# Patient Record
Sex: Female | Born: 1990 | Race: White | Hispanic: Yes | Smoking: Current some day smoker
Health system: Southern US, Community
[De-identification: ages and names within clinical notes are randomized; demographics above are authoritative.]

## PROBLEM LIST (undated history)

## (undated) DIAGNOSIS — N390 Urinary tract infection, site not specified: Secondary | ICD-10-CM

## (undated) DIAGNOSIS — F32A Depression, unspecified: Secondary | ICD-10-CM

## (undated) DIAGNOSIS — Z862 Personal history of diseases of the blood and blood-forming organs and certain disorders involving the immune mechanism: Secondary | ICD-10-CM

## (undated) DIAGNOSIS — F4310 Post-traumatic stress disorder, unspecified: Secondary | ICD-10-CM

## (undated) DIAGNOSIS — K5900 Constipation, unspecified: Secondary | ICD-10-CM

## (undated) DIAGNOSIS — D689 Coagulation defect, unspecified: Secondary | ICD-10-CM

## (undated) DIAGNOSIS — I2510 Atherosclerotic heart disease of native coronary artery without angina pectoris: Secondary | ICD-10-CM

## (undated) DIAGNOSIS — F329 Major depressive disorder, single episode, unspecified: Secondary | ICD-10-CM

## (undated) DIAGNOSIS — Z720 Tobacco use: Secondary | ICD-10-CM

## (undated) DIAGNOSIS — I513 Intracardiac thrombosis, not elsewhere classified: Secondary | ICD-10-CM

## (undated) DIAGNOSIS — J45909 Unspecified asthma, uncomplicated: Secondary | ICD-10-CM

## (undated) HISTORY — PX: CARDIAC SURGERY: SHX584

## (undated) HISTORY — PX: CHOLECYSTECTOMY: SHX55

## (undated) HISTORY — PX: OTHER SURGICAL HISTORY: SHX169

---
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
## 2010-06-06 ENCOUNTER — Ambulatory Visit: Payer: Self-pay | Admitting: Obstetrics & Gynecology

## 2010-06-07 ENCOUNTER — Encounter: Payer: Self-pay | Admitting: Obstetrics & Gynecology

## 2010-06-07 LAB — CONVERTED CEMR LAB: GC Probe Amp, Genital: NEGATIVE

## 2010-06-15 ENCOUNTER — Ambulatory Visit: Payer: Self-pay | Admitting: Physician Assistant

## 2010-07-20 ENCOUNTER — Ambulatory Visit: Payer: Self-pay | Admitting: Physician Assistant

## 2010-12-21 ENCOUNTER — Other Ambulatory Visit
Admission: RE | Admit: 2010-12-21 | Discharge: 2010-12-21 | Payer: Self-pay | Source: Home / Self Care | Admitting: Obstetrics & Gynecology

## 2010-12-21 ENCOUNTER — Ambulatory Visit
Admission: RE | Admit: 2010-12-21 | Discharge: 2010-12-21 | Payer: Self-pay | Source: Home / Self Care | Attending: Family | Admitting: Family

## 2011-01-02 ENCOUNTER — Ambulatory Visit: Admit: 2011-01-02 | Payer: Self-pay | Admitting: Obstetrics & Gynecology

## 2011-02-04 ENCOUNTER — Ambulatory Visit: Payer: Medicaid Other

## 2011-02-04 ENCOUNTER — Encounter: Payer: Self-pay | Admitting: Physician Assistant

## 2011-02-04 DIAGNOSIS — E282 Polycystic ovarian syndrome: Secondary | ICD-10-CM

## 2011-02-04 DIAGNOSIS — Z30432 Encounter for removal of intrauterine contraceptive device: Secondary | ICD-10-CM

## 2011-02-04 DIAGNOSIS — Z30431 Encounter for routine checking of intrauterine contraceptive device: Secondary | ICD-10-CM

## 2011-02-04 LAB — CONVERTED CEMR LAB
Clue Cells Wet Prep HPF POC: NONE SEEN
Trich, Wet Prep: NONE SEEN

## 2011-02-05 ENCOUNTER — Encounter: Payer: Self-pay | Admitting: Physician Assistant

## 2011-02-05 LAB — CONVERTED CEMR LAB: GC Probe Amp, Genital: NEGATIVE

## 2011-02-10 ENCOUNTER — Inpatient Hospital Stay (HOSPITAL_COMMUNITY): Payer: Medicaid Other

## 2011-02-10 ENCOUNTER — Inpatient Hospital Stay (HOSPITAL_COMMUNITY)
Admission: AD | Admit: 2011-02-10 | Discharge: 2011-02-10 | Disposition: A | Discharge status: Home / Self Care | Payer: Medicaid Other | Source: Ambulatory Visit | Attending: Obstetrics and Gynecology | Admitting: Obstetrics and Gynecology

## 2011-02-10 DIAGNOSIS — N83209 Unspecified ovarian cyst, unspecified side: Secondary | ICD-10-CM

## 2011-02-10 DIAGNOSIS — R109 Unspecified abdominal pain: Secondary | ICD-10-CM

## 2011-02-10 LAB — URINALYSIS, ROUTINE W REFLEX MICROSCOPIC
Leukocytes, UA: NEGATIVE
Nitrite: NEGATIVE
Protein, ur: NEGATIVE mg/dL
Specific Gravity, Urine: >1.03 — ABNORMAL HIGH (ref 1.005–1.030)
Urobilinogen, UA: 0.2 mg/dL (ref 0.0–1.0)
pH: 5.5 (ref 5.0–8.0)

## 2011-02-10 LAB — URINE MICROSCOPIC-ADD ON

## 2011-02-18 ENCOUNTER — Ambulatory Visit: Payer: Medicaid Other

## 2011-02-18 DIAGNOSIS — E282 Polycystic ovarian syndrome: Secondary | ICD-10-CM

## 2011-02-19 ENCOUNTER — Encounter: Payer: Self-pay | Admitting: Physician Assistant

## 2011-02-19 LAB — CONVERTED CEMR LAB
Prolactin: 10 ng/mL
TSH: 1.598 microintl units/mL (ref 0.350–4.500)

## 2011-03-18 ENCOUNTER — Other Ambulatory Visit: Payer: Self-pay | Admitting: Obstetrics & Gynecology

## 2011-03-18 ENCOUNTER — Ambulatory Visit: Payer: Medicaid Other

## 2011-03-18 DIAGNOSIS — R102 Pelvic and perineal pain: Secondary | ICD-10-CM

## 2011-03-18 DIAGNOSIS — N949 Unspecified condition associated with female genital organs and menstrual cycle: Secondary | ICD-10-CM

## 2011-03-19 NOTE — Assessment & Plan Note (Signed)
[name], [name]              ACCOUNT NO.:  [id number]  MEDICAL RECORD NO.:  [id number]           PATIENT TYPE:  LOCATION:  [facility] [place]           FACILITY:  PHYSICIAN:  Maylon Cos, CNM    DATE OF BIRTH:  06/11/1991  DATE OF SERVICE:  03/18/2011                                 CLINIC NOTE  Reason for today's visit is followup and diagnosis of PCOS and complaint of worsening pain on left side.  HISTORY OF PRESENT ILLNESS:  The patient is a 20 year old [place] American female who is a gravida 1, para 1-0-0-1 who is in our office last approximately 1 month ago for followup for recent diagnosis of PCOS.  At that time of that visit, she was placed on metformin 500 mg and instructed on how to taper up to 1500 mg a day.  The patient was unable to tolerate 1500 mg and is currently taking 500 twice a day with minimal side effects.  Labs that were drawn at her last visit did show glucose intolerance and normal TSH at 1.5, normal prolactin level, normal FSH, and LH.  The patient complains that despite medications that she is still complaining of pain on her left side that is every day.  She is taking Vicoprofen twice a day every day.  She states that pain is worse with  __________.  She describes it as a sharp pain below the left going area that has an ache that radiates to her back.  She was previously evaluated for pelvic pain in maternity admissions on February 09, 2011, and was seen to have multiple follicles on her right ovary which was measuring 2.8 x 1.6 x 3.0, and her left ovary was measuring 3.3 x 2 x 2.6 and contained an oblong 1.9 cm dominant follicle versus collapsing cysts.  The patient is in much desire for cervical removal of the questionable cyst on her left ovary.  We have discussed today that these types of cyst are not likely in need of surgical intervention.  She is currently on Depo-Provera and we have discussed that the better management options could  possibly be oral contraceptive pills.  She does not desire changing from Depo-Provera to oral contraceptive pills at this time and does request to discuss other options to include surgery.  We discussed today that we will repeat a pelvic ultrasound approximately 6 weeks from the first to see if the cyst is still there or larger and have her return in approximately 1 week after the ultrasound to discuss with one of the surgeons additionally, we have discussed the method of physical therapy as a treatment modality for pelvic pain and she is amenable to this.  If the surgeon also feels that she is not a candidate for surgical intervention.  On examination, [name] is a pleasant [place] American female in no apparent distress.  Her vital signs are within normal limits.  Her pulse is 85.  Her blood pressure is 109/62.  Her weight is 212 which is actually 4 pounds less than what she was here previously approximately 4 weeks ago.  HEENT exam is grossly normal.  She is appropriate during our exam.  Her abdomen is soft and nontender.  She  does have mild discomfort in her lower left quadrant and her groin area.  She declines bimanual pelvic exam today for further assessment of ovarian enlargement.  Extremities are equal and reactive.  ASSESSMENT: 1. Pelvic pain. 2. Polycystic ovarian syndrome.  PLAN: 1. The patient should she continue her metformin as well as her carb     modified diet and increased exercise for weight loss. 2. Pelvic ultrasound next week to assess resolution or increase in     previously seen follicles and ovarian cyst. 3. The patient should follow up with one of the physicians at her next     visit to review her ultrasound.  Her complaints and other treatment     modalities.          ______________________________ Maylon Cos, CNM    SS/MEDQ  D:  03/18/2011  T:  03/19/2011  Job:  [id number]

## 2011-03-25 ENCOUNTER — Other Ambulatory Visit: Payer: Medicaid Other

## 2011-03-26 NOTE — Assessment & Plan Note (Signed)
[name], [name]              ACCOUNT NO.:  [id number]  MEDICAL RECORD NO.:  [id number]           PATIENT TYPE:  LOCATION:  [facility] [place]           FACILITY:  PHYSICIAN:  Maylon Cos, CNM    DATE OF BIRTH:  [birth year]  DATE OF SERVICE:  02/18/2011                                 CLINIC NOTE  The patient is being seen at [facility] [place].  REASON FOR TODAY'S VISIT:  Followup for diagnosis of PCOS.  HISTORY OF PRESENT ILLNESS:  The patient is a 20 year old gravida 1, para 1-0-0-1 who was in our office on February 04, 2011, for an IUD removal.  At the time of her visit, she had a pelvic ultrasound for placement of her IUD prior to this removal and it was discovered that she had at that time multiple follicles on her right ovary and a fluid- filled cyst on her left ovary.  The patient subsequently decided that she started having irregular bleeding and pelvic pain with the Mirena IUD, it was removed on February 04, 2011, and she received initial shot of Depo-Provera.  The patient followed up in Maternity Admissions at [facility] on February 10, 2011, with complaints of abdominal pain. She had a pelvic ultrasound at that visit, which also identified right ovary containing multiple follicles and a left ovary that was enlarged with collapsing cysts.  She returns today for followup of previously discussed diagnosis of suspected polycystic ovarian syndrome.  She states that she is having continued abdominal pain that is relieved mildly by her combination of Vicodin and ibuprofen together.  She is not having any abnormal bleeding.  She does complain of having increased moodiness and emotional sensitivity since receiving her Depo-Provera.  PHYSICAL EXAMINATION:  [name] is a pleasant [place] American female who appears to be her stated age.  HEENT exam is grossly normal.  Positive for hirsutism on her chin.  Vital signs are stable; pulse 80,  blood pressure is 106/66, her weight is 215, and her height is 62-1/2 inches. Neuro grossly intact and appropriate during discussion and examination.  ASSESSMENT:  Probable polycystic ovarian syndrome.  PLAN: 1. I will draw the labs to evaluate for PCOS today; TSH, FSH, LH,     prolactin, and we are getting a random blood sugar. 2. I have discussed treatment options with this patient.  She does     desire to start metformin starting 500 mg p.o. at bedtime x7 days     then increasing to 500 mg p.o. twice a day x7 days with the goal of     increasing to 500 mg t.i.d. for 1500 mg daily dose.  She has been     instructed about possible side effects with the metformin.  She is     also given written literature concerning PCOS and diabetic-type     diet.  FOLLOWUP:  The patient should follow up in to 3 months for revisit or p.r.n. problems.          ______________________________ Maylon Cos, CNM    SS/MEDQ  D:  02/18/2011  T:  02/19/2011  Job:  [id number]

## 2011-04-02 ENCOUNTER — Ambulatory Visit: Payer: Medicaid Other | Admitting: Obstetrics & Gynecology

## 2011-04-02 DIAGNOSIS — N83209 Unspecified ovarian cyst, unspecified side: Secondary | ICD-10-CM

## 2011-04-02 DIAGNOSIS — E282 Polycystic ovarian syndrome: Secondary | ICD-10-CM

## 2011-04-04 NOTE — Assessment & Plan Note (Signed)
[name], [name]              ACCOUNT NO.:  [id number]  MEDICAL RECORD NO.:  [id number]           PATIENT TYPE:  LOCATION:  [facility] [place]           FACILITY:  PHYSICIAN:  Barbara Lincoln, MD      DATE OF BIRTH:  [birth year]  DATE OF SERVICE:  04/02/2011                                 CLINIC NOTE  The patient is a 20 year old female with PCOS who wants to discuss options to protect her endometrium and protect from developing cysts. She did have an ultrasound done at [facility] [place] on February 10, 2011, which showed normal uterus, anteverted, endometrial lining was 4 mm, the right ovary contained multiple follicles but no cyst or mass, left ovary contained an oblong 1.9 x 1.5 x 1.9 dominant follicle versus collapsing cyst.  This is a normal pelvic ultrasound.  The patient has been on Mirena in the past and Depo-Provera and states she has not had any cycles.  I told her this is fine as long as she was having a medically-induced amenorrhea versus a year-long amenorrhea without medicines.  The patient does not like the way the Depo makes her feel and she is interested in going back on the birth control pills.  She was on birth control pill in the past.  She does not have a history of clots in her legs or lungs, and has no contraindication that I can see from being on estrogen-containing birth control.  We replaced her on Ortho- Cyclen and she is to start this May 21, which would be at the end of the 12 weeks of Depo.  The patient will come back in 6 months for followup.  ASSESSMENT AND PLAN:  A 20 year old female with polycystic ovarian syndrome, needing birth control. 1. Normal pelvic ultrasound. 2. Start Ortho-Cyclen on May 21, which is the end of her Depo. 3. Return to clinic in 6 months to evaluate bleeding profile.          ______________________________ Barbara Lincoln, MD    KL/MEDQ  D:  04/02/2011  T:  04/03/2011  Job:  [id number]

## 2011-04-23 NOTE — Assessment & Plan Note (Signed)
[name], [name]              ACCOUNT NO.:  [id number]   MEDICAL RECORD NO.:  [id number]          PATIENT TYPE:  POB   LOCATION:  [facility] [place]         FACILITY:  [facility]   PHYSICIAN:  Maylon Cos, CNM    DATE OF BIRTH:  [birth year]   DATE OF SERVICE:  07/20/2010                                  CLINIC NOTE   The patient presents to the office at [facility], reason for today's  visit is IUD string check.   HISTORY OF PRESENT ILLNESS:  The patient had a Mirena IUD placed on June 15, 2010 in our office and returns today for her normal 5-6 weeks check.  Her complaints are only continued vaginal spotting that she complains  her state rather that it is a pink in nature, does not happen everyday,  but most of the days of other week.  It does require her wear a pad or  panty line and she would like for that.  She denies pain with  intercourse, abdominal pain or bleeding after intercourse.  Overall, she  is very happy with the Mirena.   PHYSICAL EXAMINATION:  GENERAL:  [name] is a pleasant [place] female in no  apparent distress.  VITAL SIGNS:  Stable.  Pulse is 85, blood pressure is 99/60.  Her weight  is 202.  Her height is 62 inches.  ABDOMEN:  Soft and nontender.  No masses or lesions noted.  PELVIC:  IUD strings are easily visualized.  On speculum exam, there is  a scant amount of creamy brown discharge noted at the os and in the  vaginal vault.  Bimanual exam, no cervical motion tenderness.  Uterus is  not enlarged and nontender.  Adnexa are not enlarged and nontender.  EXTREMITIES:  Reactive, full range of motion without edema.   ASSESSMENT:  1. Surveillance of Mirena intrauterine device.  2. Dysfunctional uterine bleeding secondary to Mirena intrauterine      device.   PLAN:  1. I am given the patient 62-month sample of Ortho Tri-Cyclen Lo to aid      stopping her dysfunctional uterine bleeding related to the Mirena,      she should only need 1 month of pills.  2.  The patient needs to follow up in January for annual exam and she      will then be 19 to initiate Pap smears or the patient can return      p.r.n. problems.           ______________________________  Maylon Cos, CNM     SS/MEDQ  D:  07/20/2010  T:  07/20/2010  Job:  [id number]

## 2011-04-23 NOTE — Assessment & Plan Note (Signed)
[name], [name]              ACCOUNT NO.:  [id number]   MEDICAL RECORD NO.:  [id number]          PATIENT TYPE:  POB   LOCATION:  [facility] [place]         FACILITY:  [facility]   PHYSICIAN:  Sid Falcon, CNM  DATE OF BIRTH:  13-Oct-1991   DATE OF SERVICE:                                  CLINIC NOTE   REASON FOR VISIT:  Well-woman exam.   HISTORY OF PRESENT ILLNESS:  The patient is here with reports of  desiring well-woman exam.  Reports positive gonorrhea in December 2011,  treated at [facility].  Obtained the infection by previous  partner and did not have sex with him after treatment, with a new  partner at this time.  No new medical diagnosis of family members.  The  patient here also with reports of partner is feeling the IUD strings  during intercourse.   PHYSICAL EXAMINATION:  VITAL SIGNS:  Stable, pulse 76, blood pressure  104/64, and weight 216.  GENERAL:  The patient is alert and oriented x3.  No signs of acute  distress.  NECK:  No thyromegaly.  No dominant masses, nontender with palpation.  CHEST:  Cardiovascular system regular rate and rhythm without murmurs,  gallops, or rubs.  LUNGS:  Clear to auscultation bilaterally.  BREASTS:  Soft, nontender, no dominant masses.  No nipple discharge.  No  retractions.  No dimpling bilaterally.  ABDOMEN:  Obese.  PELVIS:  Vagina, no abnormal discharge.  No abnormal lesions noted.  Cervix is slightly yellowish type discharge seen from os.  Cervical  motion tenderness is negative.  No abnormal lesions.  Uterus and adnexa  difficult to palpate secondary to weight.  IUD strings seen without  difficulty.  Strings cut approximately 1 cm x2.  Pelvic ultrasound, IUD  seen in place.   ASSESSMENT:  1. Well-woman exam.  2. Intrauterine device maintenance surveillance.   PLAN:  Send Chlamydia and gonorrhea and Pap smear to lab.  The patient  advised that the IUD is in normal position and if she is continue to  have  discomfort during intercourse to follow up with the office.      Sid Falcon, CNM     WM/MEDQ  D:  12/21/2010  T:  12/22/2010  Job:  [id number]

## 2011-04-23 NOTE — Assessment & Plan Note (Signed)
[name], [name]              ACCOUNT NO.:  [id number]   MEDICAL RECORD NO.:  [id number]          PATIENT TYPE:  POB   LOCATION:  [facility] [place]         FACILITY:  [facility]   PHYSICIAN:  Maylon Cos, CNM    DATE OF BIRTH:  06-28-1991   DATE OF SERVICE:  06/15/2010                                  CLINIC NOTE   The patient is being seen at the [facility],  [place].   REASON FOR TODAY'S VISIT:  Mirena IUD insertion.   HISTORY OF PRESENT ILLNESS:  The patient is a 20 year old single [place]-  American G1, P 1-0-60-73 with a 35-year-old daughter.  She comes today for  IUD insertion approximately 1-2 weeks after her counseling with Dr.  Marice Potter.  She is not currently in a sexual relationship.  However, she has  no desire for fertility at this time.  Gonorrhea and Chlamydia genital  probes that were done on June 29 are both negative.  Mirena IUD consent  was discussed and signed with the patient and she was given 800 mg of  ibuprofen p.o. prior to the procedure.   Physical exam, Chesney is a pleasant [place] female who appears to be in no  apparent distress.  She appears to be older than her stated age of 23.  Her vital signs are stable.  Her pulse is 78.  Her blood pressure is  114/72.  Her weight is 193.  Her height is 62-1/2 inches.   PROCEDURE:  Grave speculum was inserted inside the vagina to visualize  the cervix which is parous and a scant amount of cervical mucus  discharge was removed.  Cervix was then cleansed twice using Betadine on  Fox swab.  Tenaculum was placed at approximately 2 o'clock on the  cervix.  Uterus sounded to 8 cm.  Mirena IUD was calibrated to such and  the Mirena was placed without difficulty.  Strings were trimmed to  approximately 1.5 cm below the external os and the tenaculum was removed  from the cervix.  Bleeding was controlled with a Fox swab from the side  of the tenaculum and the speculum was removed from vagina.  The  patient  tolerated this procedure well.   ASSESSMENT:  Mirena intrauterine device insertion.   PLAN:  Mirena IUD side effects reviewed again with the patient prior to  leaving office, expected early spotting side effects reviewed again.  Encouraged the patient to take ibuprofen up to 800 mg q.8 h. p.r.n. as  needed for cramping for the next 2-3 days after insertion.  The patient  should follow up in 5-6 weeks or with the next period for IUD check or  p.r.n. problems.           ______________________________  Maylon Cos, CNM     SS/MEDQ  D:  06/15/2010  T:  06/15/2010  Job:  [id number]

## 2011-04-23 NOTE — Assessment & Plan Note (Signed)
NAMEYANNIS, GUMBS              ACCOUNT NO.:  [id number]   MEDICAL RECORD NO.:  [id number]          PATIENT TYPE:  POB   LOCATION:  [facility] [place]         FACILITY:  [facility]   PHYSICIAN:  Allie Bossier, MD        DATE OF BIRTH:  09-14-1991   DATE OF SERVICE:                                  CLINIC NOTE   Ms. Hagenow is a 20 year old single [place] American who is a G1, P15 with  a 42-year-old daughter, who comes in here to discuss birth control  options and she desires testing for GC and Chlamydia.  She says she has  had a vaginal discharge that occurs just before her period.  She said  she has severe dysmenorrhea and has to take Motrin for that.  Review of  systems is significant for coitarche being at age 3, 67 partners since  that time.  She works at [employer].  She does use condoms regularly.  She  says she bleeds 5-7 days per month and has donated blood recently in  January 2011.  On physical exam, her cervix is parous.  She has normal  discharge.  No cervical motion tenderness.  Her uterus is upper limits  of normal for size, it is midplane, nontender and adnexa are nontender.  No masses.   ASSESSMENT AND PLAN:  1. Desires sexually transmitted disease check.  I have done GC and      Chlamydia today.  I have offered her blood-borne screening and she      declines that today.  2. Severe dysmenorrhea and heavy periods and desire for birth control.      After discussion of the option, she would like a Mirena.  A handout      has been given on Mirena.  She will schedule this for next week.  I      have instructed her to not have sex till then (she is not currently      in a relationship) and we will see her back in next week.      Allie Bossier, MD     MCD/MEDQ  D:  06/06/2010  T:  06/07/2010  Job:  [id number]
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
## 2011-05-03 NOTE — Assessment & Plan Note (Signed)
[name], [name]              ACCOUNT NO.:  [id number]  MEDICAL RECORD NO.:  [id number]          PATIENT TYPE:  POB  LOCATION:  [facility] [place]         FACILITY:  [facility]  PHYSICIAN:  Maylon Cos, CNM    DATE OF BIRTH:  1991-03-25  DATE OF SERVICE:  02/04/2011                                 CLINIC NOTE  REASON FOR TODAY'S VISIT:  IUD check.  HISTORY OF PRESENT ILLNESS:  The patient is a 20 year old [place] American gravida 1, para 1-0-0-1 who had a Mirena IUD placed in July 2011 for contraception.  She returns today with continued complaints of intermittent left-sided pain that she describes as sharp in nature.  She is taking ibuprofen 600 mg on a regular basis when she has this pain that does not relieve her discomfort.  She has also had ongoing complaints of irregular bleeding with her IUD.  She was given a pack of Ortho Tri-Cyclen Lo in August 2011, to help with dysfunctional bleeding related to the Mirena.  She took 1 pack of pills with short-term resolution.  She was seen for well-woman exam in January 2012, still complaining of intermittent pain and abnormal bleeding.  Pelvic ultrasound was performed at that time to confirm normal IUD positioning and strings were also trimmed at that visit.  She returns today, saying that she had a first ever last menstrual period was approximately 2 weeks ago, it lasted for 2 weeks.  She had intermittent cramping and sharp pain, mainly on her left side, again unrelieved by ibuprofen.  She presents today for IUD check and possible removal.  PHYSICAL EXAMINATION:  GENERAL:  [name] is a pleasant 20 year old, in no apparent distress. She has an obese abdomen and positive for no apparent facial hair or hirsutism on general exam. VITAL SIGNS:  Stable.  Her pulse is 82, her blood pressure is 122/76, her weight is 216, her height is 62-1/2 inches. PELVIC:  IUD strings are easily visualized using Graves speculum.  Her cervix is parous.  It  is nonfriable.  Bimanual exam does reveal nonenlarged uterus, enlarged left adnexal with positive tenderness to palpation.  Right ovary and adnexa, does have mild tenderness to palpation but no enlargement is appreciated.  Pelvic ultrasound performed in office does reveal the right ovary to have multiple dominant follicles and a left ovary to have a fluid-filled cyst, measuring 1.5x 1.6 cm.  Based on the findings of the today's ultrasound and examination, the patient does state that she desires to have her IUD removed that was performed during the examination, that was removed without difficulty intact.  ASSESSMENT: 1. Intrauterine device removal. 2. Likely polycystic ovarian syndrome. 3. Contraceptive management. 4. Dysfunctional uterine bleeding.  PLAN: 1. IUD was removed during today's visit as described above without     difficulty. 2. I discussed with the patient findings of her ultrasound today and     given her clinical picture, she likely has a diagnosis of     polycystic ovarian syndrome.  I have given her written literature     on this and also diabetic diet and treatment method.  The patient     is unable to afford lab work today given that she has family  planning Medicaid.  I have discussed with her at length treatment     options and possible need for metformin in the future.  She is to     review the literature given today and will return in 2 weeks for     further evaluation. 3. The patient will receive Depo-Provera 150 mg IM today for ongoing     contraception and possible treatment of her dysfunctional uterine     bleeding.  The patient is to return in 2 weeks for further     discussion of diagnosis of polycystic ovarian syndrome.  She is     encouraged diet and exercise to include diabetic diet and will     consider metformin in the future when she is not on Depo-Provera.          ______________________________ Maylon Cos, CNM    SS/MEDQ  D:   02/04/2011  T:  02/05/2011  Job:  [id number]

## 2011-07-12 ENCOUNTER — Ambulatory Visit: Payer: Medicaid Other

## 2011-07-12 DIAGNOSIS — N949 Unspecified condition associated with female genital organs and menstrual cycle: Secondary | ICD-10-CM

## 2011-10-23 DIAGNOSIS — I82402 Acute embolism and thrombosis of unspecified deep veins of left lower extremity: Secondary | ICD-10-CM | POA: Insufficient documentation

## 2011-10-23 DIAGNOSIS — Z86718 Personal history of other venous thrombosis and embolism: Secondary | ICD-10-CM | POA: Insufficient documentation

## 2012-12-09 DIAGNOSIS — I513 Intracardiac thrombosis, not elsewhere classified: Secondary | ICD-10-CM

## 2012-12-09 HISTORY — DX: Intracardiac thrombosis, not elsewhere classified: I51.3

## 2012-12-09 HISTORY — PX: CARDIAC SURGERY: SHX584

## 2013-03-23 DIAGNOSIS — Z8742 Personal history of other diseases of the female genital tract: Secondary | ICD-10-CM | POA: Insufficient documentation

## 2013-05-04 DIAGNOSIS — R002 Palpitations: Secondary | ICD-10-CM | POA: Insufficient documentation

## 2013-09-24 DIAGNOSIS — K8020 Calculus of gallbladder without cholecystitis without obstruction: Secondary | ICD-10-CM

## 2013-09-24 HISTORY — DX: Calculus of gallbladder without cholecystitis without obstruction: K80.20

## 2013-11-29 DIAGNOSIS — I82409 Acute embolism and thrombosis of unspecified deep veins of unspecified lower extremity: Secondary | ICD-10-CM | POA: Insufficient documentation

## 2014-02-10 DIAGNOSIS — Z5181 Encounter for therapeutic drug level monitoring: Secondary | ICD-10-CM | POA: Insufficient documentation

## 2014-07-20 DIAGNOSIS — E282 Polycystic ovarian syndrome: Secondary | ICD-10-CM | POA: Insufficient documentation

## 2014-07-20 DIAGNOSIS — G43909 Migraine, unspecified, not intractable, without status migrainosus: Secondary | ICD-10-CM | POA: Insufficient documentation

## 2014-12-06 DIAGNOSIS — D75839 Thrombocytosis, unspecified: Secondary | ICD-10-CM | POA: Insufficient documentation

## 2014-12-09 DIAGNOSIS — Q2112 Patent foramen ovale: Secondary | ICD-10-CM

## 2014-12-09 HISTORY — DX: Patent foramen ovale: Q21.12

## 2015-10-10 DIAGNOSIS — W540XXA Bitten by dog, initial encounter: Secondary | ICD-10-CM

## 2015-10-10 HISTORY — DX: Bitten by dog, initial encounter: W54.0XXA

## 2016-04-16 ENCOUNTER — Encounter: Payer: Self-pay | Admitting: Emergency Medicine

## 2016-04-16 ENCOUNTER — Emergency Department (INDEPENDENT_AMBULATORY_CARE_PROVIDER_SITE_OTHER)
Admission: EM | Admit: 2016-04-16 | Discharge: 2016-04-16 | Disposition: A | Discharge status: Home / Self Care | Payer: Self-pay | Source: Home / Self Care | Attending: Emergency Medicine | Admitting: Emergency Medicine

## 2016-04-16 DIAGNOSIS — L600 Ingrowing nail: Secondary | ICD-10-CM

## 2016-04-16 MED ORDER — OXYCODONE-ACETAMINOPHEN 5-325 MG PO TABS: 2.0000 | ORAL_TABLET | ORAL | Status: DC | PRN

## 2016-04-16 MED ORDER — SULFAMETHOXAZOLE-TRIMETHOPRIM 800-160 MG PO TABS: 1.0000 | ORAL_TABLET | Freq: Two times a day (BID) | ORAL | Status: AC

## 2016-04-16 NOTE — ED Notes (Signed)
Pt c/o ingrown toenail in right big toe. Red and painful.

## 2016-04-16 NOTE — Discharge Instructions (Signed)
Ingrown Toenail  An ingrown toenail occurs when the corner or sides of your toenail grow into the surrounding skin. The big toe is most commonly affected, but it can happen to any of your toes. If your ingrown toenail is not treated, you will be at risk for infection.  CAUSES  This condition may be caused by:  · Wearing shoes that are too small or tight.  · Injury or trauma, such as stubbing your toe or having your toe stepped on.  · Improper cutting or care of your toenails.  · Being born with (congenital) nail or foot abnormalities, such as having a nail that is too big for your toe.  RISK FACTORS  Risk factors for an ingrown toenail include:  · Age. Your nails tend to thicken as you get older, so ingrown nails are more common in older people.  · Diabetes.  · Cutting your toenails incorrectly.  · Blood circulation problems.  SYMPTOMS  Symptoms may include:  · Pain, soreness, or tenderness.  · Redness.  · Swelling.  · Hardening of the skin surrounding the toe.  Your ingrown toenail may be infected if there is fluid, pus, or drainage.  DIAGNOSIS   An ingrown toenail may be diagnosed by medical history and physical exam. If your toenail is infected, your health care provider may test a sample of the drainage.  TREATMENT  Treatment depends on the severity of your ingrown toenail. Some ingrown toenails may be treated at home. More severe or infected ingrown toenails may require surgery to remove all or part of the nail. Infected ingrown toenails may also be treated with antibiotic medicines.  HOME CARE INSTRUCTIONS  · If you were prescribed an antibiotic medicine, finish all of it even if you start to feel better.  · Soak your foot in warm soapy water for 20 minutes, 3 times per day or as directed by your health care provider.  · Carefully lift the edge of the nail away from the sore skin by wedging a small piece of cotton under the corner of the nail. This may help with the pain.  Be careful not to cause more injury  to the area.  · Wear shoes that fit well. If your ingrown toenail is causing you pain, try wearing sandals, if possible.  · Trim your toenails regularly and carefully. Do not cut them in a curved shape. Cut your toenails straight across. This prevents injury to the skin at the corners of the toenail.  · Keep your feet clean and dry.  · If you are having trouble walking and are given crutches by your health care provider, use them as directed.  · Do not pick at your toenail or try to remove it yourself.  · Take medicines only as directed by your health care provider.  · Keep all follow-up visits as directed by your health care provider. This is important.  SEEK MEDICAL CARE IF:  · Your symptoms do not improve with treatment.  SEEK IMMEDIATE MEDICAL CARE IF:  · You have red streaks that start at your foot and go up your leg.  · You have a fever.  · You have increased redness, swelling, or pain.  · You have fluid, blood, or pus coming from your toenail.     This information is not intended to replace advice given to you by your health care provider. Make sure you discuss any questions you have with your health care provider.     Document Released:   11/22/2000 Document Revised: 04/11/2015 Document Reviewed: 10/19/2014  Elsevier Interactive Patient Education ©2016 Elsevier Inc.

## 2016-04-16 NOTE — ED Provider Notes (Signed)
CSN: [id number]     Arrival date & time 04/16/16  1953 History   First MD Initiated Contact with Patient 04/16/16 2001     Chief Complaint  Patient presents with  . Ingrown Toenail   (Consider location/radiation/quality/duration/timing/severity/associated sxs/prior Treatment) Patient is a 25 y.o. female presenting with toe pain. The history is provided by the patient. No language interpreter was used.  Toe Pain This is a new problem. The current episode started more than 2 days ago. The problem occurs constantly. The problem has been gradually worsening. Associated symptoms comments: swelling. Nothing aggravates the symptoms. Nothing relieves the symptoms. The treatment provided no relief.  Pt complains of ingrown nail with swelling and drainage.  Pt has had procedures on both toenail   History reviewed. No pertinent past medical history. History reviewed. No pertinent past surgical history. History reviewed. No pertinent family history. Social History  Substance Use Topics  . Smoking status: Former Games developermoker  . Smokeless tobacco: None  . Alcohol Use: No   OB History    No data available     Review of Systems  All other systems reviewed and are negative.   Allergies  Morphine and related; Topamax; Toradol; and Vicodin  Home Medications   Prior to Admission medications   Medication Sig Start Date End Date Taking? Authorizing Provider  oxyCODONE-acetaminophen (PERCOCET/ROXICET) 5-325 MG tablet Take 2 tablets by mouth every 4 (four) hours as needed for severe pain. 04/16/16   Elson AreasLeslie K Sofia, PA-C  sulfamethoxazole-trimethoprim (BACTRIM DS,SEPTRA DS) 800-160 MG tablet Take 1 tablet by mouth 2 (two) times daily. 04/16/16 04/23/16  Elson AreasLeslie K Sofia, PA-C   Meds Ordered and Administered this Visit  Medications - No data to display  BP 116/82 mmHg  Pulse 96  Temp(Src) 98.3 F (36.8 C) (Oral)  Wt 233 lb (105.688 kg)  SpO2 96% No data found.   Physical Exam  Constitutional: She is  oriented to person, place, and time. She appears well-developed and well-nourished.  Musculoskeletal: She exhibits tenderness.  Right 1st toe, erythema, swelling,  Small amount of drainage.     Neurological: She is alert and oriented to person, place, and time. She has normal reflexes.  Skin: Skin is warm.  Psychiatric: She has a normal mood and affect.  Nursing note and vitals reviewed.   ED Course  Procedures (including critical care time)  Labs Review Labs Reviewed - No data to display  Imaging Review No results found.   Visual Acuity Review  Right Eye Distance:   Left Eye Distance:   Bilateral Distance:    Right Eye Near:   Left Eye Near:    Bilateral Near:         MDM   1. Ingrown right big toenail    Meds ordered this encounter  Medications  . oxyCODONE-acetaminophen (PERCOCET/ROXICET) 5-325 MG tablet    Sig: Take 2 tablets by mouth every 4 (four) hours as needed for severe pain.    Dispense:  12 tablet    Refill:  0    Order Specific Question:  Supervising Provider    Answer:  Georgina PillionMASSEY, DAVID [[id number]]  . sulfamethoxazole-trimethoprim (BACTRIM DS,SEPTRA DS) 800-160 MG tablet    Sig: Take 1 tablet by mouth 2 (two) times daily.    Dispense:  14 tablet    Refill:  0    Order Specific Question:  Supervising Provider    Answer:  Georgina PillionMASSEY, DAVID [[id number]]  An After Visit Summary was printed and given to the patient.  Lonia Skinner Deering, PA-C 04/16/16 2021

## 2016-07-07 ENCOUNTER — Encounter (HOSPITAL_COMMUNITY): Payer: Self-pay | Admitting: Emergency Medicine

## 2016-07-07 ENCOUNTER — Inpatient Hospital Stay (HOSPITAL_COMMUNITY)
Admission: EM | Admit: 2016-07-07 | Discharge: 2016-07-10 | DRG: 313 | Disposition: A | Discharge status: Home / Self Care | Payer: Self-pay | Attending: Internal Medicine | Admitting: Internal Medicine

## 2016-07-07 ENCOUNTER — Emergency Department (HOSPITAL_COMMUNITY): Payer: Self-pay

## 2016-07-07 DIAGNOSIS — Z881 Allergy status to other antibiotic agents status: Secondary | ICD-10-CM

## 2016-07-07 DIAGNOSIS — F4310 Post-traumatic stress disorder, unspecified: Secondary | ICD-10-CM

## 2016-07-07 DIAGNOSIS — Z6841 Body Mass Index (BMI) 40.0 and over, adult: Secondary | ICD-10-CM

## 2016-07-07 DIAGNOSIS — Z885 Allergy status to narcotic agent status: Secondary | ICD-10-CM

## 2016-07-07 DIAGNOSIS — Z86718 Personal history of other venous thrombosis and embolism: Secondary | ICD-10-CM

## 2016-07-07 DIAGNOSIS — R200 Anesthesia of skin: Secondary | ICD-10-CM

## 2016-07-07 DIAGNOSIS — I10 Essential (primary) hypertension: Secondary | ICD-10-CM | POA: Diagnosis present

## 2016-07-07 DIAGNOSIS — Z886 Allergy status to analgesic agent status: Secondary | ICD-10-CM

## 2016-07-07 DIAGNOSIS — Z888 Allergy status to other drugs, medicaments and biological substances status: Secondary | ICD-10-CM

## 2016-07-07 DIAGNOSIS — F329 Major depressive disorder, single episode, unspecified: Secondary | ICD-10-CM | POA: Diagnosis present

## 2016-07-07 DIAGNOSIS — F17210 Nicotine dependence, cigarettes, uncomplicated: Secondary | ICD-10-CM | POA: Diagnosis present

## 2016-07-07 DIAGNOSIS — R0789 Other chest pain: Secondary | ICD-10-CM

## 2016-07-07 DIAGNOSIS — I513 Intracardiac thrombosis, not elsewhere classified: Secondary | ICD-10-CM

## 2016-07-07 DIAGNOSIS — Z79899 Other long term (current) drug therapy: Secondary | ICD-10-CM

## 2016-07-07 DIAGNOSIS — D689 Coagulation defect, unspecified: Secondary | ICD-10-CM | POA: Diagnosis present

## 2016-07-07 DIAGNOSIS — G459 Transient cerebral ischemic attack, unspecified: Secondary | ICD-10-CM

## 2016-07-07 DIAGNOSIS — I213 ST elevation (STEMI) myocardial infarction of unspecified site: Secondary | ICD-10-CM

## 2016-07-07 DIAGNOSIS — Z9119 Patient's noncompliance with other medical treatment and regimen: Secondary | ICD-10-CM

## 2016-07-07 DIAGNOSIS — E785 Hyperlipidemia, unspecified: Secondary | ICD-10-CM | POA: Diagnosis present

## 2016-07-07 DIAGNOSIS — G8191 Hemiplegia, unspecified affecting right dominant side: Secondary | ICD-10-CM | POA: Diagnosis present

## 2016-07-07 DIAGNOSIS — I2510 Atherosclerotic heart disease of native coronary artery without angina pectoris: Secondary | ICD-10-CM | POA: Diagnosis present

## 2016-07-07 DIAGNOSIS — R112 Nausea with vomiting, unspecified: Secondary | ICD-10-CM | POA: Diagnosis present

## 2016-07-07 DIAGNOSIS — R079 Chest pain, unspecified: Principal | ICD-10-CM | POA: Diagnosis present

## 2016-07-07 DIAGNOSIS — F32A Depression, unspecified: Secondary | ICD-10-CM

## 2016-07-07 DIAGNOSIS — Z720 Tobacco use: Secondary | ICD-10-CM

## 2016-07-07 HISTORY — DX: Depression, unspecified: F32.A

## 2016-07-07 HISTORY — DX: Atherosclerotic heart disease of native coronary artery without angina pectoris: I25.10

## 2016-07-07 HISTORY — DX: Unspecified asthma, uncomplicated: J45.909

## 2016-07-07 HISTORY — DX: Post-traumatic stress disorder, unspecified: F43.10

## 2016-07-07 HISTORY — DX: Major depressive disorder, single episode, unspecified: F32.9

## 2016-07-07 HISTORY — DX: Tobacco use: Z72.0

## 2016-07-07 HISTORY — DX: Intracardiac thrombosis, not elsewhere classified: I51.3

## 2016-07-07 LAB — APTT: aPTT: 30 seconds (ref 24–36)

## 2016-07-07 LAB — I-STAT TROPONIN, ED
TROPONIN I, POC: 0 ng/mL (ref 0.00–0.08)
Troponin i, poc: 0 ng/mL (ref 0.00–0.08)

## 2016-07-07 LAB — CBC
HEMATOCRIT: 45.9 % (ref 36.0–46.0)
HEMOGLOBIN: 15.5 g/dL — AB (ref 12.0–15.0)
MCH: 28.9 pg (ref 26.0–34.0)
MCHC: 33.8 g/dL (ref 30.0–36.0)
MCV: 85.6 fL (ref 78.0–100.0)
Platelets: 378 10*3/uL (ref 150–400)
RBC: 5.36 MIL/uL — AB (ref 3.87–5.11)
RDW: 13.7 % (ref 11.5–15.5)
WBC: 7.7 10*3/uL (ref 4.0–10.5)

## 2016-07-07 LAB — BASIC METABOLIC PANEL
Anion gap: 10 (ref 5–15)
BUN: 6 mg/dL (ref 6–20)
CHLORIDE: 104 mmol/L (ref 101–111)
CO2: 21 mmol/L — AB (ref 22–32)
CREATININE: 0.72 mg/dL (ref 0.44–1.00)
Calcium: 8.9 mg/dL (ref 8.9–10.3)
GFR calc non Af Amer: >60 mL/min (ref >60)
Glucose, Bld: 100 mg/dL — ABNORMAL HIGH (ref 65–99)
POTASSIUM: 3.9 mmol/L (ref 3.5–5.1)
Sodium: 135 mmol/L (ref 135–145)

## 2016-07-07 LAB — I-STAT BETA HCG BLOOD, ED (MC, WL, AP ONLY)

## 2016-07-07 LAB — PROTIME-INR
INR: 1
PROTHROMBIN TIME: 13.2 s (ref 11.4–15.2)

## 2016-07-07 LAB — TROPONIN I: Troponin I: <0.03 ng/mL (ref <0.03)

## 2016-07-07 MED ORDER — ACETAMINOPHEN 325 MG PO TABS: 650.0000 mg | ORAL_TABLET | ORAL | Status: DC | PRN

## 2016-07-07 MED ORDER — PROMETHAZINE HCL 25 MG/ML IJ SOLN
25.0000 mg | Freq: Once | INTRAMUSCULAR | Status: AC
  Administered 2016-07-07: 25 mg via INTRAVENOUS
  Filled 2016-07-07: qty 1

## 2016-07-07 MED ORDER — ONDANSETRON HCL 4 MG/2ML IJ SOLN
4.0000 mg | Freq: Once | INTRAMUSCULAR | Status: AC
  Administered 2016-07-07: 4 mg via INTRAVENOUS
  Filled 2016-07-07: qty 2

## 2016-07-07 MED ORDER — OXYCODONE-ACETAMINOPHEN 5-325 MG PO TABS
2.0000 | ORAL_TABLET | ORAL | Status: DC | PRN
  Administered 2016-07-08 – 2016-07-10 (×9): 2 via ORAL
  Filled 2016-07-07 (×10): qty 2

## 2016-07-07 MED ORDER — HEPARIN (PORCINE) IN NACL 100-0.45 UNIT/ML-% IJ SOLN
1350.0000 [IU]/h | INTRAMUSCULAR | Status: DC
  Administered 2016-07-07: 1000 [IU]/h via INTRAVENOUS
  Administered 2016-07-08: 1200 [IU]/h via INTRAVENOUS
  Filled 2016-07-07 (×4): qty 250

## 2016-07-07 MED ORDER — FENTANYL CITRATE (PF) 100 MCG/2ML IJ SOLN
200.0000 ug | Freq: Once | INTRAMUSCULAR | Status: AC
  Administered 2016-07-07: 200 ug via INTRAVENOUS
  Filled 2016-07-07: qty 4

## 2016-07-07 MED ORDER — ONDANSETRON HCL 4 MG/2ML IJ SOLN
4.0000 mg | Freq: Three times a day (TID) | INTRAMUSCULAR | Status: DC | PRN
  Administered 2016-07-08 (×2): 4 mg via INTRAVENOUS
  Filled 2016-07-07 (×2): qty 2

## 2016-07-07 MED ORDER — HYDROMORPHONE HCL 1 MG/ML PO LIQD: 1.0000 mg | ORAL | Status: DC | PRN

## 2016-07-07 MED ORDER — HYDROMORPHONE HCL 2 MG PO TABS
1.0000 mg | ORAL_TABLET | ORAL | Status: DC | PRN
  Administered 2016-07-07: 1 mg via ORAL
  Filled 2016-07-07: qty 1

## 2016-07-07 MED ORDER — VILAZODONE HCL 20 MG PO TABS
20.0000 mg | ORAL_TABLET | Freq: Every day | ORAL | Status: DC
Start: 2016-07-08 — End: 2016-07-10
  Administered 2016-07-08 – 2016-07-10 (×3): 20 mg via ORAL
  Filled 2016-07-07 (×3): qty 1

## 2016-07-07 MED ORDER — IOPAMIDOL (ISOVUE-370) INJECTION 76%
INTRAVENOUS | Status: AC
  Administered 2016-07-07: 100 mL
  Filled 2016-07-07: qty 100

## 2016-07-07 MED ORDER — ALPRAZOLAM 0.25 MG PO TABS
0.2500 mg | ORAL_TABLET | Freq: Two times a day (BID) | ORAL | Status: DC | PRN
  Administered 2016-07-08: 0.25 mg via ORAL
  Filled 2016-07-07: qty 1

## 2016-07-07 MED ORDER — NITROGLYCERIN 0.4 MG SL SUBL: 0.4000 mg | SUBLINGUAL_TABLET | SUBLINGUAL | Status: DC | PRN

## 2016-07-07 MED ORDER — NICOTINE 21 MG/24HR TD PT24
21.0000 mg | MEDICATED_PATCH | Freq: Every day | TRANSDERMAL | Status: DC
  Filled 2016-07-07: qty 1

## 2016-07-07 MED ORDER — PNEUMOCOCCAL VAC POLYVALENT 25 MCG/0.5ML IJ INJ
0.5000 mL | INJECTION | INTRAMUSCULAR | Status: AC
  Administered 2016-07-08: 0.5 mL via INTRAMUSCULAR
  Filled 2016-07-07: qty 0.5

## 2016-07-07 MED ORDER — TRAZODONE HCL 100 MG PO TABS
100.0000 mg | ORAL_TABLET | Freq: Every day | ORAL | Status: DC
  Administered 2016-07-07 – 2016-07-09 (×3): 100 mg via ORAL
  Filled 2016-07-07 (×3): qty 1

## 2016-07-07 MED ORDER — STROKE: EARLY STAGES OF RECOVERY BOOK
Freq: Once | Status: AC
  Administered 2016-07-08: 11:00:00
  Filled 2016-07-07: qty 1

## 2016-07-07 NOTE — ED Notes (Signed)
Reports feeling SOB.  Sat noted to be 90-92 on RA.  Placed on 2L of oxygen at this time.  Sats rebounded to mid to upper 90's.

## 2016-07-07 NOTE — Progress Notes (Signed)
ANTICOAGULATION CONSULT NOTE - Initial Consult  Pharmacy Consult for heparin Indication: possible right atrial thrombus  Allergies  Allergen Reactions  . Fluoxetine Other (See Comments)    Pt told not to take with eliquis  . Ceftriaxone Itching and Other (See Comments)    Red man syndrome  . Gabapentin Other (See Comments)    Confusion/ chest pains  . Morphine And Related Itching and Nausea And Vomiting  . Nsaids Other (See Comments)    Pt told by hematologist not to take  . Topamax [Topiramate] Other (See Comments)    Dizziness, black out spells  . Toradol [Ketorolac Tromethamine] Itching and Other (See Comments)    Felt hot  . Vicodin [Hydrocodone-Acetaminophen] Other (See Comments)    hallucinations  . Depakote [Divalproex Sodium] Rash and Other (See Comments)    Skin felt like it was on fire  . Methocarbamol Rash  . Metoclopramide Rash and Hives    Patient Measurements: Height: 5\' 3"  (160 cm) Weight: 229 lb 3.2 oz (104 kg) IBW/kg (Calculated) : 52.4 Heparin Dosing Weight: 77  Vital Signs: Temp: 97.8 F (36.6 C) (07/30 2118) Temp Source: Oral (07/30 2118) BP: 136/78 (07/30 2118) Pulse Rate: 105 (07/30 2118)  Labs:  Recent Labs  07/07/16 1318  HGB 15.5*  HCT 45.9  PLT 378  CREATININE 0.72    Estimated Creatinine Clearance: 122.8 mL/min (by C-G formula based on SCr of 0.8 mg/dL).   Medical History: Past Medical History:  Diagnosis Date  . Asthma   . Coronary artery disease    clotting disorder  . Depression   . PTSD (post-traumatic stress disorder)   . Thrombosis of right atrium (HCC)   . Tobacco abuse    Assessment: 12 yof presents with chest pain. Patient has history of coagulapathy and atrial thrombus removed, noncompliant with anticoag d/t insurance issues.   Neurology consulted for possible recurrent transient ischemic attacks, with infarction cannot be rule out at this point. Neuro recommends "low dose heparin" so will aim for lower goal for  now in case stroke present and avoid bolus.  Troponin negative, cbc wnl  Goal of Therapy:  Heparin level 0.3-0.5 units/ml Monitor platelets by anticoagulation protocol: Yes   Plan:  Start heparin infusion at 1000 units/hr Check anti-Xa level in 6 hours and daily while on heparin Continue to monitor H&H and platelets  Sheppard Coil PharmD., BCPS Clinical Pharmacist Pager [phone] 07/07/2016 9:57 PM

## 2016-07-07 NOTE — ED Provider Notes (Signed)
Blood pressure 110/74, pulse 66, temperature 98.5 F (36.9 C), temperature source Oral, resp. rate 14, SpO2 100 %.  Assuming care from Dr. Criss Alvine.  In short, [name] is a 25 y.o. female with a chief complaint of Chest Pain .  Refer to the original H&P for additional details.  The current plan of care is to follow CTA, CT head, and second troponin. Patient will likely require admission for embolic w/u in the setting of known atrial thrombus and Eliquis non-compliance along with intermittent neuro symptoms.   07:32 PM Updated patient regarding CT scan results.   08:18 PM Discussed patient's case with hospitalist, Dr. Clyde Lundborg.  Recommend admission to obs, tele bed.  I will place holding orders per their request. Patient and family (if present) updated with plan. Care transferred to hospitalist service.  I reviewed all nursing notes, vitals, pertinent old records, EKGs, labs, imaging (as available).  Alona Bene, MD   Maia Plan, MD 07/07/16 2018

## 2016-07-07 NOTE — ED Notes (Signed)
Patient transported to CT 

## 2016-07-07 NOTE — H&P (Addendum)
History and Physical    [name] ZOX:[id number] DOB: Mar 26, 1991 DOA: 07/07/2016  Referring MD/NP/PA:   PCP: No PCP Per Patient   Patient coming from:  The patient is coming from home.  At baseline, pt is independent for most of ADL.  Chief Complaint: Chest pain, left-sided numbness and weakness  HPI: [name] is a 25 y.o. female with medical history significant of known hx of coagulopathy, right atrial thrombus with surgical removal, coronary artery disease, PTSD, depression and tobacco abuse, who presents with chest pain, left-sided numbness, weakness.  Patient had history of hyper-coagulopathy and history of right atrial thrombosis. She was initially treated with Coumadin and subsequently was switched to Eliquis. She stopped taking Eliquis since 07/2015 due to lack of insurance. She says she developed the chest pain since last night. Chest pain is located in the front chest, 9 out of 10 in severity, constant, sharp, radiating to the left arm. It is not aggravated or alleviated by any known factors. She has mild shortness of breath, but no cough, fever or chills. She does not have tenderness over calf areas.  She states that she has been having intermittent weakness and numbness in left arm and leg for about 2 -3 week. She has another episode of the left arm weakness since yesterday. Patient states that she has nausea and vomited several times, no diarrhea or abdominal pain. She has increased urinary frequency sometimes, but no burning on urination or dysuria. She does not have vision change or hearing loss.  ED Course: pt was found to have troponin negative, negative pregnancy test, WBC 7.7, temperature normal, no tachycardia, respiration rate 23, electrolytes and renal function okay,  Pending UA and urine culture, negative chest x-ray, negative CT head for acute intracranial abnormalities. Pt is placed on tele bed for obs. Neurology, Dr. Roseanne Reno was consulted.  # CT angiogram of  chest showed: No evidence of aortic dissection, aneurysm or periaortic fluid collection. No evidence of pulmonary embolus. Heterogeneous appearance of the right atrium. It is difficult to discern whether this represent mixing of opacified and non-opacified blood from the superior and inferior vena cava respectively, or right atrial clot. Postoperative changes in the anterior mediastinum with residual stable soft tissue thickening.  # CTA-abd/pelivs showed no evidence of abdominal aortic dissection, aneurysm or periaortic fluid collection. Normal appearance of the named arteries within the abdomen and pelvis. Normal appearance of the solid abdominal and pelvic organs.   Review of Systems:   General: no fevers, chills, no changes in body weight, has poor appetite, has fatigue HEENT: no blurry vision, hearing changes or sore throat Pulm: no dyspnea, coughing, wheezing CV: no chest pain, no palpitations Abd: has nausea, vomiting, no abdominal pain, diarrhea, constipation GU: no dysuria, burning on urination, increased urinary frequency, hematuria  Ext: no leg edema Neuro: has left arm and leg weakness, numbness, or tingling, no vision change or hearing loss Skin: no rash MSK: No muscle spasm, no deformity, no limitation of range of movement in spin Heme: No easy bruising.  Travel history: No recent long distant travel.  Allergy:  Allergies  Allergen Reactions  . Fluoxetine Other (See Comments)    Pt told not to take with eliquis  . Ceftriaxone Itching and Other (See Comments)    Red man syndrome  . Gabapentin Other (See Comments)    Confusion/ chest pains  . Morphine And Related Itching and Nausea And Vomiting  . Nsaids Other (See Comments)    Pt told by hematologist  not to take  . Topamax [Topiramate] Other (See Comments)    Dizziness, black out spells  . Toradol [Ketorolac Tromethamine] Itching and Other (See Comments)    Felt hot  . Vicodin [Hydrocodone-Acetaminophen] Other (See  Comments)    hallucinations  . Depakote [Divalproex Sodium] Rash and Other (See Comments)    Skin felt like it was on fire  . Methocarbamol Rash  . Metoclopramide Rash and Hives    Past Medical History:  Diagnosis Date  . Asthma   . Coronary artery disease    clotting disorder  . Depression   . PTSD (post-traumatic stress disorder)   . Thrombosis of right atrium (HCC)   . Tobacco abuse     Past Surgical History:  Procedure Laterality Date  . CARDIAC SURGERY    . CHOLECYSTECTOMY      Social History:  reports that she has been smoking.  She has never used smokeless tobacco. She reports that she does not drink alcohol. Her drug history is not on file.  Family History:  Family History  Problem Relation Age of Onset  . Bipolar disorder Mother   . Glaucoma Mother   . Arthritis Father      Prior to Admission medications   Medication Sig Start Date End Date Taking? Authorizing Provider  acetaminophen (TYLENOL) 500 MG tablet Take 1,000-1,500 mg by mouth every 4 (four) hours as needed (pain).   Yes Historical Provider, MD  traZODone (DESYREL) 100 MG tablet Take 100 mg by mouth at bedtime.   Yes Historical Provider, MD  Vilazodone HCl (VIIBRYD) 20 MG TABS Take 20 mg by mouth daily.   Yes Historical Provider, MD  oxyCODONE-acetaminophen (PERCOCET/ROXICET) 5-325 MG tablet Take 2 tablets by mouth every 4 (four) hours as needed for severe pain. Patient not taking: Reported on 07/07/2016 04/16/16   Elson Areas, [place]    Physical Exam: Vitals:   07/07/16 2100 07/07/16 2115 07/07/16 2118 07/07/16 2248  BP: 119/80 124/86 136/78   Pulse: 93 103 (!) 105   Resp: 19 13 18    Temp:   97.8 F (36.6 C) 98.4 F (36.9 C)  TempSrc:   Oral   SpO2: 100% 100% 100%   Weight:   104 kg (229 lb 3.2 oz)   Height:   5\' 3"  (1.6 m)    General: Not in acute distress HEENT:       Eyes: PERRL, EOMI, no scleral icterus.       ENT: No discharge from the ears and nose, no pharynx injection, no tonsillar  enlargement.        Neck: No JVD, no bruit, no mass felt. Heme: No neck lymph node enlargement. Cardiac: S1/S2, RRR, No murmurs, No gallops or rubs. Pulm: No rales, wheezing, rhonchi or rubs. Abd: Soft, nondistended, nontender, no rebound pain, no organomegaly, BS present. GU: No hematuria Ext: No pitting leg edema bilaterally. 2+DP/PT pulse bilaterally. Musculoskeletal: No joint deformities, No joint redness or warmth, no limitation of ROM in spin. Skin: No rashes.  Neuro: Alert, oriented X3, cranial nerves II-XII grossly intact, moves all extremities normally. Muscle strength 5/5 in all extremities, sensation to light touch intact. Brachial reflex 2+ bilaterally. Negative Babinski's sign. Psych: Patient is not psychotic, no suicidal or hemocidal ideation.  Labs on Admission: I have personally reviewed following labs and imaging studies  CBC:  Recent Labs Lab 07/07/16 1318  WBC 7.7  HGB 15.5*  HCT 45.9  MCV 85.6  PLT 378   Basic Metabolic Panel:  Recent Labs Lab 07/07/16 1318  NA 135  K 3.9  CL 104  CO2 21*  GLUCOSE 100*  BUN 6  CREATININE 0.72  CALCIUM 8.9   GFR: Estimated Creatinine Clearance: 122.8 mL/min (by C-G formula based on SCr of 0.8 mg/dL). Liver Function Tests: No results for input(s): AST, ALT, ALKPHOS, BILITOT, PROT, ALBUMIN in the last 168 hours. No results for input(s): LIPASE, AMYLASE in the last 168 hours. No results for input(s): AMMONIA in the last 168 hours. Coagulation Profile:  Recent Labs Lab 07/07/16 2202  INR 1.00   Cardiac Enzymes:  Recent Labs Lab 07/07/16 2202  TROPONINI <0.03   BNP (last 3 results) No results for input(s): PROBNP in the last 8760 hours. HbA1C: No results for input(s): HGBA1C in the last 72 hours. CBG: No results for input(s): GLUCAP in the last 168 hours. Lipid Profile: No results for input(s): CHOL, HDL, LDLCALC, TRIG, CHOLHDL, LDLDIRECT in the last 72 hours. Thyroid Function Tests: No results for  input(s): TSH, T4TOTAL, FREET4, T3FREE, THYROIDAB in the last 72 hours. Anemia Panel: No results for input(s): VITAMINB12, FOLATE, FERRITIN, TIBC, IRON, RETICCTPCT in the last 72 hours. Urine analysis:    Component Value Date/Time   COLORURINE YELLOW 02/10/2011 1530   APPEARANCEUR CLEAR 02/10/2011 1530   LABSPEC >1.030 (H) 02/10/2011 1530   PHURINE 5.5 02/10/2011 1530   GLUCOSEU NEGATIVE 02/10/2011 1530   HGBUR TRACE (A) 02/10/2011 1530   BILIRUBINUR NEGATIVE 02/10/2011 1530   KETONESUR NEGATIVE 02/10/2011 1530   PROTEINUR NEGATIVE 02/10/2011 1530   UROBILINOGEN 0.2 02/10/2011 1530   NITRITE NEGATIVE 02/10/2011 1530   LEUKOCYTESUR NEGATIVE 02/10/2011 1530   Sepsis Labs: @LABRCNTIP (procalcitonin:4,lacticidven:4) )No results found for this or any previous visit (from the past 240 hour(s)).   Radiological Exams on Admission: Dg Chest 2 View  Result Date: 07/07/2016 CLINICAL DATA:  Chest pain, cough beginning last night. EXAM: CHEST  2 VIEW COMPARISON:  06/01/2016 FINDINGS: Prior median sternotomy. Heart and mediastinal contours are within normal limits. No focal opacities or effusions. No acute bony abnormality. IMPRESSION: No active cardiopulmonary disease. Electronically Signed   By: Charlett Nose M.D.   On: 07/07/2016 14:12  Ct Head Wo Contrast  Result Date: 07/07/2016 CLINICAL DATA:  Intermittent right arm and leg numbness. EXAM: CT HEAD WITHOUT CONTRAST TECHNIQUE: Contiguous axial images were obtained from the base of the skull through the vertex without intravenous contrast. COMPARISON:  None. FINDINGS: Brain: No evidence of acute infarction, hemorrhage, extra-axial collection, ventriculomegaly, or mass effect. Vascular: No hyperdense vessel or unexpected calcification. Skull: Negative for fracture or focal lesion. Sinuses/Orbits: No acute findings. Other: None. IMPRESSION: No acute intracranial abnormality. Electronically Signed   By: Ted Mcalpine M.D.   On: 07/07/2016  18:42  Ct Angio Chest/abd/pel For Dissection W And/or W/wo  Result Date: 07/07/2016 CLINICAL DATA:  Chest pain, intermittent right arm and leg numbness. History of clotting disorder with prior right atrial clot surgical removal. EXAM: CT ANGIO CHEST-ABD-PELV FOR DISSECTION W/ AND WO/W CM<Procedure Description>CT ANGIO CHEST-ABD-PELV FOR DISSECTION W/ AND WO/W CM TECHNIQUE: CT of the chest abdomen and pelvis without and with contrast, following angiogram of the thorax protocol. CONTRAST:  100 cc Isovue 370. COMPARISON:  CT of the chest 06/01/2006. FINDINGS: CHEST: The lungs are clear. There is no focal mass. There is no pleural effusion or pneumothorax. The heart size is normal. There is no pericardial effusion. Patient is status post median sternotomy. Surgical clips are seen within the anterior mediastinum. There is a residual  soft tissue within the anterior mediastinum measuring 4.0 x 1.7 cm. No evidence of pulmonary embolus. Unremarkable appearance of the thoracic aorta and great vessels without dissection, aneurysm or periaortic fluid. The right atrium has heterogeneous appearance, which may be attributed to mixing of opacified and non-opacified blood from the superior and inferior vena cava respectively. There are no pathologically enlarged mediastinal, hilar, or axillary lymph nodes. There is no lytic or blastic osseous lesion. ABDOMEN/PELVIS: The liver demonstrates no focal abnormality. There is no intrahepatic or extrahepatic biliary ductal dilatation. The gallbladder is surgically absent. The spleen demonstrates no focal abnormality. The kidneys, adrenal glands and pancreas are normal. The bladder is unremarkable. The stomach, duodenum, small intestine, and large intestine demonstrates no contrast extravasation or dilatation. There is no pneumoperitoneum, pneumatosis, or portal venous gas. There is no abdominal or pelvic free fluid. There is no lymphadenopathy. The abdominal aorta is normal in caliber .  Celiac trunk, superior mesenteric artery, bilateral renal arteries, inferior mesenteric artery, bilateral common iliac arteries and their attributes are normal. There is no lytic or blastic osseous lesion. IMPRESSION: CT THORAX IMPRESSION: No evidence of aortic dissection, aneurysm or periaortic fluid collection. No evidence of pulmonary embolus. Heterogeneous appearance of the right atrium. It is difficult to discern whether this represent mixing of opacified and non-opacified blood from the superior and inferior vena cava respectively, or right atrial clot. If right atrial clot is clinically suspected, cardiac echo should be considered. Postoperative changes in the anterior mediastinum with residual stable soft tissue thickening. No acute findings within the thorax. CT ABDOMEN/PELVIS IMPRESSION: No evidence of abdominal aortic dissection, aneurysm or periaortic fluid collection. Normal appearance of the named arteries within the abdomen and pelvis. Normal appearance of the solid abdominal and pelvic organs. Electronically Signed   By: Ted Mcalpine M.D.   On: 07/07/2016 19:14    EKG: Independently reviewed.  Sinus rhythm, QTC 446, diffuse T-wave flattening. Assessment/Plan Principal Problem:   Chest pain Active Problems:   Depression   PTSD (post-traumatic stress disorder)   Numbness on right side   Thrombosis of right atrium (HCC)   Tobacco abuse   Nausea with vomiting   Chest pain: Etiology is not clear. CTA of chest has no PE or dissection, but showed heterogeneous appearance of the right atrium. This is likely due to R atrial thrombosis.  - will place on tele bed for obs - start IV heparin gtt - cycle CE q6 x3 and repeat her EKG in the am  - Nitroglycerin, percocet and dilaudid for pain - Risk factor stratification: will check FLP and A1C  - 2d echo - please call Card in AM  Hx of R atrial thrombus: -see above  Right-sided numbness and weakness: CT head is negative for acute  intracranial abnormalities. It is concerning for embolic stroke. Neurology, Dr. Roseanne Reno was consulted, recommended stroke work up -Highly appreciate Dr. Roseanne Reno consultation, and follow-up recommendations as follows:  1. HgbA1c, fasting lipid panel  2. MRI, MRA  of the brain without contrast  3. PT consult  4. Echocardiogram  5. Carotid dopplers  6. Prophylactic therapy-low-dose IV heparin  7. Risk factor modification  8. Telemetry monitoring  Tobacco abuse: -Did counseling about importance of quitting smoking -Nicotine patch  Depression: Stable, no suicidal or homicidal ideations. -Continue home medications: Vilazodone  Nausea and vomiting: Etiology is not clear. Patient does not have abdominal pain or diarrhea. CT angiogram of the abdomen is not impressive. May be related to right atrial thrombus, causing irritation  to the diaphragm. -Check lipase -When necessary Zofran for nausea.   DVT ppx: on IV Heparin  Code Status: Full code Family Communication:  Yes, patient's fboyfriend at bed side Disposition Plan:  Anticipate discharge back to previous home environment Consults called:  Neurology, Dr. Roseanne Reno Admission status: Obs / tele  Date of Service 07/08/2016    Lorretta Harp [facility] Pager [phone]  If 7PM-7AM, please contact night-coverage [email or web address] Password [email or web address] 07/08/2016, 2:22 AM

## 2016-07-07 NOTE — ED Provider Notes (Signed)
[facility]-EMERGENCY DEPT Provider Note   CSN: [id number] Arrival date & time: 07/07/16  1254  First Provider Contact:  First MD Initiated Contact with Patient 07/07/16 1503        History   Chief Complaint Chief Complaint  Patient presents with  . Chest Pain    HPI [name] is a 25 y.o. female presents with chest pain. Patient states her chest pain started last night. Progressively worsening. Is right-sided and wraps around under her arm. Feels like a sharp stabbing pain. Patient also has been having intermittent right arm and leg numbness. She states it feels like it's weak whenever it occurs. Nothing specifically makes it come and go but it didn't start until after the chest pain. This all feels very similar to when she was diagnosed with a right atrium clot in 2014. She had to have this surgically removed at an outside hospital. She was diagnosed with a hypercoagulable disorder and was originally on warfarin and then on Eliquis due to warfarin issues. However she has been off Eliquis since last year due to monetary issues. She has not felt any leg swelling or recurrent DVT. There is no back pain. There is some shortness of breath. The pain also worsens with inspiration. She tried Tylenol and some leftover Percocet with no relief. Pain is currently severe.  HPI  Past Medical History:  Diagnosis Date  . Coronary artery disease    clotting disorder  . Depression   . PTSD (post-traumatic stress disorder)     There are no active problems to display for this patient.   Past Surgical History:  Procedure Laterality Date  . CARDIAC SURGERY    . CHOLECYSTECTOMY      OB History    No data available       Home Medications    Prior to Admission medications   Medication Sig Start Date End Date Taking? Authorizing Provider  acetaminophen (TYLENOL) 500 MG tablet Take 1,000-1,500 mg by mouth every 4 (four) hours as needed (pain).   Yes Historical Provider, MD  traZODone (DESYREL)  100 MG tablet Take 100 mg by mouth at bedtime.   Yes Historical Provider, MD  Vilazodone HCl (VIIBRYD) 20 MG TABS Take 20 mg by mouth daily.   Yes Historical Provider, MD  oxyCODONE-acetaminophen (PERCOCET/ROXICET) 5-325 MG tablet Take 2 tablets by mouth every 4 (four) hours as needed for severe pain. Patient not taking: Reported on 07/07/2016 04/16/16   Elson Areas, PA-C    Family History No family history on file.  Social History Social History  Substance Use Topics  . Smoking status: Current Every Day Smoker  . Smokeless tobacco: Never Used  . Alcohol use No     Allergies   Fluoxetine; Ceftriaxone; Gabapentin; Morphine and related; Nsaids; Topamax [topiramate]; Toradol [ketorolac tromethamine]; Vicodin [hydrocodone-acetaminophen]; Depakote [divalproex sodium]; Methocarbamol; and Metoclopramide   Review of Systems Review of Systems  Constitutional: Negative for fever.  Respiratory: Positive for shortness of breath.   Cardiovascular: Positive for chest pain.  Gastrointestinal: Negative for abdominal pain and vomiting.  Musculoskeletal: Negative for back pain.  Neurological: Positive for weakness and numbness.  All other systems reviewed and are negative.    Physical Exam Updated Vital Signs BP 110/74   Pulse 66   Temp 98.5 F (36.9 C) (Oral)   Resp 14   SpO2 100%   Physical Exam  Constitutional: She is oriented to person, place, and time. She appears well-developed and well-nourished.  HENT:  Head: Normocephalic and  atraumatic.  Right Ear: External ear normal.  Left Ear: External ear normal.  Nose: Nose normal.  Eyes: Right eye exhibits no discharge. Left eye exhibits no discharge.  Cardiovascular: Normal rate, regular rhythm and normal heart sounds.   Pulses:      Radial pulses are 2+ on the right side, and 2+ on the left side.       Dorsalis pedis pulses are 2+ on the right side, and 2+ on the left side.  Pulmonary/Chest: Effort normal and breath sounds  normal. She exhibits no tenderness.  Abdominal: Soft. There is no tenderness.  Neurological: She is alert and oriented to person, place, and time.  CN 3-12 grossly intact. 5/5 strength in all 4 extremities. Grossly normal sensation.  Skin: Skin is warm and dry.  Nursing note and vitals reviewed.    ED Treatments / Results  Labs (all labs ordered are listed, but only abnormal results are displayed) Labs Reviewed  BASIC METABOLIC PANEL - Abnormal; Notable for the following:       Result Value   CO2 21 (*)    Glucose, Bld 100 (*)    All other components within normal limits  CBC - Abnormal; Notable for the following:    RBC 5.36 (*)    Hemoglobin 15.5 (*)    All other components within normal limits  I-STAT TROPOININ, ED  I-STAT BETA HCG BLOOD, ED ([facility], [facility], [facility] ONLY)    EKG  EKG Interpretation  Date/Time:  Sunday July 07 2016 13:02:46 EDT Ventricular Rate:  99 PR Interval:  132 QRS Duration: 74 QT Interval:  348 QTC Calculation: 446 R Axis:   76 Text Interpretation:  Normal sinus rhythm Nonspecific T wave abnormality Abnormal ECG No old tracing to compare Confirmed by Keegen Heffern MD, Laikynn Pollio [phone]) on 07/07/2016 2:42:21 PM       Radiology Dg Chest 2 View  Result Date: 07/07/2016 CLINICAL DATA:  Chest pain, cough beginning last night. EXAM: CHEST  2 VIEW COMPARISON:  06/01/2016 FINDINGS: Prior median sternotomy. Heart and mediastinal contours are within normal limits. No focal opacities or effusions. No acute bony abnormality. IMPRESSION: No active cardiopulmonary disease. Electronically Signed   By: Charlett Nose M.D.   On: 07/07/2016 14:12   Procedures Procedures (including critical care time)  Medications Ordered in ED Medications  promethazine (PHENERGAN) injection 25 mg (not administered)  iopamidol (ISOVUE-370) 76 % injection (not administered)  fentaNYL (SUBLIMAZE) injection 200 mcg (200 mcg Intravenous Given 07/07/16 1520)  ondansetron (ZOFRAN) injection 4 mg (4 mg  Intravenous Given 07/07/16 1520)  fentaNYL (SUBLIMAZE) injection 200 mcg (200 mcg Intravenous Given 07/07/16 1701)     Initial Impression / Assessment and Plan / ED Course  I have reviewed the triage vital signs and the nursing notes.  Pertinent labs & imaging results that were available during my care of the patient were reviewed by me and considered in my medical decision making (see chart for details).  Clinical Course  Comment By Time  Overall patient appears well. However her prior history is concerning. She is currently not writhing or in severe pain although she reports severe pain. Normal neuro and pulse exam currently. Will CT head and get CTA dissection protocol. Treat pain with IV fentanyl Pricilla Loveless, MD 07/30 1523    Patient is quite complicated. Currently she is not having any focal neurologic deficits. However we'll get a CT head given her thrombotic history. I debated CT PE study versus dissection protocol, given that she needs to  be on Eliquis anyway I think dissection protocol is the most important to get today given the unilateral neuro symptoms. Will also consult case management. If her CT workup is unremarkable I think it would be reasonable to admit her for observation for TIA/clotting workup. However at this time CT head and chest/abdomen/pelvis, are currently pending. Her ECG is abnormal but has some nonspecific T waves that seems similar to outside record reports although I cannot see the pictures. Care transferred to Dr. Jacqulyn Bath who will dispo after CT's.  Final Clinical Impressions(s) / ED Diagnoses   Final diagnoses:  None    New Prescriptions New Prescriptions   No medications on file     Pricilla Loveless, MD 07/07/16 1711

## 2016-07-07 NOTE — ED Notes (Signed)
Pt. Requesting phenergan for nausea. States no improvement with zofran. Pt. States temporary relief with pain medication but return of pain at this time.

## 2016-07-07 NOTE — Care Management (Signed)
CM consulted concerning medication assistance for eliquis CM unable to assist patient received free 30 day supply last year, patient has been without medication since August of 2016. Updated EDP plan for hospital stay.

## 2016-07-07 NOTE — Consult Note (Signed)
Admission H&P    Chief Complaint: Chest pain and intermittent weakness and numbness involving right extremities.  HPI: [name] is an 25 y.o. female with a history of coagulopathy, right atrial thrombus with surgical removal, coronary artery disease, PTSD, depression and tobacco abuse, presenting with chest pain as well as recurrent episodes of tingling and numbness as well as weakness involving right extremities over the past 2-3 weeks. Last episode occurred about 2 PM today and lasted a few minutes. CT scan of her head showed no acute intracranial abnormality. She was initially treated with Coumadin and subsequently was switched to Eliquis. She stopped taking Eliquis about one year ago and has not been on anticoagulation nor antiplatelet therapy.  LSN: 2 PM on 07/07/2016 tPA Given: No: Deficits rapidly resolved mRankin:  Past Medical History:  Diagnosis Date  . Asthma   . Coronary artery disease    clotting disorder  . Depression   . PTSD (post-traumatic stress disorder)   . Thrombosis of right atrium (HCC)   . Tobacco abuse     Past Surgical History:  Procedure Laterality Date  . CARDIAC SURGERY    . CHOLECYSTECTOMY      No family history on file. Social History:  reports that she has been smoking.  She has never used smokeless tobacco. She reports that she does not drink alcohol. Her drug history is not on file.  Allergies:  Allergies  Allergen Reactions  . Fluoxetine Other (See Comments)    Pt told not to take with eliquis  . Ceftriaxone Itching and Other (See Comments)    Red man syndrome  . Gabapentin Other (See Comments)    Confusion/ chest pains  . Morphine And Related Itching and Nausea And Vomiting  . Nsaids Other (See Comments)    Pt told by hematologist not to take  . Topamax [Topiramate] Other (See Comments)    Dizziness, black out spells  . Toradol [Ketorolac Tromethamine] Itching and Other (See Comments)    Felt hot  . Vicodin  [Hydrocodone-Acetaminophen] Other (See Comments)    hallucinations  . Depakote [Divalproex Sodium] Rash and Other (See Comments)    Skin felt like it was on fire  . Methocarbamol Rash  . Metoclopramide Rash and Hives    Medications: Preadmission medications were reviewed by me.  ROS: Review of Systems  Constitutional: Negative for fever.  Respiratory: Positive for shortness of breath.   Cardiovascular: Positive for chest pain.  Gastrointestinal: Negative for abdominal pain and vomiting.  Musculoskeletal: Negative for back pain.  Neurological: Positive for weakness and numbness.  All other systems reviewed and are negative.   Physical Examination: Blood pressure 124/86, pulse 103, temperature 97.8 F (36.6 C), temperature source Oral, resp. rate 13, SpO2 100 %.  HEENT-  Normocephalic, no lesions, without obvious abnormality.  Normal external eye and conjunctiva.  Normal TM's bilaterally.  Normal auditory canals and external ears. Normal external nose, mucus membranes and septum.  Normal pharynx. Neck supple with no masses, nodes, nodules or enlargement. Cardiovascular - regular rate and rhythm, S1, S2 normal, no murmur, click, rub or gallop Lungs - chest clear, no wheezing, rales, normal symmetric air entry Abdomen - soft, non-tender; bowel sounds normal; no masses,  no organomegaly Extremities - no joint deformities, effusion, or inflammation and no edema  Neurologic Examination: Mental Status: Alert, oriented, thought content appropriate.  Speech fluent without evidence of aphasia. Able to follow commands without difficulty. Cranial Nerves: II-Visual fields were normal. III/IV/VI-Pupils were equal and reacted normally  to light. Extraocular movements were full and conjugate.    V/VII-no facial numbness and no facial weakness. VIII-normal. X-normal speech and symmetrical palatal movement. XI: trapezius strength/neck flexion strength normal bilaterally XII-midline tongue  extension with normal strength. Motor: 5/5 bilaterally with normal tone and bulk Sensory: Normal throughout. Deep Tendon Reflexes: 2+ and symmetric. Plantars: Mute bilaterally Cerebellar: Normal finger-to-nose testing. Carotid auscultation: Normal  Results for orders placed or performed during the hospital encounter of 07/07/16 (from the past 48 hour(s))  Basic metabolic panel     Status: Abnormal   Collection Time: 07/07/16  1:18 PM  Result Value Ref Range   Sodium 135 135 - 145 mmol/L   Potassium 3.9 3.5 - 5.1 mmol/L   Chloride 104 101 - 111 mmol/L   CO2 21 (L) 22 - 32 mmol/L   Glucose, Bld 100 (H) 65 - 99 mg/dL   BUN 6 6 - 20 mg/dL   Creatinine, Ser 0.72 0.44 - 1.00 mg/dL   Calcium 8.9 8.9 - 10.3 mg/dL   GFR calc non Af Amer >60 >60 mL/min   GFR calc Af Amer >60 >60 mL/min    Comment: (NOTE) The eGFR has been calculated using the CKD EPI equation. This calculation has not been validated in all clinical situations. eGFR's persistently <60 mL/min signify possible Chronic Kidney Disease.    Anion gap 10 5 - 15  CBC     Status: Abnormal   Collection Time: 07/07/16  1:18 PM  Result Value Ref Range   WBC 7.7 4.0 - 10.5 K/uL   RBC 5.36 (H) 3.87 - 5.11 MIL/uL   Hemoglobin 15.5 (H) 12.0 - 15.0 g/dL   HCT 45.9 36.0 - 46.0 %   MCV 85.6 78.0 - 100.0 fL   MCH 28.9 26.0 - 34.0 pg   MCHC 33.8 30.0 - 36.0 g/dL   RDW 13.7 11.5 - 15.5 %   Platelets 378 150 - 400 K/uL  I-stat troponin, ED     Status: None   Collection Time: 07/07/16  1:24 PM  Result Value Ref Range   Troponin i, poc 0.00 0.00 - 0.08 ng/mL   Comment 3            Comment: Due to the release kinetics of cTnI, a negative result within the first hours of the onset of symptoms does not rule out myocardial infarction with certainty. If myocardial infarction is still suspected, repeat the test at appropriate intervals.   I-Stat beta hCG blood, ED     Status: None   Collection Time: 07/07/16  3:21 PM  Result Value Ref  Range   I-stat hCG, quantitative <5.0 <5 mIU/mL   Comment 3            Comment:   GEST. AGE      CONC.  (mIU/mL)   <=1 WEEK        5 - 50     2 WEEKS       50 - 500     3 WEEKS       100 - 10,000     4 WEEKS     1,000 - 30,000        FEMALE AND NON-PREGNANT FEMALE:     LESS THAN 5 mIU/mL   I-stat troponin, ED     Status: None   Collection Time: 07/07/16  7:04 PM  Result Value Ref Range   Troponin i, poc 0.00 0.00 - 0.08 ng/mL   Comment 3  Comment: Due to the release kinetics of cTnI, a negative result within the first hours of the onset of symptoms does not rule out myocardial infarction with certainty. If myocardial infarction is still suspected, repeat the test at appropriate intervals.    Dg Chest 2 View  Result Date: 07/07/2016 CLINICAL DATA:  Chest pain, cough beginning last night. EXAM: CHEST  2 VIEW COMPARISON:  06/01/2016 FINDINGS: Prior median sternotomy. Heart and mediastinal contours are within normal limits. No focal opacities or effusions. No acute bony abnormality. IMPRESSION: No active cardiopulmonary disease. Electronically Signed   By: Rolm Baptise M.D.   On: 07/07/2016 14:12  Ct Head Wo Contrast  Result Date: 07/07/2016 CLINICAL DATA:  Intermittent right arm and leg numbness. EXAM: CT HEAD WITHOUT CONTRAST TECHNIQUE: Contiguous axial images were obtained from the base of the skull through the vertex without intravenous contrast. COMPARISON:  None. FINDINGS: Brain: No evidence of acute infarction, hemorrhage, extra-axial collection, ventriculomegaly, or mass effect. Vascular: No hyperdense vessel or unexpected calcification. Skull: Negative for fracture or focal lesion. Sinuses/Orbits: No acute findings. Other: None. IMPRESSION: No acute intracranial abnormality. Electronically Signed   By: Fidela Salisbury M.D.   On: 07/07/2016 18:42  Ct Angio Chest/abd/pel For Dissection W And/or W/wo  Result Date: 07/07/2016 CLINICAL DATA:  Chest pain, intermittent  right arm and leg numbness. History of clotting disorder with prior right atrial clot surgical removal. EXAM: CT ANGIO CHEST-ABD-PELV FOR DISSECTION W/ AND WO/W CM<Procedure Description>CT ANGIO CHEST-ABD-PELV FOR DISSECTION W/ AND WO/W CM TECHNIQUE: CT of the chest abdomen and pelvis without and with contrast, following angiogram of the thorax protocol. CONTRAST:  100 cc Isovue 370. COMPARISON:  CT of the chest 06/01/2006. FINDINGS: CHEST: The lungs are clear. There is no focal mass. There is no pleural effusion or pneumothorax. The heart size is normal. There is no pericardial effusion. Patient is status post median sternotomy. Surgical clips are seen within the anterior mediastinum. There is a residual soft tissue within the anterior mediastinum measuring 4.0 x 1.7 cm. No evidence of pulmonary embolus. Unremarkable appearance of the thoracic aorta and great vessels without dissection, aneurysm or periaortic fluid. The right atrium has heterogeneous appearance, which may be attributed to mixing of opacified and non-opacified blood from the superior and inferior vena cava respectively. There are no pathologically enlarged mediastinal, hilar, or axillary lymph nodes. There is no lytic or blastic osseous lesion. ABDOMEN/PELVIS: The liver demonstrates no focal abnormality. There is no intrahepatic or extrahepatic biliary ductal dilatation. The gallbladder is surgically absent. The spleen demonstrates no focal abnormality. The kidneys, adrenal glands and pancreas are normal. The bladder is unremarkable. The stomach, duodenum, small intestine, and large intestine demonstrates no contrast extravasation or dilatation. There is no pneumoperitoneum, pneumatosis, or portal venous gas. There is no abdominal or pelvic free fluid. There is no lymphadenopathy. The abdominal aorta is normal in caliber . Celiac trunk, superior mesenteric artery, bilateral renal arteries, inferior mesenteric artery, bilateral common iliac arteries  and their attributes are normal. There is no lytic or blastic osseous lesion. IMPRESSION: CT THORAX IMPRESSION: No evidence of aortic dissection, aneurysm or periaortic fluid collection. No evidence of pulmonary embolus. Heterogeneous appearance of the right atrium. It is difficult to discern whether this represent mixing of opacified and non-opacified blood from the superior and inferior vena cava respectively, or right atrial clot. If right atrial clot is clinically suspected, cardiac echo should be considered. Postoperative changes in the anterior mediastinum with residual stable soft tissue thickening. No acute  findings within the thorax. CT ABDOMEN/PELVIS IMPRESSION: No evidence of abdominal aortic dissection, aneurysm or periaortic fluid collection. Normal appearance of the named arteries within the abdomen and pelvis. Normal appearance of the solid abdominal and pelvic organs. Electronically Signed   By: Fidela Salisbury M.D.   On: 07/07/2016 19:14   Assessment: 25 y.o. female with a history of coagulopathy and atrial thrombus surgically removed, and noncompliance with anticoagulation, presenting with possible recurrent transient ischemic attacks. Small left cortical or subcortical MCA territory infarctions cannot be ruled out at this point.  Stroke Risk Factors - hypertension and history of atrial clot, and clotting disorder, family history  Plan: 1. HgbA1c, fasting lipid panel 2. MRI, MRA  of the brain without contrast 3. PT consult 4. Echocardiogram 5. Carotid dopplers 6. Prophylactic therapy-low-dose IV heparin 7. Risk factor modification 8. Telemetry monitoring  C.R. Nicole Kindred, MD [facility] [phone]  07/07/2016, 9:21 PM

## 2016-07-07 NOTE — ED Triage Notes (Signed)
c p started last night, had bypass surgery 2014 has not f/u is suppose to be on blood thinners and has not beem

## 2016-07-08 ENCOUNTER — Encounter (HOSPITAL_COMMUNITY): Payer: Self-pay | Admitting: Internal Medicine

## 2016-07-08 ENCOUNTER — Ambulatory Visit (HOSPITAL_COMMUNITY): Payer: Self-pay

## 2016-07-08 ENCOUNTER — Observation Stay (HOSPITAL_COMMUNITY): Payer: Self-pay

## 2016-07-08 ENCOUNTER — Inpatient Hospital Stay (HOSPITAL_COMMUNITY): Payer: Self-pay

## 2016-07-08 ENCOUNTER — Ambulatory Visit (HOSPITAL_BASED_OUTPATIENT_CLINIC_OR_DEPARTMENT_OTHER): Payer: BLUE CROSS/BLUE SHIELD

## 2016-07-08 DIAGNOSIS — Z86718 Personal history of other venous thrombosis and embolism: Secondary | ICD-10-CM

## 2016-07-08 DIAGNOSIS — G43A0 Cyclical vomiting, not intractable: Secondary | ICD-10-CM

## 2016-07-08 DIAGNOSIS — R112 Nausea with vomiting, unspecified: Secondary | ICD-10-CM | POA: Diagnosis present

## 2016-07-08 DIAGNOSIS — M79609 Pain in unspecified limb: Secondary | ICD-10-CM

## 2016-07-08 DIAGNOSIS — F329 Major depressive disorder, single episode, unspecified: Secondary | ICD-10-CM

## 2016-07-08 DIAGNOSIS — F4310 Post-traumatic stress disorder, unspecified: Secondary | ICD-10-CM

## 2016-07-08 DIAGNOSIS — D689 Coagulation defect, unspecified: Secondary | ICD-10-CM

## 2016-07-08 LAB — LIPASE, BLOOD: Lipase: 18 U/L (ref 11–51)

## 2016-07-08 LAB — RAPID URINE DRUG SCREEN, HOSP PERFORMED
Amphetamines: NOT DETECTED
BARBITURATES: NOT DETECTED
Benzodiazepines: NOT DETECTED
COCAINE: NOT DETECTED
Opiates: POSITIVE — AB
TETRAHYDROCANNABINOL: NOT DETECTED

## 2016-07-08 LAB — URINE MICROSCOPIC-ADD ON

## 2016-07-08 LAB — LIPID PANEL
CHOL/HDL RATIO: 5.5 ratio
Cholesterol: 183 mg/dL (ref 0–200)
HDL: 33 mg/dL — AB (ref >40)
LDL CALC: 113 mg/dL — AB (ref 0–99)
TRIGLYCERIDES: 186 mg/dL — AB (ref <150)
VLDL: 37 mg/dL (ref 0–40)

## 2016-07-08 LAB — CBC
HEMATOCRIT: 46.3 % — AB (ref 36.0–46.0)
HEMOGLOBIN: 14.9 g/dL (ref 12.0–15.0)
MCH: 28.7 pg (ref 26.0–34.0)
MCHC: 32.2 g/dL (ref 30.0–36.0)
MCV: 89.2 fL (ref 78.0–100.0)
Platelets: 351 10*3/uL (ref 150–400)
RBC: 5.19 MIL/uL — ABNORMAL HIGH (ref 3.87–5.11)
RDW: 14 % (ref 11.5–15.5)
WBC: 7.5 10*3/uL (ref 4.0–10.5)

## 2016-07-08 LAB — URINALYSIS, ROUTINE W REFLEX MICROSCOPIC
BILIRUBIN URINE: NEGATIVE
Glucose, UA: NEGATIVE mg/dL
Hgb urine dipstick: NEGATIVE
KETONES UR: NEGATIVE mg/dL
NITRITE: NEGATIVE
Protein, ur: NEGATIVE mg/dL
Specific Gravity, Urine: >1.046 — ABNORMAL HIGH (ref 1.005–1.030)
pH: 5.5 (ref 5.0–8.0)

## 2016-07-08 LAB — VAS US CAROTID
LCCAPDIAS: 33 cm/s
LCCAPSYS: 131 cm/s
LEFT ECA DIAS: -13 cm/s
LEFT VERTEBRAL DIAS: -31 cm/s
LICAPDIAS: -35 cm/s
Left CCA dist dias: -35 cm/s
Left CCA dist sys: -84 cm/s
Left ICA prox sys: -71 cm/s
RCCADSYS: -66 cm/s
RCCAPDIAS: 31 cm/s
RIGHT ECA DIAS: -8 cm/s
RIGHT VERTEBRAL DIAS: -21 cm/s
Right CCA prox sys: 107 cm/s

## 2016-07-08 LAB — HEPARIN LEVEL (UNFRACTIONATED)
Heparin Unfractionated: 0.22 IU/mL — ABNORMAL LOW (ref 0.30–0.70)
Heparin Unfractionated: 0.27 IU/mL — ABNORMAL LOW (ref 0.30–0.70)
Heparin Unfractionated: 0.35 IU/mL (ref 0.30–0.70)

## 2016-07-08 LAB — TROPONIN I: Troponin I: <0.03 ng/mL (ref <0.03)

## 2016-07-08 LAB — ECHOCARDIOGRAM COMPLETE
Height: 63 in
Weight: 3641.6 oz

## 2016-07-08 LAB — GLUCOSE, CAPILLARY: Glucose-Capillary: 85 mg/dL (ref 65–99)

## 2016-07-08 MED ORDER — ATORVASTATIN CALCIUM 10 MG PO TABS
10.0000 mg | ORAL_TABLET | Freq: Every day | ORAL | Status: DC
  Administered 2016-07-09: 10 mg via ORAL
  Filled 2016-07-08: qty 1

## 2016-07-08 MED ORDER — PROMETHAZINE HCL 25 MG PO TABS: 12.5000 mg | ORAL_TABLET | Freq: Four times a day (QID) | ORAL | Status: DC | PRN

## 2016-07-08 MED ORDER — HYDROMORPHONE HCL 1 MG/ML IJ SOLN
1.0000 mg | INTRAMUSCULAR | Status: DC | PRN
  Administered 2016-07-08 – 2016-07-09 (×6): 1 mg via INTRAVENOUS
  Filled 2016-07-08 (×6): qty 1

## 2016-07-08 MED ORDER — PROMETHAZINE HCL 25 MG/ML IJ SOLN
12.5000 mg | Freq: Four times a day (QID) | INTRAMUSCULAR | Status: DC | PRN
  Administered 2016-07-08 – 2016-07-10 (×6): 12.5 mg via INTRAVENOUS
  Filled 2016-07-08 (×6): qty 1

## 2016-07-08 NOTE — Progress Notes (Signed)
Rehab Admissions Coordinator Note:  Patient was screened by Trish Mage for appropriateness for an Inpatient Acute Rehab Consult.  At this time, we are recommending HH.  Patient is doing too well functionally to meet criteria for acute inpatient rehab admission.  Recommend home with [facility] therapies.  Trish Mage 07/08/2016, 9:42 AM  I can be reached at [phone].

## 2016-07-08 NOTE — Progress Notes (Signed)
Thank you for consult on [name]. Chart reviewed and note that ahe is at supervision level at PT evaluation and anticipate that she can be discharged to home with HHPT. Will defer CIR consult.

## 2016-07-08 NOTE — Consult Note (Signed)
Referring Physician: Dr. Myers, [facility]  [name] is an 25 y.o. female.                       Chief Complaint: Chest pain  HPI: 25 year old female has history of right atrial thrombus with surgical removal at [facility] in [place] in 2014, treated with warfarin followed by Eliquis until last year when ran out of medication due to high cost. She came with right sided mid axillary line between 5th and 6th rib area, sharp, constant and radiating to right shoulder area. Some shortness of breath with activity but negative CT chest for PE and Right lower extremity ultrasound for DVT. Her Troponin-I x 3 are normal. Her EKG is normal sinus rhythm with low voltage. Transthoracic echocardiogram has normal LV systolic function and no definite evidence of clot in right and or left atrium. No fever or cough. Denies any injury to chest or lifting, pulling or pushing heavy objects.  Past Medical History:  Diagnosis Date  . Asthma   . Coronary artery disease    clotting disorder  . Depression   . PTSD (post-traumatic stress disorder)   . Thrombosis of right atrium (HCC)   . Tobacco abuse       Past Surgical History:  Procedure Laterality Date  . CARDIAC SURGERY    . CHOLECYSTECTOMY      Family History  Problem Relation Age of Onset  . Bipolar disorder Mother   . Glaucoma Mother   . Arthritis Father    Social History:  reports that she has been smoking.  She has never used smokeless tobacco. She reports that she does not drink alcohol. Her drug history is not on file.  Allergies:  Allergies  Allergen Reactions  . Fluoxetine Other (See Comments)    Pt told not to take with eliquis  . Ceftriaxone Itching and Other (See Comments)    Red man syndrome  . Gabapentin Other (See Comments)    Confusion/ chest pains  . Morphine And Related Itching and Nausea And Vomiting  . Nsaids Other (See Comments)    Pt told by hematologist not to take  . Topamax  [Topiramate] Other (See Comments)    Dizziness, black out spells  . Toradol [Ketorolac Tromethamine] Itching and Other (See Comments)    Felt hot  . Vicodin [Hydrocodone-Acetaminophen] Other (See Comments)    hallucinations  . Depakote [Divalproex Sodium] Rash and Other (See Comments)    Skin felt like it was on fire  . Methocarbamol Rash  . Metoclopramide Rash and Hives    Medications Prior to Admission  Medication Sig Dispense Refill  . acetaminophen (TYLENOL) 500 MG tablet Take 1,000-1,500 mg by mouth every 4 (four) hours as needed (pain).    . traZODone (DESYREL) 100 MG tablet Take 100 mg by mouth at bedtime.    . Vilazodone HCl (VIIBRYD) 20 MG TABS Take 20 mg by mouth daily.    . oxyCODONE-acetaminophen (PERCOCET/ROXICET) 5-325 MG tablet Take 2 tablets by mouth every 4 (four) hours as needed for severe pain. (Patient not taking: Reported on 07/07/2016) 12 tablet 0    Results for orders placed or performed during the hospital encounter of 07/07/16 (from the past 48 hour(s))  Basic metabolic panel     Status: Abnormal   Collection Time: 07/07/16  1:18 PM  Result Value Ref Range   Sodium 135 135 - 145 mmol/L   Potassium 3.9 3.5 - 5.1   mmol/L   Chloride 104 101 - 111 mmol/L   CO2 21 (L) 22 - 32 mmol/L   Glucose, Bld 100 (H) 65 - 99 mg/dL   BUN 6 6 - 20 mg/dL   Creatinine, Ser 0.72 0.44 - 1.00 mg/dL   Calcium 8.9 8.9 - 10.3 mg/dL   GFR calc non Af Amer >60 >60 mL/min   GFR calc Af Amer >60 >60 mL/min    Comment: (NOTE) The eGFR has been calculated using the CKD EPI equation. This calculation has not been validated in all clinical situations. eGFR's persistently <60 mL/min signify possible Chronic Kidney Disease.    Anion gap 10 5 - 15  CBC     Status: Abnormal   Collection Time: 07/07/16  1:18 PM  Result Value Ref Range   WBC 7.7 4.0 - 10.5 K/uL   RBC 5.36 (H) 3.87 - 5.11 MIL/uL   Hemoglobin 15.5 (H) 12.0 - 15.0 g/dL   HCT 45.9 36.0 - 46.0 %   MCV 85.6 78.0 - 100.0 fL    MCH 28.9 26.0 - 34.0 pg   MCHC 33.8 30.0 - 36.0 g/dL   RDW 13.7 11.5 - 15.5 %   Platelets 378 150 - 400 K/uL  I-stat troponin, ED     Status: None   Collection Time: 07/07/16  1:24 PM  Result Value Ref Range   Troponin i, poc 0.00 0.00 - 0.08 ng/mL   Comment 3            Comment: Due to the release kinetics of cTnI, a negative result within the first hours of the onset of symptoms does not rule out myocardial infarction with certainty. If myocardial infarction is still suspected, repeat the test at appropriate intervals.   I-Stat beta hCG blood, ED     Status: None   Collection Time: 07/07/16  3:21 PM  Result Value Ref Range   I-stat hCG, quantitative <5.0 <5 mIU/mL   Comment 3            Comment:   GEST. AGE      CONC.  (mIU/mL)   <=1 WEEK        5 - 50     2 WEEKS       50 - 500     3 WEEKS       100 - 10,000     4 WEEKS     1,000 - 30,000        FEMALE AND NON-PREGNANT FEMALE:     LESS THAN 5 mIU/mL   I-stat troponin, ED     Status: None   Collection Time: 07/07/16  7:04 PM  Result Value Ref Range   Troponin i, poc 0.00 0.00 - 0.08 ng/mL   Comment 3            Comment: Due to the release kinetics of cTnI, a negative result within the first hours of the onset of symptoms does not rule out myocardial infarction with certainty. If myocardial infarction is still suspected, repeat the test at appropriate intervals.   Protime-INR     Status: None   Collection Time: 07/07/16 10:02 PM  Result Value Ref Range   Prothrombin Time 13.2 11.4 - 15.2 seconds   INR 1.00   APTT     Status: None   Collection Time: 07/07/16 10:02 PM  Result Value Ref Range   aPTT 30 24 - 36 seconds  Troponin I (q 6hr x 3)       Status: None   Collection Time: 07/07/16 10:02 PM  Result Value Ref Range   Troponin I <0.03 <0.03 ng/mL  Troponin I (q 6hr x 3)     Status: None   Collection Time: 07/08/16  3:38 AM  Result Value Ref Range   Troponin I <0.03 <0.03 ng/mL  Lipase, blood     Status: None    Collection Time: 07/08/16  3:38 AM  Result Value Ref Range   Lipase 18 11 - 51 U/L  Lipid panel     Status: Abnormal   Collection Time: 07/08/16  3:38 AM  Result Value Ref Range   Cholesterol 183 0 - 200 mg/dL   Triglycerides 186 (H) <150 mg/dL   HDL 33 (L) >40 mg/dL   Total CHOL/HDL Ratio 5.5 RATIO   VLDL 37 0 - 40 mg/dL   LDL Cholesterol 113 (H) 0 - 99 mg/dL    Comment:        Total Cholesterol/HDL:CHD Risk Coronary Heart Disease Risk Table                     Men   Women  1/2 Average Risk   3.4   3.3  Average Risk       5.0   4.4  2 X Average Risk   9.6   7.1  3 X Average Risk  23.4   11.0        Use the calculated Patient Ratio above and the CHD Risk Table to determine the patient's CHD Risk.        ATP III CLASSIFICATION (LDL):  <100     mg/dL   Optimal  100-129  mg/dL   Near or Above                    Optimal  130-159  mg/dL   Borderline  160-189  mg/dL   High  >190     mg/dL   Very High   Urinalysis, Routine w reflex microscopic (not at [facility])     Status: Abnormal   Collection Time: 07/08/16  4:10 AM  Result Value Ref Range   Color, Urine YELLOW YELLOW   APPearance CLOUDY (A) CLEAR   Specific Gravity, Urine >1.046 (H) 1.005 - 1.030   pH 5.5 5.0 - 8.0   Glucose, UA NEGATIVE NEGATIVE mg/dL   Hgb urine dipstick NEGATIVE NEGATIVE   Bilirubin Urine NEGATIVE NEGATIVE   Ketones, ur NEGATIVE NEGATIVE mg/dL   Protein, ur NEGATIVE NEGATIVE mg/dL   Nitrite NEGATIVE NEGATIVE   Leukocytes, UA SMALL (A) NEGATIVE  Urine rapid drug screen (hosp performed)     Status: Abnormal   Collection Time: 07/08/16  4:10 AM  Result Value Ref Range   Opiates POSITIVE (A) NONE DETECTED   Cocaine NONE DETECTED NONE DETECTED   Benzodiazepines NONE DETECTED NONE DETECTED   Amphetamines NONE DETECTED NONE DETECTED   Tetrahydrocannabinol NONE DETECTED NONE DETECTED   Barbiturates NONE DETECTED NONE DETECTED    Comment:        DRUG SCREEN FOR MEDICAL PURPOSES ONLY.  IF CONFIRMATION IS  NEEDED FOR ANY PURPOSE, NOTIFY LAB WITHIN 5 DAYS.        LOWEST DETECTABLE LIMITS FOR URINE DRUG SCREEN Drug Class       Cutoff (ng/mL) Amphetamine      1000 Barbiturate      200 Benzodiazepine   161 Tricyclics       096 Opiates  300 Cocaine          300 THC              50   Urine microscopic-add on     Status: Abnormal   Collection Time: 07/08/16  4:10 AM  Result Value Ref Range   Squamous Epithelial / LPF 6-30 (A) NONE SEEN   WBC, UA 6-30 0 - 5 WBC/hpf   RBC / HPF 0-5 0 - 5 RBC/hpf   Bacteria, UA MANY (A) NONE SEEN  Troponin I (q 6hr x 3)     Status: None   Collection Time: 07/08/16  6:02 AM  Result Value Ref Range   Troponin I <0.03 <0.03 ng/mL  Heparin level (unfractionated)     Status: Abnormal   Collection Time: 07/08/16  6:02 AM  Result Value Ref Range   Heparin Unfractionated 0.22 (L) 0.30 - 0.70 IU/mL    Comment:        IF HEPARIN RESULTS ARE BELOW EXPECTED VALUES, AND PATIENT DOSAGE HAS BEEN CONFIRMED, SUGGEST FOLLOW UP TESTING OF ANTITHROMBIN III LEVELS.   CBC     Status: Abnormal   Collection Time: 07/08/16  6:02 AM  Result Value Ref Range   WBC 7.5 4.0 - 10.5 K/uL   RBC 5.19 (H) 3.87 - 5.11 MIL/uL   Hemoglobin 14.9 12.0 - 15.0 g/dL   HCT 46.3 (H) 36.0 - 46.0 %   MCV 89.2 78.0 - 100.0 fL   MCH 28.7 26.0 - 34.0 pg   MCHC 32.2 30.0 - 36.0 g/dL   RDW 14.0 11.5 - 15.5 %   Platelets 351 150 - 400 K/uL  Glucose, capillary     Status: None   Collection Time: 07/08/16 10:15 AM  Result Value Ref Range   Glucose-Capillary 85 65 - 99 mg/dL   Comment 1 Notify RN    Comment 2 Document in Chart   Heparin level (unfractionated)     Status: Abnormal   Collection Time: 07/08/16  4:03 PM  Result Value Ref Range   Heparin Unfractionated 0.27 (L) 0.30 - 0.70 IU/mL    Comment:        IF HEPARIN RESULTS ARE BELOW EXPECTED VALUES, AND PATIENT DOSAGE HAS BEEN CONFIRMED, SUGGEST FOLLOW UP TESTING OF ANTITHROMBIN III LEVELS.    Dg Chest 2 View  Result  Date: 07/07/2016 CLINICAL DATA:  Chest pain, cough beginning last night. EXAM: CHEST  2 VIEW COMPARISON:  06/01/2016 FINDINGS: Prior median sternotomy. Heart and mediastinal contours are within normal limits. No focal opacities or effusions. No acute bony abnormality. IMPRESSION: No active cardiopulmonary disease. Electronically Signed   By: Rolm Baptise M.D.   On: 07/07/2016 14:12  Ct Head Wo Contrast  Result Date: 07/07/2016 CLINICAL DATA:  Intermittent right arm and leg numbness. EXAM: CT HEAD WITHOUT CONTRAST TECHNIQUE: Contiguous axial images were obtained from the base of the skull through the vertex without intravenous contrast. COMPARISON:  None. FINDINGS: Brain: No evidence of acute infarction, hemorrhage, extra-axial collection, ventriculomegaly, or mass effect. Vascular: No hyperdense vessel or unexpected calcification. Skull: Negative for fracture or focal lesion. Sinuses/Orbits: No acute findings. Other: None. IMPRESSION: No acute intracranial abnormality. Electronically Signed   By: Fidela Salisbury M.D.   On: 07/07/2016 18:42  Mr Brain Wo Contrast  Result Date: 07/08/2016 CLINICAL DATA:  Chest pain, recurrent episodes of tingling and numbness, extremity weakness for 2-3 weeks. On anti coagulation for coagulopathy and RIGHT atrial thrombus. Assess TIA. EXAM: MRI HEAD WITHOUT CONTRAST MRA HEAD  WITHOUT CONTRAST TECHNIQUE: Multiplanar, multiecho pulse sequences of the brain and surrounding structures were obtained without intravenous contrast. Angiographic images of the head were obtained using MRA technique without contrast. COMPARISON:  CT HEAD July 07, 2016 FINDINGS: MRI HEAD FINDINGS INTRACRANIAL CONTENTS: No reduced diffusion to suggest acute ischemia. No susceptibility artifact to suggest hemorrhage. The ventricles and sulci are normal for patient's age. Greater than expected number of sub cm FLAIR T2 hyperintensities predominately and subcortical distribution. No masses or mass effect.  No abnormal extra-axial fluid collections. No extra-axial masses though, contrast enhanced sequences would be more sensitive. Normal major intracranial vascular flow voids present at skull base. ORBITS: The included ocular globes and orbital contents are non-suspicious. SINUSES: The mastoid air-cells and included paranasal sinuses are well-aerated. SKULL/SOFT TISSUES: No abnormal sellar expansion. No suspicious calvarial bone marrow signal. Craniocervical junction maintained. Multiple small lymph nodes in the neck. Prominent palatine tonsils. MRA HEAD FINDINGS ANTERIOR CIRCULATION: Normal flow related enhancement of the included cervical, petrous, cavernous and supraclinoid internal carotid arteries. Patent anterior communicating artery. Normal flow related enhancement of the anterior and middle cerebral arteries, including distal segments. No large vessel occlusion, high-grade stenosis, abnormal luminal irregularity, aneurysm. POSTERIOR CIRCULATION: LEFT vertebral artery is dominant. Basilar artery is patent, with normal flow related enhancement of the main branch vessels. Normal flow related enhancement of the posterior cerebral arteries. Small bilateral posterior communicating arteries present. No large vessel occlusion, high-grade stenosis, abnormal luminal irregularity, aneurysm. IMPRESSION: MRI HEAD: No acute intracranial process. Mild nonspecific white matter changes, atypical distribution for demyelination; can be associated with vasculopathy or migraine disorders. MRA HEAD: Negative. Electronically Signed   By: Elon Alas M.D.   On: 07/08/2016 02:38  Dg Swallowing Func-speech Pathology  Result Date: 07/08/2016 Objective Swallowing Evaluation: Type of Study: MBS-Modified Barium Swallow Study Patient Details Name: [name] MRN: [id number] Date of Birth: August 16, 1991 Today's Date: 07/08/2016 Time: SLP Start Time (ACUTE ONLY): 1150-SLP Stop Time (ACUTE ONLY): 1203 SLP Time Calculation (min) (ACUTE  ONLY): 13 min Past Medical History: Past Medical History: Diagnosis Date . Asthma  . Coronary artery disease   clotting disorder . Depression  . PTSD (post-traumatic stress disorder)  . Thrombosis of right atrium (HCC)  . Tobacco abuse  Past Surgical History: Past Surgical History: Procedure Laterality Date . CARDIAC SURGERY   . CHOLECYSTECTOMY   HPI: 25 y.o. female with h/o coagulopathy, R atrial thrombus with surgical removal, PTSD, depression, CAD admitted with chest pain and R sided numbness/weakness for 2-3 weeks. CT and MRI of head negative for acute infarct.  Subjective: "I can't swallow" Assessment / Plan / Recommendation CHL IP CLINICAL IMPRESSIONS 07/08/2016 Therapy Diagnosis WFL Clinical Impression Pt's oropharyngeal swallow is WFL for all consistencies tested. She does have some piecemeal swallowing, which is likely due to pt sensation of difficulty swallowing. There were times during this study when pt felt like she could not swallow or clear POs from her throat, but there was nothing observed by SLP to account for these subjective complaints. Question if pt could be experiencing globus sensation - MD may wish to consider esophageal assessment should symptoms persist. Recommend to initiate regular textures and thin liquids. Reviewed esophageal precautions with patient. No further SLP f/u indicated. Impact on safety and function No limitations   CHL IP TREATMENT RECOMMENDATION 07/08/2016 Treatment Recommendations No treatment recommended at this time   Prognosis 07/08/2016 Prognosis for Safe Diet Advancement Good Barriers to Reach Goals -- Barriers/Prognosis Comment -- CHL IP DIET RECOMMENDATION 07/08/2016 SLP Diet  Recommendations Regular solids;Thin liquid Liquid Administration via Cup;Straw Medication Administration Whole meds with liquid Compensations Slow rate;Small sips/bites;Follow solids with liquid Postural Changes Remain semi-upright after after feeds/meals (Comment);Seated upright at 90 degrees    CHL IP OTHER RECOMMENDATIONS 07/08/2016 Recommended Consults -- Oral Care Recommendations Oral care BID Other Recommendations --   CHL IP FOLLOW UP RECOMMENDATIONS 07/08/2016 Follow up Recommendations None   No flowsheet data found.     CHL IP ORAL PHASE 07/08/2016 Oral Phase WFL Oral - Pudding Teaspoon -- Oral - Pudding Cup -- Oral - Honey Teaspoon -- Oral - Honey Cup -- Oral - Nectar Teaspoon -- Oral - Nectar Cup -- Oral - Nectar Straw -- Oral - Thin Teaspoon -- Oral - Thin Cup -- Oral - Thin Straw -- Oral - Puree -- Oral - Mech Soft -- Oral - Regular -- Oral - Multi-Consistency -- Oral - Pill -- Oral Phase - Comment --  CHL IP PHARYNGEAL PHASE 07/08/2016 Pharyngeal Phase WFL Pharyngeal- Pudding Teaspoon -- Pharyngeal -- Pharyngeal- Pudding Cup -- Pharyngeal -- Pharyngeal- Honey Teaspoon -- Pharyngeal -- Pharyngeal- Honey Cup -- Pharyngeal -- Pharyngeal- Nectar Teaspoon -- Pharyngeal -- Pharyngeal- Nectar Cup -- Pharyngeal -- Pharyngeal- Nectar Straw -- Pharyngeal -- Pharyngeal- Thin Teaspoon -- Pharyngeal -- Pharyngeal- Thin Cup -- Pharyngeal -- Pharyngeal- Thin Straw -- Pharyngeal -- Pharyngeal- Puree -- Pharyngeal -- Pharyngeal- Mechanical Soft -- Pharyngeal -- Pharyngeal- Regular -- Pharyngeal -- Pharyngeal- Multi-consistency -- Pharyngeal -- Pharyngeal- Pill -- Pharyngeal -- Pharyngeal Comment --  CHL IP CERVICAL ESOPHAGEAL PHASE 07/08/2016 Cervical Esophageal Phase WFL Pudding Teaspoon -- Pudding Cup -- Honey Teaspoon -- Honey Cup -- Nectar Teaspoon -- Nectar Cup -- Nectar Straw -- Thin Teaspoon -- Thin Cup -- Thin Straw -- Puree -- Mechanical Soft -- Regular -- Multi-consistency -- Pill -- Cervical Esophageal Comment -- CHL IP GO 07/08/2016 Functional Assessment Tool Used skilled clinical judgment Functional Limitations Swallowing Swallow Current Status (G8996) CI Swallow Goal Status (G8997) CI Swallow Discharge Status (G8998) CI Motor Speech Current Status (G8999) (None) Motor Speech Goal Status (G9186) (None)  Motor Speech Goal Status (G9158) (None) Spoken Language Comprehension Current Status (G9159) (None) Spoken Language Comprehension Goal Status (G9160) (None) Spoken Language Comprehension Discharge Status (G9161) (None) Spoken Language Expression Current Status (G9162) (None) Spoken Language Expression Goal Status (G9163) (None) Spoken Language Expression Discharge Status (G9164) (None) Attention Current Status (G9165) (None) Attention Goal Status (G9166) (None) Attention Discharge Status (G9167) (None) Memory Current Status (G9168) (None) Memory Goal Status (G9169) (None) Memory Discharge Status (G9170) (None) Voice Current Status (G9171) (None) Voice Goal Status (G9172) (None) Voice Discharge Status (G9173) (None) Other Speech-Language Pathology Functional Limitation (G9174) (None) Other Speech-Language Pathology Functional Limitation Goal Status (G9175) (None) Other Speech-Language Pathology Functional Limitation Discharge Status (G9176) (None) Paiewonsky, Laura 07/08/2016, 12:12 PM  Laura Paiewonsky, M.A. CCC-SLP [phone]             Mr Mra Headm  Result Date: 07/08/2016 CLINICAL DATA:  Chest pain, recurrent episodes of tingling and numbness, extremity weakness for 2-3 weeks. On anti coagulation for coagulopathy and RIGHT atrial thrombus. Assess TIA. EXAM: MRI HEAD WITHOUT CONTRAST MRA HEAD WITHOUT CONTRAST TECHNIQUE: Multiplanar, multiecho pulse sequences of the brain and surrounding structures were obtained without intravenous contrast. Angiographic images of the head were obtained using MRA technique without contrast. COMPARISON:  CT HEAD July 07, 2016 FINDINGS: MRI HEAD FINDINGS INTRACRANIAL CONTENTS: No reduced diffusion to suggest acute ischemia. No susceptibility artifact to suggest hemorrhage. The ventricles and sulci are normal for   patient's age. Greater than expected number of sub cm FLAIR T2 hyperintensities predominately and subcortical distribution. No masses or mass effect. No abnormal  extra-axial fluid collections. No extra-axial masses though, contrast enhanced sequences would be more sensitive. Normal major intracranial vascular flow voids present at skull base. ORBITS: The included ocular globes and orbital contents are non-suspicious. SINUSES: The mastoid air-cells and included paranasal sinuses are well-aerated. SKULL/SOFT TISSUES: No abnormal sellar expansion. No suspicious calvarial bone marrow signal. Craniocervical junction maintained. Multiple small lymph nodes in the neck. Prominent palatine tonsils. MRA HEAD FINDINGS ANTERIOR CIRCULATION: Normal flow related enhancement of the included cervical, petrous, cavernous and supraclinoid internal carotid arteries. Patent anterior communicating artery. Normal flow related enhancement of the anterior and middle cerebral arteries, including distal segments. No large vessel occlusion, high-grade stenosis, abnormal luminal irregularity, aneurysm. POSTERIOR CIRCULATION: LEFT vertebral artery is dominant. Basilar artery is patent, with normal flow related enhancement of the main branch vessels. Normal flow related enhancement of the posterior cerebral arteries. Small bilateral posterior communicating arteries present. No large vessel occlusion, high-grade stenosis, abnormal luminal irregularity, aneurysm. IMPRESSION: MRI HEAD: No acute intracranial process. Mild nonspecific white matter changes, atypical distribution for demyelination; can be associated with vasculopathy or migraine disorders. MRA HEAD: Negative. Electronically Signed   By: Elon Alas M.D.   On: 07/08/2016 02:38  Ct Angio Chest/abd/pel For Dissection W And/or W/wo  Result Date: 07/07/2016 CLINICAL DATA:  Chest pain, intermittent right arm and leg numbness. History of clotting disorder with prior right atrial clot surgical removal. EXAM: CT ANGIO CHEST-ABD-PELV FOR DISSECTION W/ AND WO/W CM<Procedure Description>CT ANGIO CHEST-ABD-PELV FOR DISSECTION W/ AND WO/W CM  TECHNIQUE: CT of the chest abdomen and pelvis without and with contrast, following angiogram of the thorax protocol. CONTRAST:  100 cc Isovue 370. COMPARISON:  CT of the chest 06/01/2006. FINDINGS: CHEST: The lungs are clear. There is no focal mass. There is no pleural effusion or pneumothorax. The heart size is normal. There is no pericardial effusion. Patient is status post median sternotomy. Surgical clips are seen within the anterior mediastinum. There is a residual soft tissue within the anterior mediastinum measuring 4.0 x 1.7 cm. No evidence of pulmonary embolus. Unremarkable appearance of the thoracic aorta and great vessels without dissection, aneurysm or periaortic fluid. The right atrium has heterogeneous appearance, which may be attributed to mixing of opacified and non-opacified blood from the superior and inferior vena cava respectively. There are no pathologically enlarged mediastinal, hilar, or axillary lymph nodes. There is no lytic or blastic osseous lesion. ABDOMEN/PELVIS: The liver demonstrates no focal abnormality. There is no intrahepatic or extrahepatic biliary ductal dilatation. The gallbladder is surgically absent. The spleen demonstrates no focal abnormality. The kidneys, adrenal glands and pancreas are normal. The bladder is unremarkable. The stomach, duodenum, small intestine, and large intestine demonstrates no contrast extravasation or dilatation. There is no pneumoperitoneum, pneumatosis, or portal venous gas. There is no abdominal or pelvic free fluid. There is no lymphadenopathy. The abdominal aorta is normal in caliber . Celiac trunk, superior mesenteric artery, bilateral renal arteries, inferior mesenteric artery, bilateral common iliac arteries and their attributes are normal. There is no lytic or blastic osseous lesion. IMPRESSION: CT THORAX IMPRESSION: No evidence of aortic dissection, aneurysm or periaortic fluid collection. No evidence of pulmonary embolus. Heterogeneous  appearance of the right atrium. It is difficult to discern whether this represent mixing of opacified and non-opacified blood from the superior and inferior vena cava respectively, or right atrial clot. If right atrial  clot is clinically suspected, cardiac echo should be considered. Postoperative changes in the anterior mediastinum with residual stable soft tissue thickening. No acute findings within the thorax. CT ABDOMEN/PELVIS IMPRESSION: No evidence of abdominal aortic dissection, aneurysm or periaortic fluid collection. Normal appearance of the named arteries within the abdomen and pelvis. Normal appearance of the solid abdominal and pelvic organs. Electronically Signed   By: Dobrinka  Dimitrova M.D.   On: 07/07/2016 19:14   Review Of Systems Constitutional: No fever No change in weight. Respiratory: Positive shortness of breath. No hemoptysis. Cardiovascular: H/O right atrial thrombus, Chest pain and shortness of breath. No palpitation. Gastrointestinal: No pain, vomiting, diarrhea or constipation.  Renal: No hematuria or kidney stone. Neurology: Right sided weakness and numbness.  Musculoskeletal: No fibromyalgia or back pain. Hematology : Positive coagulopathy.  Blood pressure (!) 124/59, pulse 83, temperature 98.2 F (36.8 C), temperature source Oral, resp. rate 16, height 5' 3" (1.6 m), weight 103.2 kg (227 lb 9.6 oz), SpO2 98 %. Physical Exam: General: Well built and nourished.  Head: Normocephalic and atraumatic. Eyes: Normal conjunctivae and Sclerae. EOMI. ENT: Normal right and left external ears and nose.  Heart: Normal rate and regular rhythm. No murmur, gallop or rub. Chest : Tenderness on palpation over right mid axillary area and 5th and 6th rib area. Lungs-clear bilaterally. Abdomen: Soft, non-tender. Neurology: Cranial nerves grossly intact. AxOx3 Moves all 4 extremities. Skin: warm and dry.  Assessment/Plan Chest pain, probably musculoskeletal Right leg pain, DVT  ruled out. Shortness of breath PE ruled out MI ruled out H/O right atrial thrombus Depression Post Traumatic Stress Disorder Tobacco use disorder  Plan: TEE in AM to exclude atrial thrombus.  Clemente Dewey S, MD  07/08/2016, 9:26 PM    

## 2016-07-08 NOTE — Progress Notes (Signed)
Occupational Therapy Evaluation Patient Details Name: [name] MRN: [id number] DOB: 04-04-1991 Today's Date: 07/08/2016    History of Present Illness 25 y.o. female with h/o coagulopathy, R atrial thrombus with surgical removal, PTSD, depression, CAD admitted with chest pain and R sided numbness/weakness for 2-3 weeks. CT and MRI of head negative for acute infarct.    Clinical Impression   PTA, pt was independent with ADLs and mobility. Pt presents with inconsistent RUE strength testing (ranging from 3+-5/5), RUE/LE numbness, dizziness and unsteadiness when ambulating. Pt was able to complete basic transfers and LB ADLs in standing at supervision. Pt will benefit from continued acute OT to increase independence and safety with ADLs and mobility to allow for safe discharge home. No OT follow up or DME recommended at this time.     Follow Up Recommendations  No OT follow up;Supervision - Intermittent    Equipment Recommendations  None recommended by OT    Recommendations for Other Services       Precautions / Restrictions Precautions Precautions: Fall Precaution Comments: monitor vitals, pt denies h/o falls in past 1 year Restrictions Weight Bearing Restrictions: No      Mobility Bed Mobility Overal bed mobility: Modified Independent             General bed mobility comments: HOB flat, no use of bedrails.   Transfers Overall transfer level: Needs assistance Equipment used: Rolling walker (2 wheeled) Transfers: Sit to/from Stand Sit to Stand: Supervision         General transfer comment: Supervision for safety. Some mild unsteadiness when ambulating in room to complete ADLs.    Balance Overall balance assessment: Needs assistance Sitting-balance support: No upper extremity supported;Feet supported Sitting balance-Leahy Scale: Normal     Standing balance support: No upper extremity supported;During functional activity Standing balance-Leahy Scale:  Good Standing balance comment: Able to adjust pants in standing and shifting weight side-side                            ADL Overall ADL's : Needs assistance/impaired     Grooming: Wash/dry hands;Supervision/safety;Standing   Upper Body Bathing: Modified independent;Sitting   Lower Body Bathing: Modified independent;Sit to/from stand   Upper Body Dressing : Modified independent;Sitting   Lower Body Dressing: Modified independent;Sit to/from stand   Toilet Transfer: Supervision/safety;Ambulation;Regular Toilet;RW   Toileting- Clothing Manipulation and Hygiene: Supervision/safety;Sit to/from stand   Tub/ Shower Transfer: Min guard;Ambulation;Rolling walker   Functional mobility during ADLs: Supervision/safety;Rolling walker       Vision Additional Comments: unable to assess - transport arrived to take pt for test   Perception     Praxis Praxis Praxis tested?: Within functional limits    Pertinent Vitals/Pain Pain Assessment: 0-10 Pain Score: 9  Faces Pain Scale: Hurts little more Pain Location: chest and RLE Pain Descriptors / Indicators: Aching;Sore Pain Intervention(s): Limited activity within patient's tolerance;Monitored during session;Premedicated before session;Repositioned     Hand Dominance Right   Extremity/Trunk Assessment Upper Extremity Assessment Upper Extremity Assessment: RUE deficits/detail RUE Deficits / Details: inconsistent strength testing and pt using RUE during functional activities without deficits observed - ranged from 3+/5-5/5 for all muscle groups; AROM wfl; sensation impaired as pt reports numbness/tingling worse distally (unable to test due to transport arriving) RUE Sensation: decreased light touch   Lower Extremity Assessment Lower Extremity Assessment: Defer to PT evaluation   Cervical / Trunk Assessment Cervical / Trunk Assessment: Normal   Communication Communication Communication: No  difficulties   Cognition  Arousal/Alertness: Lethargic;Suspect due to medications Behavior During Therapy: [facility] for tasks assessed/performed Overall Cognitive Status: Within Functional Limits for tasks assessed                     General Comments       Exercises       Shoulder Instructions      Home Living Family/patient expects to be discharged to:: Private residence Living Arrangements: Alone Available Help at Discharge: Family;Available PRN/intermittently   Home Access: Stairs to enter Entrance Stairs-Number of Steps: 3 Entrance Stairs-Rails: None Home Layout: One level     Bathroom Shower/Tub: Tub/shower unit;Curtain Shower/tub characteristics: Engineer, building services: Standard     Home Equipment: None          Prior Functioning/Environment Level of Independence: Independent             OT Diagnosis: Acute pain;Disturbance of vision;Paresis   OT Problem List: Decreased strength;Decreased activity tolerance;Impaired balance (sitting and/or standing);Impaired vision/perception;Decreased safety awareness;Decreased knowledge of use of DME or AE;Impaired sensation;Pain   OT Treatment/Interventions:      OT Goals(Current goals can be found in the care plan section) Acute Rehab OT Goals Patient Stated Goal: to have less pain OT Goal Formulation: With patient Time For Goal Achievement: 07/22/16 Potential to Achieve Goals: Good ADL Goals Pt Will Transfer to Toilet: with modified independence;regular height toilet Pt Will Perform Toileting - Clothing Manipulation and hygiene: with modified independence;sit to/from stand Pt Will Perform Tub/Shower Transfer: Tub transfer;with modified independence;ambulating Pt/caregiver will Perform Home Exercise Program: Increased ROM;Right Upper extremity;With theraband;Independently;With written HEP provided  OT Frequency: Min 2X/week   Barriers to D/C: Decreased caregiver support          Co-evaluation              End of Session  Equipment Utilized During Treatment: Gait belt;Rolling walker Nurse Communication: Mobility status  Activity Tolerance: Other (comment) (Evaluation limited by transport arriving to take pt to test) Patient left: in bed;with call bell/phone within reach;Other (comment) (with transport)   Time: 0120-0132 OT Time Calculation (min): 12 min Charges:  OT General Charges $OT Visit: 1 Procedure OT Evaluation $OT Eval Moderate Complexity: 1 Procedure G-Codes: OT G-codes **NOT FOR INPATIENT CLASS** Functional Assessment Tool Used: clinical judgement Functional Limitation: Self care Self Care Current Status (U0454): At least 1 percent but less than 20 percent impaired, limited or restricted Self Care Goal Status (U9811): 0 percent impaired, limited or restricted  Nils Pyle, OTR/L Pager: [phone] 07/08/2016, 1:48 PM

## 2016-07-08 NOTE — Evaluation (Signed)
Clinical/Bedside Swallow Evaluation Patient Details  Name: [name] MRN: [id number] Date of Birth: 10-17-1991  Today's Date: 07/08/2016 Time: SLP Start Time (ACUTE ONLY): 1024 SLP Stop Time (ACUTE ONLY): 1035 SLP Time Calculation (min) (ACUTE ONLY): 11 min  Past Medical History:  Past Medical History:  Diagnosis Date  . Asthma   . Coronary artery disease    clotting disorder  . Depression   . PTSD (post-traumatic stress disorder)   . Thrombosis of right atrium (HCC)   . Tobacco abuse    Past Surgical History:  Past Surgical History:  Procedure Laterality Date  . CARDIAC SURGERY    . CHOLECYSTECTOMY     HPI:  25 y.o. female with h/o coagulopathy, R atrial thrombus with surgical removal, PTSD, depression, CAD admitted with chest pain and R sided numbness/weakness for 2-3 weeks. CT and MRI of head negative for acute infarct.    Assessment / Plan / Recommendation Clinical Impression  Pt has c/o not being able to swallow, but upon further questioning she says that she has been managing her saliva well today. She has effortful swallows and facial grimacing, and has intermittent throat clearing following purees only. Recommend to proceed with MBS to better assess. Would recommend full liquid diet, meds crushed in puree until that time.    Aspiration Risk  Mild aspiration risk    Diet Recommendation Thin liquid   Liquid Administration via: Cup;Straw Medication Administration: Crushed with puree Supervision: Patient able to self feed;Intermittent supervision to cue for compensatory strategies Compensations: Slow rate;Small sips/bites Postural Changes: Seated upright at 90 degrees;Remain upright for at least 30 minutes after po intake    Other  Recommendations Oral Care Recommendations: Oral care BID   Follow up Recommendations   (tba)    Frequency and Duration            Prognosis Prognosis for Safe Diet Advancement: Good      Swallow Study   General HPI: 25 y.o.  female with h/o coagulopathy, R atrial thrombus with surgical removal, PTSD, depression, CAD admitted with chest pain and R sided numbness/weakness for 2-3 weeks. CT and MRI of head negative for acute infarct.  Type of Study: Bedside Swallow Evaluation Previous Swallow Assessment: none in chart Diet Prior to this Study: NPO Temperature Spikes Noted: No Respiratory Status: Nasal cannula History of Recent Intubation: No Behavior/Cognition: Alert;Cooperative Oral Cavity Assessment: Within Functional Limits Oral Care Completed by SLP: No Oral Cavity - Dentition: Adequate natural dentition Vision: Functional for self-feeding Self-Feeding Abilities: Able to feed self Patient Positioning: Upright in bed Baseline Vocal Quality: Other (comment) (raspy)    Oral/Motor/Sensory Function Overall Oral Motor/Sensory Function: Within functional limits   Ice Chips Ice chips: Impaired Presentation: Self Fed;Spoon Pharyngeal Phase Impairments: Other (comments) (facial grimacing, effortful swallows)   Thin Liquid Thin Liquid: Impaired Presentation: Cup;Self Fed;Straw Pharyngeal  Phase Impairments: Other (comments) (facial grimacing, effortful swallows)    Nectar Thick Nectar Thick Liquid: Not tested   Honey Thick Honey Thick Liquid: Not tested   Puree Puree: Impaired Presentation: Self Fed;Spoon Pharyngeal Phase Impairments: Other (comments);Throat Clearing - Immediate (facial grimacing, effortful swallows)   Solid   GO   Solid: Not tested    Functional Assessment Tool Used: skilled clinical judgment Functional Limitations: Swallowing Swallow Current Status (K9179): At least 20 percent but less than 40 percent impaired, limited or restricted Swallow Goal Status [phone]): At least 1 percent but less than 20 percent impaired, limited or restricted   [name] 07/08/2016,11:45 AM  [name], M.A. CCC-SLP [phone]

## 2016-07-08 NOTE — Progress Notes (Signed)
  Echocardiogram 2D Echocardiogram has been performed.  [name] 07/08/2016, 3:27 PM

## 2016-07-08 NOTE — Progress Notes (Signed)
**  Preliminary report by tech**  Carotid duplex exam completed. No significant extracranial carotid artery stenosis demonstrated. Vertebrals are patent with antegrade flow.  07/08/16 3:38 PM Olen Cordial RVT

## 2016-07-08 NOTE — Progress Notes (Signed)
Patient ID: [name], female   DOB: 1991/02/16, 25 y.o.   MRN: [id number]    PROGRESS NOTE   Rozalee Kellar  RSW:[id number] DOB: 1991-12-04 DOA: 07/07/2016  PCP: No PCP Per Patient   Brief Narrative:  25 y.o. female with hx of coagulopathy, right atrial thrombus with surgical removal, coronary artery disease, PTSD, depression and tobacco abuse, who presented with chest pain, left-sided numbness, weakness. She was initially treated with Coumadin and subsequently was switched to Eliquis. She stopped taking Eliquis since 07/2015 due to lack of insurance.  Assessment & Plan:  Chest pain - Etiology is not clear. CTA of chest with no PE or dissection, but showed heterogeneous appearance of RA, ?  R atrial thrombosis - pt started on Heparin drip  - cardiology consulted for assistance  - keep on tele for now  - Ce's so far negative   Hx of R atrial thrombus:  Right-sided numbness and weakness - CT head is negative for acute intracranial abnormalities - MRI - nonspecific white matter changes, no signs of acute stroke - 2D Echo pending  - LDL 113 - Patient counseled to be compliant with medications - Therapy recommendations: Inpatient rehabilitation recommended, consult placed  Tobacco abuse: - Did counseling about importance of quitting smoking - Provide nicotine patch  Depression - Stable, no suicidal or homicidal ideations. - Continue home medications: Vilazodone  Nausea and vomiting: - Unclear etiology  - Allow Phenergan as needed  - Has some dysphasia so we've requested SLP evaluation   Morbid obesity - Body mass index is 40.32 kg/m.  DVT prophylaxis: On Heparin  Code Status: Full  Family Communication: Patient at bedside  Disposition Plan: Inpatient rehab MD consulted   Consultants:   Cardiology   Neurology   Procedures:   None   Antimicrobials:   None   Subjective: Still with chest pain.   Objective: Vitals:   07/08/16 0346 07/08/16  0357 07/08/16 0632 07/08/16 0740  BP: 110/65  102/65 (!) 110/53  Pulse: 64  90 73  Resp: 16     Temp: 97.9 F (36.6 C)   97.8 F (36.6 C)  TempSrc: Oral   Oral  SpO2: 99%  100% 100%  Weight:  103.2 kg (227 lb 9.6 oz)    Height:        Intake/Output Summary (Last 24 hours) at 07/08/16 0938 Last data filed at 07/08/16 0400  Gross per 24 hour  Intake              240 ml  Output              125 ml  Net              115 ml   Filed Weights   07/07/16 2118 07/08/16 0357  Weight: 104 kg (229 lb 3.2 oz) 103.2 kg (227 lb 9.6 oz)    Examination:  General exam: Appears to be in mild distress due to pain  Respiratory system: Respiratory effort normal. Cardiovascular system: RRR. No JVD, murmurs, rubs, gallops or clicks. No pedal edema. Gastrointestinal system: Abdomen is nondistended, soft and nontender. No organomegaly or masses felt. Normal bowel sounds heard. Central nervous system: Alert and oriented. No focal neurological deficits.   Data Reviewed: I have personally reviewed following labs and imaging studies  CBC:  Recent Labs Lab 07/07/16 1318 07/08/16 0602  WBC 7.7 7.5  HGB 15.5* 14.9  HCT 45.9 46.3*  MCV 85.6 89.2  PLT 378 351   Basic Metabolic  Panel:  Recent Labs Lab 07/07/16 1318  NA 135  K 3.9  CL 104  CO2 21*  GLUCOSE 100*  BUN 6  CREATININE 0.72  CALCIUM 8.9   Recent Labs Lab 07/08/16 0338  LIPASE 18   Coagulation Profile:  Recent Labs Lab 07/07/16 2202  INR 1.00   Cardiac Enzymes:  Recent Labs Lab 07/07/16 2202 07/08/16 0338 07/08/16 0602  TROPONINI <0.03 <0.03 <0.03   Lipid Profile:  Recent Labs  07/08/16 0338  CHOL 183  HDL 33*  LDLCALC 113*  TRIG 186*  CHOLHDL 5.5   Urine analysis:    Component Value Date/Time   COLORURINE YELLOW 07/08/2016 0410   APPEARANCEUR CLOUDY (A) 07/08/2016 0410   LABSPEC >1.046 (H) 07/08/2016 0410   PHURINE 5.5 07/08/2016 0410   GLUCOSEU NEGATIVE 07/08/2016 0410   HGBUR NEGATIVE  07/08/2016 0410   BILIRUBINUR NEGATIVE 07/08/2016 0410   KETONESUR NEGATIVE 07/08/2016 0410   PROTEINUR NEGATIVE 07/08/2016 0410   UROBILINOGEN 0.2 02/10/2011 1530   NITRITE NEGATIVE 07/08/2016 0410   LEUKOCYTESUR SMALL (A) 07/08/2016 0410   Radiology Studies: Dg Chest 2 View  Result Date: 07/07/2016 CLINICAL DATA:  Chest pain, cough beginning last night. EXAM: CHEST  2 VIEW COMPARISON:  06/01/2016 FINDINGS: Prior median sternotomy. Heart and mediastinal contours are within normal limits. No focal opacities or effusions. No acute bony abnormality. IMPRESSION: No active cardiopulmonary disease. Electronically Signed   By: Charlett Nose M.D.   On: 07/07/2016 14:12  Ct Head Wo Contrast  Result Date: 07/07/2016 CLINICAL DATA:  Intermittent right arm and leg numbness. EXAM: CT HEAD WITHOUT CONTRAST TECHNIQUE: Contiguous axial images were obtained from the base of the skull through the vertex without intravenous contrast. COMPARISON:  None. FINDINGS: Brain: No evidence of acute infarction, hemorrhage, extra-axial collection, ventriculomegaly, or mass effect. Vascular: No hyperdense vessel or unexpected calcification. Skull: Negative for fracture or focal lesion. Sinuses/Orbits: No acute findings. Other: None. IMPRESSION: No acute intracranial abnormality. Electronically Signed   By: Ted Mcalpine M.D.   On: 07/07/2016 18:42  Mr Brain Wo Contrast  Result Date: 07/08/2016 CLINICAL DATA:  Chest pain, recurrent episodes of tingling and numbness, extremity weakness for 2-3 weeks. On anti coagulation for coagulopathy and RIGHT atrial thrombus. Assess TIA. EXAM: MRI HEAD WITHOUT CONTRAST MRA HEAD WITHOUT CONTRAST TECHNIQUE: Multiplanar, multiecho pulse sequences of the brain and surrounding structures were obtained without intravenous contrast. Angiographic images of the head were obtained using MRA technique without contrast. COMPARISON:  CT HEAD July 07, 2016 FINDINGS: MRI HEAD FINDINGS INTRACRANIAL  CONTENTS: No reduced diffusion to suggest acute ischemia. No susceptibility artifact to suggest hemorrhage. The ventricles and sulci are normal for patient's age. Greater than expected number of sub cm FLAIR T2 hyperintensities predominately and subcortical distribution. No masses or mass effect. No abnormal extra-axial fluid collections. No extra-axial masses though, contrast enhanced sequences would be more sensitive. Normal major intracranial vascular flow voids present at skull base. ORBITS: The included ocular globes and orbital contents are non-suspicious. SINUSES: The mastoid air-cells and included paranasal sinuses are well-aerated. SKULL/SOFT TISSUES: No abnormal sellar expansion. No suspicious calvarial bone marrow signal. Craniocervical junction maintained. Multiple small lymph nodes in the neck. Prominent palatine tonsils. MRA HEAD FINDINGS ANTERIOR CIRCULATION: Normal flow related enhancement of the included cervical, petrous, cavernous and supraclinoid internal carotid arteries. Patent anterior communicating artery. Normal flow related enhancement of the anterior and middle cerebral arteries, including distal segments. No large vessel occlusion, high-grade stenosis, abnormal luminal irregularity, aneurysm. POSTERIOR CIRCULATION: LEFT  vertebral artery is dominant. Basilar artery is patent, with normal flow related enhancement of the main branch vessels. Normal flow related enhancement of the posterior cerebral arteries. Small bilateral posterior communicating arteries present. No large vessel occlusion, high-grade stenosis, abnormal luminal irregularity, aneurysm. IMPRESSION: MRI HEAD: No acute intracranial process. Mild nonspecific white matter changes, atypical distribution for demyelination; can be associated with vasculopathy or migraine disorders. MRA HEAD: Negative. Electronically Signed   By: Awilda Metro M.D.   On: 07/08/2016 02:38  Mr Maxine Glenn Headm  Result Date: 07/08/2016 CLINICAL DATA:   Chest pain, recurrent episodes of tingling and numbness, extremity weakness for 2-3 weeks. On anti coagulation for coagulopathy and RIGHT atrial thrombus. Assess TIA. EXAM: MRI HEAD WITHOUT CONTRAST MRA HEAD WITHOUT CONTRAST TECHNIQUE: Multiplanar, multiecho pulse sequences of the brain and surrounding structures were obtained without intravenous contrast. Angiographic images of the head were obtained using MRA technique without contrast. COMPARISON:  CT HEAD July 07, 2016 FINDINGS: MRI HEAD FINDINGS INTRACRANIAL CONTENTS: No reduced diffusion to suggest acute ischemia. No susceptibility artifact to suggest hemorrhage. The ventricles and sulci are normal for patient's age. Greater than expected number of sub cm FLAIR T2 hyperintensities predominately and subcortical distribution. No masses or mass effect. No abnormal extra-axial fluid collections. No extra-axial masses though, contrast enhanced sequences would be more sensitive. Normal major intracranial vascular flow voids present at skull base. ORBITS: The included ocular globes and orbital contents are non-suspicious. SINUSES: The mastoid air-cells and included paranasal sinuses are well-aerated. SKULL/SOFT TISSUES: No abnormal sellar expansion. No suspicious calvarial bone marrow signal. Craniocervical junction maintained. Multiple small lymph nodes in the neck. Prominent palatine tonsils. MRA HEAD FINDINGS ANTERIOR CIRCULATION: Normal flow related enhancement of the included cervical, petrous, cavernous and supraclinoid internal carotid arteries. Patent anterior communicating artery. Normal flow related enhancement of the anterior and middle cerebral arteries, including distal segments. No large vessel occlusion, high-grade stenosis, abnormal luminal irregularity, aneurysm. POSTERIOR CIRCULATION: LEFT vertebral artery is dominant. Basilar artery is patent, with normal flow related enhancement of the main branch vessels. Normal flow related enhancement of the  posterior cerebral arteries. Small bilateral posterior communicating arteries present. No large vessel occlusion, high-grade stenosis, abnormal luminal irregularity, aneurysm. IMPRESSION: MRI HEAD: No acute intracranial process. Mild nonspecific white matter changes, atypical distribution for demyelination; can be associated with vasculopathy or migraine disorders. MRA HEAD: Negative. Electronically Signed   By: Awilda Metro M.D.   On: 07/08/2016 02:38  Ct Angio Chest/abd/pel For Dissection W And/or W/wo  Result Date: 07/07/2016 CLINICAL DATA:  Chest pain, intermittent right arm and leg numbness. History of clotting disorder with prior right atrial clot surgical removal. EXAM: CT ANGIO CHEST-ABD-PELV FOR DISSECTION W/ AND WO/W CM<Procedure Description>CT ANGIO CHEST-ABD-PELV FOR DISSECTION W/ AND WO/W CM TECHNIQUE: CT of the chest abdomen and pelvis without and with contrast, following angiogram of the thorax protocol. CONTRAST:  100 cc Isovue 370. COMPARISON:  CT of the chest 06/01/2006. FINDINGS: CHEST: The lungs are clear. There is no focal mass. There is no pleural effusion or pneumothorax. The heart size is normal. There is no pericardial effusion. Patient is status post median sternotomy. Surgical clips are seen within the anterior mediastinum. There is a residual soft tissue within the anterior mediastinum measuring 4.0 x 1.7 cm. No evidence of pulmonary embolus. Unremarkable appearance of the thoracic aorta and great vessels without dissection, aneurysm or periaortic fluid. The right atrium has heterogeneous appearance, which may be attributed to mixing of opacified and non-opacified blood from the superior  and inferior vena cava respectively. There are no pathologically enlarged mediastinal, hilar, or axillary lymph nodes. There is no lytic or blastic osseous lesion. ABDOMEN/PELVIS: The liver demonstrates no focal abnormality. There is no intrahepatic or extrahepatic biliary ductal dilatation. The  gallbladder is surgically absent. The spleen demonstrates no focal abnormality. The kidneys, adrenal glands and pancreas are normal. The bladder is unremarkable. The stomach, duodenum, small intestine, and large intestine demonstrates no contrast extravasation or dilatation. There is no pneumoperitoneum, pneumatosis, or portal venous gas. There is no abdominal or pelvic free fluid. There is no lymphadenopathy. The abdominal aorta is normal in caliber . Celiac trunk, superior mesenteric artery, bilateral renal arteries, inferior mesenteric artery, bilateral common iliac arteries and their attributes are normal. There is no lytic or blastic osseous lesion. IMPRESSION: CT THORAX IMPRESSION: No evidence of aortic dissection, aneurysm or periaortic fluid collection. No evidence of pulmonary embolus. Heterogeneous appearance of the right atrium. It is difficult to discern whether this represent mixing of opacified and non-opacified blood from the superior and inferior vena cava respectively, or right atrial clot. If right atrial clot is clinically suspected, cardiac echo should be considered. Postoperative changes in the anterior mediastinum with residual stable soft tissue thickening. No acute findings within the thorax. CT ABDOMEN/PELVIS IMPRESSION: No evidence of abdominal aortic dissection, aneurysm or periaortic fluid collection. Normal appearance of the named arteries within the abdomen and pelvis. Normal appearance of the solid abdominal and pelvic organs. Electronically Signed   By: Ted Mcalpine M.D.   On: 07/07/2016 19:14   Scheduled Meds: .  stroke: mapping our early stages of recovery book   Does not apply Once  . nicotine  21 mg Transdermal Daily  . pneumococcal 23 valent vaccine  0.5 mL Intramuscular Tomorrow-1000  . traZODone  100 mg Oral QHS  . Vilazodone HCl  20 mg Oral Daily   Continuous Infusions: . heparin 1,200 Units/hr (07/08/16 0804)     LOS: 0 days    Time spent: 20 minutes      Debbora Presto, MD [facility] Pager [phone]  If 7PM-7AM, please contact night-coverage [email or web address] Password [email or web address] 07/08/2016, 9:23 AM

## 2016-07-08 NOTE — Progress Notes (Signed)
Patient c/o difficulty swallowing. Patient states "I feel like I can't even swallow my saliva." During initial RN stroke swallow screen, patient's swallowing was WNL; however, when RN gave patient PO xanax prior to MRI, patient had some trouble swallowing it.   L Harduk NP paged about the above; will keep patient strictly NPO and continue to monitor. Suction set up at the bedside.

## 2016-07-08 NOTE — Progress Notes (Signed)
ANTICOAGULATION CONSULT NOTE - Follow Up Consult  Pharmacy Consult for heparin Indication: r/o apical thrombus vs TIA  Allergies  Allergen Reactions  . Fluoxetine Other (See Comments)    Pt told not to take with eliquis  . Ceftriaxone Itching and Other (See Comments)    Red man syndrome  . Gabapentin Other (See Comments)    Confusion/ chest pains  . Morphine And Related Itching and Nausea And Vomiting  . Nsaids Other (See Comments)    Pt told by hematologist not to take  . Topamax [Topiramate] Other (See Comments)    Dizziness, black out spells  . Toradol [Ketorolac Tromethamine] Itching and Other (See Comments)    Felt hot  . Vicodin [Hydrocodone-Acetaminophen] Other (See Comments)    hallucinations  . Depakote [Divalproex Sodium] Rash and Other (See Comments)    Skin felt like it was on fire  . Methocarbamol Rash  . Metoclopramide Rash and Hives    Patient Measurements: Height: 5\' 3"  (160 cm) Weight: 227 lb 9.6 oz (103.2 kg) IBW/kg (Calculated) : 52.4 Heparin Dosing Weight:   Vital Signs: Temp: 98.3 F (36.8 C) (07/31 1558) Temp Source: Oral (07/31 1558) BP: 108/69 (07/31 1558) Pulse Rate: 84 (07/31 1558)  Labs:  Recent Labs  07/07/16 1318 07/07/16 2202 07/08/16 0338 07/08/16 0602 07/08/16 1603  HGB 15.5*  --   --  14.9  --   HCT 45.9  --   --  46.3*  --   PLT 378  --   --  351  --   APTT  --  30  --   --   --   LABPROT  --  13.2  --   --   --   INR  --  1.00  --   --   --   HEPARINUNFRC  --   --   --  0.22* 0.27*  CREATININE 0.72  --   --   --   --   TROPONINI  --  <0.03 <0.03 <0.03  --     Estimated Creatinine Clearance: 122.3 mL/min (by C-G formula based on SCr of 0.8 mg/dL).   Medications:  Scheduled:  . nicotine  21 mg Transdermal Daily  . traZODone  100 mg Oral QHS  . Vilazodone HCl  20 mg Oral Daily   Infusions:  . heparin 1,200 Units/hr (07/08/16 1605)    Assessment: 25 yo female r/o apical thrombus vs TIA is currently on slightly  subtherapeutic heparin.  Heparin level is 0.27.   Goal of Therapy:  Heparin level 0.3-0.5 units/ml Monitor platelets by anticoagulation protocol: Yes   Plan:  - increase heparin drip to 1350 units/hr - 6 hr heparin level   Makaley Storts, Tsz-Yin 07/08/2016,5:20 PM

## 2016-07-08 NOTE — Progress Notes (Signed)
MBSS complete. Full report located under chart review in imaging section. No further SLP intervention needed, will s/o.  [name], M.A. CCC-SLP [phone]

## 2016-07-08 NOTE — Progress Notes (Signed)
STROKE TEAM PROGRESS NOTE   HISTORY OF PRESENT ILLNESS (per record) [name] is an 25 y.o. female with a history of coagulopathy, right atrial thrombus with surgical removal, coronary artery disease, PTSD, depression and tobacco abuse, presenting with chest pain as well as recurrent episodes of tingling and numbness as well as weakness involving right extremities over the past 2-3 weeks. Last episode occurred about 2 PM today 07/07/2016 ([facility]) and lasted a few minutes. CT scan of her head showed no acute intracranial abnormality. She was initially treated with Coumadin and subsequently was switched to Eliquis. She stopped taking Eliquis about one year ago and has not been on anticoagulation nor antiplatelet therapy. Patient was not administered IV t-PA secondary to deficits rapidly resolved. She was admitted for further evaluation and treatment.   SUBJECTIVE (INTERVAL HISTORY) No one is at the bedside.  Patient is lying in the bed, bed elevated in air. Her bed was lowered during rounds; she was educated to keep her bed low. She complains of a weak arm, but "doable". Overall she feels her condition is stable. Complains of right chest wall pain from right shoulder to right rib cage.    OBJECTIVE Temp:  [97.8 F (36.6 C)-98.5 F (36.9 C)] 97.8 F (36.6 C) (07/31 0740) Pulse Rate:  [63-112] 112 (07/31 0923) Cardiac Rhythm: Normal sinus rhythm (07/31 0700) Resp:  [12-24] 16 (07/31 0346) BP: (102-136)/(53-91) 110/53 (07/31 0740) SpO2:  [95 %-100 %] 100 % (07/31 0740) Weight:  [103.2 kg (227 lb 9.6 oz)-104 kg (229 lb 3.2 oz)] 103.2 kg (227 lb 9.6 oz) (07/31 0357)  CBC:  Recent Labs Lab 07/07/16 1318 07/08/16 0602  WBC 7.7 7.5  HGB 15.5* 14.9  HCT 45.9 46.3*  MCV 85.6 89.2  PLT 378 351    Basic Metabolic Panel:  Recent Labs Lab 07/07/16 1318  NA 135  K 3.9  CL 104  CO2 21*  GLUCOSE 100*  BUN 6  CREATININE 0.72  CALCIUM 8.9    Lipid Panel:    Component Value Date/Time    CHOL 183 07/08/2016 0338   TRIG 186 (H) 07/08/2016 0338   HDL 33 (L) 07/08/2016 0338   CHOLHDL 5.5 07/08/2016 0338   VLDL 37 07/08/2016 0338   LDLCALC 113 (H) 07/08/2016 0338   HgbA1c: No results found for: HGBA1C Urine Drug Screen:    Component Value Date/Time   LABOPIA POSITIVE (A) 07/08/2016 0410   COCAINSCRNUR NONE DETECTED 07/08/2016 0410   LABBENZ NONE DETECTED 07/08/2016 0410   AMPHETMU NONE DETECTED 07/08/2016 0410   THCU NONE DETECTED 07/08/2016 0410   LABBARB NONE DETECTED 07/08/2016 0410      IMAGING I have personally reviewed the radiological images below and agree with the radiology interpretations.  Dg Chest 2 View 07/07/2016 No active cardiopulmonary disease.   Ct Head Wo Contrast 07/07/2016 No acute intracranial abnormality.   MRI HEAD 07/08/2016 No acute intracranial process. Mild nonspecific white matter changes, atypical distribution for demyelination; can be associated with vasculopathy or migraine disorders.   MRA HEAD 07/08/2016 Negative.   CT THORAX  07/07/2016 No evidence of aortic dissection, aneurysm or periaortic fluid collection. No evidence of pulmonary embolus. Heterogeneous appearance of the right atrium. It is difficult to discern whether this represent mixing of opacified and non-opacified blood from the superior and inferior vena cava respectively, or right atrial clot. If right atrial clot is clinically suspected, cardiac echo should be considered. Postoperative changes in the anterior mediastinum with residual stable soft tissue thickening. No  acute findings within the thorax.   CT ABDOMEN/PELVIS  07/07/2016 No evidence of abdominal aortic dissection, aneurysm or periaortic fluid collection. Normal appearance of the named arteries within the abdomen and pelvis. Normal appearance of the solid abdominal and pelvic organs.   CUS - no significant extracranial carotid artery stenosis demonstrated. Vertebrals are patent with antegrade flow.  LE  venous doppler - negative for DVT  TTE - Left ventricle: The cavity size was normal. Systolic function was   normal. The estimated ejection fraction was in the range of 55%   to 60%. Wall motion was normal; there were no regional wall   motion abnormalities.  EEG - pending   TEE - pending   PHYSICAL EXAM  Temp:  [97.8 F (36.6 C)-98.4 F (36.9 C)] 98.2 F (36.8 C) (07/31 2022) Pulse Rate:  [64-112] 83 (07/31 2022) Resp:  [16] 16 (07/31 1558) BP: (102-124)/(52-69) 124/59 (07/31 2022) SpO2:  [96 %-100 %] 98 % (07/31 2022) Weight:  [227 lb 9.6 oz (103.2 kg)] 227 lb 9.6 oz (103.2 kg) (07/31 0357)  General - Well nourished, well developed, in no apparent distress.  Ophthalmologic - Sharp disc margins OU. .  Cardiovascular - Regular rate and rhythm.  Mental Status -  Level of arousal and orientation to time, place, and person were intact. Language including expression, naming, repetition, comprehension was assessed and found intact. Fund of Knowledge was assessed and was intact.  Cranial Nerves II - XII - II - Visual field intact OU. III, IV, VI - Extraocular movements intact. V - Facial sensation intact bilaterally. VII - Facial movement intact bilaterally. VIII - Hearing & vestibular intact bilaterally. X - Palate elevates symmetrically. XI - Chin turning & shoulder shrug intact bilaterally. XII - Tongue protrusion intact.  Motor Strength - The patient's strength was normal in LUE and LLE. RUE give away weakness, with drip on supination but not with pronation, LRLE giveaway weakness with positive hoover sign.  Bulk was normal and fasciculations were absent.   Motor Tone - Muscle tone was assessed at the neck and appendages and was normal.  Reflexes - The patient's reflexes were 1+ in all extremities and she had no pathological reflexes.  Sensory - Light touch, temperature/pinprick were assessed and positive on "yes" "no" test on the right.    Coordination - The patient  had normal movements in the hands with no ataxia or dysmetria.  Tremor was absent.  Gait and Station - deferred.   ASSESSMENT/PLAN [name] is a 25 y.o. female with history of R atrial clot, L Leg DVT and PTSD presenting with chest pain and intermittent R side weakness and numbness. She did not receive IV t-PA due to rapidly resolving deficits.   Hx of right atrium thrombosis s/p thrombectomy 2014 at [facility] and not compliant with anticoagulation  MRI  No acute stroke  MRA  Unremarkable   Carotid Doppler  unremarkable  2D Echo  EF 55-60%  LE doppler no DVT  EEG pending  TEE pending  LDL 113  HgbA1c pending  IV heparin for VTE prophylaxis  Diet full liquid Room service appropriate? Yes; Fluid consistency: Thin  No antithrombotic prior to admission (previously on coumadin, changed to Eliquis - stopped 07/2015 d/t financial issues), now on heparin IV. Consider transition to po anticoagulation once work up finished.  Patient counseled to be compliant with her antithrombotic medications  Ongoing aggressive stroke risk factor management  Therapy recommendations:  NO OT, SLP; HH PT  Disposition:  Return home  ? Clotting disorder  2012 left LE DVT  2014 right atrium thrombosis  TEE pending this time  On coumadin -> eliquis, off AC for one year due to financial difficulty  On heparin IV now, consider transition to po AC once work up finished.  Right sided giveaway weakness - likely conversion  Give away weakness  Positive hoover sign and "yes no" test and supination > pronation weakness  MRI negative for stroke  On heparin IV  Hyperlipidemia  Home meds:  No statin  LDL 113, goal < 100  Add lipitor 10mg    Continue lipitor on discharge  Tobacco abuse  Current smoker  Smoking cessation counseling provided  Started smoking at age 16. Stopped twice in her life, once for pregnancy.   Nicotine patch  Pt is willing to quit   Other Stroke Risk  Factors  Morbid Obesity, Body mass index is 40.32 kg/m., recommend weight loss, diet and exercise as appropriate   Hx VTE L leg in 2012   Other Active Problems  UDS positive for opiates  Chest pain  Depression, hx PTSD  Nausea and vomiting  Hospital day # 0   Marvel Plan, MD PhD Stroke Neurology 07/08/2016 10:58 PM   To contact Stroke Continuity provider, please refer to [email or web address]. After hours, contact General Neurology

## 2016-07-08 NOTE — Progress Notes (Signed)
ANTICOAGULATION CONSULT NOTE - Follow Up Consult  Pharmacy Consult for heparin Indication: r/o apical thrombus vs TIA  Labs:  Recent Labs  07/07/16 1318 07/07/16 2202 07/08/16 0338 07/08/16 0602  HGB 15.5*  --   --  14.9  HCT 45.9  --   --  46.3*  PLT 378  --   --  351  APTT  --  30  --   --   LABPROT  --  13.2  --   --   INR  --  1.00  --   --   HEPARINUNFRC  --   --   --  0.22*  CREATININE 0.72  --   --   --   TROPONINI  --  <0.03 <0.03 <0.03    Assessment: 24yo female subtherapeutic on heparin with initial dosing for apical thrombus vs TIA w/ past h/o apical thrombus and noncompliance w/ anticoag.  Goal of Therapy:  Heparin level 0.3-0.5 units/ml   Plan:  Will increase heparin gtt by 2 units/kg/hr to 1200 units/hr and check level in 6hr.  Vernard Gambles, PharmD, BCPS  07/08/2016,7:51 AM

## 2016-07-08 NOTE — Progress Notes (Signed)
**  Preliminary report by tech**  Right lower extremity venous duplex completed. No evidence of deep or superficial vein thrombosis noted in the right lower extremity. All visualized vessels appear patent and compressible. There is no evidence of a Baker's cyst on the right.  07/08/16 3:43 PM Olen Cordial RVT

## 2016-07-08 NOTE — Evaluation (Addendum)
Physical Therapy Evaluation Patient Details Name: [name] MRN: [id number] DOB: Mar 02, 1991 Today's Date: 07/08/2016   History of Present Illness  25 y.o. female with h/o coagulopathy, R atrial thrombus with surgical removal, PTSD, depression, CAD admitted with chest pain and R sided numbness/weakness for 2-3 weeks. CT and MRI of head negative for acute infarct.   Clinical Impression  Pt admitted with above diagnosis. Pt currently with functional limitations due to the deficits listed below (see PT Problem List). Pt ambulated 14' with RW, HR 112, distance limited by 8/10 chest pain. Decreased sensation to light touch RLE. RUE/LE grossly weak (+3/5). Good progress expected once pain/nausea diminish.  Pt will benefit from skilled PT to increase their independence and safety with mobility to allow discharge to the venue listed below.       Follow Up Recommendations Supervision for mobility/OOB; HHPT    Equipment Recommendations  Rolling walker with 5" wheels    Recommendations for Other Services       Precautions / Restrictions Precautions Precautions: Fall Precaution Comments: monitor vitals, pt denies h/o falls in past 1 year Restrictions Weight Bearing Restrictions: No      Mobility  Bed Mobility Overal bed mobility: Modified Independent             General bed mobility comments: sit to supine mod I using rail, HOB up 35*  Transfers Overall transfer level: Needs assistance Equipment used: Rolling walker (2 wheeled) Transfers: Sit to/from Stand Sit to Stand: Supervision         General transfer comment: supervision for safety due to RLE weakness  Ambulation/Gait Ambulation/Gait assistance: Supervision Ambulation Distance (Feet): 14 Feet Assistive device: Rolling walker (2 wheeled) Gait Pattern/deviations: Decreased step length - right;Decreased step length - left   Gait velocity interpretation: Below normal speed for age/gender General Gait Details:  distance limited by 8/10 CP, fatigue; HR 112 walking bathroom to bed  Stairs            Wheelchair Mobility    Modified Rankin (Stroke Patients Only)       Balance Overall balance assessment: Needs assistance   Sitting balance-Leahy Scale: Good       Standing balance-Leahy Scale: Fair                               Pertinent Vitals/Pain Pain Assessment: 0-10 Pain Score: 8  Pain Location: R chest Pain Descriptors / Indicators: Aching Pain Intervention(s): Limited activity within patient's tolerance;Monitored during session;Premedicated before session;Patient requesting pain meds-RN notified    Home Living Family/patient expects to be discharged to:: Private residence Living Arrangements: Alone Available Help at Discharge: Family;Available PRN/intermittently   Home Access: Stairs to enter Entrance Stairs-Rails: None Entrance Stairs-Number of Steps: 3 Home Layout: One level Home Equipment: None      Prior Function Level of Independence: Independent               Hand Dominance   Dominant Hand: Right    Extremity/Trunk Assessment   Upper Extremity Assessment: RUE deficits/detail RUE Deficits / Details: shoulder elevation AROM 90* limited by pain, +3/5 shoulder strength, grip +3/5, sensation intact to light touch RUE         Lower Extremity Assessment: RLE deficits/detail RLE Deficits / Details: sensation decreased to light touch R foot, knee, thigh; ankle DF +3/5; knee/hip NT due to pt having nausea and pain, will assess next visit    Cervical / Trunk Assessment: Normal  Communication   Communication: No difficulties  Cognition Arousal/Alertness: Awake/alert Behavior During Therapy: WFL for tasks assessed/performed Overall Cognitive Status: Within Functional Limits for tasks assessed                      General Comments      Exercises        Assessment/Plan    PT Assessment Patient needs continued PT services   PT Diagnosis Difficulty walking;Acute pain;Hemiplegia dominant side   PT Problem List Decreased strength;Decreased activity tolerance;Decreased range of motion;Decreased balance;Decreased knowledge of use of DME;Impaired sensation;Decreased mobility  PT Treatment Interventions DME instruction;Gait training;Stair training;Functional mobility training;Balance training;Therapeutic activities;Therapeutic exercise;Patient/family education   PT Goals (Current goals can be found in the Care Plan section) Acute Rehab PT Goals Patient Stated Goal: decrease pain/nausea; return to independence PT Goal Formulation: With patient Time For Goal Achievement: 07/22/16 Potential to Achieve Goals: Good    Frequency Min 3X/week   Barriers to discharge        Co-evaluation               End of Session Equipment Utilized During Treatment: Gait belt Activity Tolerance: Patient limited by pain Patient left: in bed;with call bell/phone within reach Nurse Communication: Mobility status    Functional Assessment Tool Used: clinical judgement Functional Limitation: Mobility: Walking and moving around Mobility: Walking and Moving Around Current Status ([id number]): At least 40 percent but less than 60 percent impaired, limited or restricted Mobility: Walking and Moving Around Goal Status [phone]): At least 1 percent but less than 20 percent impaired, limited or restricted    Time: 0910-0920 PT Time Calculation (min) (ACUTE ONLY): 10 min   Charges:   PT Evaluation $PT Eval Low Complexity: 1 Procedure     PT G Codes:   PT G-Codes **NOT FOR INPATIENT CLASS** Functional Assessment Tool Used: clinical judgement Functional Limitation: Mobility: Walking and moving around Mobility: Walking and Moving Around Current Status ([id number]): At least 40 percent but less than 60 percent impaired, limited or restricted Mobility: Walking and Moving Around Goal Status [phone]): At least 1 percent but less than 20 percent  impaired, limited or restricted    Tamala Ser 07/08/2016, 9:34 AM [phone]

## 2016-07-09 ENCOUNTER — Encounter (HOSPITAL_COMMUNITY)
Admission: EM | Disposition: A | Discharge status: Home / Self Care | Payer: Self-pay | Source: Home / Self Care | Attending: Internal Medicine

## 2016-07-09 ENCOUNTER — Inpatient Hospital Stay (HOSPITAL_COMMUNITY)
Admit: 2016-07-09 | Discharge: 2016-07-09 | Disposition: A | Discharge status: Home / Self Care | Payer: Self-pay | Attending: Cardiovascular Disease | Admitting: Cardiovascular Disease

## 2016-07-09 ENCOUNTER — Encounter (HOSPITAL_COMMUNITY): Payer: Self-pay

## 2016-07-09 DIAGNOSIS — E785 Hyperlipidemia, unspecified: Secondary | ICD-10-CM

## 2016-07-09 DIAGNOSIS — R200 Anesthesia of skin: Secondary | ICD-10-CM | POA: Diagnosis not present

## 2016-07-09 HISTORY — PX: TEE WITHOUT CARDIOVERSION: SHX5443

## 2016-07-09 LAB — BASIC METABOLIC PANEL
Anion gap: 3 — ABNORMAL LOW (ref 5–15)
CALCIUM: 8.7 mg/dL — AB (ref 8.9–10.3)
CO2: 32 mmol/L (ref 22–32)
CREATININE: 0.83 mg/dL (ref 0.44–1.00)
Chloride: 101 mmol/L (ref 101–111)
GFR calc Af Amer: >60 mL/min (ref >60)
GLUCOSE: 85 mg/dL (ref 65–99)
POTASSIUM: 4 mmol/L (ref 3.5–5.1)
Sodium: 136 mmol/L (ref 135–145)

## 2016-07-09 LAB — HEMOGLOBIN A1C
Hgb A1c MFr Bld: 5.5 % (ref 4.8–5.6)
MEAN PLASMA GLUCOSE: 111 mg/dL

## 2016-07-09 LAB — URINE CULTURE

## 2016-07-09 LAB — CBC
HCT: 43.6 % (ref 36.0–46.0)
HEMOGLOBIN: 13.5 g/dL (ref 12.0–15.0)
MCH: 27.6 pg (ref 26.0–34.0)
MCHC: 31 g/dL (ref 30.0–36.0)
MCV: 89.2 fL (ref 78.0–100.0)
Platelets: 338 10*3/uL (ref 150–400)
RBC: 4.89 MIL/uL (ref 3.87–5.11)
RDW: 13.5 % (ref 11.5–15.5)
WBC: 7.1 10*3/uL (ref 4.0–10.5)

## 2016-07-09 LAB — HEPARIN LEVEL (UNFRACTIONATED): HEPARIN UNFRACTIONATED: 0.48 [IU]/mL (ref 0.30–0.70)

## 2016-07-09 LAB — GLUCOSE, CAPILLARY: Glucose-Capillary: 97 mg/dL (ref 65–99)

## 2016-07-09 SURGERY — ECHOCARDIOGRAM, TRANSESOPHAGEAL
Anesthesia: Moderate Sedation

## 2016-07-09 MED ORDER — HYDROMORPHONE HCL 1 MG/ML IJ SOLN
1.0000 mg | INTRAMUSCULAR | Status: DC | PRN
  Administered 2016-07-09 – 2016-07-10 (×5): 1 mg via INTRAVENOUS
  Filled 2016-07-09 (×5): qty 1

## 2016-07-09 MED ORDER — FENTANYL CITRATE (PF) 100 MCG/2ML IJ SOLN
INTRAMUSCULAR | Status: DC | PRN
  Administered 2016-07-09 (×2): 50 ug via INTRAVENOUS

## 2016-07-09 MED ORDER — FENTANYL CITRATE (PF) 100 MCG/2ML IJ SOLN
INTRAMUSCULAR | Status: AC
  Filled 2016-07-09: qty 2

## 2016-07-09 MED ORDER — MIDAZOLAM HCL 10 MG/2ML IJ SOLN
INTRAMUSCULAR | Status: DC | PRN
  Administered 2016-07-09 (×2): 1 mg via INTRAVENOUS

## 2016-07-09 MED ORDER — MIDAZOLAM HCL 5 MG/ML IJ SOLN
INTRAMUSCULAR | Status: AC
  Filled 2016-07-09: qty 2

## 2016-07-09 MED ORDER — BUTAMBEN-TETRACAINE-BENZOCAINE 2-2-14 % EX AERO
INHALATION_SPRAY | CUTANEOUS | Status: DC | PRN
  Administered 2016-07-09: 2 via TOPICAL

## 2016-07-09 MED ORDER — SODIUM CHLORIDE 0.9 % IV SOLN: INTRAVENOUS | Status: DC

## 2016-07-09 NOTE — Progress Notes (Signed)
STROKE TEAM PROGRESS NOTE   SUBJECTIVE (INTERVAL HISTORY) patient lying in the bed. No new complaints to neuro. Per RN, she complains of pain with medication adjustment by primary. Still complains of right UE and LE numbness and heaviness.    OBJECTIVE Temp:  [97.8 F (36.6 C)-99.2 F (37.3 C)] 98.8 F (37.1 C) (08/01 0920) Pulse Rate:  [80-92] 92 (08/01 0920) Cardiac Rhythm: Normal sinus rhythm (08/01 0755) Resp:  [16] 16 (08/01 0011) BP: (100-124)/(45-69) 106/45 (08/01 0920) SpO2:  [98 %-100 %] 100 % (08/01 0920) Weight:  [104.9 kg (231 lb 3.2 oz)] 104.9 kg (231 lb 3.2 oz) (08/01 0401)  CBC:   Recent Labs Lab 07/08/16 0602 07/09/16 0346  WBC 7.5 7.1  HGB 14.9 13.5  HCT 46.3* 43.6  MCV 89.2 89.2  PLT 351 338    Basic Metabolic Panel:   Recent Labs Lab 07/07/16 1318 07/09/16 0346  NA 135 136  K 3.9 4.0  CL 104 101  CO2 21* 32  GLUCOSE 100* 85  BUN 6 <5*  CREATININE 0.72 0.83  CALCIUM 8.9 8.7*    Lipid Panel:     Component Value Date/Time   CHOL 183 07/08/2016 0338   TRIG 186 (H) 07/08/2016 0338   HDL 33 (L) 07/08/2016 0338   CHOLHDL 5.5 07/08/2016 0338   VLDL 37 07/08/2016 0338   LDLCALC 113 (H) 07/08/2016 0338   HgbA1c:  Lab Results  Component Value Date   HGBA1C 5.5 07/08/2016   Urine Drug Screen:     Component Value Date/Time   LABOPIA POSITIVE (A) 07/08/2016 0410   COCAINSCRNUR NONE DETECTED 07/08/2016 0410   LABBENZ NONE DETECTED 07/08/2016 0410   AMPHETMU NONE DETECTED 07/08/2016 0410   THCU NONE DETECTED 07/08/2016 0410   LABBARB NONE DETECTED 07/08/2016 0410      IMAGING Dr. Roda Shutters has personally reviewed the radiological images below and agree with the radiology interpretations.  Dg Chest 2 View 07/07/2016 No active cardiopulmonary disease.   Ct Head Wo Contrast 07/07/2016 No acute intracranial abnormality.   MRI HEAD 07/08/2016 No acute intracranial process. Mild nonspecific white matter changes, atypical distribution for  demyelination; can be associated with vasculopathy or migraine disorders.   MRA HEAD 07/08/2016 Negative.   CT THORAX  07/07/2016 No evidence of aortic dissection, aneurysm or periaortic fluid collection. No evidence of pulmonary embolus. Heterogeneous appearance of the right atrium. It is difficult to discern whether this represent mixing of opacified and non-opacified blood from the superior and inferior vena cava respectively, or right atrial clot. If right atrial clot is clinically suspected, cardiac echo should be considered. Postoperative changes in the anterior mediastinum with residual stable soft tissue thickening. No acute findings within the thorax.   CT ABDOMEN/PELVIS  07/07/2016 No evidence of abdominal aortic dissection, aneurysm or periaortic fluid collection. Normal appearance of the named arteries within the abdomen and pelvis. Normal appearance of the solid abdominal and pelvic organs.   CUS - no significant extracranial carotid artery stenosis demonstrated. Vertebrals are patent with antegrade flow.  LE venous doppler - negative for DVT  TTE - Left ventricle: The cavity size was normal. Systolic function was  normal. The estimated ejection fraction was in the range of 55%  to 60%. Wall motion was normal; there were no regional wall  motion abnormalities.  EEG - The EKG channel demonstrates are regular rhythm with a rate of 90 beats per minute.   The background consists predominately of theta activity with some intermixed delta. She is  awake only briefly near the end of the study. The best dominant posterior rhythm is 9 Hz. This is symmetric and reacts as expected with eye opening.   There are no focal asymmetries. No epileptiform discharges are present. No seizures are recorded.   Drowsiness is recorded and is normal in appearance. Early stages of sleep are recorded and demonstrate vertex sharps without clear sleep spindles.   TEE - 1  Small Mobile thrombus in RA 2.  Mild to moderate TR. 3. Normal LV and RV systolic function.Marland Kitchen   PHYSICAL EXAM General - Well nourished, well developed, in no apparent distress.  Ophthalmologic - Sharp disc margins OU. .  Cardiovascular - Regular rate and rhythm.  Mental Status -  Level of arousal and orientation to time, place, and person were intact. Language including expression, naming, repetition, comprehension was assessed and found intact. Fund of Knowledge was assessed and was intact.  Cranial Nerves II - XII - II - Visual field intact OU. III, IV, VI - Extraocular movements intact. V - Facial sensation intact bilaterally. VII - Facial movement intact bilaterally. VIII - Hearing & vestibular intact bilaterally. X - Palate elevates symmetrically. XI - Chin turning & shoulder shrug intact bilaterally. XII - Tongue protrusion intact.  Motor Strength - The patient's strength was normal in LUE and LLE. RUE give away weakness, with drip on supination but not with pronation, LRLE giveaway weakness with positive hoover sign.  Bulk was normal and fasciculations were absent.   Motor Tone - Muscle tone was assessed at the neck and appendages and was normal.  Reflexes - The patient's reflexes were 1+ in all extremities and she had no pathological reflexes.  Sensory - Light touch, temperature/pinprick were assessed and positive on "yes" "no" test on the right.    Coordination - The patient had normal movements in the hands with no ataxia or dysmetria.  Tremor was absent.  Gait and Station - deferred.   ASSESSMENT/PLAN [name] is a 25 y.o. female with history of R atrial clot, L Leg DVT and PTSD presenting with chest pain and intermittent R side weakness and numbness. She did not receive IV t-PA due to rapidly resolving deficits.   Small right atrium thrombus on TEE  Had previous RA thrombus s/p thrombectomy 2014 at [facility] and not compliant with anticoagulation  MRI  No acute stroke  MRA   Unremarkable   Carotid Doppler  unremarkable  2D Echo  EF 55-60%  LE doppler no DVT  EEG unremarkable awake, drowsy, and sleep EEG  TEE RA mobile small thrombus present  LDL 113  HgbA1c 5.5  IV heparin for VTE prophylaxis Diet NPO time specified  No antithrombotic prior to admission (previously on coumadin, changed to Eliquis - stopped 07/2015 d/t financial issues), now on heparin IV. Consider transition to po anticoagulation such as eliquis.  Patient counseled to be compliant with her antithrombotic medications  Ongoing aggressive stroke risk factor management  Therapy recommendations:  NO OT, SLP; HH PT, RW w/ 5" wheels  Disposition:  Return home  Clotting disorder  2012 left LE DVT  2014 right atrium thrombosis  TEE pending this time  On coumadin -> eliquis, off AC for one year due to financial difficulty  On heparin IV now, consider transition to eliquis.  Social worker to help financial support for eliquis.  Right sided giveaway weakness - likely conversion  Give away weakness  Positive hoover sign and "yes no" test and supination > pronation weakness  MRI negative for stroke  On heparin IV  Hyperlipidemia  Home meds:  No statin  LDL 113, goal < 100  Add lipitor 10mg    Continue lipitor on discharge  Tobacco abuse  Current smoker  Smoking cessation counseling provided  Started smoking at age 77. Stopped twice in her life, once for pregnancy.   Nicotine patch  Pt is willing to quit   Other Stroke Risk Factors  Morbid Obesity, Body mass index is 40.96 kg/m., recommend weight loss, diet and exercise as appropriate   Hx VTE L leg in 2012   Other Active Problems  UDS positive for opiates  Chest pain  Depression, hx PTSD  Nausea and vomiting  Hospital day # 1  Neurology will sign off. Please call with questions. Pt will follow up with Dr. Roda Shutters at [facility] in about 2 months. Thanks for the consult.  Marvel Plan, MD PhD Stroke  Neurology 07/09/2016 5:29 PM    To contact Stroke Continuity provider, please refer to [email or web address]. After hours, contact General Neurology

## 2016-07-09 NOTE — Interval H&P Note (Signed)
History and Physical Interval Note:  07/09/2016 3:10 PM  [name]  has presented today for surgery, with the diagnosis of possible thrombus  The various methods of treatment have been discussed with the patient and family. After consideration of risks, benefits and other options for treatment, the patient has consented to  Procedure(s): TRANSESOPHAGEAL ECHOCARDIOGRAM (TEE) (N/A) as a surgical intervention .  The patient's history has been reviewed, patient examined, no change in status, stable for surgery.  I have reviewed the patient's chart and labs.  Questions were answered to the patient's satisfaction.     [name]

## 2016-07-09 NOTE — H&P (View-Only) (Signed)
Referring Physician: Dr. Doyle Askew, [facility]  [name] is an 25 y.o. female.                       Chief Complaint: Chest pain  HPI: 25 year old female has history of right atrial thrombus with surgical removal at [facility] in [place] in 2014, treated with warfarin followed by Eliquis until last year when ran out of medication due to high cost. She came with right sided mid axillary line between 5th and 6th rib area, sharp, constant and radiating to right shoulder area. Some shortness of breath with activity but negative CT chest for PE and Right lower extremity ultrasound for DVT. Her Troponin-I x 3 are normal. Her EKG is normal sinus rhythm with low voltage. Transthoracic echocardiogram has normal LV systolic function and no definite evidence of clot in right and or left atrium. No fever or cough. Denies any injury to chest or lifting, pulling or pushing heavy objects.  Past Medical History:  Diagnosis Date  . Asthma   . Coronary artery disease    clotting disorder  . Depression   . PTSD (post-traumatic stress disorder)   . Thrombosis of right atrium (HCC)   . Tobacco abuse       Past Surgical History:  Procedure Laterality Date  . CARDIAC SURGERY    . CHOLECYSTECTOMY      Family History  Problem Relation Age of Onset  . Bipolar disorder Mother   . Glaucoma Mother   . Arthritis Father    Social History:  reports that she has been smoking.  She has never used smokeless tobacco. She reports that she does not drink alcohol. Her drug history is not on file.  Allergies:  Allergies  Allergen Reactions  . Fluoxetine Other (See Comments)    Pt told not to take with eliquis  . Ceftriaxone Itching and Other (See Comments)    Red man syndrome  . Gabapentin Other (See Comments)    Confusion/ chest pains  . Morphine And Related Itching and Nausea And Vomiting  . Nsaids Other (See Comments)    Pt told by hematologist not to take  . Topamax  [Topiramate] Other (See Comments)    Dizziness, black out spells  . Toradol [Ketorolac Tromethamine] Itching and Other (See Comments)    Felt hot  . Vicodin [Hydrocodone-Acetaminophen] Other (See Comments)    hallucinations  . Depakote [Divalproex Sodium] Rash and Other (See Comments)    Skin felt like it was on fire  . Methocarbamol Rash  . Metoclopramide Rash and Hives    Medications Prior to Admission  Medication Sig Dispense Refill  . acetaminophen (TYLENOL) 500 MG tablet Take 1,000-1,500 mg by mouth every 4 (four) hours as needed (pain).    . traZODone (DESYREL) 100 MG tablet Take 100 mg by mouth at bedtime.    . Vilazodone HCl (VIIBRYD) 20 MG TABS Take 20 mg by mouth daily.    Marland Kitchen oxyCODONE-acetaminophen (PERCOCET/ROXICET) 5-325 MG tablet Take 2 tablets by mouth every 4 (four) hours as needed for severe pain. (Patient not taking: Reported on 07/07/2016) 12 tablet 0    Results for orders placed or performed during the hospital encounter of 07/07/16 (from the past 48 hour(s))  Basic metabolic panel     Status: Abnormal   Collection Time: 07/07/16  1:18 PM  Result Value Ref Range   Sodium 135 135 - 145 mmol/L   Potassium 3.9 3.5 - 5.1  mmol/L   Chloride 104 101 - 111 mmol/L   CO2 21 (L) 22 - 32 mmol/L   Glucose, Bld 100 (H) 65 - 99 mg/dL   BUN 6 6 - 20 mg/dL   Creatinine, Ser 0.72 0.44 - 1.00 mg/dL   Calcium 8.9 8.9 - 10.3 mg/dL   GFR calc non Af Amer >60 >60 mL/min   GFR calc Af Amer >60 >60 mL/min    Comment: (NOTE) The eGFR has been calculated using the CKD EPI equation. This calculation has not been validated in all clinical situations. eGFR's persistently <60 mL/min signify possible Chronic Kidney Disease.    Anion gap 10 5 - 15  CBC     Status: Abnormal   Collection Time: 07/07/16  1:18 PM  Result Value Ref Range   WBC 7.7 4.0 - 10.5 K/uL   RBC 5.36 (H) 3.87 - 5.11 MIL/uL   Hemoglobin 15.5 (H) 12.0 - 15.0 g/dL   HCT 45.9 36.0 - 46.0 %   MCV 85.6 78.0 - 100.0 fL    MCH 28.9 26.0 - 34.0 pg   MCHC 33.8 30.0 - 36.0 g/dL   RDW 13.7 11.5 - 15.5 %   Platelets 378 150 - 400 K/uL  I-stat troponin, ED     Status: None   Collection Time: 07/07/16  1:24 PM  Result Value Ref Range   Troponin i, poc 0.00 0.00 - 0.08 ng/mL   Comment 3            Comment: Due to the release kinetics of cTnI, a negative result within the first hours of the onset of symptoms does not rule out myocardial infarction with certainty. If myocardial infarction is still suspected, repeat the test at appropriate intervals.   I-Stat beta hCG blood, ED     Status: None   Collection Time: 07/07/16  3:21 PM  Result Value Ref Range   I-stat hCG, quantitative <5.0 <5 mIU/mL   Comment 3            Comment:   GEST. AGE      CONC.  (mIU/mL)   <=1 WEEK        5 - 50     2 WEEKS       50 - 500     3 WEEKS       100 - 10,000     4 WEEKS     1,000 - 30,000        FEMALE AND NON-PREGNANT FEMALE:     LESS THAN 5 mIU/mL   I-stat troponin, ED     Status: None   Collection Time: 07/07/16  7:04 PM  Result Value Ref Range   Troponin i, poc 0.00 0.00 - 0.08 ng/mL   Comment 3            Comment: Due to the release kinetics of cTnI, a negative result within the first hours of the onset of symptoms does not rule out myocardial infarction with certainty. If myocardial infarction is still suspected, repeat the test at appropriate intervals.   Protime-INR     Status: None   Collection Time: 07/07/16 10:02 PM  Result Value Ref Range   Prothrombin Time 13.2 11.4 - 15.2 seconds   INR 1.00   APTT     Status: None   Collection Time: 07/07/16 10:02 PM  Result Value Ref Range   aPTT 30 24 - 36 seconds  Troponin I (q 6hr x 3)  Status: None   Collection Time: 07/07/16 10:02 PM  Result Value Ref Range   Troponin I <0.03 <0.03 ng/mL  Troponin I (q 6hr x 3)     Status: None   Collection Time: 07/08/16  3:38 AM  Result Value Ref Range   Troponin I <0.03 <0.03 ng/mL  Lipase, blood     Status: None    Collection Time: 07/08/16  3:38 AM  Result Value Ref Range   Lipase 18 11 - 51 U/L  Lipid panel     Status: Abnormal   Collection Time: 07/08/16  3:38 AM  Result Value Ref Range   Cholesterol 183 0 - 200 mg/dL   Triglycerides 186 (H) <150 mg/dL   HDL 33 (L) >40 mg/dL   Total CHOL/HDL Ratio 5.5 RATIO   VLDL 37 0 - 40 mg/dL   LDL Cholesterol 113 (H) 0 - 99 mg/dL    Comment:        Total Cholesterol/HDL:CHD Risk Coronary Heart Disease Risk Table                     Men   Women  1/2 Average Risk   3.4   3.3  Average Risk       5.0   4.4  2 X Average Risk   9.6   7.1  3 X Average Risk  23.4   11.0        Use the calculated Patient Ratio above and the CHD Risk Table to determine the patient's CHD Risk.        ATP III CLASSIFICATION (LDL):  <100     mg/dL   Optimal  100-129  mg/dL   Near or Above                    Optimal  130-159  mg/dL   Borderline  160-189  mg/dL   High  >190     mg/dL   Very High   Urinalysis, Routine w reflex microscopic (not at [facility])     Status: Abnormal   Collection Time: 07/08/16  4:10 AM  Result Value Ref Range   Color, Urine YELLOW YELLOW   APPearance CLOUDY (A) CLEAR   Specific Gravity, Urine >1.046 (H) 1.005 - 1.030   pH 5.5 5.0 - 8.0   Glucose, UA NEGATIVE NEGATIVE mg/dL   Hgb urine dipstick NEGATIVE NEGATIVE   Bilirubin Urine NEGATIVE NEGATIVE   Ketones, ur NEGATIVE NEGATIVE mg/dL   Protein, ur NEGATIVE NEGATIVE mg/dL   Nitrite NEGATIVE NEGATIVE   Leukocytes, UA SMALL (A) NEGATIVE  Urine rapid drug screen (hosp performed)     Status: Abnormal   Collection Time: 07/08/16  4:10 AM  Result Value Ref Range   Opiates POSITIVE (A) NONE DETECTED   Cocaine NONE DETECTED NONE DETECTED   Benzodiazepines NONE DETECTED NONE DETECTED   Amphetamines NONE DETECTED NONE DETECTED   Tetrahydrocannabinol NONE DETECTED NONE DETECTED   Barbiturates NONE DETECTED NONE DETECTED    Comment:        DRUG SCREEN FOR MEDICAL PURPOSES ONLY.  IF CONFIRMATION IS  NEEDED FOR ANY PURPOSE, NOTIFY LAB WITHIN 5 DAYS.        LOWEST DETECTABLE LIMITS FOR URINE DRUG SCREEN Drug Class       Cutoff (ng/mL) Amphetamine      1000 Barbiturate      200 Benzodiazepine   161 Tricyclics       096 Opiates  300 Cocaine          300 THC              50   Urine microscopic-add on     Status: Abnormal   Collection Time: 07/08/16  4:10 AM  Result Value Ref Range   Squamous Epithelial / LPF 6-30 (A) NONE SEEN   WBC, UA 6-30 0 - 5 WBC/hpf   RBC / HPF 0-5 0 - 5 RBC/hpf   Bacteria, UA MANY (A) NONE SEEN  Troponin I (q 6hr x 3)     Status: None   Collection Time: 07/08/16  6:02 AM  Result Value Ref Range   Troponin I <0.03 <0.03 ng/mL  Heparin level (unfractionated)     Status: Abnormal   Collection Time: 07/08/16  6:02 AM  Result Value Ref Range   Heparin Unfractionated 0.22 (L) 0.30 - 0.70 IU/mL    Comment:        IF HEPARIN RESULTS ARE BELOW EXPECTED VALUES, AND PATIENT DOSAGE HAS BEEN CONFIRMED, SUGGEST FOLLOW UP TESTING OF ANTITHROMBIN III LEVELS.   CBC     Status: Abnormal   Collection Time: 07/08/16  6:02 AM  Result Value Ref Range   WBC 7.5 4.0 - 10.5 K/uL   RBC 5.19 (H) 3.87 - 5.11 MIL/uL   Hemoglobin 14.9 12.0 - 15.0 g/dL   HCT 46.3 (H) 36.0 - 46.0 %   MCV 89.2 78.0 - 100.0 fL   MCH 28.7 26.0 - 34.0 pg   MCHC 32.2 30.0 - 36.0 g/dL   RDW 14.0 11.5 - 15.5 %   Platelets 351 150 - 400 K/uL  Glucose, capillary     Status: None   Collection Time: 07/08/16 10:15 AM  Result Value Ref Range   Glucose-Capillary 85 65 - 99 mg/dL   Comment 1 Notify RN    Comment 2 Document in Chart   Heparin level (unfractionated)     Status: Abnormal   Collection Time: 07/08/16  4:03 PM  Result Value Ref Range   Heparin Unfractionated 0.27 (L) 0.30 - 0.70 IU/mL    Comment:        IF HEPARIN RESULTS ARE BELOW EXPECTED VALUES, AND PATIENT DOSAGE HAS BEEN CONFIRMED, SUGGEST FOLLOW UP TESTING OF ANTITHROMBIN III LEVELS.    Dg Chest 2 View  Result  Date: 07/07/2016 CLINICAL DATA:  Chest pain, cough beginning last night. EXAM: CHEST  2 VIEW COMPARISON:  06/01/2016 FINDINGS: Prior median sternotomy. Heart and mediastinal contours are within normal limits. No focal opacities or effusions. No acute bony abnormality. IMPRESSION: No active cardiopulmonary disease. Electronically Signed   By: Rolm Baptise M.D.   On: 07/07/2016 14:12  Ct Head Wo Contrast  Result Date: 07/07/2016 CLINICAL DATA:  Intermittent right arm and leg numbness. EXAM: CT HEAD WITHOUT CONTRAST TECHNIQUE: Contiguous axial images were obtained from the base of the skull through the vertex without intravenous contrast. COMPARISON:  None. FINDINGS: Brain: No evidence of acute infarction, hemorrhage, extra-axial collection, ventriculomegaly, or mass effect. Vascular: No hyperdense vessel or unexpected calcification. Skull: Negative for fracture or focal lesion. Sinuses/Orbits: No acute findings. Other: None. IMPRESSION: No acute intracranial abnormality. Electronically Signed   By: Fidela Salisbury M.D.   On: 07/07/2016 18:42  Mr Brain Wo Contrast  Result Date: 07/08/2016 CLINICAL DATA:  Chest pain, recurrent episodes of tingling and numbness, extremity weakness for 2-3 weeks. On anti coagulation for coagulopathy and RIGHT atrial thrombus. Assess TIA. EXAM: MRI HEAD WITHOUT CONTRAST MRA HEAD  WITHOUT CONTRAST TECHNIQUE: Multiplanar, multiecho pulse sequences of the brain and surrounding structures were obtained without intravenous contrast. Angiographic images of the head were obtained using MRA technique without contrast. COMPARISON:  CT HEAD July 07, 2016 FINDINGS: MRI HEAD FINDINGS INTRACRANIAL CONTENTS: No reduced diffusion to suggest acute ischemia. No susceptibility artifact to suggest hemorrhage. The ventricles and sulci are normal for patient's age. Greater than expected number of sub cm FLAIR T2 hyperintensities predominately and subcortical distribution. No masses or mass effect.  No abnormal extra-axial fluid collections. No extra-axial masses though, contrast enhanced sequences would be more sensitive. Normal major intracranial vascular flow voids present at skull base. ORBITS: The included ocular globes and orbital contents are non-suspicious. SINUSES: The mastoid air-cells and included paranasal sinuses are well-aerated. SKULL/SOFT TISSUES: No abnormal sellar expansion. No suspicious calvarial bone marrow signal. Craniocervical junction maintained. Multiple small lymph nodes in the neck. Prominent palatine tonsils. MRA HEAD FINDINGS ANTERIOR CIRCULATION: Normal flow related enhancement of the included cervical, petrous, cavernous and supraclinoid internal carotid arteries. Patent anterior communicating artery. Normal flow related enhancement of the anterior and middle cerebral arteries, including distal segments. No large vessel occlusion, high-grade stenosis, abnormal luminal irregularity, aneurysm. POSTERIOR CIRCULATION: LEFT vertebral artery is dominant. Basilar artery is patent, with normal flow related enhancement of the main branch vessels. Normal flow related enhancement of the posterior cerebral arteries. Small bilateral posterior communicating arteries present. No large vessel occlusion, high-grade stenosis, abnormal luminal irregularity, aneurysm. IMPRESSION: MRI HEAD: No acute intracranial process. Mild nonspecific white matter changes, atypical distribution for demyelination; can be associated with vasculopathy or migraine disorders. MRA HEAD: Negative. Electronically Signed   By: Elon Alas M.D.   On: 07/08/2016 02:38  Dg Swallowing Func-speech Pathology  Result Date: 07/08/2016 Objective Swallowing Evaluation: Type of Study: MBS-Modified Barium Swallow Study Patient Details Name: Dotti Busey MRN: [id number] Date of Birth: August 16, 1991 Today's Date: 07/08/2016 Time: SLP Start Time (ACUTE ONLY): 1150-SLP Stop Time (ACUTE ONLY): 1203 SLP Time Calculation (min) (ACUTE  ONLY): 13 min Past Medical History: Past Medical History: Diagnosis Date . Asthma  . Coronary artery disease   clotting disorder . Depression  . PTSD (post-traumatic stress disorder)  . Thrombosis of right atrium (HCC)  . Tobacco abuse  Past Surgical History: Past Surgical History: Procedure Laterality Date . CARDIAC SURGERY   . CHOLECYSTECTOMY   HPI: 25 y.o. female with h/o coagulopathy, R atrial thrombus with surgical removal, PTSD, depression, CAD admitted with chest pain and R sided numbness/weakness for 2-3 weeks. CT and MRI of head negative for acute infarct.  Subjective: "I can't swallow" Assessment / Plan / Recommendation CHL IP CLINICAL IMPRESSIONS 07/08/2016 Therapy Diagnosis WFL Clinical Impression Pt's oropharyngeal swallow is WFL for all consistencies tested. She does have some piecemeal swallowing, which is likely due to pt sensation of difficulty swallowing. There were times during this study when pt felt like she could not swallow or clear POs from her throat, but there was nothing observed by SLP to account for these subjective complaints. Question if pt could be experiencing globus sensation - MD may wish to consider esophageal assessment should symptoms persist. Recommend to initiate regular textures and thin liquids. Reviewed esophageal precautions with patient. No further SLP f/u indicated. Impact on safety and function No limitations   CHL IP TREATMENT RECOMMENDATION 07/08/2016 Treatment Recommendations No treatment recommended at this time   Prognosis 07/08/2016 Prognosis for Safe Diet Advancement Good Barriers to Reach Goals -- Barriers/Prognosis Comment -- CHL IP DIET RECOMMENDATION 07/08/2016 SLP Diet  Recommendations Regular solids;Thin liquid Liquid Administration via Cup;Straw Medication Administration Whole meds with liquid Compensations Slow rate;Small sips/bites;Follow solids with liquid Postural Changes Remain semi-upright after after feeds/meals (Comment);Seated upright at 90 degrees    CHL IP OTHER RECOMMENDATIONS 07/08/2016 Recommended Consults -- Oral Care Recommendations Oral care BID Other Recommendations --   CHL IP FOLLOW UP RECOMMENDATIONS 07/08/2016 Follow up Recommendations None   No flowsheet data found.     CHL IP ORAL PHASE 07/08/2016 Oral Phase WFL Oral - Pudding Teaspoon -- Oral - Pudding Cup -- Oral - Honey Teaspoon -- Oral - Honey Cup -- Oral - Nectar Teaspoon -- Oral - Nectar Cup -- Oral - Nectar Straw -- Oral - Thin Teaspoon -- Oral - Thin Cup -- Oral - Thin Straw -- Oral - Puree -- Oral - Mech Soft -- Oral - Regular -- Oral - Multi-Consistency -- Oral - Pill -- Oral Phase - Comment --  CHL IP PHARYNGEAL PHASE 07/08/2016 Pharyngeal Phase WFL Pharyngeal- Pudding Teaspoon -- Pharyngeal -- Pharyngeal- Pudding Cup -- Pharyngeal -- Pharyngeal- Honey Teaspoon -- Pharyngeal -- Pharyngeal- Honey Cup -- Pharyngeal -- Pharyngeal- Nectar Teaspoon -- Pharyngeal -- Pharyngeal- Nectar Cup -- Pharyngeal -- Pharyngeal- Nectar Straw -- Pharyngeal -- Pharyngeal- Thin Teaspoon -- Pharyngeal -- Pharyngeal- Thin Cup -- Pharyngeal -- Pharyngeal- Thin Straw -- Pharyngeal -- Pharyngeal- Puree -- Pharyngeal -- Pharyngeal- Mechanical Soft -- Pharyngeal -- Pharyngeal- Regular -- Pharyngeal -- Pharyngeal- Multi-consistency -- Pharyngeal -- Pharyngeal- Pill -- Pharyngeal -- Pharyngeal Comment --  CHL IP CERVICAL ESOPHAGEAL PHASE 07/08/2016 Cervical Esophageal Phase WFL Pudding Teaspoon -- Pudding Cup -- Honey Teaspoon -- Honey Cup -- Nectar Teaspoon -- Nectar Cup -- Nectar Straw -- Thin Teaspoon -- Thin Cup -- Thin Straw -- Puree -- Mechanical Soft -- Regular -- Multi-consistency -- Pill -- Cervical Esophageal Comment -- CHL IP GO 07/08/2016 Functional Assessment Tool Used skilled clinical judgment Functional Limitations Swallowing Swallow Current Status (Q0347) CI Swallow Goal Status (Q2595) CI Swallow Discharge Status (G3875) CI Motor Speech Current Status (I4332) (None) Motor Speech Goal Status (R5188) (None)  Motor Speech Goal Status (C1660) (None) Spoken Language Comprehension Current Status (Y3016) (None) Spoken Language Comprehension Goal Status (W1093) (None) Spoken Language Comprehension Discharge Status (A3557) (None) Spoken Language Expression Current Status (D2202) (None) Spoken Language Expression Goal Status (R4270) (None) Spoken Language Expression Discharge Status (W2376) (None) Attention Current Status (E8315) (None) Attention Goal Status (V7616) (None) Attention Discharge Status (W7371) (None) Memory Current Status (G6269) (None) Memory Goal Status (S8546) (None) Memory Discharge Status (E7035) (None) Voice Current Status (K0938) (None) Voice Goal Status (H8299) (None) Voice Discharge Status (B7169) (None) Other Speech-Language Pathology Functional Limitation (C7893) (None) Other Speech-Language Pathology Functional Limitation Goal Status (Y1017) (None) Other Speech-Language Pathology Functional Limitation Discharge Status [phone]) (None) Germain Osgood 07/08/2016, 12:12 PM  Germain Osgood, M.A. CCC-SLP [phone]             Mr Jodene Nam Headm  Result Date: 07/08/2016 CLINICAL DATA:  Chest pain, recurrent episodes of tingling and numbness, extremity weakness for 2-3 weeks. On anti coagulation for coagulopathy and RIGHT atrial thrombus. Assess TIA. EXAM: MRI HEAD WITHOUT CONTRAST MRA HEAD WITHOUT CONTRAST TECHNIQUE: Multiplanar, multiecho pulse sequences of the brain and surrounding structures were obtained without intravenous contrast. Angiographic images of the head were obtained using MRA technique without contrast. COMPARISON:  CT HEAD July 07, 2016 FINDINGS: MRI HEAD FINDINGS INTRACRANIAL CONTENTS: No reduced diffusion to suggest acute ischemia. No susceptibility artifact to suggest hemorrhage. The ventricles and sulci are normal for  patient's age. Greater than expected number of sub cm FLAIR T2 hyperintensities predominately and subcortical distribution. No masses or mass effect. No abnormal  extra-axial fluid collections. No extra-axial masses though, contrast enhanced sequences would be more sensitive. Normal major intracranial vascular flow voids present at skull base. ORBITS: The included ocular globes and orbital contents are non-suspicious. SINUSES: The mastoid air-cells and included paranasal sinuses are well-aerated. SKULL/SOFT TISSUES: No abnormal sellar expansion. No suspicious calvarial bone marrow signal. Craniocervical junction maintained. Multiple small lymph nodes in the neck. Prominent palatine tonsils. MRA HEAD FINDINGS ANTERIOR CIRCULATION: Normal flow related enhancement of the included cervical, petrous, cavernous and supraclinoid internal carotid arteries. Patent anterior communicating artery. Normal flow related enhancement of the anterior and middle cerebral arteries, including distal segments. No large vessel occlusion, high-grade stenosis, abnormal luminal irregularity, aneurysm. POSTERIOR CIRCULATION: LEFT vertebral artery is dominant. Basilar artery is patent, with normal flow related enhancement of the main branch vessels. Normal flow related enhancement of the posterior cerebral arteries. Small bilateral posterior communicating arteries present. No large vessel occlusion, high-grade stenosis, abnormal luminal irregularity, aneurysm. IMPRESSION: MRI HEAD: No acute intracranial process. Mild nonspecific white matter changes, atypical distribution for demyelination; can be associated with vasculopathy or migraine disorders. MRA HEAD: Negative. Electronically Signed   By: Elon Alas M.D.   On: 07/08/2016 02:38  Ct Angio Chest/abd/pel For Dissection W And/or W/wo  Result Date: 07/07/2016 CLINICAL DATA:  Chest pain, intermittent right arm and leg numbness. History of clotting disorder with prior right atrial clot surgical removal. EXAM: CT ANGIO CHEST-ABD-PELV FOR DISSECTION W/ AND WO/W CM<Procedure Description>CT ANGIO CHEST-ABD-PELV FOR DISSECTION W/ AND WO/W CM  TECHNIQUE: CT of the chest abdomen and pelvis without and with contrast, following angiogram of the thorax protocol. CONTRAST:  100 cc Isovue 370. COMPARISON:  CT of the chest 06/01/2006. FINDINGS: CHEST: The lungs are clear. There is no focal mass. There is no pleural effusion or pneumothorax. The heart size is normal. There is no pericardial effusion. Patient is status post median sternotomy. Surgical clips are seen within the anterior mediastinum. There is a residual soft tissue within the anterior mediastinum measuring 4.0 x 1.7 cm. No evidence of pulmonary embolus. Unremarkable appearance of the thoracic aorta and great vessels without dissection, aneurysm or periaortic fluid. The right atrium has heterogeneous appearance, which may be attributed to mixing of opacified and non-opacified blood from the superior and inferior vena cava respectively. There are no pathologically enlarged mediastinal, hilar, or axillary lymph nodes. There is no lytic or blastic osseous lesion. ABDOMEN/PELVIS: The liver demonstrates no focal abnormality. There is no intrahepatic or extrahepatic biliary ductal dilatation. The gallbladder is surgically absent. The spleen demonstrates no focal abnormality. The kidneys, adrenal glands and pancreas are normal. The bladder is unremarkable. The stomach, duodenum, small intestine, and large intestine demonstrates no contrast extravasation or dilatation. There is no pneumoperitoneum, pneumatosis, or portal venous gas. There is no abdominal or pelvic free fluid. There is no lymphadenopathy. The abdominal aorta is normal in caliber . Celiac trunk, superior mesenteric artery, bilateral renal arteries, inferior mesenteric artery, bilateral common iliac arteries and their attributes are normal. There is no lytic or blastic osseous lesion. IMPRESSION: CT THORAX IMPRESSION: No evidence of aortic dissection, aneurysm or periaortic fluid collection. No evidence of pulmonary embolus. Heterogeneous  appearance of the right atrium. It is difficult to discern whether this represent mixing of opacified and non-opacified blood from the superior and inferior vena cava respectively, or right atrial clot. If right atrial  clot is clinically suspected, cardiac echo should be considered. Postoperative changes in the anterior mediastinum with residual stable soft tissue thickening. No acute findings within the thorax. CT ABDOMEN/PELVIS IMPRESSION: No evidence of abdominal aortic dissection, aneurysm or periaortic fluid collection. Normal appearance of the named arteries within the abdomen and pelvis. Normal appearance of the solid abdominal and pelvic organs. Electronically Signed   By: Fidela Salisbury M.D.   On: 07/07/2016 19:14   Review Of Systems Constitutional: No fever No change in weight. Respiratory: Positive shortness of breath. No hemoptysis. Cardiovascular: H/O right atrial thrombus, Chest pain and shortness of breath. No palpitation. Gastrointestinal: No pain, vomiting, diarrhea or constipation.  Renal: No hematuria or kidney stone. Neurology: Right sided weakness and numbness.  Musculoskeletal: No fibromyalgia or back pain. Hematology : Positive coagulopathy.  Blood pressure (!) 124/59, pulse 83, temperature 98.2 F (36.8 C), temperature source Oral, resp. rate 16, height 5' 3" (1.6 m), weight 103.2 kg (227 lb 9.6 oz), SpO2 98 %. Physical Exam: General: Well built and nourished.  Head: Normocephalic and atraumatic. Eyes: Normal conjunctivae and Sclerae. EOMI. ENT: Normal right and left external ears and nose.  Heart: Normal rate and regular rhythm. No murmur, gallop or rub. Chest : Tenderness on palpation over right mid axillary area and 5th and 6th rib area. Lungs-clear bilaterally. Abdomen: Soft, non-tender. Neurology: Cranial nerves grossly intact. AxOx3 Moves all 4 extremities. Skin: warm and dry.  Assessment/Plan Chest pain, probably musculoskeletal Right leg pain, DVT  ruled out. Shortness of breath PE ruled out MI ruled out H/O right atrial thrombus Depression Post Traumatic Stress Disorder Tobacco use disorder  Plan: TEE in AM to exclude atrial thrombus.  Birdie Riddle, MD  07/08/2016, 9:26 PM

## 2016-07-09 NOTE — Progress Notes (Signed)
ANTICOAGULATION CONSULT NOTE - Follow Up Consult  Pharmacy Consult for heparin Indication: r/o apical thrombus vs TIA  Allergies  Allergen Reactions  . Fluoxetine Other (See Comments)    Pt told not to take with eliquis  . Ceftriaxone Itching and Other (See Comments)    Red man syndrome  . Gabapentin Other (See Comments)    Confusion/ chest pains  . Morphine And Related Itching and Nausea And Vomiting  . Nsaids Other (See Comments)    Pt told by hematologist not to take  . Topamax [Topiramate] Other (See Comments)    Dizziness, black out spells  . Toradol [Ketorolac Tromethamine] Itching and Other (See Comments)    Felt hot  . Vicodin [Hydrocodone-Acetaminophen] Other (See Comments)    hallucinations  . Depakote [Divalproex Sodium] Rash and Other (See Comments)    Skin felt like it was on fire  . Methocarbamol Rash  . Metoclopramide Rash and Hives    Labs:  Recent Labs  07/07/16 1318 07/07/16 2202 07/08/16 0338  07/08/16 0602 07/08/16 1603 07/08/16 2239 07/09/16 0346  HGB 15.5*  --   --   --  14.9  --   --  13.5  HCT 45.9  --   --   --  46.3*  --   --  43.6  PLT 378  --   --   --  351  --   --  338  APTT  --  30  --   --   --   --   --   --   LABPROT  --  13.2  --   --   --   --   --   --   INR  --  1.00  --   --   --   --   --   --   HEPARINUNFRC  --   --   --   < > 0.22* 0.27* 0.35 0.48  CREATININE 0.72  --   --   --   --   --   --  0.83  TROPONINI  --  <0.03 <0.03  --  <0.03  --   --   --   < > = values in this interval not displayed.  Estimated Creatinine Clearance: 119 mL/min (by C-G formula based on SCr of 0.83 mg/dL).  Assessment: 25 yo female r/o apical thrombus vs TIA is currently therapeutic on heparin.  Heparin level is 0.48  Goal of Therapy:  Heparin level 0.3-0.5 units/ml Monitor platelets by anticoagulation protocol: Yes   Plan:  - Continue heparin drip at 1350 units/hr - Daily heparin level (therapeutic x2)   Ruben Im, PharmD Pager:  [phone] 07/09/2016,10:39 AM

## 2016-07-09 NOTE — Progress Notes (Signed)
ANTICOAGULATION CONSULT NOTE - Follow Up Consult  Pharmacy Consult for heparin Indication: r/o apical thrombus vs TIA  Labs:  Recent Labs  07/07/16 1318 07/07/16 2202 07/08/16 0338 07/08/16 0602 07/08/16 1603 07/08/16 2239  HGB 15.5*  --   --  14.9  --   --   HCT 45.9  --   --  46.3*  --   --   PLT 378  --   --  351  --   --   APTT  --  30  --   --   --   --   LABPROT  --  13.2  --   --   --   --   INR  --  1.00  --   --   --   --   HEPARINUNFRC  --   --   --  0.22* 0.27* 0.35  CREATININE 0.72  --   --   --   --   --   TROPONINI  --  <0.03 <0.03 <0.03  --   --     Assessment/Plan:  25yo female therapeutic on heparin after dose changes. Will continue gtt at current rate and confirm stable with am labs.   Vernard Gambles, PharmD, BCPS  07/09/2016,12:02 AM

## 2016-07-09 NOTE — Progress Notes (Signed)
Patient ID: [name], female   DOB: 02-17-1991, 25 y.o.   MRN: [id number]  PROGRESS NOTE    [name]  WUJ:[id number] DOB: [birth year] DOA: 07/07/2016  PCP: No PCP Per Patient   Brief Narrative:  25 y.o. female with history of right atrial clot, L Leg DVT and PTSD who presented to [facility]  with chest pain and intermittent right side weakness and numbness. She did not receive IV t-PA due to rapidly resolving deficits.   Assessment & Plan:   Hx of right atrium thrombosis s/p thrombectomy 2014 at [facility] and not compliant with anticoagulation - MRI  No acute stroke. MRA  Unremarkable  - Carotid Doppler - unremarkable - 2D Echo  EF 55-60% - LE doppler no DVT - EEG unremarkable awake, drowsy, and sleep EEG - TEE pending, plan for this today  - On heparin drip  - Per PT eval - CIR; CIR recommends [facility] - Appreciate neurology following and their recommendations   History of clotting disorder - In 2012 left LE DVT - In 2014 right atrium thrombosis - TEE scheduled for today - She was on coumadin -> eliquis, off AC for one year due to financial difficulty - Now on heparin drip   Hyperlipidemia - LDL 113, goal < 100 - Continue lipitor on discharge  Tobacco abuse - Counseled on cessation  - Nicotine patch ordered    Morbid Obesity due to excess calories - BMI 40.96 - Counseled on diet    DVT prophylaxis: Heparin drip Code Status: full code  Family Communication: no family at the bedside  Disposition Plan: home once cleared by neuro    Consultants:   Cardiology  Neurology   Inpatient rehab   Procedures:   Carotid doppler - no significant extracranial carotid artery stenosis  Right LE doppler - no evidence of DVT  Antimicrobials:   None    Subjective: No overnight events.   Objective: Vitals:   07/08/16 2022 07/09/16 0011 07/09/16 0401 07/09/16 0920  BP: (!) 124/59 (!) 100/55 108/68 (!) 106/45  Pulse: 83 90 83 92  Resp:  16    Temp: 98.2 F (36.8  C) 99.2 F (37.3 C) 98.7 F (37.1 C) 98.8 F (37.1 C)  TempSrc: Oral Oral Oral Oral  SpO2: 98% 99% 100% 100%  Weight:   104.9 kg (231 lb 3.2 oz)   Height:        Intake/Output Summary (Last 24 hours) at 07/09/16 1233 Last data filed at 07/09/16 0600  Gross per 24 hour  Intake           854.95 ml  Output             1500 ml  Net          -645.05 ml   Filed Weights   07/07/16 2118 07/08/16 0357 07/09/16 0401  Weight: 104 kg (229 lb 3.2 oz) 103.2 kg (227 lb 9.6 oz) 104.9 kg (231 lb 3.2 oz)    Examination:  General exam: Appears calm and comfortable  Respiratory system: Clear to auscultation. Respiratory effort normal. Cardiovascular system: S1 & S2 heard, RRR. No JVD, murmurs, rubs, gallops or clicks. No pedal edema. Gastrointestinal system: Abdomen is nondistended, soft and nontender.  Central nervous system: Alert and oriented. No focal neurological deficits. Extremities: Symmetric 5 x 5 power. Skin: No rashes, lesions or ulcers Psychiatry: Judgement and insight appear normal. Mood & affect appropriate.   Data Reviewed: I have personally reviewed following labs and imaging studies  CBC:  Recent Labs Lab 07/07/16 1318 07/08/16 0602 07/09/16 0346  WBC 7.7 7.5 7.1  HGB 15.5* 14.9 13.5  HCT 45.9 46.3* 43.6  MCV 85.6 89.2 89.2  PLT 378 351 338   Basic Metabolic Panel:  Recent Labs Lab 07/07/16 1318 07/09/16 0346  NA 135 136  K 3.9 4.0  CL 104 101  CO2 21* 32  GLUCOSE 100* 85  BUN 6 <5*  CREATININE 0.72 0.83  CALCIUM 8.9 8.7*   GFR: Estimated Creatinine Clearance: 119 mL/min (by C-G formula based on SCr of 0.83 mg/dL). Liver Function Tests: No results for input(s): AST, ALT, ALKPHOS, BILITOT, PROT, ALBUMIN in the last 168 hours.  Recent Labs Lab 07/08/16 0338  LIPASE 18   No results for input(s): AMMONIA in the last 168 hours. Coagulation Profile:  Recent Labs Lab 07/07/16 2202  INR 1.00   Cardiac Enzymes:  Recent Labs Lab 07/07/16 2202  07/08/16 0338 07/08/16 0602  TROPONINI <0.03 <0.03 <0.03   BNP (last 3 results) No results for input(s): PROBNP in the last 8760 hours. HbA1C:  Recent Labs  07/08/16 0338  HGBA1C 5.5   CBG:  Recent Labs Lab 07/08/16 1015 07/09/16 0738  GLUCAP 85 97   Lipid Profile:  Recent Labs  07/08/16 0338  CHOL 183  HDL 33*  LDLCALC 113*  TRIG 186*  CHOLHDL 5.5   Thyroid Function Tests: No results for input(s): TSH, T4TOTAL, FREET4, T3FREE, THYROIDAB in the last 72 hours. Anemia Panel: No results for input(s): VITAMINB12, FOLATE, FERRITIN, TIBC, IRON, RETICCTPCT in the last 72 hours. Urine analysis:    Component Value Date/Time   COLORURINE YELLOW 07/08/2016 0410   APPEARANCEUR CLOUDY (A) 07/08/2016 0410   LABSPEC >1.046 (H) 07/08/2016 0410   PHURINE 5.5 07/08/2016 0410   GLUCOSEU NEGATIVE 07/08/2016 0410   HGBUR NEGATIVE 07/08/2016 0410   BILIRUBINUR NEGATIVE 07/08/2016 0410   KETONESUR NEGATIVE 07/08/2016 0410   PROTEINUR NEGATIVE 07/08/2016 0410   UROBILINOGEN 0.2 02/10/2011 1530   NITRITE NEGATIVE 07/08/2016 0410   LEUKOCYTESUR SMALL (A) 07/08/2016 0410   Sepsis Labs: @LABRCNTIP (procalcitonin:4,lacticidven:4)   Recent Results (from the past 240 hour(s))  Urine culture     Status: Abnormal   Collection Time: 07/08/16  4:10 AM  Result Value Ref Range Status   Specimen Description URINE, RANDOM  Final   Special Requests NONE  Final   Culture MULTIPLE SPECIES PRESENT, SUGGEST RECOLLECTION (A)  Final   Report Status 07/09/2016 FINAL  Final      Radiology Studies: Dg Chest 2 View Result Date: 07/07/2016 CLINICAL DATA: No active cardiopulmonary disease.   Ct Head Wo Contrast Result Date: 07/07/2016 CLINICAL DATA:   No acute intracranial abnormality.   Mr Brain Wo Contrast Result Date: 07/08/2016 CLINICAL DATA:  MRI HEAD: No acute intracranial process. Mild nonspecific white matter changes, atypical distribution for demyelination; can be associated with  vasculopathy or migraine disorders. MRA HEAD: Negative.  Dg Swallowing Func-speech Pathology 07/08/2016  Mr Maxine Glenn Headm Result Date: 07/08/2016 CLINICAL DATA:   MRI HEAD: No acute intracranial process. Mild nonspecific white matter changes, atypical distribution for demyelination; can be associated with vasculopathy or migraine disorders. MRA HEAD: Negative.   Ct Angio Chest/abd/pel For Dissection W And/or W/wo Result Date: 07/07/2016 CLINICAL DATA:  No evidence of abdominal aortic dissection, aneurysm or periaortic fluid collection. Normal appearance of the named arteries within the abdomen and pelvis. Normal appearance of the solid abdominal and pelvic organs.     Scheduled Meds: . atorvastatin  10 mg Oral  q1800  . nicotine  21 mg Transdermal Daily  . traZODone  100 mg Oral QHS  . Vilazodone HCl  20 mg Oral Daily   Continuous Infusions: . heparin 1,350 Units/hr (07/08/16 1750)     LOS: 1 day    Time spent: 25 minutes  Greater than 50% of the time spent on counseling and coordinating the care.   Manson Passey, MD [facility] Pager [phone]  If 7PM-7AM, please contact night-coverage [email or web address] Password [facility] 07/09/2016, 12:33 PM

## 2016-07-09 NOTE — Procedures (Signed)
Electroencephalogram (EEG) Report  Date of study: 07/08/16  Requesting clinician: Marvel Plan, MD  Reason for study: Evaluate for seizures  Brief clinical history: 24-yo woman admitted for evaluation of recurrent episodes of right-sided tingling, numbness, and weakness.   Medications:  Current Facility-Administered Medications:  .  acetaminophen (TYLENOL) tablet 650 mg, 650 mg, Oral, Q4H PRN, Lorretta Harp, MD .  ALPRAZolam Prudy Feeler) tablet 0.25 mg, 0.25 mg, Oral, BID PRN, Lorretta Harp, MD, 0.25 mg at 07/08/16 0034 .  atorvastatin (LIPITOR) tablet 10 mg, 10 mg, Oral, q1800, Marvel Plan, MD .  heparin ADULT infusion 100 units/mL (25000 units/231mL sodium chloride 0.45%), 1,350 Units/hr, Intravenous, Continuous, Dorothea Ogle, MD, Last Rate: 13.5 mL/hr at 07/08/16 1750, 1,350 Units/hr at 07/08/16 1750 .  HYDROmorphone (DILAUDID) injection 1 mg, 1 mg, Intravenous, Q3H PRN, Alison Murray, MD, 1 mg at 07/09/16 1029 .  nicotine (NICODERM CQ - dosed in mg/24 hours) patch 21 mg, 21 mg, Transdermal, Daily, Lorretta Harp, MD .  nitroGLYCERIN (NITROSTAT) SL tablet 0.4 mg, 0.4 mg, Sublingual, Q5 min PRN, Lorretta Harp, MD .  ondansetron [facility]) injection 4 mg, 4 mg, Intravenous, Q8H PRN, Lorretta Harp, MD, 4 mg at 07/08/16 1541 .  oxyCODONE-acetaminophen (PERCOCET/ROXICET) 5-325 MG per tablet 2 tablet, 2 tablet, Oral, Q4H PRN, Lorretta Harp, MD, 2 tablet at 07/09/16 0403 .  promethazine (PHENERGAN) injection 12.5 mg, 12.5 mg, Intravenous, Q6H PRN, Dorothea Ogle, MD, 12.5 mg at 07/09/16 1028 .  traZODone (DESYREL) tablet 100 mg, 100 mg, Oral, QHS, Lorretta Harp, MD, 100 mg at 07/09/16 0023 .  Vilazodone HCl TABS 20 mg, 20 mg, Oral, Daily, Lorretta Harp, MD, 20 mg at 07/09/16 1029  Description: This is a routine EEG performed using standard international 10-20 electrode placement. A total of 18 channels are recorded, including one for the EKG.  Activating Maneuvers: None  Findings:  The EKG channel demonstrates are regular rhythm  with a rate of 90 beats per minute.   The background consists predominately of theta activity with some intermixed delta. She is awake only briefly near the end of the study. The best dominant posterior rhythm is 9 Hz. This is symmetric and reacts as expected with eye opening.   There are no focal asymmetries. No epileptiform discharges are present. No seizures are recorded.   Drowsiness is recorded and is normal in appearance. Early stages of sleep are recorded and demonstrate vertex sharps without clear sleep spindles.    Impression:  This is an unremarkable awake, drowsy, and sleep EEG.    Rhona Leavens, MD [facility]

## 2016-07-09 NOTE — Progress Notes (Signed)
Occupational Therapy Cancellation Note   07/09/16 1400  OT Visit Information  Last OT Received On 07/09/16  Reason Eval/Treat Not Completed Patient at procedure or test/ unavailable (Endo)   Nils Pyle, OTR/L Pager: [phone] 07/09/2016

## 2016-07-09 NOTE — Progress Notes (Addendum)
Physical Therapy Treatment Patient Details Name: [name] MRN: [id number] DOB: 09-Dec-1991 Today's Date: 07/09/2016    History of Present Illness 25 y.o. female with h/o coagulopathy, R atrial thrombus with surgical removal, PTSD, depression, CAD admitted with chest pain and R sided numbness/weakness for 2-3 weeks. CT and MRI of head negative for acute infarct.     PT Comments    Pt was mysterious in terms of source of pain with walk but did wish to push on and completed longer trip today, eased and more comfortable at rest.  Her pain went from RLE to lungs during walk but also had mild SOB with no increase excessively in HR today.  Will continue to follow acutely to determine what her safe limits are and will be having procedure today to help with determining her reasons for chest pain and mult limb pains.  Follow Up Recommendations  Home health PT;Supervision for mobility/OOB     Equipment Recommendations  Rolling walker with 5" wheels    Recommendations for Other Services Rehab consult     Precautions / Restrictions Precautions Precautions: Fall Precaution Comments: monitor vitals, pt denies h/o falls in past 1 year Restrictions Weight Bearing Restrictions: No    Mobility  Bed Mobility Overal bed mobility: Modified Independent                Transfers Overall transfer level: Modified independent Equipment used: Rolling walker (2 wheeled) Transfers: Sit to/from [employer] Sit to Stand: Supervision         General transfer comment: Pt is stable in standing but wanted w alker due to pain complaints  Ambulation/Gait Ambulation/Gait assistance: Supervision;Min guard Ambulation Distance (Feet): 180 Feet Assistive device: Rolling walker (2 wheeled) Gait Pattern/deviations: Step-through pattern;Wide base of support;Decreased stride length Gait velocity: reduced with 2 stops Gait velocity interpretation: Below normal speed for  age/gender General Gait Details: monitored her distance but pt insisted on progressing, pain in chest was lungs per pt but no reflection in vitals and is on telemetry   Stairs Stairs:  (deferred)          Wheelchair Mobility    Modified Rankin (Stroke Patients Only)       Balance Overall balance assessment: Needs assistance Sitting-balance support: No upper extremity supported Sitting balance-Leahy Scale: Normal     Standing balance support: Bilateral upper extremity supported Standing balance-Leahy Scale: Good Standing balance comment: worked on standing clothing adjustments                    Cognition Arousal/Alertness: Awake/alert Behavior During Therapy: WFL for tasks assessed/performed Overall Cognitive Status: Within Functional Limits for tasks assessed                      Exercises General Exercises - Lower Extremity Ankle Circles/Pumps: AROM;Both;10 reps Heel Slides: AROM;Both;10 reps    General Comments General comments (skin integrity, edema, etc.): HR was 107 max with effort, down to 85 in sitting      Pertinent Vitals/Pain Pain Assessment: 0-10 Pain Score: 8  Pain Location: R leg, then lungs after walking Pain Descriptors / Indicators: Burning Pain Intervention(s): Limited activity within patient's tolerance;Monitored during session;Premedicated before session;Repositioned    Home Living                      Prior Function            PT Goals (current goals can now be found in the care  plan section) Acute Rehab PT Goals Patient Stated Goal: to have less pain Progress towards PT goals: Progressing toward goals    Frequency  Min 3X/week    PT Plan Current plan remains appropriate    Co-evaluation             End of Session   Activity Tolerance: Patient limited by pain Patient left: in bed;with call bell/phone within reach;with bed alarm set     Time: 1610-9604 PT Time Calculation (min) (ACUTE ONLY):  27 min  Charges:  $Gait Training: 8-22 mins $Therapeutic Exercise: 8-22 mins                    G Codes:      [name] 07/18/16, 11:01 AM    Samul Dada, PT MS Acute Rehab Dept. Number: [facility] [id number] and [facility] [phone]

## 2016-07-09 NOTE — CV Procedure (Signed)
INDICATIONS:   The patient is 25 year old female with known history of RA thrombus has chest pain and discontinuation of anticoagulant for over 1 year.  PROCEDURE:  Informed consent was discussed including risks, benefits and alternatives for the procedure.  Risks include, but are not limited to, cough, sore throat, vomiting, nausea, somnolence, esophageal and stomach trauma or perforation, bleeding, low blood pressure, aspiration, pneumonia, infection, trauma to the teeth and death.    Patient was given sedation total of 2 mg. Versed and 100 mcg. of fentanyl. The oropharynx was anesthetized with topical lidocaine.  The transesophageal probe was inserted in the esophagus and stomach and multiple views were obtained.  Transeophageal probe was removed from the body. The patient was kept under observation until the patient left the procedure room. The patient left the procedure room in stable condition.   COMPLICATIONS:  There were no immediate complications.  FINDINGS:  1. LEFT VENTRICLE: The left ventricle is normal in structure and function.  Wall motion is normal.  No thrombus or masses seen in the left ventricle.  2. RIGHT VENTRICLE:  The right ventricle is normal in structure and function without any thrombus or masses.    3. LEFT ATRIUM:  The left atrium is normal without any thrombus or masses.  4. LEFT ATRIAL APPENDAGE:  Not seen.  5. RIGHT ATRIUM:  The right atrium has thrombus attached to free wall.    6. ATRIAL SEPTUM:  The atrial septum is normal without any ASD or PFO by color doppler.  7. MITRAL VALVE:  The mitral valve is normal in structure and function with minimum regurgitation, no masses, stenosis or vegetations.  8. TRICUSPID VALVE:  The tricuspid valve is normal in structure and function with mild to moderate regurgitation, no masses, stenosis or vegetations.  9. AORTIC VALVE:  The aortic valve is normal in structure and function without regurgitation, masses, stenosis or  vegetations.   10. PULMONIC VALVE:  The pulmonic valve is normal in structure and function without regurgitation, masses, stenosis or vegetations.  11. AORTIC ARCH, ASCENDING AND DESCENDING AORTA:  The aorta had no atherosclerosis in the ascending. The aortic arch was not seen.Marland Kitchen  IMPRESSION:   1  [place] thrombus in RA 2. Mild to moderate TR. 3. Normal LV and RV systolic function.Marland Kitchen  RECOMMENDATIONS:    Resume Eliquis.Marland Kitchen

## 2016-07-10 ENCOUNTER — Encounter (HOSPITAL_COMMUNITY): Payer: Self-pay | Admitting: Cardiovascular Disease

## 2016-07-10 LAB — CBC
HCT: 41.4 % (ref 36.0–46.0)
HEMOGLOBIN: 13.2 g/dL (ref 12.0–15.0)
MCH: 28.1 pg (ref 26.0–34.0)
MCHC: 31.9 g/dL (ref 30.0–36.0)
MCV: 88.1 fL (ref 78.0–100.0)
Platelets: 310 10*3/uL (ref 150–400)
RBC: 4.7 MIL/uL (ref 3.87–5.11)
RDW: 13.1 % (ref 11.5–15.5)
WBC: 7.1 10*3/uL (ref 4.0–10.5)

## 2016-07-10 LAB — GLUCOSE, CAPILLARY: GLUCOSE-CAPILLARY: 106 mg/dL — AB (ref 65–99)

## 2016-07-10 LAB — HEPARIN LEVEL (UNFRACTIONATED): HEPARIN UNFRACTIONATED: 0.32 [IU]/mL (ref 0.30–0.70)

## 2016-07-10 MED ORDER — APIXABAN 5 MG PO TABS
5.0000 mg | ORAL_TABLET | Freq: Two times a day (BID) | ORAL | Status: DC
  Administered 2016-07-10: 5 mg via ORAL
  Filled 2016-07-10: qty 1

## 2016-07-10 MED ORDER — ATORVASTATIN CALCIUM 10 MG PO TABS: 10.0000 mg | ORAL_TABLET | Freq: Every day | ORAL | 0 refills | Status: DC

## 2016-07-10 MED ORDER — APIXABAN 5 MG PO TABS: 5.0000 mg | ORAL_TABLET | Freq: Two times a day (BID) | ORAL | 0 refills | Status: DC

## 2016-07-10 NOTE — Progress Notes (Signed)
Viewed pt ambulating in room unassisted without difficulty. Pt was not using walker or dragging leg. She was assessed safely ambulating in room. Pt discharged via wc without difficulty. Discharge instructions given with understanding. Emelda Brothers RN

## 2016-07-10 NOTE — Discharge Summary (Addendum)
Physician Discharge Summary  [name] ZOX:[id number] DOB: [birth year] DOA: 07/07/2016  PCP: No PCP Per Patient  Admit date: 07/07/2016 Discharge date: 07/10/2016  Recommendations for Outpatient Follow-up:  1. Continue apixaban 5 mg twice daily on discharge. I spoke with patient about community health and wellness clinic since she says the only M.D. she follows with is her psychiatrist. Information provided about community health and wellness clinic. Patient encouraged to stay compliant with anticoagulation. Patient informed about our pain medication prescribing policy.  Discharge Diagnoses:  Principal Problem:   Chest pain Active Problems:   Depression   PTSD (post-traumatic stress disorder)   Numbness on right side   Thrombosis of right atrium (HCC)   Tobacco abuse   Nausea with vomiting    Discharge Condition: stable   Diet recommendation: as tolerated   History of present illness:  25 y.o.femalewith history of right atrial clot, L Leg DVT and PTSD who presented to [facility]  with chest pain and intermittent right side weakness and numbness. Shedidnot receive IV t-PA due to rapidly resolving deficits.   Hospital Course:   Assessment & Plan:   Hx of right atrium thrombosis s/p thrombectomy 2014 at [facility] and not compliant with anticoagulation - MRINo acute stroke. MRAUnremarkable  - CarotidDoppler -unremarkable - 2D EchoEF 55-60% - LE doppler no DVT - EEG unremarkable awake, drowsy, and sleep EEG - TEE with small mobile thrombus in right atrium - On heparin drip; switch to eliquis on discharge - Per PT eval - CIR; CIR recommends HH; orders in place - Appreciate neurology following and their recommendations; no further diagnostic workup required. Neurology signed off  History of clotting disorder / Right atrial thrombus  - In 2012 left LE DVT - In 2014 right atrium thrombosis - TEE 8/1 showed small mobile thrombus and right atrium, mild to moderate tricuspid  regurgitation and normal left ventricular function - She was on coumadin -> continue apixaban on discharge, as also noted by cardiology risk of noncompliance is still there. I spoke with patient about community health and wellness clinic since the only M.D. she follows is her psychiatrist - Per cardiology, stop heparin drip  Hyperlipidemia - LDL 113, goal < 100 - Continue lipitor on discharge  Tobacco abuse - Counseled on cessation  - Nicotine patch ordered   Morbid Obesity due to excess calories - BMI 40.96 - Counseled on diet    DVT prophylaxis: Heparin drip --> apixaban on discharge  Code Status: full code  Family Communication: no family at the bedside     Consultants:   Cardiology  Neurology   Inpatient rehab   Procedures:   Carotid doppler - no significant extracranial carotid artery stenosis  Right LE doppler - no evidence of DVT  TEE 07/09/2016 - small mobile thrombus in right atrium, mild to moderate tricuspid regurgitation normal left ventricular systolic function  Antimicrobials:   None    Signed:  Manson Passey, MD  [facility] 07/10/2016, 10:26 AM  Pager #: [phone]  Time spent in minutes: less than 30 minutes    Discharge Exam: Vitals:   07/10/16 0400 07/10/16 0830  BP: 115/66 110/65  Pulse: 92 94  Resp: 16 18  Temp: 98.7 F (37.1 C) 97.6 F (36.4 C)   Vitals:   07/09/16 2044 07/10/16 0030 07/10/16 0400 07/10/16 0830  BP: (!) 106/53 (!) 112/59 115/66 110/65  Pulse: 87 95 92 94  Resp: 16 16 16 18   Temp: 98 F (36.7 C) 98.2 F (36.8 C) 98.7  F (37.1 C) 97.6 F (36.4 C)  TempSrc: Oral Oral Oral Oral  SpO2: 99% 100% 100% 97%  Weight:   106.8 kg (235 lb 6.4 oz)   Height:        General: Pt is alert, follows commands appropriately, not in acute distress Cardiovascular: Regular rate and rhythm, S1/S2 +, no murmurs Respiratory: Clear to auscultation bilaterally, no wheezing, no crackles, no  rhonchi Abdominal: Soft, non tender, non distended, bowel sounds +, no guarding Extremities: no edema, no cyanosis, pulses palpable bilaterally DP and PT Neuro: Grossly nonfocal  Discharge Instructions  Discharge Instructions    Ambulatory referral to Neurology    Complete by:  As directed   Pt will follow up with Dr. Roda Shutters at [facility] in about 2 months. Thanks.   Call MD for:  difficulty breathing, headache or visual disturbances    Complete by:  As directed   Call MD for:  persistant nausea and vomiting    Complete by:  As directed   Diet - low sodium heart healthy    Complete by:  As directed   Discharge instructions    Complete by:  As directed   Continue apixaban on discharge 5 mg twice a day.   Increase activity slowly    Complete by:  As directed       Medication List    STOP taking these medications   oxyCODONE-acetaminophen 5-325 MG tablet Commonly known as:  PERCOCET/ROXICET   traZODone 100 MG tablet Commonly known as:  DESYREL     TAKE these medications   acetaminophen 500 MG tablet Commonly known as:  TYLENOL Take 1,000-1,500 mg by mouth every 4 (four) hours as needed (pain).   apixaban 5 MG Tabs tablet Commonly known as:  ELIQUIS Take 1 tablet (5 mg total) by mouth 2 (two) times daily.   atorvastatin 10 MG tablet Commonly known as:  LIPITOR Take 1 tablet (10 mg total) by mouth daily at 6 PM.   VIIBRYD 20 MG Tabs Generic drug:  Vilazodone HCl Take 20 mg by mouth daily.      Follow-up Information    Xu,Jindong, MD. Schedule an appointment as soon as possible for a visit in 2 month(s).   Specialty:  Neurology Contact information: [address] [place] [address] [phone]        Ricki Rodriguez, MD Follow up in 2 week(s).   Specialty:  Cardiology Contact information: [address] [place] [address] [phone]        [facility]. Schedule an appointment as soon as possible for a visit  in 1 week(s).   Contact information: [address] [place] [address] [phone]           The results of significant diagnostics from this hospitalization (including imaging, microbiology, ancillary and laboratory) are listed below for reference.    Significant Diagnostic Studies: Dg Chest 2 View  Result Date: 07/07/2016 CLINICAL DATA:  Chest pain, cough beginning last night. EXAM: CHEST  2 VIEW COMPARISON:  06/01/2016 FINDINGS: Prior median sternotomy. Heart and mediastinal contours are within normal limits. No focal opacities or effusions. No acute bony abnormality. IMPRESSION: No active cardiopulmonary disease. Electronically Signed   By: Charlett Nose M.D.   On: 07/07/2016 14:12  Ct Head Wo Contrast  Result Date: 07/07/2016 CLINICAL DATA:  Intermittent right arm and leg numbness. EXAM: CT HEAD WITHOUT CONTRAST TECHNIQUE: Contiguous axial images were obtained from the base of the skull  through the vertex without intravenous contrast. COMPARISON:  None. FINDINGS: Brain: No evidence of acute infarction, hemorrhage, extra-axial collection, ventriculomegaly, or mass effect. Vascular: No hyperdense vessel or unexpected calcification. Skull: Negative for fracture or focal lesion. Sinuses/Orbits: No acute findings. Other: None. IMPRESSION: No acute intracranial abnormality. Electronically Signed   By: Ted Mcalpine M.D.   On: 07/07/2016 18:42  Mr Brain Wo Contrast  Result Date: 07/08/2016 CLINICAL DATA:  Chest pain, recurrent episodes of tingling and numbness, extremity weakness for 2-3 weeks. On anti coagulation for coagulopathy and RIGHT atrial thrombus. Assess TIA. EXAM: MRI HEAD WITHOUT CONTRAST MRA HEAD WITHOUT CONTRAST TECHNIQUE: Multiplanar, multiecho pulse sequences of the brain and surrounding structures were obtained without intravenous contrast. Angiographic images of the head were obtained using MRA technique without contrast. COMPARISON:  CT HEAD July 07, 2016 FINDINGS: MRI HEAD FINDINGS INTRACRANIAL CONTENTS: No reduced diffusion to suggest acute ischemia. No susceptibility artifact to suggest hemorrhage. The ventricles and sulci are normal for patient's age. Greater than expected number of sub cm FLAIR T2 hyperintensities predominately and subcortical distribution. No masses or mass effect. No abnormal extra-axial fluid collections. No extra-axial masses though, contrast enhanced sequences would be more sensitive. Normal major intracranial vascular flow voids present at skull base. ORBITS: The included ocular globes and orbital contents are non-suspicious. SINUSES: The mastoid air-cells and included paranasal sinuses are well-aerated. SKULL/SOFT TISSUES: No abnormal sellar expansion. No suspicious calvarial bone marrow signal. Craniocervical junction maintained. Multiple small lymph nodes in the neck. Prominent palatine tonsils. MRA HEAD FINDINGS ANTERIOR CIRCULATION: Normal flow related enhancement of the included cervical, petrous, cavernous and supraclinoid internal carotid arteries. Patent anterior communicating artery. Normal flow related enhancement of the anterior and middle cerebral arteries, including distal segments. No large vessel occlusion, high-grade stenosis, abnormal luminal irregularity, aneurysm. POSTERIOR CIRCULATION: LEFT vertebral artery is dominant. Basilar artery is patent, with normal flow related enhancement of the main branch vessels. Normal flow related enhancement of the posterior cerebral arteries. Small bilateral posterior communicating arteries present. No large vessel occlusion, high-grade stenosis, abnormal luminal irregularity, aneurysm. IMPRESSION: MRI HEAD: No acute intracranial process. Mild nonspecific white matter changes, atypical distribution for demyelination; can be associated with vasculopathy or migraine disorders. MRA HEAD: Negative. Electronically Signed   By: Awilda Metro M.D.   On: 07/08/2016 02:38  Dg  Swallowing Func-speech Pathology  Result Date: 07/08/2016 Objective Swallowing Evaluation: Type of Study: MBS-Modified Barium Swallow Study Patient Details Name: Sitlaly Gudiel MRN: [id number] Date of Birth: 06/07/1991 Today's Date: 07/08/2016 Time: SLP Start Time (ACUTE ONLY): 1150-SLP Stop Time (ACUTE ONLY): 1203 SLP Time Calculation (min) (ACUTE ONLY): 13 min Past Medical History: Past Medical History: Diagnosis Date . Asthma  . Coronary artery disease   clotting disorder . Depression  . PTSD (post-traumatic stress disorder)  . Thrombosis of right atrium (HCC)  . Tobacco abuse  Past Surgical History: Past Surgical History: Procedure Laterality Date . CARDIAC SURGERY   . CHOLECYSTECTOMY   HPI: 25 y.o. female with h/o coagulopathy, R atrial thrombus with surgical removal, PTSD, depression, CAD admitted with chest pain and R sided numbness/weakness for 2-3 weeks. CT and MRI of head negative for acute infarct.  Subjective: "I can't swallow" Assessment / Plan / Recommendation CHL IP CLINICAL IMPRESSIONS 07/08/2016 Therapy Diagnosis WFL Clinical Impression Pt's oropharyngeal swallow is WFL for all consistencies tested. She does have some piecemeal swallowing, which is likely due to pt sensation of difficulty swallowing. There were times during this study when pt felt like she could  not swallow or clear POs from her throat, but there was nothing observed by SLP to account for these subjective complaints. Question if pt could be experiencing globus sensation - MD may wish to consider esophageal assessment should symptoms persist. Recommend to initiate regular textures and thin liquids. Reviewed esophageal precautions with patient. No further SLP f/u indicated. Impact on safety and function No limitations   CHL IP TREATMENT RECOMMENDATION 07/08/2016 Treatment Recommendations No treatment recommended at this time   Prognosis 07/08/2016 Prognosis for Safe Diet Advancement Good Barriers to Reach Goals -- Barriers/Prognosis  Comment -- CHL IP DIET RECOMMENDATION 07/08/2016 SLP Diet Recommendations Regular solids;Thin liquid Liquid Administration via Cup;Straw Medication Administration Whole meds with liquid Compensations Slow rate;Small sips/bites;Follow solids with liquid Postural Changes Remain semi-upright after after feeds/meals (Comment);Seated upright at 90 degrees   CHL IP OTHER RECOMMENDATIONS 07/08/2016 Recommended Consults -- Oral Care Recommendations Oral care BID Other Recommendations --   CHL IP FOLLOW UP RECOMMENDATIONS 07/08/2016 Follow up Recommendations None   No flowsheet data found.     CHL IP ORAL PHASE 07/08/2016 Oral Phase WFL Oral - Pudding Teaspoon -- Oral - Pudding Cup -- Oral - Honey Teaspoon -- Oral - Honey Cup -- Oral - Nectar Teaspoon -- Oral - Nectar Cup -- Oral - Nectar Straw -- Oral - Thin Teaspoon -- Oral - Thin Cup -- Oral - Thin Straw -- Oral - Puree -- Oral - Mech Soft -- Oral - Regular -- Oral - Multi-Consistency -- Oral - Pill -- Oral Phase - Comment --  CHL IP PHARYNGEAL PHASE 07/08/2016 Pharyngeal Phase WFL Pharyngeal- Pudding Teaspoon -- Pharyngeal -- Pharyngeal- Pudding Cup -- Pharyngeal -- Pharyngeal- Honey Teaspoon -- Pharyngeal -- Pharyngeal- Honey Cup -- Pharyngeal -- Pharyngeal- Nectar Teaspoon -- Pharyngeal -- Pharyngeal- Nectar Cup -- Pharyngeal -- Pharyngeal- Nectar Straw -- Pharyngeal -- Pharyngeal- Thin Teaspoon -- Pharyngeal -- Pharyngeal- Thin Cup -- Pharyngeal -- Pharyngeal- Thin Straw -- Pharyngeal -- Pharyngeal- Puree -- Pharyngeal -- Pharyngeal- Mechanical Soft -- Pharyngeal -- Pharyngeal- Regular -- Pharyngeal -- Pharyngeal- Multi-consistency -- Pharyngeal -- Pharyngeal- Pill -- Pharyngeal -- Pharyngeal Comment --  CHL IP CERVICAL ESOPHAGEAL PHASE 07/08/2016 Cervical Esophageal Phase WFL Pudding Teaspoon -- Pudding Cup -- Honey Teaspoon -- Honey Cup -- Nectar Teaspoon -- Nectar Cup -- Nectar Straw -- Thin Teaspoon -- Thin Cup -- Thin Straw -- Puree -- Mechanical Soft -- Regular --  Multi-consistency -- Pill -- Cervical Esophageal Comment -- CHL IP GO 07/08/2016 Functional Assessment Tool Used skilled clinical judgment Functional Limitations Swallowing Swallow Current Status (J6967) CI Swallow Goal Status (E9381) CI Swallow Discharge Status (O1751) CI Motor Speech Current Status (W2585) (None) Motor Speech Goal Status (I7782) (None) Motor Speech Goal Status (U2353) (None) Spoken Language Comprehension Current Status (I1443) (None) Spoken Language Comprehension Goal Status (X5400) (None) Spoken Language Comprehension Discharge Status (Q6761) (None) Spoken Language Expression Current Status (P5093) (None) Spoken Language Expression Goal Status (O6712) (None) Spoken Language Expression Discharge Status (W5809) (None) Attention Current Status (X8338) (None) Attention Goal Status (S5053) (None) Attention Discharge Status (Z7673) (None) Memory Current Status (A1937) (None) Memory Goal Status (T0240) (None) Memory Discharge Status (X7353) (None) Voice Current Status (G9924) (None) Voice Goal Status (Q6834) (None) Voice Discharge Status (H9622) (None) Other Speech-Language Pathology Functional Limitation (W9798) (None) Other Speech-Language Pathology Functional Limitation Goal Status (X2119) (None) Other Speech-Language Pathology Functional Limitation Discharge Status [phone]) (None) [name] 07/08/2016, 12:12 PM  [name], M.A. CCC-SLP [phone]  Mr Maxine Glenn Headm  Result Date: 07/08/2016 CLINICAL DATA:  Chest pain, recurrent episodes of tingling and numbness, extremity weakness for 2-3 weeks. On anti coagulation for coagulopathy and RIGHT atrial thrombus. Assess TIA. EXAM: MRI HEAD WITHOUT CONTRAST MRA HEAD WITHOUT CONTRAST TECHNIQUE: Multiplanar, multiecho pulse sequences of the brain and surrounding structures were obtained without intravenous contrast. Angiographic images of the head were obtained using MRA technique without contrast. COMPARISON:  CT HEAD July 07, 2016  FINDINGS: MRI HEAD FINDINGS INTRACRANIAL CONTENTS: No reduced diffusion to suggest acute ischemia. No susceptibility artifact to suggest hemorrhage. The ventricles and sulci are normal for patient's age. Greater than expected number of sub cm FLAIR T2 hyperintensities predominately and subcortical distribution. No masses or mass effect. No abnormal extra-axial fluid collections. No extra-axial masses though, contrast enhanced sequences would be more sensitive. Normal major intracranial vascular flow voids present at skull base. ORBITS: The included ocular globes and orbital contents are non-suspicious. SINUSES: The mastoid air-cells and included paranasal sinuses are well-aerated. SKULL/SOFT TISSUES: No abnormal sellar expansion. No suspicious calvarial bone marrow signal. Craniocervical junction maintained. Multiple small lymph nodes in the neck. Prominent palatine tonsils. MRA HEAD FINDINGS ANTERIOR CIRCULATION: Normal flow related enhancement of the included cervical, petrous, cavernous and supraclinoid internal carotid arteries. Patent anterior communicating artery. Normal flow related enhancement of the anterior and middle cerebral arteries, including distal segments. No large vessel occlusion, high-grade stenosis, abnormal luminal irregularity, aneurysm. POSTERIOR CIRCULATION: LEFT vertebral artery is dominant. Basilar artery is patent, with normal flow related enhancement of the main branch vessels. Normal flow related enhancement of the posterior cerebral arteries. Small bilateral posterior communicating arteries present. No large vessel occlusion, high-grade stenosis, abnormal luminal irregularity, aneurysm. IMPRESSION: MRI HEAD: No acute intracranial process. Mild nonspecific white matter changes, atypical distribution for demyelination; can be associated with vasculopathy or migraine disorders. MRA HEAD: Negative. Electronically Signed   By: Awilda Metro M.D.   On: 07/08/2016 02:38  Ct Angio  Chest/abd/pel For Dissection W And/or W/wo  Result Date: 07/07/2016 CLINICAL DATA:  Chest pain, intermittent right arm and leg numbness. History of clotting disorder with prior right atrial clot surgical removal. EXAM: CT ANGIO CHEST-ABD-PELV FOR DISSECTION W/ AND WO/W CM<Procedure Description>CT ANGIO CHEST-ABD-PELV FOR DISSECTION W/ AND WO/W CM TECHNIQUE: CT of the chest abdomen and pelvis without and with contrast, following angiogram of the thorax protocol. CONTRAST:  100 cc Isovue 370. COMPARISON:  CT of the chest 06/01/2006. FINDINGS: CHEST: The lungs are clear. There is no focal mass. There is no pleural effusion or pneumothorax. The heart size is normal. There is no pericardial effusion. Patient is status post median sternotomy. Surgical clips are seen within the anterior mediastinum. There is a residual soft tissue within the anterior mediastinum measuring 4.0 x 1.7 cm. No evidence of pulmonary embolus. Unremarkable appearance of the thoracic aorta and great vessels without dissection, aneurysm or periaortic fluid. The right atrium has heterogeneous appearance, which may be attributed to mixing of opacified and non-opacified blood from the superior and inferior vena cava respectively. There are no pathologically enlarged mediastinal, hilar, or axillary lymph nodes. There is no lytic or blastic osseous lesion. ABDOMEN/PELVIS: The liver demonstrates no focal abnormality. There is no intrahepatic or extrahepatic biliary ductal dilatation. The gallbladder is surgically absent. The spleen demonstrates no focal abnormality. The kidneys, adrenal glands and pancreas are normal. The bladder is unremarkable. The stomach, duodenum, small intestine, and large intestine demonstrates no contrast extravasation or dilatation. There is no pneumoperitoneum, pneumatosis, or portal venous  gas. There is no abdominal or pelvic free fluid. There is no lymphadenopathy. The abdominal aorta is normal in caliber . Celiac trunk,  superior mesenteric artery, bilateral renal arteries, inferior mesenteric artery, bilateral common iliac arteries and their attributes are normal. There is no lytic or blastic osseous lesion. IMPRESSION: CT THORAX IMPRESSION: No evidence of aortic dissection, aneurysm or periaortic fluid collection. No evidence of pulmonary embolus. Heterogeneous appearance of the right atrium. It is difficult to discern whether this represent mixing of opacified and non-opacified blood from the superior and inferior vena cava respectively, or right atrial clot. If right atrial clot is clinically suspected, cardiac echo should be considered. Postoperative changes in the anterior mediastinum with residual stable soft tissue thickening. No acute findings within the thorax. CT ABDOMEN/PELVIS IMPRESSION: No evidence of abdominal aortic dissection, aneurysm or periaortic fluid collection. Normal appearance of the named arteries within the abdomen and pelvis. Normal appearance of the solid abdominal and pelvic organs. Electronically Signed   By: Ted Mcalpine M.D.   On: 07/07/2016 19:14   Microbiology: Recent Results (from the past 240 hour(s))  Urine culture     Status: Abnormal   Collection Time: 07/08/16  4:10 AM  Result Value Ref Range Status   Specimen Description URINE, RANDOM  Final   Special Requests NONE  Final   Culture MULTIPLE SPECIES PRESENT, SUGGEST RECOLLECTION (A)  Final   Report Status 07/09/2016 FINAL  Final     Labs: Basic Metabolic Panel:  Recent Labs Lab 07/07/16 1318 07/09/16 0346  NA 135 136  K 3.9 4.0  CL 104 101  CO2 21* 32  GLUCOSE 100* 85  BUN 6 <5*  CREATININE 0.72 0.83  CALCIUM 8.9 8.7*   Liver Function Tests: No results for input(s): AST, ALT, ALKPHOS, BILITOT, PROT, ALBUMIN in the last 168 hours.  Recent Labs Lab 07/08/16 0338  LIPASE 18   No results for input(s): AMMONIA in the last 168 hours. CBC:  Recent Labs Lab 07/07/16 1318 07/08/16 0602 07/09/16 0346  07/10/16 0339  WBC 7.7 7.5 7.1 7.1  HGB 15.5* 14.9 13.5 13.2  HCT 45.9 46.3* 43.6 41.4  MCV 85.6 89.2 89.2 88.1  PLT 378 351 338 310   Cardiac Enzymes:  Recent Labs Lab 07/07/16 2202 07/08/16 0338 07/08/16 0602  TROPONINI <0.03 <0.03 <0.03   BNP: BNP (last 3 results) No results for input(s): BNP in the last 8760 hours.  ProBNP (last 3 results) No results for input(s): PROBNP in the last 8760 hours.  CBG:  Recent Labs Lab 07/08/16 1015 07/09/16 0738 07/10/16 0719  GLUCAP 85 97 106*

## 2016-07-10 NOTE — Progress Notes (Signed)
ANTICOAGULATION CONSULT NOTE - Follow Up Consult  Pharmacy Consult for heparin Indication: r/o apical thrombus vs TIA  Allergies  Allergen Reactions  . Fluoxetine Other (See Comments)    Pt told not to take with eliquis  . Ceftriaxone Itching and Other (See Comments)    Red man syndrome  . Gabapentin Other (See Comments)    Confusion/ chest pains  . Morphine And Related Itching and Nausea And Vomiting  . Nsaids Other (See Comments)    Pt told by hematologist not to take  . Topamax [Topiramate] Other (See Comments)    Dizziness, black out spells  . Toradol [Ketorolac Tromethamine] Itching and Other (See Comments)    Felt hot  . Vicodin [Hydrocodone-Acetaminophen] Other (See Comments)    hallucinations  . Depakote [Divalproex Sodium] Rash and Other (See Comments)    Skin felt like it was on fire  . Methocarbamol Rash  . Metoclopramide Rash and Hives    Labs:  Recent Labs  07/07/16 1318 07/07/16 2202 07/08/16 0338 07/08/16 0602  07/08/16 2239 07/09/16 0346 07/10/16 0339  HGB 15.5*  --   --  14.9  --   --  13.5 13.2  HCT 45.9  --   --  46.3*  --   --  43.6 41.4  PLT 378  --   --  351  --   --  338 310  APTT  --  30  --   --   --   --   --   --   LABPROT  --  13.2  --   --   --   --   --   --   INR  --  1.00  --   --   --   --   --   --   HEPARINUNFRC  --   --   --  0.22*  < > 0.35 0.48 0.32  CREATININE 0.72  --   --   --   --   --  0.83  --   TROPONINI  --  <0.03 <0.03 <0.03  --   --   --   --   < > = values in this interval not displayed.  Estimated Creatinine Clearance: 120.3 mL/min (by C-G formula based on SCr of 0.83 mg/dL).  Assessment: 24 YOF continues on heparin for apical thrombus. HL today remains therapeutic at 0.32. CBC WNL and stable. No overt bleeding reported.   Goal of Therapy:  Heparin level 0.3-0.5 units/ml - per neuro Monitor platelets by anticoagulation protocol: Yes   Plan:  -Continue heparin at 1350 units/hr -Daily HL/CBC -F/U long-term  A/C plans - previously on Eliquis but unable to afford    Sherle Poe, PharmD Clinical Pharmacist  7:17 AM, 07/10/2016

## 2016-07-10 NOTE — Discharge Instructions (Addendum)
Apixaban oral tablets °What is this medicine? °APIXABAN (a PIX a ban) is an anticoagulant (blood thinner). It is used to lower the chance of stroke in people with a medical condition called atrial fibrillation. It is also used to treat or prevent blood clots in the lungs or in the veins. °This medicine may be used for other purposes; ask your health care provider or pharmacist if you have questions. °What should I tell my health care provider before I take this medicine? °They need to know if you have any of these conditions: °-bleeding disorders °-bleeding in the brain °-blood in your stools (black or tarry stools) or if you have blood in your vomit °-history of stomach bleeding °-kidney disease °-liver disease °-mechanical heart valve °-an unusual or allergic reaction to apixaban, other medicines, foods, dyes, or preservatives °-pregnant or trying to get pregnant °-breast-feeding °How should I use this medicine? °Take this medicine by mouth with a glass of water. Follow the directions on the prescription label. You can take it with or without food. If it upsets your stomach, take it with food. Take your medicine at regular intervals. Do not take it more often than directed. Do not stop taking except on your doctor's advice. Stopping this medicine may increase your risk of a blot clot. Be sure to refill your prescription before you run out of medicine. °Talk to your pediatrician regarding the use of this medicine in children. Special care may be needed. °Overdosage: If you think you have taken too much of this medicine contact a poison control center or emergency room at once. °NOTE: This medicine is only for you. Do not share this medicine with others. °What if I miss a dose? °If you miss a dose, take it as soon as you can. If it is almost time for your next dose, take only that dose. Do not take double or extra doses. °What may interact with this medicine? °This medicine may interact with the following: °-aspirin  and aspirin-like medicines °-certain medicines for fungal infections like ketoconazole and itraconazole °-certain medicines for seizures like carbamazepine and phenytoin °-certain medicines that treat or prevent blood clots like warfarin, enoxaparin, and dalteparin °-clarithromycin °-NSAIDs, medicines for pain and inflammation, like ibuprofen or naproxen °-rifampin °-ritonavir °-St. John's wort °This list may not describe all possible interactions. Give your health care provider a list of all the medicines, herbs, non-prescription drugs, or dietary supplements you use. Also tell them if you smoke, drink alcohol, or use illegal drugs. Some items may interact with your medicine. °What should I watch for while using this medicine? °Notify your doctor or health care professional and seek emergency treatment if you develop breathing problems; changes in vision; chest pain; severe, sudden headache; pain, swelling, warmth in the leg; trouble speaking; sudden numbness or weakness of the face, arm, or leg. These can be signs that your condition has gotten worse. °If you are going to have surgery, tell your doctor or health care professional that you are taking this medicine. °Tell your health care professional that you use this medicine before you have a spinal or epidural procedure. Sometimes people who take this medicine have bleeding problems around the spine when they have a spinal or epidural procedure. This bleeding is very rare. If you have a spinal or epidural procedure while on this medicine, call your health care professional immediately if you have back pain, numbness or tingling (especially in your legs and feet), muscle weakness, paralysis, or loss of bladder or bowel   control. °Avoid sports and activities that might cause injury while you are using this medicine. Severe falls or injuries can cause unseen bleeding. Be careful when using sharp tools or knives. Consider using an electric razor. Take special care  brushing or flossing your teeth. Report any injuries, bruising, or red spots on the skin to your doctor or health care professional. °What side effects may I notice from receiving this medicine? °Side effects that you should report to your doctor or health care professional as soon as possible: °-allergic reactions like skin rash, itching or hives, swelling of the face, lips, or tongue °-signs and symptoms of bleeding such as bloody or black, tarry stools; red or dark-brown urine; spitting up blood or brown material that looks like coffee grounds; red spots on the skin; unusual bruising or bleeding from the eye, gums, or nose °This list may not describe all possible side effects. Call your doctor for medical advice about side effects. You may report side effects to FDA at 1-800-FDA-1088. °Where should I keep my medicine? °Keep out of the reach of children. °Store at room temperature between 20 and 25 degrees C (68 and 77 degrees F). Throw away any unused medicine after the expiration date. °NOTE: This sheet is a summary. It may not cover all possible information. If you have questions about this medicine, talk to your doctor, pharmacist, or health care provider. °  °© 2016, [employer]. (2013-07-30 11:59:24) ° °

## 2016-07-10 NOTE — Care Management Note (Addendum)
Case Management Note  Patient Details  Name: [name] MRN: [id number] Date of Birth: 07-Jun-1991  Subjective/Objective: Pt presented for chest pain. Plan for d/c on Eliquis. Pt has insurance via [employer]- pt does work. No PCP at this time. CM did make pt aware that she can call her insurance for PCP.                    Action/Plan: CM did provide pt with co pay card for Eliquis. Benefits check is in process and will make pt aware of cost once completed. Pt uses [facility] in [place]- and medication is available. No further needs from CM at this time.   Expected Discharge Date:                  Expected Discharge Plan:  Home/Self Care  In-House Referral:  NA  Discharge planning Services  CM Consult, Medication Assistance  Post Acute Care Choice:  NA Choice offered to:  NA  DME Arranged:  N/A DME Agency:  NA  HH Arranged:  NA HH Agency:     Status of Service:  Completed, signed off  If discussed at Long Length of Stay Meetings, dates discussed:    Additional Comments: 1111 07-10-16 Tomi Bamberger, RN,BSN [phone] CM did speak with pt in regards to disposition needs. Recommendations for HHPT- CM did make her aware that she will have a co pay once that was mentioned pt states she is not willing to pay for  services. Pt stated she can borrow a RW and will not need one before d/c. No Services or DME set up before d/c. CM did recommend once she gets a PCP to f/u with them for LE weakness.   CO pay received post d/c S/W [place] @ [employer] # [phone]   ELIQUIS 5MG  BID ( 30 )   COVER- YES  CO-PAY $ 250.00  TIER- 4 DRUG  PRIOR APPROVAL - NO  PHARMACY: [facility] , [facility]   [name], RN 07/10/2016, 10:42 AM

## 2016-07-10 NOTE — Progress Notes (Signed)
Occupational Therapy Treatment Patient Details Name: [name] MRN: [id number] DOB: April 07, 1991 Today's Date: 07/10/2016    History of present illness 25 y.o. female with h/o coagulopathy, R atrial thrombus with surgical removal, PTSD, depression, CAD admitted with chest pain and R sided numbness/weakness for 2-3 weeks. CT and MRI of head negative for acute infarct.    OT comments  Pt with inconsistent movement throughout session. Pt states her RLE has become weaker and more numb since this am. Pt has difficulty maintaining control of knee and hip against gravity, however, can bridge and hold position in bed when adjusting sheets. When using RW, can mobilize with R knee minimally buckling at times, but foot clearing floor, however, when standing in RW and attempting hip flexion/ext, pt dragging foot on floor.  RUE WFL. Feel pt safe to D/C home with intermittent S @ RW level.   Follow Up Recommendations  No OT follow up;Supervision - Intermittent    Equipment Recommendations  Tub/shower seat    Recommendations for Other Services      Precautions / Restrictions Precautions Precautions: Fall       Mobility Bed Mobility    Independent              Transfers Overall transfer level: Modified independent Equipment used: Rolling walker (2 wheeled) Transfers: Sit to/from Stand Sit to Stand: Modified independent (Device/Increase time)              Balance             Standing balance-Leahy Scale: Fair                     ADL                       Lower Body Dressing: Modified independent;Sit to/from stand. Able to cross RLE over L knee for LB ADL without difficulty   Toilet Transfer: Modified Independent;RW   Toileting- Clothing Manipulation and Hygiene: Modified independent;Sit to/from stand       Functional mobility during ADLs: Supervision/safety;Rolling walker General ADL Comments: Nursing reports ptwaling to bathroom and managing ADL  independently. Pt able to compelte ADL safely. when asked who was at home to help her, she stated "nobody, that's why i need to stay another night".      Vision  no complaints of deficits                              Cognition   Behavior During Therapy: WFL for tasks assessed/performed Overall Cognitive Status: Within Functional Limits for tasks assessed                       Extremity/Trunk Assessment   increased complaints of RLE weakness                        General Comments      Pertinent Vitals/ Pain       Pain Assessment: Faces Faces Pain Scale: Hurts little more Pain Location: LLE Pain Descriptors / Indicators: Grimacing Pain Intervention(s): Limited activity within patient's tolerance  Home Living                                          Prior Functioning/Environment  Frequency Min 2X/week     Progress Toward Goals  OT Goals(current goals can now be found in the care plan section)  Progress towards OT goals: Progressing toward goals  Acute Rehab OT Goals Patient Stated Goal: to have less pain OT Goal Formulation: With patient Time For Goal Achievement: 07/22/16 Potential to Achieve Goals: Good ADL Goals Pt Will Transfer to Toilet: with modified independence;regular height toilet Pt Will Perform Toileting - Clothing Manipulation and hygiene: with modified independence;sit to/from stand Pt Will Perform Tub/Shower Transfer: Tub transfer;with modified independence;ambulating Pt/caregiver will Perform Home Exercise Program: Increased ROM;Right Upper extremity;With theraband;Independently;With written HEP provided  Plan Discharge plan remains appropriate    Co-evaluation                 End of Session Equipment Utilized During Treatment: Rolling walker   Activity Tolerance Patient tolerated treatment well   Patient Left in bed;with call bell/phone within reach;with family/visitor  present   Nurse Communication Mobility status        Time: 1051-1104 OT Time Calculation (min): 13 min  Charges: OT General Charges $OT Visit: 1 Procedure OT Treatments $Self Care/Home Management : 8-22 mins  Trygve Thal,HILLARY 07/10/2016, 11:20 AM  Luisa Dago, OTR/L  [phone] 07/10/2016

## 2016-07-10 NOTE — Consult Note (Signed)
Ref: No PCP Per Patient   Subjective:  Feeling little better. Discussed smoking cessation. Patient agrees to reduce smoking to 1-2 cigarettes till ready to quit. Aware of blood clot in right atrium. Had nose bleed and headaches from coumadin use, hence will resume Eliquis, Compliance will be an issue unless gets medical assistance.  Objective:  Vital Signs in the last 24 hours: Temp:  [97.9 F (36.6 C)-98.8 F (37.1 C)] 98.7 F (37.1 C) (08/02 0400) Pulse Rate:  [67-95] 92 (08/02 0400) Cardiac Rhythm: Normal sinus rhythm (08/01 2000) Resp:  [10-30] 16 (08/02 0400) BP: (92-138)/(44-90) 115/66 (08/02 0400) SpO2:  [95 %-100 %] 100 % (08/02 0400) Weight:  [106.8 kg (235 lb 6.4 oz)] 106.8 kg (235 lb 6.4 oz) (08/02 0400)  Physical Exam: BP Readings from Last 1 Encounters:  07/10/16 115/66    Wt Readings from Last 1 Encounters:  07/10/16 106.8 kg (235 lb 6.4 oz)    Weight change: 1.905 kg (4 lb 3.2 oz)  HEENT: [place]/AT, Eyes-EOMI, Conjunctiva-Pink, Sclera-Non-icteric Neck: No JVD, No bruit, Trachea midline. Lungs:  Clear, Bilateral. Cardiac:  Regular rhythm, normal S1 and S2, no S3.  Abdomen:  Soft, non-tender. Extremities:  No edema present. No cyanosis. No clubbing. CNS: AxOx3, Cranial nerves grossly intact, moves all 4 extremities.  Skin: Warm and dry.   Intake/Output from previous day: No intake/output data recorded.    Lab Results: BMET    Component Value Date/Time   NA 136 07/09/2016 0346   NA 135 07/07/2016 1318   K 4.0 07/09/2016 0346   K 3.9 07/07/2016 1318   CL 101 07/09/2016 0346   CL 104 07/07/2016 1318   CO2 32 07/09/2016 0346   CO2 21 (L) 07/07/2016 1318   GLUCOSE 85 07/09/2016 0346   GLUCOSE 100 (H) 07/07/2016 1318   GLUCOSE 101 (H) 02/19/2011 0032   BUN <5 (L) 07/09/2016 0346   BUN 6 07/07/2016 1318   CREATININE 0.83 07/09/2016 0346   CREATININE 0.72 07/07/2016 1318   CALCIUM 8.7 (L) 07/09/2016 0346   CALCIUM 8.9 07/07/2016 1318   GFRNONAA >60  07/09/2016 0346   GFRNONAA >60 07/07/2016 1318   GFRAA >60 07/09/2016 0346   GFRAA >60 07/07/2016 1318   CBC    Component Value Date/Time   WBC 7.1 07/10/2016 0339   RBC 4.70 07/10/2016 0339   HGB 13.2 07/10/2016 0339   HCT 41.4 07/10/2016 0339   PLT 310 07/10/2016 0339   MCV 88.1 07/10/2016 0339   MCH 28.1 07/10/2016 0339   MCHC 31.9 07/10/2016 0339   RDW 13.1 07/10/2016 0339   HEPATIC Function Panel No results for input(s): PROT in the last 8760 hours.  Invalid input(s):  ALBUMIN,  AST,  ALT,  ALKPHOS,  BILIDIR,  IBILI HEMOGLOBIN A1C No components found for: HGA1C,  MPG CARDIAC ENZYMES Lab Results  Component Value Date   TROPONINI <0.03 07/08/2016   TROPONINI <0.03 07/08/2016   TROPONINI <0.03 07/07/2016   BNP No results for input(s): PROBNP in the last 8760 hours. TSH No results for input(s): TSH in the last 8760 hours. CHOLESTEROL  Recent Labs  07/08/16 0338  CHOL 183    Scheduled Meds: . apixaban  5 mg Oral BID  . atorvastatin  10 mg Oral q1800  . nicotine  21 mg Transdermal Daily  . traZODone  100 mg Oral QHS  . Vilazodone HCl  20 mg Oral Daily   Continuous Infusions: . heparin 1,350 Units/hr (07/09/16 1539)   PRN Meds:.acetaminophen, ALPRAZolam, HYDROmorphone (  DILAUDID) injection, nitroGLYCERIN, ondansetron (ZOFRAN) IV, oxyCODONE-acetaminophen, promethazine  Assessment/Plan: Right atrial thrombi Chest pain, musculoskeletal  Shortness of breath PE and MI rules out Depression PTSD Tobacco use disorder  Start Apixaban. DC heparin in 2 hours. Increase activity. F/U in 2 weeks. SW to assist with medication.   LOS: 2 days    Orpah Cobb  MD  07/10/2016, 9:00 AM

## 2016-07-19 ENCOUNTER — Encounter (HOSPITAL_COMMUNITY): Payer: Self-pay | Admitting: *Deleted

## 2016-07-19 ENCOUNTER — Emergency Department (HOSPITAL_COMMUNITY)
Admission: EM | Admit: 2016-07-19 | Discharge: 2016-07-20 | Disposition: A | Discharge status: Home / Self Care | Payer: BLUE CROSS/BLUE SHIELD | Attending: Emergency Medicine | Admitting: Emergency Medicine

## 2016-07-19 ENCOUNTER — Emergency Department (HOSPITAL_COMMUNITY): Payer: BLUE CROSS/BLUE SHIELD

## 2016-07-19 DIAGNOSIS — Z7901 Long term (current) use of anticoagulants: Secondary | ICD-10-CM | POA: Diagnosis not present

## 2016-07-19 DIAGNOSIS — J45909 Unspecified asthma, uncomplicated: Secondary | ICD-10-CM | POA: Insufficient documentation

## 2016-07-19 DIAGNOSIS — R079 Chest pain, unspecified: Secondary | ICD-10-CM | POA: Insufficient documentation

## 2016-07-19 DIAGNOSIS — I2510 Atherosclerotic heart disease of native coronary artery without angina pectoris: Secondary | ICD-10-CM | POA: Insufficient documentation

## 2016-07-19 DIAGNOSIS — F17200 Nicotine dependence, unspecified, uncomplicated: Secondary | ICD-10-CM | POA: Insufficient documentation

## 2016-07-19 DIAGNOSIS — R200 Anesthesia of skin: Secondary | ICD-10-CM

## 2016-07-19 DIAGNOSIS — R531 Weakness: Secondary | ICD-10-CM

## 2016-07-19 LAB — CBC
HCT: 47.1 % — ABNORMAL HIGH (ref 36.0–46.0)
HEMOGLOBIN: 15.7 g/dL — AB (ref 12.0–15.0)
MCH: 28.4 pg (ref 26.0–34.0)
MCHC: 33.3 g/dL (ref 30.0–36.0)
MCV: 85.3 fL (ref 78.0–100.0)
Platelets: 445 10*3/uL — ABNORMAL HIGH (ref 150–400)
RBC: 5.52 MIL/uL — AB (ref 3.87–5.11)
RDW: 13.2 % (ref 11.5–15.5)
WBC: 7.9 10*3/uL (ref 4.0–10.5)

## 2016-07-19 LAB — I-STAT TROPONIN, ED: TROPONIN I, POC: 0 ng/mL (ref 0.00–0.08)

## 2016-07-19 LAB — BASIC METABOLIC PANEL
ANION GAP: 11 (ref 5–15)
BUN: 6 mg/dL (ref 6–20)
CALCIUM: 9.3 mg/dL (ref 8.9–10.3)
CO2: 22 mmol/L (ref 22–32)
Chloride: 105 mmol/L (ref 101–111)
Creatinine, Ser: 0.8 mg/dL (ref 0.44–1.00)
Glucose, Bld: 111 mg/dL — ABNORMAL HIGH (ref 65–99)
POTASSIUM: 3.4 mmol/L — AB (ref 3.5–5.1)
Sodium: 138 mmol/L (ref 135–145)

## 2016-07-19 NOTE — ED Notes (Addendum)
Pt  was discharged Wednesday from inpatient.  States they had found a "clot in her heart"  Pt is taking Eliquis.  Was told that if symptoms didn't resolve to return to the ED.  Also has complaints of weakness and numbness on the right side. Symptoms persisted after discharge and then last night started coughing up dark red blood and having shortness of breath.   States chest pain is localized to the right side of the chest, stabbing and sharp, and radiates under right arm.  Waxes and wanes.

## 2016-07-19 NOTE — ED Provider Notes (Signed)
[facility]-EMERGENCY DEPT Provider Note   CSN: [id number] Arrival date & time: 07/19/16  [id number]  First Provider Contact:  First MD Initiated Contact with Patient 07/19/16 2304        History   Chief Complaint Chief Complaint  Patient presents with  . Chest Pain  . Weakness  . Numbness    HPI [name] is a 25 y.o. female.  Patient presents to the emergency department with chief complaint of chest pain.  She states that she was recently admitted to the hospital for right atrial thrombus. She was recently discharged on 8/2 on Eliquis. She states that she has been compliant in taking her Eliquis since discharge. Today, she reports that she has had worsening chest pain for the past 3 days as well as shortness of breath. She states that the shortness of breath is worse at night when she lies down. Additionally, she reports hemoptysis today. She also states that she has had worsening right upper and right lower extremity weakness. He is scheduled to follow-up with neurology and cardiology on an outpatient basis, but has not done so yet. She denies any fevers or chills.   The history is provided by the patient. No language interpreter was used.    Past Medical History:  Diagnosis Date  . Asthma   . Coronary artery disease    clotting disorder  . Depression   . PTSD (post-traumatic stress disorder)   . Thrombosis of right atrium (HCC)   . Tobacco abuse     Patient Active Problem List   Diagnosis Date Noted  . Nausea with vomiting 07/08/2016  . Chest pain 07/07/2016  . Numbness on right side 07/07/2016  . Depression   . PTSD (post-traumatic stress disorder)   . Thrombosis of right atrium (HCC)   . Tobacco abuse   . Atypical chest pain     Past Surgical History:  Procedure Laterality Date  . CARDIAC SURGERY    . CHOLECYSTECTOMY    . TEE WITHOUT CARDIOVERSION N/A 07/09/2016   Procedure: TRANSESOPHAGEAL ECHOCARDIOGRAM (TEE);  Surgeon: Orpah CobbAjay Kadakia, MD;  [facility];   Service: Cardiovascular;  Laterality: N/A;    OB History    No data available       Home Medications    Prior to Admission medications   Medication Sig Start Date End Date Taking? Authorizing Provider  acetaminophen (TYLENOL) 500 MG tablet Take 1,000-1,500 mg by mouth every 4 (four) hours as needed (pain).   Yes Historical Provider, MD  apixaban (ELIQUIS) 5 MG TABS tablet Take 1 tablet (5 mg total) by mouth 2 (two) times daily. 07/10/16  Yes Alison MurrayAlma M Devine, MD  atorvastatin (LIPITOR) 10 MG tablet Take 1 tablet (10 mg total) by mouth daily at 6 PM. 07/10/16  Yes Alison MurrayAlma M Devine, MD  oxyCODONE-acetaminophen (PERCOCET) 10-325 MG tablet Take 1 tablet by mouth every 4 (four) hours as needed for pain.   Yes Historical Provider, MD    Family History Family History  Problem Relation Age of Onset  . Bipolar disorder Mother   . Glaucoma Mother   . Arthritis Father     Social History Social History  Substance Use Topics  . Smoking status: Current Every Day Smoker  . Smokeless tobacco: Never Used  . Alcohol use No     Allergies   Fluoxetine; Ceftriaxone; Gabapentin; Morphine and related; Nsaids; Topamax [topiramate]; Toradol [ketorolac tromethamine]; Vicodin [hydrocodone-acetaminophen]; Depakote [divalproex sodium]; Methocarbamol; and Metoclopramide   Review of Systems Review of Systems  Respiratory: Positive for shortness of breath.   Cardiovascular: Positive for chest pain.  Neurological: Positive for weakness.  All other systems reviewed and are negative.    Physical Exam Updated Vital Signs BP 139/83 (BP Location: Right Arm)   Pulse 102   Temp 98.6 F (37 C) (Oral)   Resp 18   Ht  (1.6 m)   Wt 102.1 kg   LMP 07/08/2016 Comment: pt states that she has had her tubes tied   SpO2 97%   BMI 39.86 kg/m   Physical Exam  Constitutional: She is oriented to person, place, and time. She appears well-developed and well-nourished.  HENT:  Head: Normocephalic and atraumatic.   Eyes: Conjunctivae and EOM are normal. Pupils are equal, round, and reactive to light.  Neck: Normal range of motion. Neck supple.  Cardiovascular: Regular rhythm.  Exam reveals no gallop and no friction rub.   No murmur heard. tachycardia  Pulmonary/Chest: Effort normal and breath sounds normal. No respiratory distress. She has no wheezes. She has no rales. She exhibits no tenderness.  CTAB  Abdominal: Soft. Bowel sounds are normal. She exhibits no distension and no mass. There is no tenderness. There is no rebound and no guarding.  Musculoskeletal: Normal range of motion. She exhibits no edema or tenderness.  Moves all extremities  Neurological: She is alert and oriented to person, place, and time.  Right lower extremity strength 4/5, left 5/5 Right lower extremity sensation 4/5, left 5/5 Bilateral UE strength and sensation 5/5  Skin: Skin is warm and dry.  Psychiatric: She has a normal mood and affect. Her behavior is normal. Judgment and thought content normal.  Nursing note and vitals reviewed.    ED Treatments / Results  Labs (all labs ordered are listed, but only abnormal results are displayed) Labs Reviewed  BASIC METABOLIC PANEL - Abnormal; Notable for the following:       Result Value   Potassium 3.4 (*)    Glucose, Bld 111 (*)    All other components within normal limits  CBC - Abnormal; Notable for the following:    RBC 5.52 (*)    Hemoglobin 15.7 (*)    HCT 47.1 (*)    Platelets 445 (*)    All other components within normal limits  I-STAT TROPOININ, ED    EKG  EKG Interpretation None       Radiology Dg Chest 2 View  Result Date: 07/19/2016 CLINICAL DATA:  Chest pain and shortness of breath for 4 days. Coughing up dark blood. History of smoking. EXAM: CHEST  2 VIEW COMPARISON:  07/07/2016 FINDINGS: Changes from a previous median sternotomy are stable. The cardiac silhouette is normal in size and configuration. Normal mediastinal and hilar contours. Clear  lungs.  No pleural effusion or pneumothorax. Skeletal structures are intact. IMPRESSION: No active cardiopulmonary disease. Electronically Signed   By: Amie Portland M.D.   On: 07/19/2016 19:31    Procedures Procedures (including critical care time)  Medications Ordered in ED Medications - No data to display   Initial Impression / Assessment and Plan / ED Course  I have reviewed the triage vital signs and the nursing notes.  Pertinent labs & imaging results that were available during my care of the patient were reviewed by me and considered in my medical decision making (see chart for details).  Clinical Course    Patient with complex past medical history including right atrial thrombus presents with chest pain, shortness breath, hemoptysis, and right lower  extremity weakness. She was recently admitted for the same. She is taking Eliquis.  She has been compliant with her medications. She states that on Wednesday of this week her chest pain began to worsen, she has had worsening shortness of breath, and today has had hemoptysis. She reports worsening right lower extremity weakness. This is appreciable on exam. During her recent admission she had a negative CT dissection study as well as negative MRI/MRA of the brain. Right atrial thrombus was seen on echocardiogram.  Patient discussed with Dr. Bebe Shaggy, who recommends treating pain and ambulating again.  If still tachy and symptomatic the CT PE study.  Patient signed out to [facility], [place].  Final Clinical Impressions(s) / ED Diagnoses   Final diagnoses:  None    New Prescriptions New Prescriptions   No medications on file     Roxy Horseman, PA-C 07/20/16 0055    Zadie Rhine, MD 07/20/16 [id number]

## 2016-07-19 NOTE — ED Notes (Signed)
Pt ambulated in hallway with pulse ox; starting HR 115 and O2 sat 100%; pt ambulated around nursing station with steady gait, reported lightheadedness but denied wheelchair; pt placed back in bed and placed on wall monitor; ending HR 132 and O2 sat 99%; pt having labored and tachypnea; will continue to monitor

## 2016-07-19 NOTE — ED Triage Notes (Signed)
Pt c/o right chest, right arm/leg numbness for over a week. Pt states she was dx with blood clot in heart, admitted for 4 days. Pt currently taking Elaquis. Pt states she started coughing up blood last night. Pt denies blood in stool or urine. Pt states she wakes up gasping for air when she sleeps.

## 2016-07-20 ENCOUNTER — Encounter (HOSPITAL_COMMUNITY): Payer: Self-pay | Admitting: Emergency Medicine

## 2016-07-20 ENCOUNTER — Emergency Department (HOSPITAL_COMMUNITY): Payer: BLUE CROSS/BLUE SHIELD

## 2016-07-20 ENCOUNTER — Emergency Department (HOSPITAL_COMMUNITY)
Admission: EM | Admit: 2016-07-20 | Discharge: 2016-07-21 | Disposition: A | Discharge status: Home / Self Care | Payer: BLUE CROSS/BLUE SHIELD | Source: Home / Self Care | Attending: Emergency Medicine | Admitting: Emergency Medicine

## 2016-07-20 DIAGNOSIS — Z7901 Long term (current) use of anticoagulants: Secondary | ICD-10-CM

## 2016-07-20 DIAGNOSIS — R0789 Other chest pain: Secondary | ICD-10-CM | POA: Insufficient documentation

## 2016-07-20 DIAGNOSIS — F17200 Nicotine dependence, unspecified, uncomplicated: Secondary | ICD-10-CM

## 2016-07-20 DIAGNOSIS — I2510 Atherosclerotic heart disease of native coronary artery without angina pectoris: Secondary | ICD-10-CM

## 2016-07-20 DIAGNOSIS — R42 Dizziness and giddiness: Secondary | ICD-10-CM | POA: Insufficient documentation

## 2016-07-20 DIAGNOSIS — J45909 Unspecified asthma, uncomplicated: Secondary | ICD-10-CM | POA: Insufficient documentation

## 2016-07-20 LAB — I-STAT BETA HCG BLOOD, ED (MC, WL, AP ONLY): I-stat hCG, quantitative: <5 m[IU]/mL (ref <5)

## 2016-07-20 LAB — BASIC METABOLIC PANEL
Anion gap: 11 (ref 5–15)
CHLORIDE: 106 mmol/L (ref 101–111)
CO2: 21 mmol/L — AB (ref 22–32)
Calcium: 9.1 mg/dL (ref 8.9–10.3)
Creatinine, Ser: 0.77 mg/dL (ref 0.44–1.00)
GFR calc Af Amer: >60 mL/min (ref >60)
GFR calc non Af Amer: >60 mL/min (ref >60)
GLUCOSE: 85 mg/dL (ref 65–99)
POTASSIUM: 3.5 mmol/L (ref 3.5–5.1)
Sodium: 138 mmol/L (ref 135–145)

## 2016-07-20 LAB — CBC
HEMATOCRIT: 42.6 % (ref 36.0–46.0)
HEMOGLOBIN: 14.5 g/dL (ref 12.0–15.0)
MCH: 28.9 pg (ref 26.0–34.0)
MCHC: 34 g/dL (ref 30.0–36.0)
MCV: 85 fL (ref 78.0–100.0)
PLATELETS: 432 10*3/uL — AB (ref 150–400)
RBC: 5.01 MIL/uL (ref 3.87–5.11)
RDW: 13.1 % (ref 11.5–15.5)
WBC: 8.6 10*3/uL (ref 4.0–10.5)

## 2016-07-20 LAB — I-STAT TROPONIN, ED: Troponin i, poc: 0 ng/mL (ref 0.00–0.08)

## 2016-07-20 LAB — TROPONIN I: Troponin I: <0.03 ng/mL (ref <0.03)

## 2016-07-20 MED ORDER — OXYCODONE-ACETAMINOPHEN 5-325 MG PO TABS
2.0000 | ORAL_TABLET | Freq: Once | ORAL | Status: AC
  Administered 2016-07-20: 2 via ORAL
  Filled 2016-07-20: qty 2

## 2016-07-20 MED ORDER — PROMETHAZINE HCL 25 MG/ML IJ SOLN
12.5000 mg | Freq: Once | INTRAMUSCULAR | Status: AC
  Administered 2016-07-20: 12.5 mg via INTRAVENOUS
  Filled 2016-07-20: qty 1

## 2016-07-20 MED ORDER — IOPAMIDOL (ISOVUE-370) INJECTION 76%
INTRAVENOUS | Status: AC
  Administered 2016-07-20: 80 mL
  Filled 2016-07-20: qty 100

## 2016-07-20 MED ORDER — ONDANSETRON 4 MG PO TBDP
4.0000 mg | ORAL_TABLET | Freq: Once | ORAL | Status: AC
  Administered 2016-07-20: 4 mg via ORAL
  Filled 2016-07-20: qty 1

## 2016-07-20 NOTE — ED Provider Notes (Signed)
[facility]-EMERGENCY DEPT Provider Note   CSN: [id number] Arrival date & time: 07/20/16  2237  First Provider Contact:   First MD Initiated Contact with Patient 07/20/16 2310     By signing my name below, I, Suzan Slick. Elon Spanner, attest that this documentation has been prepared under the direction and in the presence of Charlynne Pander, MD.  Electronically Signed: Suzan Slick. Elon Spanner, ED Scribe. 07/20/16. 11:23 PM.    History   Chief Complaint Chief Complaint  Patient presents with  . Chest Pain   The history is provided by the patient. No language interpreter was used.    HPI Comments: Conswella Bruney is a 25 y.o. female with a PMHx of CAD and thrombosis of R atrium who presents to the Emergency Department complaining of intermittent, worsening R sided numbness and weakness x 2 weeks. Pt also reports ongoing dizziness, lightheadedness, intermittent blurred vision, nausea, and chest pain. 1 episode of hematemesis last night reported. No aggravating or alleviating factors at this time. No recent fever or chills. Pt was evaluated in the Emergency Department yesterday for same. She had labs and CT angio which performed which did not reveal a PE. In addition, pt was admitted to the hospital 2 weeks ago. 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 She has been on Eliquis. No recent issues with medication. Pt is scheduled to follow with a neurologist in September.  PCP: No PCP Per Patient    Past Medical History:  Diagnosis Date  . Asthma   . Coronary artery disease    clotting disorder  . Depression   . PTSD (post-traumatic stress disorder)   . Thrombosis of right atrium (HCC)   . Tobacco abuse     Patient Active Problem List   Diagnosis Date Noted  . Nausea with vomiting 07/08/2016  . Chest pain 07/07/2016  . Numbness on right side 07/07/2016  . Depression   . PTSD (post-traumatic  stress disorder)   . Thrombosis of right atrium (HCC)   . Tobacco abuse   . Atypical chest pain     Past Surgical History:  Procedure Laterality Date  . CARDIAC SURGERY    . CHOLECYSTECTOMY    . TEE WITHOUT CARDIOVERSION N/A 07/09/2016   Procedure: TRANSESOPHAGEAL ECHOCARDIOGRAM (TEE);  Surgeon: Orpah Cobb, MD;  Location: [facility] ENDOSCOPY;  Service: Cardiovascular;  Laterality: N/A;    OB History    No data available       Home Medications    Prior to Admission medications   Medication Sig Start Date End Date Taking? Authorizing Provider  acetaminophen (TYLENOL) 500 MG tablet Take 1,000-1,500 mg by mouth every 4 (four) hours as needed (pain).   Yes Historical Provider, MD  apixaban (ELIQUIS) 5 MG TABS tablet Take 1 tablet (5 mg total) by mouth 2 (two) times daily. 07/10/16  Yes Alison Murray, MD  atorvastatin (LIPITOR) 10 MG tablet Take 1 tablet (10 mg total) by mouth daily at 6 PM. 07/10/16  Yes Alison Murray, MD  oxyCODONE-acetaminophen (PERCOCET) 10-325 MG tablet Take 1 tablet by mouth every 4 (four) hours as needed for pain.   Yes Historical Provider, MD    Family History Family History  Problem Relation Age of Onset  . Bipolar disorder Mother   . Glaucoma Mother   . Arthritis Father     Social History Social History  Substance Use Topics  .  Smoking status: Current Every Day Smoker  . Smokeless tobacco: Never Used  . Alcohol use No     Allergies   Fluoxetine; Ceftriaxone; Gabapentin; Morphine and related; Nsaids; Topamax [topiramate]; Toradol [ketorolac tromethamine]; Vicodin [hydrocodone-acetaminophen]; Depakote [divalproex sodium]; Methocarbamol; and Metoclopramide   Review of Systems Review of Systems  Constitutional: Negative for chills and fever.  Eyes: Positive for visual disturbance.  Cardiovascular: Positive for chest pain.  Gastrointestinal: Positive for nausea.  Neurological: Positive for light-headedness and numbness.  Psychiatric/Behavioral: Negative  for confusion.  All other systems reviewed and are negative.    Physical Exam Updated Vital Signs BP 108/62 (BP Location: Right Arm)   Pulse 75   Temp 98.4 F (36.9 C) (Oral)   Resp 16   LMP 07/08/2016 Comment: pt states that she has had her tubes tied   SpO2 98%   Physical Exam  Constitutional: She is oriented to person, place, and time. She appears well-developed and well-nourished. No distress.  HENT:  Head: Normocephalic and atraumatic.  Eyes: EOM are normal.  Neck: Normal range of motion.  Cardiovascular: Normal rate, regular rhythm and normal heart sounds.   Pulmonary/Chest: Effort normal and breath sounds normal.  Abdominal: Soft. She exhibits no distension. There is no tenderness.  Musculoskeletal: Normal range of motion.  Neurological: She is alert and oriented to person, place, and time. She displays normal reflexes. No cranial nerve deficit. Coordination normal.  Nl gait, nl finger to nose   Skin: Skin is warm and dry.  Psychiatric: She has a normal mood and affect. Judgment normal.  Nursing note and vitals reviewed.    ED Treatments / Results   DIAGNOSTIC STUDIES: Oxygen Saturation is 100% on RA, Normal by my interpretation.    COORDINATION OF CARE: 11:21 PM- Will order blood work and EKG. Discussed treatment plan with pt at bedside and pt agreed to plan.      Labs (all labs ordered are listed, but only abnormal results are displayed) Labs Reviewed  BASIC METABOLIC PANEL - Abnormal; Notable for the following:       Result Value   CO2 21 (*)    BUN <5 (*)    All other components within normal limits  CBC - Abnormal; Notable for the following:    Platelets 432 (*)    All other components within normal limits  TROPONIN I  I-STAT TROPOININ, ED    EKG  EKG Interpretation  Date/Time:  Saturday July 20 2016 22:45:49 EDT Ventricular Rate:  75 PR Interval:    QRS Duration: 73 QT Interval:  384 QTC Calculation: 429 R Axis:   58 Text  Interpretation:  Sinus rhythm No significant change since last tracing Confirmed by YAO  MD, DAVID ([id number]) on 07/20/2016 11:10:56 PM       Radiology Dg Chest 2 View  Result Date: 07/19/2016 CLINICAL DATA:  Chest pain and shortness of breath for 4 days. Coughing up dark blood. History of smoking. EXAM: CHEST  2 VIEW COMPARISON:  07/07/2016 FINDINGS: Changes from a previous median sternotomy are stable. The cardiac silhouette is normal in size and configuration. Normal mediastinal and hilar contours. Clear lungs.  No pleural effusion or pneumothorax. Skeletal structures are intact. IMPRESSION: No active cardiopulmonary disease. Electronically Signed   By: Amie Portlandavid  Ormond M.D.   On: 07/19/2016 19:31   Ct Angio Chest Pe W And/or Wo Contrast  Result Date: 07/20/2016 CLINICAL DATA:  Acute onset of generalized chest pain and hemoptysis. Shortness of breath and arm numbness. Initial encounter.  EXAM: CT ANGIOGRAPHY CHEST WITH CONTRAST TECHNIQUE: Multidetector CT imaging of the chest was performed using the standard protocol during bolus administration of intravenous contrast. Multiplanar CT image reconstructions and MIPs were obtained to evaluate the vascular anatomy. CONTRAST:  80 mL of Isovue 370 IV contrast COMPARISON:  Chest radiograph performed 07/19/2016 FINDINGS: There is no evidence of pulmonary embolus. Minimal bilateral atelectasis is noted. The lungs are otherwise clear. There is no evidence of significant focal consolidation, pleural effusion or pneumothorax. No masses are identified; no abnormal focal contrast enhancement is seen. The mediastinum is unremarkable in appearance. No mediastinal lymphadenopathy is seen. No pericardial effusion is identified. The great vessels are grossly unremarkable in appearance. The patient is status post median sternotomy. No axillary lymphadenopathy is seen. The visualized portions of the thyroid gland are unremarkable in appearance. The visualized portions of the  liver and spleen are unremarkable. The visualized portions of the pancreas, adrenal glands and left kidney are grossly unremarkable. No acute osseous abnormalities are seen. Review of the MIP images confirms the above findings. IMPRESSION: No evidence of pulmonary embolus. Minimal bilateral atelectasis noted; lungs otherwise clear. Electronically Signed   By: Roanna Raider M.D.   On: 07/20/2016 04:11    Procedures Procedures (including critical care time)  Medications Ordered in ED Medications - No data to display   Initial Impression / Assessment and Plan / ED Course  I have reviewed the triage vital signs and the nursing notes.  Pertinent labs & imaging results that were available during my care of the patient were reviewed by me and considered in my medical decision making (see chart for details).  Clinical Course    Kloee Ballew is a 25 y.o. female here with persistent chest pain, dizziness, numbness. She had extensive workup during the last admission and currently a normal MRI and MRA of the brain. He also had CT angio chest earlier in the day. She has neuro and cardiology workup but has persistent dizziness, chest pain. She has nl neuro exam currently. Repeat labs including trop neg. Patient seemed anxious about her symptoms. I reassured her and told her that she needs to see neurology outpatient and possibly get nerve conduction or EMG or other testing outpatient. I doubt ACS and PE was ruled out. She needs to continue eliquis and see cardiology for follow up.    Final Clinical Impressions(s) / ED Diagnoses   Final diagnoses:  None    New Prescriptions New Prescriptions   No medications on file   I personally performed the services described in this documentation, which was scribed in my presence. The recorded information has been reviewed and is accurate.    Charlynne Pander, MD 07/21/16 Jacinta Shoe

## 2016-07-20 NOTE — ED Notes (Signed)
Patient returned from CT

## 2016-07-20 NOTE — ED Provider Notes (Signed)
Pt still symptomatic with ambulating including CP and tachycardia Will obtain CT chest to evaluate for significant PE as she has missed doses of anticoagulant    Zadie Rhineonald Zuriel Yeaman, MD 07/20/16 0155

## 2016-07-20 NOTE — ED Notes (Signed)
Attempted to ambulate pt in hall with pulse ox.  Initial HR 88, O2 Sat 100%.  Upon ambulating pt became dizzy and reported blurred vision.  HR 115-120, O2 sat remained at 100%.  Pt was placed in a wheelchair and returned to her room.  Will continue to monitor.

## 2016-07-20 NOTE — ED Triage Notes (Signed)
Pt arrived to Ed via EMS. [facility] at 5[facility]30[facility]. Pt was at work sitting, and began to feel chest pain with dizziness and nausea. Chest pain reported at 9 on scale of 0-10. Chest pain has been occurring for over a week. Pt takes Eliquis for clot found in heart last week. EKG at EMS arrival showed Sinus Tach, respirations 46. Pt also complains of right arm numbness for 4 days since last hospital admission.

## 2016-07-20 NOTE — ED Notes (Signed)
Pt ambulated to restroom. 

## 2016-07-20 NOTE — Discharge Instructions (Signed)
Read the information below.   Your imaging did not show a new pulmonary embolus. However, it is very important that you take your Eliquis as prescribed and do not miss doses. Missing doses increases your risk of a clot forming in your legs, heart, lungs, or brain.  I have provided the contact information for Neurology - please call ASAP to schedule a follow up appointment regarding the numbness and weakness.  I have provided the contact information for Cardiology - please call to schedule a follow up appointment regarding your chest pain.  You may return to the Emergency Department at any time for worsening condition or any new symptoms that concern you. Return to ED if your symptoms worsen or you develop new symptoms such as fever, unilateral leg swelling or pain, facial droop, or slurred speech.

## 2016-07-20 NOTE — ED Notes (Signed)
MD at bedside. 

## 2016-07-21 NOTE — Discharge Instructions (Signed)
Continue taking eliquis and other meds.   See your neurologist and cardiologist.   Return to ER if you have worse chest pain, shortness of breath, passing out, dizziness, numbness, weakness

## 2016-08-26 ENCOUNTER — Ambulatory Visit: Payer: Self-pay | Admitting: Neurology

## 2016-08-27 ENCOUNTER — Encounter: Payer: Self-pay | Admitting: Neurology

## 2016-10-14 DIAGNOSIS — R29898 Other symptoms and signs involving the musculoskeletal system: Secondary | ICD-10-CM | POA: Insufficient documentation

## 2016-10-14 DIAGNOSIS — F444 Conversion disorder with motor symptom or deficit: Secondary | ICD-10-CM | POA: Insufficient documentation

## 2016-12-09 DIAGNOSIS — G459 Transient cerebral ischemic attack, unspecified: Secondary | ICD-10-CM

## 2016-12-09 DIAGNOSIS — I513 Intracardiac thrombosis, not elsewhere classified: Secondary | ICD-10-CM

## 2016-12-09 HISTORY — DX: Intracardiac thrombosis, not elsewhere classified: I51.3

## 2016-12-09 HISTORY — DX: Transient cerebral ischemic attack, unspecified: G45.9

## 2016-12-23 ENCOUNTER — Encounter (HOSPITAL_COMMUNITY): Payer: Self-pay

## 2016-12-23 ENCOUNTER — Emergency Department (HOSPITAL_COMMUNITY)
Admission: EM | Admit: 2016-12-23 | Discharge: 2016-12-23 | Disposition: A | Discharge status: Home / Self Care | Payer: Self-pay | Attending: Emergency Medicine | Admitting: Emergency Medicine

## 2016-12-23 ENCOUNTER — Emergency Department (HOSPITAL_COMMUNITY): Payer: Self-pay

## 2016-12-23 ENCOUNTER — Other Ambulatory Visit: Payer: Self-pay

## 2016-12-23 ENCOUNTER — Telehealth: Payer: Self-pay | Admitting: Surgery

## 2016-12-23 DIAGNOSIS — N61 Mastitis without abscess: Secondary | ICD-10-CM | POA: Insufficient documentation

## 2016-12-23 DIAGNOSIS — I2510 Atherosclerotic heart disease of native coronary artery without angina pectoris: Secondary | ICD-10-CM | POA: Insufficient documentation

## 2016-12-23 DIAGNOSIS — F17200 Nicotine dependence, unspecified, uncomplicated: Secondary | ICD-10-CM | POA: Insufficient documentation

## 2016-12-23 DIAGNOSIS — Z7901 Long term (current) use of anticoagulants: Secondary | ICD-10-CM | POA: Insufficient documentation

## 2016-12-23 DIAGNOSIS — I824Y9 Acute embolism and thrombosis of unspecified deep veins of unspecified proximal lower extremity: Secondary | ICD-10-CM | POA: Insufficient documentation

## 2016-12-23 DIAGNOSIS — J45909 Unspecified asthma, uncomplicated: Secondary | ICD-10-CM | POA: Insufficient documentation

## 2016-12-23 DIAGNOSIS — R079 Chest pain, unspecified: Secondary | ICD-10-CM | POA: Insufficient documentation

## 2016-12-23 DIAGNOSIS — Z720 Tobacco use: Secondary | ICD-10-CM

## 2016-12-23 LAB — BASIC METABOLIC PANEL
ANION GAP: 10 (ref 5–15)
BUN: 6 mg/dL (ref 6–20)
CHLORIDE: 104 mmol/L (ref 101–111)
CO2: 23 mmol/L (ref 22–32)
Calcium: 9.2 mg/dL (ref 8.9–10.3)
Creatinine, Ser: 0.71 mg/dL (ref 0.44–1.00)
GFR calc Af Amer: >60 mL/min (ref >60)
GLUCOSE: 95 mg/dL (ref 65–99)
POTASSIUM: 3.9 mmol/L (ref 3.5–5.1)
SODIUM: 137 mmol/L (ref 135–145)

## 2016-12-23 LAB — CBC
HEMATOCRIT: 43.3 % (ref 36.0–46.0)
HEMOGLOBIN: 14.5 g/dL (ref 12.0–15.0)
MCH: 28.8 pg (ref 26.0–34.0)
MCHC: 33.5 g/dL (ref 30.0–36.0)
MCV: 86.1 fL (ref 78.0–100.0)
Platelets: 439 10*3/uL — ABNORMAL HIGH (ref 150–400)
RBC: 5.03 MIL/uL (ref 3.87–5.11)
RDW: 13.2 % (ref 11.5–15.5)
WBC: 7.7 10*3/uL (ref 4.0–10.5)

## 2016-12-23 LAB — I-STAT BETA HCG BLOOD, ED (MC, WL, AP ONLY)

## 2016-12-23 LAB — I-STAT TROPONIN, ED: Troponin i, poc: 0 ng/mL (ref 0.00–0.08)

## 2016-12-23 MED ORDER — APIXABAN 5 MG PO TABS: 10.0000 mg | ORAL_TABLET | Freq: Two times a day (BID) | ORAL | 0 refills | Status: DC

## 2016-12-23 MED ORDER — SULFAMETHOXAZOLE-TRIMETHOPRIM 800-160 MG PO TABS
2.0000 | ORAL_TABLET | Freq: Two times a day (BID) | ORAL | 0 refills | Status: DC
Start: 2016-12-23 — End: 2017-02-04

## 2016-12-23 MED ORDER — ONDANSETRON HCL 4 MG/2ML IJ SOLN
4.0000 mg | Freq: Once | INTRAMUSCULAR | Status: AC
Start: 2016-12-23 — End: 2016-12-23
  Administered 2016-12-23: 4 mg via INTRAVENOUS
  Filled 2016-12-23: qty 2

## 2016-12-23 MED ORDER — HYDROMORPHONE HCL 2 MG/ML IJ SOLN
0.5000 mg | Freq: Once | INTRAMUSCULAR | Status: AC
Start: 2016-12-23 — End: 2016-12-23
  Administered 2016-12-23: 0.5 mg via INTRAVENOUS
  Filled 2016-12-23: qty 1

## 2016-12-23 MED ORDER — SULFAMETHOXAZOLE-TRIMETHOPRIM 800-160 MG PO TABS
1.0000 | ORAL_TABLET | Freq: Once | ORAL | Status: AC
  Administered 2016-12-23: 1 via ORAL
  Filled 2016-12-23: qty 1

## 2016-12-23 MED ORDER — APIXABAN 5 MG PO TABS
5.0000 mg | ORAL_TABLET | Freq: Two times a day (BID) | ORAL | Status: DC
  Administered 2016-12-23: 5 mg via ORAL
  Filled 2016-12-23: qty 1

## 2016-12-23 MED ORDER — IOPAMIDOL (ISOVUE-370) INJECTION 76%
INTRAVENOUS | Status: AC
  Administered 2016-12-23: 90 mL via INTRAVENOUS
  Filled 2016-12-23: qty 100

## 2016-12-23 MED ORDER — ACETAMINOPHEN 325 MG PO TABS
650.0000 mg | ORAL_TABLET | Freq: Once | ORAL | Status: AC
  Administered 2016-12-23: 650 mg via ORAL
  Filled 2016-12-23: qty 2

## 2016-12-23 NOTE — Telephone Encounter (Signed)
ED CM received consult today via CM Consult Pool concerning medication assistance and follow up. CM [facility] [place] CM attempted to contact patient at number listed in record, this was not  a working number. No further CM needs noted.

## 2016-12-23 NOTE — Discharge Instructions (Signed)
Do not hesitate to return to the emergency room for any new, worsening or concerning symptoms. ° °Please obtain primary care using resource guide below. Let them know that you were seen in the emergency room and that they will need to obtain records for further outpatient management. ° ° °

## 2016-12-23 NOTE — ED Provider Notes (Signed)
[facility]PT Provider Note   CSN: [id number] Arrival date & time: 12/23/16  0124     History   Chief Complaint Chief Complaint  Patient presents with  . Chest Pain  . Breast Pain      HPI  Blood pressure 135/75, pulse 102, temperature 98.3 F (36.8 C), resp. rate 20, height 5\' 3"  (1.6 m), weight 103.5 kg, last menstrual period 12/10/2016, SpO2 100 %.  [name] is a 26 y.o. female with past medical history significant for DVT/PE/atrial thrombus, noncompliant with her Eliquis for 2 months due to monetary issues complaining of left breast lump which is painful and erythematous breast onset 4 days ago. She's also complaining of left-sided sharp chest pain with associated palpitations and shortness of breath. She denies any recent mobilizations, calf pain, leg swelling, fever, chills, syncope. She does not take any exogenous estrogen but she does smoke regularly.She has a family history of breast cancer: Father's mother  Past Medical History:  Diagnosis Date  . Asthma   . Coronary artery disease    clotting disorder  . Depression   . PTSD (post-traumatic stress disorder)   . Thrombosis of right atrium   . Tobacco abuse     Patient Active Problem List   Diagnosis Date Noted  . Nausea with vomiting 07/08/2016  . Chest pain 07/07/2016  . Numbness on right side 07/07/2016  . Depression   . PTSD (post-traumatic stress disorder)   . Thrombosis of right atrium   . Tobacco abuse   . Atypical chest pain     Past Surgical History:  Procedure Laterality Date  . CARDIAC SURGERY    . CHOLECYSTECTOMY    . TEE WITHOUT CARDIOVERSION N/A 07/09/2016   Procedure: TRANSESOPHAGEAL ECHOCARDIOGRAM (TEE);  Surgeon: Orpah Cobb, MD;  Location: [facility] ENDOSCOPY;  Service: Cardiovascular;  Laterality: N/A;    OB History    No data available       Home Medications    Prior to Admission medications   Medication Sig Start Date End Date Taking? Authorizing Provider  Vilazodone  HCl 20 MG TABS Take 20 mg by mouth daily. 10/16/16  Yes Historical Provider, MD  apixaban (ELIQUIS) 5 MG TABS tablet Take 2 tablets (10 mg total) by mouth 2 (two) times daily. Take 2 tablets 2 times daily for 7 days followed by one tablet 2 times daily 12/23/16   Joni Reining Shamya Macfadden, PA-C  atorvastatin (LIPITOR) 10 MG tablet Take 1 tablet (10 mg total) by mouth daily at 6 PM. Patient not taking: Reported on 12/23/2016 07/10/16   Alison Murray, MD  sulfamethoxazole-trimethoprim (BACTRIM DS) 800-160 MG tablet Take 2 tablets by mouth 2 (two) times daily. 12/23/16   Joni Reining Autrey Human, PA-C    Family History Family History  Problem Relation Age of Onset  . Bipolar disorder Mother   . Glaucoma Mother   . Arthritis Father     Social History Social History  Substance Use Topics  . Smoking status: Current Every Day Smoker  . Smokeless tobacco: Never Used  . Alcohol use No     Allergies   Fluoxetine; Ceftriaxone; Gabapentin; Morphine and related; Nsaids; Topamax [topiramate]; Toradol [ketorolac tromethamine]; Tramadol; Vicodin [hydrocodone-acetaminophen]; Depakote [divalproex sodium]; Methocarbamol; and Metoclopramide   Review of Systems Review of Systems  10 systems reviewed and found to be negative, except as noted in the HPI.   Physical Exam Updated Vital Signs BP 135/75   Pulse 102   Temp 98.3 F (36.8 C)   Resp 20  Ht 5\' 3"  (1.6 m)   Wt 103.5 kg   LMP 12/10/2016   SpO2 100%   BMI 40.43 kg/m   Physical Exam  Constitutional: She is oriented to person, place, and time. She appears well-developed and well-nourished. No distress.  HENT:  Head: Normocephalic and atraumatic.  Mouth/Throat: Oropharynx is clear and moist.  Eyes: Conjunctivae and EOM are normal. Pupils are equal, round, and reactive to light.  Neck: Normal range of motion. No JVD present. No tracheal deviation present.  Cardiovascular: Normal rate, regular rhythm and intact distal pulses.   Sternotomy scar,  well-healed  Left breast with no palpable mass. Mild erythema and induration no focal fluctuance.  Pulmonary/Chest: Effort normal and breath sounds normal. No stridor. No respiratory distress. She has no wheezes. She has no rales. She exhibits no tenderness.  Abdominal: Soft. She exhibits no distension and no mass. There is no tenderness. There is no rebound and no guarding.  Musculoskeletal: Normal range of motion. She exhibits no edema or tenderness.  No calf asymmetry, superficial collaterals, palpable cords, edema, Homans sign negative bilaterally.    Neurological: She is alert and oriented to person, place, and time.  Skin: Skin is warm. She is not diaphoretic.  Cutting scars to the left forearm well-healed, no signs of infection.  Psychiatric: She has a normal mood and affect.  Nursing note and vitals reviewed.    ED Treatments / Results  Labs (all labs ordered are listed, but only abnormal results are displayed) Labs Reviewed  CBC - Abnormal; Notable for the following:       Result Value   Platelets 439 (*)    All other components within normal limits  BASIC METABOLIC PANEL  I-STAT TROPOININ, ED  I-STAT BETA HCG BLOOD, ED ([facility], [facility], [facility] ONLY)    EKG  EKG Interpretation  Date/Time:  Monday December 23 2016 01:36:36 EST Ventricular Rate:  100 PR Interval:  144 QRS Duration: 72 QT Interval:  338 QTC Calculation: 436 R Axis:   47 Text Interpretation:  Normal sinus rhythm Nonspecific T wave abnormality Abnormal ECG No significant change since last tracing Confirmed by Bebe Shaggy  MD, DONALD ([id number]) on 12/23/2016 2:40:50 AM       Radiology Dg Chest 2 View  Result Date: 12/23/2016 CLINICAL DATA:  Left-sided chest pain tonight. History of right atrial thrombus. EXAM: CHEST  2 VIEW COMPARISON:  Radiographs and CT August 2017 FINDINGS: Post median sternotomy. The cardiomediastinal contours are normal. The lungs are clear. Pulmonary vasculature is normal. No consolidation, pleural  effusion, or pneumothorax. No acute osseous abnormalities are seen. IMPRESSION: No acute abnormality. Electronically Signed   By: Rubye Oaks M.D.   On: 12/23/2016 02:35   Ct Angio Chest Pe W And/or Wo Contrast  Result Date: 12/23/2016 CLINICAL DATA:  Left-sided chest pain and history of DVT EXAM: CT ANGIOGRAPHY CHEST WITH CONTRAST TECHNIQUE: Multidetector CT imaging of the chest was performed using the standard protocol during bolus administration of intravenous contrast. Multiplanar CT image reconstructions and MIPs were obtained to evaluate the vascular anatomy. CONTRAST:  90 mL Isovue 370 IV COMPARISON:  Chest radiograph 12/23/2016 Chest CTA 07/20/2016 FINDINGS: Cardiovascular: There is poor signal to noise ratio because of beam attenuation secondary to patient body habitus. This limits the assessment of the distal pulmonary arteries.Within that limitation, there is no central pulmonary embolus. The main, lobar and proximal segmental branches are normal. The main pulmonary artery is within normal limits for size. There is no CT evidence of acute  right heart strain. The visualized aorta is normal. There is a normal 3-vessel arch branching pattern. Heart size is normal, without pericardial effusion. Mediastinum/Nodes: No mediastinal, hilar or axillary lymphadenopathy. The visualized thyroid and thoracic esophageal course are unremarkable. Lungs/Pleura: No pulmonary nodules or masses. No pleural effusion or pneumothorax. No focal airspace consolidation. No focal pleural abnormality. Upper Abdomen: Contrast bolus timing is not optimized for evaluation of the abdominal organs. Within this limitation, the visualized organs of the upper abdomen are normal. Musculoskeletal: No chest wall abnormality. No acute or significant osseous findings. Review of the MIP images confirms the above findings. IMPRESSION: 1. Examination limited by poor signal to noise ratio closed by beam attenuation secondary to patient body  habitus. Within that limitation, no pulmonary embolus of the central, lobar or proximal segmental pulmonary arteries. 2. No acute thoracic abnormality. Electronically Signed   By: Deatra Robinson M.D.   On: 12/23/2016 04:29    Procedures Procedures (including critical care time)  Medications Ordered in ED Medications  apixaban (ELIQUIS) tablet 5 mg (5 mg Oral Given 12/23/16 0538)  HYDROmorphone (DILAUDID) injection 0.5 mg (0.5 mg Intravenous Given 12/23/16 0342)  ondansetron (ZOFRAN) injection 4 mg (4 mg Intravenous Given 12/23/16 0342)  iopamidol (ISOVUE-370) 76 % injection (90 mLs Intravenous Contrast Given 12/23/16 0400)  acetaminophen (TYLENOL) tablet 650 mg (650 mg Oral Given 12/23/16 0538)  sulfamethoxazole-trimethoprim (BACTRIM DS,SEPTRA DS) 800-160 MG per tablet 1 tablet (1 tablet Oral Given 12/23/16 0538)     Initial Impression / Assessment and Plan / ED Course  I have reviewed the triage vital signs and the nursing notes.  Pertinent labs & imaging results that were available during my care of the patient were reviewed by me and considered in my medical decision making (see chart for details).  Clinical Course     Vitals:   12/23/16 0137  BP: 135/75  Pulse: 102  Resp: 20  Temp: 98.3 F (36.8 C)  SpO2: 100%  Weight: 103.5 kg  Height: 5\' 3"  (1.6 m)    Medications  apixaban (ELIQUIS) tablet 5 mg (5 mg Oral Given 12/23/16 0538)  HYDROmorphone (DILAUDID) injection 0.5 mg (0.5 mg Intravenous Given 12/23/16 0342)  ondansetron (ZOFRAN) injection 4 mg (4 mg Intravenous Given 12/23/16 0342)  iopamidol (ISOVUE-370) 76 % injection (90 mLs Intravenous Contrast Given 12/23/16 0400)  acetaminophen (TYLENOL) tablet 650 mg (650 mg Oral Given 12/23/16 0538)  sulfamethoxazole-trimethoprim (BACTRIM DS,SEPTRA DS) 800-160 MG per tablet 1 tablet (1 tablet Oral Given 12/23/16 0538)    Anylah Scheib is 26 y.o. female presenting with What appears to be left breast cellulitis additionally she has a  left-sided chest pain, this does not appear to be from the cellulitis but today she describes it as sharp and severe. She has a history of DVTs and is noncompliant with her Eliquis. States she can't afford the medications. She is mildly tachycardic. She smokes and is obese as well.   CT with no acute abnormalities, she will be started on Bactrim for a cellulitis in the left breast. I consulted case management to help her try to get her medications. She states that she will be able to pay for her medications of her regular prescription.  Discussed case with attending physician who agrees with care plan and disposition.   Evaluation does not show pathology that would require ongoing emergent intervention or inpatient treatment. Pt is hemodynamically stable and mentating appropriately. Discussed findings and plan with patient/guardian, who agrees with care plan. All questions answered. Return  precautions discussed and outpatient follow up given.    Final Clinical Impressions(s) / ED Diagnoses   Final diagnoses:  Cellulitis of left breast  Chest pain, unspecified type  Deep vein thrombosis (DVT) of proximal lower extremity, unspecified chronicity, unspecified laterality (HCC)  Tobacco use    New Prescriptions Discharge Medication List as of 12/23/2016  5:07 AM       Wynetta EmeryNicole Violia Knopf, PA-C 12/23/16 [id number]    Zadie Rhineonald Wickline, MD 12/23/16 [id number]

## 2016-12-23 NOTE — ED Triage Notes (Signed)
Pt states that for the past four days she has been having L sided CP that started four days ago, pt has a hx of blood clots, one within the heart that was removed in 2014. Vomited x 1, some SOB that started a few hours ago. Pt also reports that the L breast is swollen, with redness and warmth, no drainage noted. Fever two days ago.

## 2016-12-28 ENCOUNTER — Emergency Department (HOSPITAL_COMMUNITY)
Admission: EM | Admit: 2016-12-28 | Discharge: 2016-12-28 | Disposition: A | Discharge status: Home / Self Care | Payer: Self-pay | Attending: Emergency Medicine | Admitting: Emergency Medicine

## 2016-12-28 ENCOUNTER — Encounter (HOSPITAL_COMMUNITY): Payer: Self-pay

## 2016-12-28 ENCOUNTER — Emergency Department (HOSPITAL_COMMUNITY): Payer: Self-pay

## 2016-12-28 DIAGNOSIS — R531 Weakness: Secondary | ICD-10-CM | POA: Insufficient documentation

## 2016-12-28 DIAGNOSIS — R930 Abnormal findings on diagnostic imaging of skull and head, not elsewhere classified: Secondary | ICD-10-CM | POA: Insufficient documentation

## 2016-12-28 DIAGNOSIS — F17200 Nicotine dependence, unspecified, uncomplicated: Secondary | ICD-10-CM | POA: Insufficient documentation

## 2016-12-28 DIAGNOSIS — J45909 Unspecified asthma, uncomplicated: Secondary | ICD-10-CM | POA: Insufficient documentation

## 2016-12-28 DIAGNOSIS — R29810 Facial weakness: Secondary | ICD-10-CM | POA: Insufficient documentation

## 2016-12-28 DIAGNOSIS — Z79899 Other long term (current) drug therapy: Secondary | ICD-10-CM | POA: Insufficient documentation

## 2016-12-28 DIAGNOSIS — I2510 Atherosclerotic heart disease of native coronary artery without angina pectoris: Secondary | ICD-10-CM | POA: Insufficient documentation

## 2016-12-28 LAB — COMPREHENSIVE METABOLIC PANEL
ALBUMIN: 4.1 g/dL (ref 3.5–5.0)
ALT: 20 U/L (ref 14–54)
AST: 22 U/L (ref 15–41)
Alkaline Phosphatase: 55 U/L (ref 38–126)
Anion gap: 11 (ref 5–15)
BUN: 6 mg/dL (ref 6–20)
CHLORIDE: 107 mmol/L (ref 101–111)
CO2: 21 mmol/L — AB (ref 22–32)
CREATININE: 0.75 mg/dL (ref 0.44–1.00)
Calcium: 9.1 mg/dL (ref 8.9–10.3)
GFR calc Af Amer: >60 mL/min (ref >60)
GFR calc non Af Amer: >60 mL/min (ref >60)
Glucose, Bld: 101 mg/dL — ABNORMAL HIGH (ref 65–99)
POTASSIUM: 3.4 mmol/L — AB (ref 3.5–5.1)
SODIUM: 139 mmol/L (ref 135–145)
Total Bilirubin: 0.5 mg/dL (ref 0.3–1.2)
Total Protein: 7.3 g/dL (ref 6.5–8.1)

## 2016-12-28 LAB — DIFFERENTIAL
BASOS ABS: 0 10*3/uL (ref 0.0–0.1)
BASOS PCT: 0 %
EOS ABS: 0.1 10*3/uL (ref 0.0–0.7)
Eosinophils Relative: 1 %
Lymphocytes Relative: 22 %
Lymphs Abs: 2.7 10*3/uL (ref 0.7–4.0)
Monocytes Absolute: 0.8 10*3/uL (ref 0.1–1.0)
Monocytes Relative: 6 %
NEUTROS ABS: 8.6 10*3/uL — AB (ref 1.7–7.7)
NEUTROS PCT: 71 %

## 2016-12-28 LAB — I-STAT CHEM 8, ED
BUN: 5 mg/dL — ABNORMAL LOW (ref 6–20)
CHLORIDE: 106 mmol/L (ref 101–111)
Calcium, Ion: 1.09 mmol/L — ABNORMAL LOW (ref 1.15–1.40)
Creatinine, Ser: 0.7 mg/dL (ref 0.44–1.00)
GLUCOSE: 103 mg/dL — AB (ref 65–99)
HEMATOCRIT: 45 % (ref 36.0–46.0)
HEMOGLOBIN: 15.3 g/dL — AB (ref 12.0–15.0)
POTASSIUM: 3.4 mmol/L — AB (ref 3.5–5.1)
Sodium: 141 mmol/L (ref 135–145)
TCO2: 23 mmol/L (ref 0–100)

## 2016-12-28 LAB — CBC
HEMATOCRIT: 43.8 % (ref 36.0–46.0)
Hemoglobin: 14.7 g/dL (ref 12.0–15.0)
MCH: 28.7 pg (ref 26.0–34.0)
MCHC: 33.6 g/dL (ref 30.0–36.0)
MCV: 85.5 fL (ref 78.0–100.0)
PLATELETS: 399 10*3/uL (ref 150–400)
RBC: 5.12 MIL/uL — ABNORMAL HIGH (ref 3.87–5.11)
RDW: 13.2 % (ref 11.5–15.5)
WBC: 12.1 10*3/uL — AB (ref 4.0–10.5)

## 2016-12-28 LAB — PROTIME-INR
INR: 1.15
PROTHROMBIN TIME: 14.8 s (ref 11.4–15.2)

## 2016-12-28 LAB — I-STAT TROPONIN, ED: TROPONIN I, POC: 0 ng/mL (ref 0.00–0.08)

## 2016-12-28 LAB — APTT: APTT: 32 s (ref 24–36)

## 2016-12-28 LAB — ETHANOL: Alcohol, Ethyl (B): <5 mg/dL (ref <5)

## 2016-12-28 MED ORDER — LORAZEPAM 2 MG/ML IJ SOLN
1.0000 mg | Freq: Once | INTRAMUSCULAR | Status: AC | PRN
  Administered 2016-12-28: 1 mg via INTRAVENOUS
  Filled 2016-12-28: qty 1

## 2016-12-28 MED ORDER — LORAZEPAM 2 MG/ML IJ SOLN
1.0000 mg | Freq: Once | INTRAMUSCULAR | Status: AC
  Administered 2016-12-28: 1 mg via INTRAVENOUS
  Filled 2016-12-28: qty 1

## 2016-12-28 NOTE — ED Notes (Signed)
Pt provided discharge instructions and verbalized understanding, pts speech clear, pt able to move both arms freely and able to dress herself. Pt ambulated to wheelchair at discharge.

## 2016-12-28 NOTE — ED Notes (Signed)
Pt asking for pain med 

## 2016-12-28 NOTE — Consult Note (Signed)
Neurology Consultation Reason for Consult: Right-sided weakness Referring Physician: Oni,A  CC: Right-sided weakness  History is obtained from: Patient  HPI: [name] is a 26 y.o. female with a history of right atrial thrombus who presents with right-sided weakness that started around 11 PM. She states that seem to get worse over time. She does notice some tingling in her fingertips and her toes on that side. She also notes numbness. She states this is similar to her previous "stroke" lamina July of last year.  Per notes at that time, there was concern for possible psychogenic etiology, and she had a negative MRI.  She does however, have a right atrial thrombus is currently taking apixaban for this take her most recent dose this evening.   LKW: 11 PM tpa given?: no, anticoagulated    ROS: A 14 point ROS was performed and is negative except as noted in the HPI.   Past Medical History:  Diagnosis Date  . Asthma   . Coronary artery disease    clotting disorder  . Depression   . PTSD (post-traumatic stress disorder)   . Thrombosis of right atrium   . Tobacco abuse      Family History  Problem Relation Age of Onset  . Bipolar disorder Mother   . Glaucoma Mother   . Arthritis Father      Social History:  reports that she has been smoking.  She has never used smokeless tobacco. She reports that she does not drink alcohol or use drugs.   Exam: Current vital signs: BP 107/70   Pulse 91   Temp 98.4 F (36.9 C) (Oral)   Resp 21   Ht 5\' 3"  (1.6 m)   Wt 103.4 kg (228 lb)   LMP 12/10/2016   SpO2 98%   BMI 40.39 kg/m  Vital signs in last 24 hours: Temp:  [98.4 F (36.9 C)] 98.4 F (36.9 C) (01/20 0250) Pulse Rate:  [84-91] 91 (01/20 0300) Resp:  [21] 21 (01/20 0250) BP: (107)/(53-70) 107/70 (01/20 0300) SpO2:  [98 %-99 %] 98 % (01/20 0300) Weight:  [103.4 kg (228 lb)] 103.4 kg (228 lb) (01/20 0243)   Physical Exam  Constitutional: Appears well-developed  and well-nourished.  Psych: Affect appropriate to situation Eyes: No scleral injection HENT: No OP obstrucion Head: Normocephalic.  Cardiovascular: Normal rate and regular rhythm.  Respiratory: Effort normal and breath sounds normal to anterior ascultation GI: Soft.  No distension. There is no tenderness.  Skin: WDI  Neuro: Mental Status: Patient is awake, alert, oriented to person, place, month, year, and situation. Patient is able to give a clear and coherent history. No signs of aphasia or neglect Cranial Nerves: II: Visual Fields are full. Pupils are equal, round, and reactive to light.   III,IV, VI: EOMI without ptosis or diploplia.  V: Facial sensation is symmetric to touch VII: Facial movement is inconsistent, when asked to smile she appears to have some weakness of the right face, but when asked to puff out her cheeks she holds that side taught. VIII: hearing is intact to voice X: Uvula elevates symmetrically XI: Shoulder shrug is symmetric. XII: tongue is midline without atrophy or fasciculations.  Motor: Tone is normal. Bulk is normal. 5/5 strength was present  On the left side, on the right, she has give way weakness of the right arm and leg. Positive Hoover sign  Sensory: Sensation is decreased in the right arm and leg Cerebellar: She has intact finger-nose-finger bilaterally   I have  reviewed labs in epic and the results pertinent to this consultation are: Normal creatinine  I have reviewed the images obtained: CT head-unremarkable  Impression: 26 year old female with right-sided weakness with multiple findings concerning for nonorganic etiology. She does have an intracardiac thrombus, but she does not have any right-to-left shunt seen on her previous right heart cath. At this time, I think that I would favor an MRI however if this is negative then I would not favor any further diagnostic workup.  Recommendations: 1) MRI brain, MRA head 2) if negative, no  further diagnostic workup at this time.   Ritta SlotMcNeill Elia Keenum, MD [facility] [phone]  If 7pm- 7am, please page neurology on call as listed in AMION.

## 2016-12-28 NOTE — ED Provider Notes (Signed)
[facility]-EMERGENCY DEPT Provider Note   CSN: [id number] Arrival date & time: 12/28/16  0213     History   Chief Complaint Chief Complaint  Patient presents with  . Code Stroke    HPI Kathlynn Swofford is a 26 y.o. female PMH of CAD, depression, PTSD, R atrial thrombosis, Presenting today with right-sided weakness concerning for stroke.  Right-sided facial droop as well. She states she had a TIA last year, she is currently on a blood thinner for a clot in her heart. She was last seen normal at 5 PM. No further history could be obtained.    10 Systems reviewed and are negative for acute change except as noted in the HPI.    HPI  Past Medical History:  Diagnosis Date  . Asthma   . Coronary artery disease    clotting disorder  . Depression   . PTSD (post-traumatic stress disorder)   . Thrombosis of right atrium   . Tobacco abuse     Patient Active Problem List   Diagnosis Date Noted  . Nausea with vomiting 07/08/2016  . Chest pain 07/07/2016  . Numbness on right side 07/07/2016  . Depression   . PTSD (post-traumatic stress disorder)   . Thrombosis of right atrium   . Tobacco abuse   . Atypical chest pain     Past Surgical History:  Procedure Laterality Date  . CARDIAC SURGERY    . CHOLECYSTECTOMY    . TEE WITHOUT CARDIOVERSION N/A 07/09/2016   Procedure: TRANSESOPHAGEAL ECHOCARDIOGRAM (TEE);  Surgeon: Orpah Cobb, MD;  Location: [facility] ENDOSCOPY;  Service: Cardiovascular;  Laterality: N/A;    OB History    No data available       Home Medications    Prior to Admission medications   Medication Sig Start Date End Date Taking? Authorizing Provider  apixaban (ELIQUIS) 5 MG TABS tablet Take 2 tablets (10 mg total) by mouth 2 (two) times daily. Take 2 tablets 2 times daily for 7 days followed by one tablet 2 times daily 12/23/16   Joni Reining Pisciotta, PA-C  atorvastatin (LIPITOR) 10 MG tablet Take 1 tablet (10 mg total) by mouth daily at 6 PM. Patient not taking: Reported on  12/23/2016 07/10/16   Alison Murray, MD  sulfamethoxazole-trimethoprim (BACTRIM DS) 800-160 MG tablet Take 2 tablets by mouth 2 (two) times daily. 12/23/16   Nicole Pisciotta, PA-C  Vilazodone HCl 20 MG TABS Take 20 mg by mouth daily. 10/16/16   Historical Provider, MD    Family History Family History  Problem Relation Age of Onset  . Bipolar disorder Mother   . Glaucoma Mother   . Arthritis Father     Social History Social History  Substance Use Topics  . Smoking status: Current Every Day Smoker  . Smokeless tobacco: Never Used  . Alcohol use No     Allergies   Fluoxetine; Ceftriaxone; Gabapentin; Morphine and related; Nsaids; Topamax [topiramate]; Toradol [ketorolac tromethamine]; Tramadol; Vicodin [hydrocodone-acetaminophen]; Depakote [divalproex sodium]; Methocarbamol; and Metoclopramide   Review of Systems Review of Systems   Physical Exam Updated Vital Signs BP 110/68   Pulse 84   Temp 98.4 F (36.9 C) (Oral)   Resp 23   Ht 5\' 3"  (1.6 m)   Wt 228 lb (103.4 kg)   LMP 12/10/2016   SpO2 98%   BMI 40.39 kg/m   Physical Exam  Constitutional: She is oriented to person, place, and time. She appears well-developed and well-nourished. No distress.  HENT:  Head: Normocephalic and  atraumatic.  Nose: Nose normal.  Mouth/Throat: Oropharynx is clear and moist. No oropharyngeal exudate.  Eyes: Conjunctivae and EOM are normal. Pupils are equal, round, and reactive to light. No scleral icterus.  Neck: Normal range of motion. Neck supple. No JVD present. No tracheal deviation present. No thyromegaly present.  Cardiovascular: Normal rate, regular rhythm and normal heart sounds.  Exam reveals no gallop and no friction rub.   No murmur heard. Pulmonary/Chest: Effort normal and breath sounds normal. No respiratory distress. She has no wheezes. She exhibits no tenderness.  Abdominal: Soft. Bowel sounds are normal. She exhibits no distension and no mass. There is no tenderness. There  is no rebound and no guarding.  Musculoskeletal: Normal range of motion. She exhibits no edema or tenderness.  Lymphadenopathy:    She has no cervical adenopathy.  Neurological: She is alert and oriented to person, place, and time. No cranial nerve deficit. She exhibits normal muscle tone.  Patient will intermittently move all extremities. She does not follow commands well. She is not dyspneic actively and cerebellar testing.  Skin: Skin is warm and dry. No rash noted. No erythema. No pallor.  Nursing note and vitals reviewed.    ED Treatments / Results  Labs (all labs ordered are listed, but only abnormal results are displayed) Labs Reviewed  CBC - Abnormal; Notable for the following:       Result Value   WBC 12.1 (*)    RBC 5.12 (*)    All other components within normal limits  DIFFERENTIAL - Abnormal; Notable for the following:    Neutro Abs 8.6 (*)    All other components within normal limits  COMPREHENSIVE METABOLIC PANEL - Abnormal; Notable for the following:    Potassium 3.4 (*)    CO2 21 (*)    Glucose, Bld 101 (*)    All other components within normal limits  I-STAT CHEM 8, ED - Abnormal; Notable for the following:    Potassium 3.4 (*)    BUN 5 (*)    Glucose, Bld 103 (*)    Calcium, Ion 1.09 (*)    Hemoglobin 15.3 (*)    All other components within normal limits  ETHANOL  PROTIME-INR  APTT  RAPID URINE DRUG SCREEN, HOSP PERFORMED  URINALYSIS, ROUTINE W REFLEX MICROSCOPIC  I-STAT TROPOININ, ED    EKG  EKG Interpretation  Date/Time:  Saturday December 28 2016 02:46:44 EST Ventricular Rate:  89 PR Interval:    QRS Duration: 82 QT Interval:  371 QTC Calculation: 452 R Axis:   60 Text Interpretation:  Sinus rhythm Interpretation limited secondary to artifact No significant change since last tracing Confirmed by Erroll Luna [phone]) on 12/28/2016 4:06:40 AM       Radiology Mr Maxine Glenn Head Wo Contrast  Result Date: 12/28/2016 CLINICAL DATA:  26 y/o   F; right-sided weakness. EXAM: MRI HEAD WITHOUT CONTRAST MRA HEAD WITHOUT CONTRAST TECHNIQUE: Multiplanar, multiecho pulse sequences of the brain and surrounding structures were obtained without intravenous contrast. Angiographic images of the head were obtained using MRA technique without contrast. COMPARISON:  12/28/2016 CT head. 07/08/2016 MRI of the head and MRA of the head. FINDINGS: MRI HEAD FINDINGS Brain: No acute infarction, hemorrhage, hydrocephalus, extra-axial collection or mass lesion. Mild motion degraded T2 FLAIR weighted sequence. Stable few foci of T2 FLAIR hyperintensity in subcortical white matter. Vascular: As below. Skull and upper cervical spine: Normal marrow signal. Sinuses/Orbits: Mild right maxillary sinus mucosal thickening and small mucous retention cyst. No abnormal  signal of mastoid air cells. Unremarkable orbits. Other: None. MRA HEAD FINDINGS Internal carotid arteries:  Patent. Anterior cerebral arteries:  Patent. Middle cerebral arteries: Patent. Anterior communicating artery: Patent. Posterior communicating arteries: Not identified, likely hypoplastic or absent. Posterior cerebral arteries:  Patent. Basilar artery:  Patent. Vertebral arteries:  Patent. No evidence of high-grade stenosis, large vessel occlusion, or aneurysm. IMPRESSION: 1. No acute intracranial abnormality. 2. Stable nonspecific foci of T2 FLAIR hyperintensity in subcortical white matter possibly related to migraine headache or vasculopathy. Atypical distribution for demyelinating. 3. Mild right maxillary sinus disease. 4. Normal MRA of the head. Electronically Signed   By: Mitzi Hansen M.D.   On: 12/28/2016 05:42   Mr Brain Wo Contrast  Result Date: 12/28/2016 CLINICAL DATA:  26 y/o  F; right-sided weakness. EXAM: MRI HEAD WITHOUT CONTRAST MRA HEAD WITHOUT CONTRAST TECHNIQUE: Multiplanar, multiecho pulse sequences of the brain and surrounding structures were obtained without intravenous contrast.  Angiographic images of the head were obtained using MRA technique without contrast. COMPARISON:  12/28/2016 CT head. 07/08/2016 MRI of the head and MRA of the head. FINDINGS: MRI HEAD FINDINGS Brain: No acute infarction, hemorrhage, hydrocephalus, extra-axial collection or mass lesion. Mild motion degraded T2 FLAIR weighted sequence. Stable few foci of T2 FLAIR hyperintensity in subcortical white matter. Vascular: As below. Skull and upper cervical spine: Normal marrow signal. Sinuses/Orbits: Mild right maxillary sinus mucosal thickening and small mucous retention cyst. No abnormal signal of mastoid air cells. Unremarkable orbits. Other: None. MRA HEAD FINDINGS Internal carotid arteries:  Patent. Anterior cerebral arteries:  Patent. Middle cerebral arteries: Patent. Anterior communicating artery: Patent. Posterior communicating arteries: Not identified, likely hypoplastic or absent. Posterior cerebral arteries:  Patent. Basilar artery:  Patent. Vertebral arteries:  Patent. No evidence of high-grade stenosis, large vessel occlusion, or aneurysm. IMPRESSION: 1. No acute intracranial abnormality. 2. Stable nonspecific foci of T2 FLAIR hyperintensity in subcortical white matter possibly related to migraine headache or vasculopathy. Atypical distribution for demyelinating. 3. Mild right maxillary sinus disease. 4. Normal MRA of the head. Electronically Signed   By: Mitzi Hansen M.D.   On: 12/28/2016 05:42   Ct Head Code Stroke W/o Cm  Result Date: 12/28/2016 CLINICAL DATA:  Code stroke. 26 y/o F; right-sided weakness and left-sided facial droop. EXAM: CT HEAD WITHOUT CONTRAST TECHNIQUE: Contiguous axial images were obtained from the base of the skull through the vertex without intravenous contrast. COMPARISON:  07/08/2016 MRI of the brain. 07/07/2016 CT of the head. FINDINGS: Brain: No evidence of acute infarction, hemorrhage, hydrocephalus, extra-axial collection or mass lesion/mass effect. Vascular: No  hyperdense vessel or unexpected calcification. Skull: Normal. Negative for fracture or focal lesion. Sinuses/Orbits: Mild mucosal thickening of the right maxillary sinus. Otherwise the visualized paranasal sinuses and mastoid air cells are normally aerated. Other: High-riding right jugular bulb with thin sigmoid plate. ASPECTS [facility] Stroke Program Early CT Score) - Ganglionic level infarction (caudate, lentiform nuclei, internal capsule, insula, M1-M3 cortex): 7 - Supraganglionic infarction (M4-M6 cortex): 3 Total score (0-10 with 10 being normal): 10 IMPRESSION: 1. No acute intracranial abnormality identified. 2. ASPECTS is 10 3. Mild right maxillary sinus disease. These results were called by telephone at the time of interpretation on 12/28/2016 at 2:37 am to Dr. Amada Jupiter, who verbally acknowledged these results. Electronically Signed   By: Mitzi Hansen M.D.   On: 12/28/2016 02:40    Procedures Procedures (including critical care time)  Medications Ordered in ED Medications  LORazepam (ATIVAN) injection 1 mg (1 mg Intravenous Given  12/28/16 0353)  LORazepam (ATIVAN) injection 1 mg (1 mg Intravenous Given 12/28/16 0446)     Initial Impression / Assessment and Plan / ED Course  I have reviewed the triage vital signs and the nursing notes.  Pertinent labs & imaging results that were available during my care of the patient were reviewed by me and considered in my medical decision making (see chart for details).    Patient presents to the emergency department for possible stroke. Code stroke was called prior to arrival. CT scan of head is negative. Dr. Amada JupiterKirkpatrick with neurology evaluated the patient and does not believe she has a focal stroke. He recommends MRI and if normal she can be discharged. I agree with this plan. She was given Ativan prior to MRI for claustrophobia.   MRI is normal, patient be discharged home with primary care follow-up. Vital signs were within her normal  limits.   Final Clinical Impressions(s) / ED Diagnoses   Final diagnoses:  Right sided weakness    New Prescriptions New Prescriptions   No medications on file     Tomasita CrumbleAdeleke Aqil Goetting, MD 12/28/16 [phone]

## 2016-12-28 NOTE — ED Notes (Signed)
John in MRI contacted this RN stating patient needed something else for anxiety while in scanner, Dr. Mora Bellmanni made aware and gave this RN verbal order for 1mg  ativan IV.

## 2016-12-28 NOTE — ED Notes (Signed)
Pt.dressed into gown and on monitor

## 2016-12-28 NOTE — ED Notes (Signed)
Dr. Mora Bellmanni made aware of patient's need for medication prior to MRI due to claustrophobia, also aware that patient is complaining of nausea and pain at this time.

## 2016-12-28 NOTE — ED Notes (Signed)
Patient transported to MRI 

## 2016-12-28 NOTE — ED Triage Notes (Signed)
Pt arrived at ED with c/o right sided weakness and right sided facial droop. Code stroke activated. Pt has hx of TIA in July of 2017. Pt currently on apixaban with last dose at at 5pm this evening.

## 2017-02-04 ENCOUNTER — Emergency Department (HOSPITAL_COMMUNITY)
Admission: EM | Admit: 2017-02-04 | Discharge: 2017-02-05 | Disposition: A | Discharge status: Home / Self Care | Payer: Medicaid Other | Attending: Emergency Medicine | Admitting: Emergency Medicine

## 2017-02-04 ENCOUNTER — Emergency Department (HOSPITAL_COMMUNITY): Payer: Medicaid Other

## 2017-02-04 ENCOUNTER — Encounter (HOSPITAL_COMMUNITY): Payer: Self-pay | Admitting: Emergency Medicine

## 2017-02-04 DIAGNOSIS — Z87891 Personal history of nicotine dependence: Secondary | ICD-10-CM | POA: Insufficient documentation

## 2017-02-04 DIAGNOSIS — J038 Acute tonsillitis due to other specified organisms: Secondary | ICD-10-CM | POA: Insufficient documentation

## 2017-02-04 DIAGNOSIS — J45909 Unspecified asthma, uncomplicated: Secondary | ICD-10-CM | POA: Insufficient documentation

## 2017-02-04 DIAGNOSIS — I2510 Atherosclerotic heart disease of native coronary artery without angina pectoris: Secondary | ICD-10-CM | POA: Diagnosis not present

## 2017-02-04 DIAGNOSIS — Z7901 Long term (current) use of anticoagulants: Secondary | ICD-10-CM | POA: Diagnosis not present

## 2017-02-04 DIAGNOSIS — B9689 Other specified bacterial agents as the cause of diseases classified elsewhere: Secondary | ICD-10-CM | POA: Diagnosis not present

## 2017-02-04 DIAGNOSIS — Z79899 Other long term (current) drug therapy: Secondary | ICD-10-CM | POA: Insufficient documentation

## 2017-02-04 DIAGNOSIS — R0602 Shortness of breath: Secondary | ICD-10-CM | POA: Diagnosis present

## 2017-02-04 LAB — BASIC METABOLIC PANEL
Anion gap: 11 (ref 5–15)
BUN: 5 mg/dL — AB (ref 6–20)
CHLORIDE: 100 mmol/L — AB (ref 101–111)
CO2: 22 mmol/L (ref 22–32)
Calcium: 9.1 mg/dL (ref 8.9–10.3)
Creatinine, Ser: 0.81 mg/dL (ref 0.44–1.00)
GFR calc Af Amer: >60 mL/min (ref >60)
GFR calc non Af Amer: >60 mL/min (ref >60)
GLUCOSE: 91 mg/dL (ref 65–99)
POTASSIUM: 3.5 mmol/L (ref 3.5–5.1)
Sodium: 133 mmol/L — ABNORMAL LOW (ref 135–145)

## 2017-02-04 LAB — CBC WITH DIFFERENTIAL/PLATELET
Basophils Absolute: 0 10*3/uL (ref 0.0–0.1)
Basophils Relative: 0 %
EOS PCT: 0 %
Eosinophils Absolute: 0 10*3/uL (ref 0.0–0.7)
HCT: 42.6 % (ref 36.0–46.0)
HEMOGLOBIN: 14.2 g/dL (ref 12.0–15.0)
LYMPHS ABS: 1.4 10*3/uL (ref 0.7–4.0)
LYMPHS PCT: 8 %
MCH: 28.7 pg (ref 26.0–34.0)
MCHC: 33.3 g/dL (ref 30.0–36.0)
MCV: 86.1 fL (ref 78.0–100.0)
MONOS PCT: 7 %
Monocytes Absolute: 1.2 10*3/uL — ABNORMAL HIGH (ref 0.1–1.0)
Neutro Abs: 15 10*3/uL — ABNORMAL HIGH (ref 1.7–7.7)
Neutrophils Relative %: 85 %
PLATELETS: 354 10*3/uL (ref 150–400)
RBC: 4.95 MIL/uL (ref 3.87–5.11)
RDW: 13.4 % (ref 11.5–15.5)
WBC: 17.7 10*3/uL — AB (ref 4.0–10.5)

## 2017-02-04 LAB — POC URINE PREG, ED: Preg Test, Ur: NEGATIVE

## 2017-02-04 MED ORDER — SODIUM CHLORIDE 0.9 % IV BOLUS (SEPSIS)
1000.0000 mL | Freq: Once | INTRAVENOUS | Status: AC
Start: 2017-02-04 — End: 2017-02-04
  Administered 2017-02-04: 1000 mL via INTRAVENOUS

## 2017-02-04 MED ORDER — ONDANSETRON HCL 4 MG/2ML IJ SOLN
4.0000 mg | Freq: Once | INTRAMUSCULAR | Status: AC
  Administered 2017-02-04: 4 mg via INTRAVENOUS
  Filled 2017-02-04: qty 2

## 2017-02-04 MED ORDER — FENTANYL CITRATE (PF) 100 MCG/2ML IJ SOLN
50.0000 ug | Freq: Once | INTRAMUSCULAR | Status: AC
  Administered 2017-02-04: 50 ug via INTRAVENOUS
  Filled 2017-02-04: qty 2

## 2017-02-04 MED ORDER — CLINDAMYCIN PHOSPHATE 600 MG/50ML IV SOLN
600.0000 mg | Freq: Once | INTRAVENOUS | Status: AC
  Administered 2017-02-04: 600 mg via INTRAVENOUS
  Filled 2017-02-04: qty 50

## 2017-02-04 MED ORDER — ACETAMINOPHEN 160 MG/5ML PO SOLN
500.0000 mg | Freq: Once | ORAL | Status: DC
  Filled 2017-02-04: qty 20.3

## 2017-02-04 MED ORDER — GI COCKTAIL ~~LOC~~
30.0000 mL | Freq: Once | ORAL | Status: AC
  Administered 2017-02-04: 30 mL via ORAL
  Filled 2017-02-04: qty 30

## 2017-02-04 MED ORDER — ACETAMINOPHEN 650 MG RE SUPP
650.0000 mg | Freq: Once | RECTAL | Status: AC
  Administered 2017-02-04: 650 mg via RECTAL
  Filled 2017-02-04: qty 1

## 2017-02-04 MED ORDER — LIDOCAINE VISCOUS 2 % MT SOLN
15.0000 mL | Freq: Once | OROMUCOSAL | Status: AC
  Administered 2017-02-04: 15 mL via OROMUCOSAL
  Filled 2017-02-04: qty 15

## 2017-02-04 MED ORDER — IOPAMIDOL (ISOVUE-300) INJECTION 61%
INTRAVENOUS | Status: AC
  Administered 2017-02-04: 75 mL
  Filled 2017-02-04: qty 75

## 2017-02-04 MED ORDER — MAGIC MOUTHWASH W/LIDOCAINE: 10.0000 mL | Freq: Four times a day (QID) | ORAL | 0 refills | Status: DC | PRN

## 2017-02-04 MED ORDER — DEXAMETHASONE SODIUM PHOSPHATE 4 MG/ML IJ SOLN
12.0000 mg | Freq: Once | INTRAMUSCULAR | Status: AC
  Administered 2017-02-04: 12 mg via INTRAVENOUS
  Filled 2017-02-04: qty 3

## 2017-02-04 MED ORDER — CLINDAMYCIN HCL 300 MG PO CAPS: 300.0000 mg | ORAL_CAPSULE | Freq: Three times a day (TID) | ORAL | 0 refills | Status: DC

## 2017-02-04 NOTE — ED Triage Notes (Signed)
Pt to ER private vehicle with complaint of sore throat and shortness of breath related to same. Pt reports sore throat didn't start until today but for the past 2 days she has had generalized body aches. On arrival patient cannot speak in complete sentences, voice is muffled, spitting secretions. Tonsils very swollen on exam. A/o x4.

## 2017-02-04 NOTE — ED Notes (Signed)
ED Provider at bedside. 

## 2017-02-04 NOTE — ED Notes (Signed)
Pt reports she is still unable to swallow any water. Pt was able to swallow one sip but says she is still struggling.

## 2017-02-04 NOTE — ED Notes (Signed)
Pt reports she is unable to swallow water despite benzocaine spray. This nurse administered third spray at request of pt, pt was able to swallow benzocaine but reports difficulty with water. Dr. Anitra LauthPlunkett aware. Will continue to encourage fluids.

## 2017-02-04 NOTE — ED Notes (Signed)
Pt attempted to drink a few sips of water but was unable. Pt immediately spit water out and reports "I can't do it.". Water left by pt's bed, encouraged pt to try again in about 10 mins.

## 2017-02-04 NOTE — ED Notes (Signed)
Per Dr. Anitra LauthPlunkett, benzocaine spray applied to patient's throat. Pt given room temperature water and encouraged to try drinking. Pt verbalized understanding but reports she wants to wait a few minutes to try and let the spray work.

## 2017-02-04 NOTE — Discharge Instructions (Signed)
Take your next dose of antibiotics tomorrow morning.  Use the magic mouthwash for throat pain, gargle with this especially before eating a meal. You may need to maintain a liquid diet for 1-2 days until the pain is improved.

## 2017-02-04 NOTE — ED Notes (Signed)
Pt unable to swallow liquid tylenol, began coughing and spit out dose of tylenol. Will notify PA and request rectal tylenol.

## 2017-02-04 NOTE — ED Provider Notes (Signed)
[facility]-EMERGENCY DEPT Provider Note   CSN: [id number] Arrival date & time: 02/04/17  1658     History   Chief Complaint Chief Complaint  Patient presents with  . Shortness of Breath    HPI [name] is a 26 y.o. female with a history of asthma and right atrium thrombosis with surgical intervention, not currently on anticoagulant therapy, presenting with a 2 day history of generalized myalgias, subjective fever (101.3 here) and nonproductive cough, developed sore throat and swollen tonsils just today and now with severe pain, and inability to swallow secretions.  She has had no PO intake today, has tried to swallow water and soft foods without success.  She feels short of breath secondary to her tonsillar swelling.  Has found no alleviators.  The history is provided by the patient.    Past Medical History:  Diagnosis Date  . Asthma   . Coronary artery disease    clotting disorder  . Depression   . PTSD (post-traumatic stress disorder)   . Thrombosis of right atrium   . Tobacco abuse     Patient Active Problem List   Diagnosis Date Noted  . Nausea with vomiting 07/08/2016  . Chest pain 07/07/2016  . Numbness on right side 07/07/2016  . Depression   . PTSD (post-traumatic stress disorder)   . Thrombosis of right atrium   . Tobacco abuse   . Atypical chest pain     Past Surgical History:  Procedure Laterality Date  . CARDIAC SURGERY    . CHOLECYSTECTOMY    . TEE WITHOUT CARDIOVERSION N/A 07/09/2016   Procedure: TRANSESOPHAGEAL ECHOCARDIOGRAM (TEE);  Surgeon: Orpah Cobb, MD;  Location: [facility] ENDOSCOPY;  Service: Cardiovascular;  Laterality: N/A;    OB History    No data available       Home Medications    Prior to Admission medications   Medication Sig Start Date End Date Taking? Authorizing Provider  apixaban (ELIQUIS) 5 MG TABS tablet Take 2 tablets (10 mg total) by mouth 2 (two) times daily. Take 2 tablets 2 times daily for 7 days followed by one tablet 2  times daily 12/23/16   Joni Reining Pisciotta, PA-C  atorvastatin (LIPITOR) 10 MG tablet Take 1 tablet (10 mg total) by mouth daily at 6 PM. Patient not taking: Reported on 12/23/2016 07/10/16   Alison Murray, MD  clindamycin (CLEOCIN) 300 MG capsule Take 1 capsule (300 mg total) by mouth 3 (three) times daily. 02/04/17   Burgess Amor, PA-C  magic mouthwash w/lidocaine SOLN Take 10 mLs by mouth 4 (four) times daily as needed (throat pain). Note to pharmacy - equal parts diphendydramine, aluminum hydroxide and lidocaine HCL 02/04/17   Burgess Amor, PA-C  sulfamethoxazole-trimethoprim (BACTRIM DS) 800-160 MG tablet Take 2 tablets by mouth 2 (two) times daily. 12/23/16   Nicole Pisciotta, PA-C  Vilazodone HCl 20 MG TABS Take 20 mg by mouth daily. 10/16/16   Historical Provider, MD    Family History Family History  Problem Relation Age of Onset  . Bipolar disorder Mother   . Glaucoma Mother   . Arthritis Father     Social History Social History  Substance Use Topics  . Smoking status: Former Smoker    Types: Cigarettes    Quit date: 12/09/2016  . Smokeless tobacco: Never Used  . Alcohol use No     Allergies   Fluoxetine; Ceftriaxone; Gabapentin; Morphine and related; Nsaids; Topamax [topiramate]; Toradol [ketorolac tromethamine]; Tramadol; Vicodin [hydrocodone-acetaminophen]; Depakote [divalproex sodium]; Methocarbamol; and Metoclopramide  Review of Systems Review of Systems  Constitutional: Positive for chills and fever.  HENT: Positive for sore throat. Negative for congestion, ear pain, rhinorrhea, sinus pressure, trouble swallowing and voice change.   Eyes: Negative for discharge.  Respiratory: Positive for cough. Negative for shortness of breath, wheezing and stridor.   Cardiovascular: Negative for chest pain.  Gastrointestinal: Negative for abdominal pain.  Genitourinary: Negative.      Physical Exam Updated Vital Signs BP 110/57 (BP Location: Right Arm)   Pulse 99   Temp 99.2 F (37.3  C) (Oral)   Resp 22   LMP 01/09/2017 (Approximate)   SpO2 96%   Physical Exam  Constitutional: She appears well-developed and well-nourished.  HENT:  Head: Normocephalic and atraumatic.  Right Ear: Tympanic membrane normal.  Left Ear: Tympanic membrane normal.  Nose: Nose normal.  Mouth/Throat: Uvula is midline. Mucous membranes are not dry. Posterior oropharyngeal erythema present. No tonsillar abscesses. Tonsils are 3+ on the right. Tonsils are 3+ on the left. Tonsillar exudate.  Symmetric bilaterally enlarged tonsils, touching but not extending beyond the uvula which is midline.  Erythema and exudate present. No stridor.  Eyes: Conjunctivae are normal.  Neck: Normal range of motion.  Cardiovascular: Normal rate, regular rhythm, normal heart sounds and intact distal pulses.   Pulmonary/Chest: Effort normal and breath sounds normal. No accessory muscle usage. She has no decreased breath sounds. She has no wheezes. She has no rhonchi.  Abdominal: Soft. Bowel sounds are normal. There is no tenderness.  Musculoskeletal: Normal range of motion.  Neurological: She is alert.  Skin: Skin is warm and dry.  Psychiatric: Her mood appears anxious.  Nursing note and vitals reviewed.    ED Treatments / Results  Labs (all labs ordered are listed, but only abnormal results are displayed) Labs Reviewed  CBC WITH DIFFERENTIAL/PLATELET - Abnormal; Notable for the following:       Result Value   WBC 17.7 (*)    Neutro Abs 15.0 (*)    Monocytes Absolute 1.2 (*)    All other components within normal limits  BASIC METABOLIC PANEL - Abnormal; Notable for the following:    Sodium 133 (*)    Chloride 100 (*)    BUN 5 (*)    All other components within normal limits  POC URINE PREG, ED    EKG  EKG Interpretation None       Radiology Ct Soft Tissue Neck W Contrast  Result Date: 02/04/2017 CLINICAL DATA:  Tonsillitis EXAM: CT NECK WITH CONTRAST TECHNIQUE: Multidetector CT imaging of the  neck was performed using the standard protocol following the bolus administration of intravenous contrast. CONTRAST:  75mL ISOVUE-300 IOPAMIDOL (ISOVUE-300) INJECTION 61% COMPARISON:  Brain MRI 12/28/2016 FINDINGS: Pharynx and larynx: The adenoid, palatine and lingual tonsils are enlarged. There is no peritonsillar abscess or fluid collection. The airway remains widely patent. No retropharyngeal effusion, abscess or lymphadenopathy. The larynx is normal. The epiglottis is normal. Salivary glands: The parotid and submandibular glands are normal. No sialolithiasis or salivary ductal dilatation. Thyroid: Normal. Lymph nodes: There bilateral enlarged level 2A lymph nodes, measuring 12 mm on the right and 16 mm on the left. No lower cervical lymphadenopathy. No abnormal density nodes. Vascular: The major cervical vessels are normal. Limited intracranial: Normal. Visualized orbits: Normal. Mastoids and visualized paranasal sinuses: No mastoid effusion. No paranasal sinus fluid levels or advanced mucosal thickening. Skeleton: Normal. Upper chest: Normal. Other: None. IMPRESSION: 1. Diffuse enlargement of the adenoid, palatine and lingual tonsils without  abscess or other fluid collection. The findings are consistent with tonsillopharyngitis. 2. Bilateral reactive level IIa cervical lymphadenopathy. Electronically Signed   By: Deatra Robinson M.D.   On: 02/04/2017 19:50    Procedures Procedures (including critical care time)  Medications Ordered in ED Medications  lidocaine (XYLOCAINE) 2 % viscous mouth solution 15 mL (not administered)  fentaNYL (SUBLIMAZE) injection 50 mcg (not administered)  dexamethasone (DECADRON) injection 12 mg (12 mg Intravenous Given 02/04/17 1734)  sodium chloride 0.9 % bolus 1,000 mL (0 mLs Intravenous Stopped 02/04/17 1908)  clindamycin (CLEOCIN) IVPB 600 mg (0 mg Intravenous Stopped 02/04/17 1927)  ondansetron (ZOFRAN) injection 4 mg (4 mg Intravenous Given 02/04/17 1753)  fentaNYL  (SUBLIMAZE) injection 50 mcg (50 mcg Intravenous Given 02/04/17 1753)  acetaminophen (TYLENOL) suppository 650 mg (650 mg Rectal Given 02/04/17 1816)  iopamidol (ISOVUE-300) 61 % injection (75 mLs  Contrast Given 02/04/17 1929)     Initial Impression / Assessment and Plan / ED Course  I have reviewed the triage vital signs and the nursing notes.  Pertinent labs & imaging results that were available during my care of the patient were reviewed by me and considered in my medical decision making (see chart for details).     Pt given IV fluids, clindamycin, zofran and fentanyl, decadron with no improvement in throat pain.  She has been unable to tolerate po intake, attempt to swallow liquid tylenol not successful.  Will give viscous lidocaine then PO challenge. If tolerated, plan dc home with magic mouthwash, clindamycin.   Final Clinical Impressions(s) / ED Diagnoses   Final diagnoses:  Acute bacterial tonsillitis    New Prescriptions New Prescriptions   CLINDAMYCIN (CLEOCIN) 300 MG CAPSULE    Take 1 capsule (300 mg total) by mouth 3 (three) times daily.   MAGIC MOUTHWASH W/LIDOCAINE SOLN    Take 10 mLs by mouth 4 (four) times daily as needed (throat pain). Note to pharmacy - equal parts diphendydramine, aluminum hydroxide and lidocaine HCL     Burgess Amor, PA-C 02/04/17 2032    Gwyneth Sprout, MD 02/07/17 1317

## 2017-02-04 NOTE — ED Notes (Signed)
Patient transported to CT 

## 2017-02-04 NOTE — ED Notes (Signed)
Pt able to swallow whole GI cocktail with small sips.

## 2017-02-04 NOTE — ED Notes (Signed)
Dr. Plunkett at bedside.  

## 2017-02-05 MED ORDER — OXYCODONE-ACETAMINOPHEN 5-325 MG PO TABS: 1.0000 | ORAL_TABLET | Freq: Four times a day (QID) | ORAL | 0 refills | Status: DC | PRN

## 2017-02-05 NOTE — ED Notes (Signed)
Pt advised to call someone to pick her up and drive her home from the hospital. Explained to pt the risks of driving after fentanyl administration. Pt verbalized understanding and states she will call for a ride home while in the lobby.

## 2017-02-05 NOTE — ED Notes (Signed)
ED Provider at bedside. 

## 2017-03-16 ENCOUNTER — Emergency Department (HOSPITAL_COMMUNITY): Payer: Medicaid Other

## 2017-03-16 ENCOUNTER — Encounter (HOSPITAL_COMMUNITY): Payer: Self-pay

## 2017-03-16 ENCOUNTER — Inpatient Hospital Stay (HOSPITAL_COMMUNITY)
Admission: EM | Admit: 2017-03-16 | Discharge: 2017-03-19 | DRG: 880 | Disposition: A | Discharge status: Home / Self Care | Payer: Medicaid Other | Attending: Internal Medicine | Admitting: Internal Medicine

## 2017-03-16 DIAGNOSIS — J45909 Unspecified asthma, uncomplicated: Secondary | ICD-10-CM | POA: Diagnosis present

## 2017-03-16 DIAGNOSIS — F4310 Post-traumatic stress disorder, unspecified: Secondary | ICD-10-CM | POA: Diagnosis present

## 2017-03-16 DIAGNOSIS — R202 Paresthesia of skin: Secondary | ICD-10-CM | POA: Diagnosis not present

## 2017-03-16 DIAGNOSIS — H538 Other visual disturbances: Secondary | ICD-10-CM | POA: Diagnosis present

## 2017-03-16 DIAGNOSIS — Z87891 Personal history of nicotine dependence: Secondary | ICD-10-CM

## 2017-03-16 DIAGNOSIS — G459 Transient cerebral ischemic attack, unspecified: Secondary | ICD-10-CM | POA: Diagnosis not present

## 2017-03-16 DIAGNOSIS — F449 Dissociative and conversion disorder, unspecified: Principal | ICD-10-CM | POA: Diagnosis present

## 2017-03-16 DIAGNOSIS — Z9049 Acquired absence of other specified parts of digestive tract: Secondary | ICD-10-CM

## 2017-03-16 DIAGNOSIS — R1310 Dysphagia, unspecified: Secondary | ICD-10-CM | POA: Diagnosis present

## 2017-03-16 DIAGNOSIS — Z9114 Patient's other noncompliance with medication regimen: Secondary | ICD-10-CM

## 2017-03-16 DIAGNOSIS — Z7901 Long term (current) use of anticoagulants: Secondary | ICD-10-CM

## 2017-03-16 DIAGNOSIS — I2510 Atherosclerotic heart disease of native coronary artery without angina pectoris: Secondary | ICD-10-CM | POA: Diagnosis present

## 2017-03-16 DIAGNOSIS — Z9119 Patient's noncompliance with other medical treatment and regimen: Secondary | ICD-10-CM

## 2017-03-16 DIAGNOSIS — Z6841 Body Mass Index (BMI) 40.0 and over, adult: Secondary | ICD-10-CM

## 2017-03-16 DIAGNOSIS — Z8261 Family history of arthritis: Secondary | ICD-10-CM

## 2017-03-16 DIAGNOSIS — M79669 Pain in unspecified lower leg: Secondary | ICD-10-CM | POA: Diagnosis present

## 2017-03-16 DIAGNOSIS — Z886 Allergy status to analgesic agent status: Secondary | ICD-10-CM

## 2017-03-16 DIAGNOSIS — Z818 Family history of other mental and behavioral disorders: Secondary | ICD-10-CM

## 2017-03-16 DIAGNOSIS — Z8673 Personal history of transient ischemic attack (TIA), and cerebral infarction without residual deficits: Secondary | ICD-10-CM

## 2017-03-16 DIAGNOSIS — E785 Hyperlipidemia, unspecified: Secondary | ICD-10-CM | POA: Diagnosis present

## 2017-03-16 DIAGNOSIS — N390 Urinary tract infection, site not specified: Secondary | ICD-10-CM | POA: Diagnosis present

## 2017-03-16 DIAGNOSIS — R079 Chest pain, unspecified: Secondary | ICD-10-CM | POA: Diagnosis present

## 2017-03-16 DIAGNOSIS — Z885 Allergy status to narcotic agent status: Secondary | ICD-10-CM

## 2017-03-16 DIAGNOSIS — F32A Depression, unspecified: Secondary | ICD-10-CM | POA: Diagnosis present

## 2017-03-16 DIAGNOSIS — Q7649 Other congenital malformations of spine, not associated with scoliosis: Secondary | ICD-10-CM

## 2017-03-16 DIAGNOSIS — Z888 Allergy status to other drugs, medicaments and biological substances status: Secondary | ICD-10-CM

## 2017-03-16 DIAGNOSIS — Z83511 Family history of glaucoma: Secondary | ICD-10-CM

## 2017-03-16 DIAGNOSIS — F329 Major depressive disorder, single episode, unspecified: Secondary | ICD-10-CM | POA: Diagnosis present

## 2017-03-16 DIAGNOSIS — R0789 Other chest pain: Secondary | ICD-10-CM

## 2017-03-16 DIAGNOSIS — I513 Intracardiac thrombosis, not elsewhere classified: Secondary | ICD-10-CM | POA: Diagnosis present

## 2017-03-16 DIAGNOSIS — K219 Gastro-esophageal reflux disease without esophagitis: Secondary | ICD-10-CM | POA: Diagnosis present

## 2017-03-16 DIAGNOSIS — Z599 Problem related to housing and economic circumstances, unspecified: Secondary | ICD-10-CM

## 2017-03-16 DIAGNOSIS — R531 Weakness: Secondary | ICD-10-CM | POA: Diagnosis not present

## 2017-03-16 DIAGNOSIS — I639 Cerebral infarction, unspecified: Secondary | ICD-10-CM | POA: Diagnosis present

## 2017-03-16 DIAGNOSIS — D689 Coagulation defect, unspecified: Secondary | ICD-10-CM | POA: Diagnosis present

## 2017-03-16 DIAGNOSIS — Z91199 Patient's noncompliance with other medical treatment and regimen due to unspecified reason: Secondary | ICD-10-CM

## 2017-03-16 LAB — CBC
HCT: 43.7 % (ref 36.0–46.0)
Hemoglobin: 14.8 g/dL (ref 12.0–15.0)
MCH: 29 pg (ref 26.0–34.0)
MCHC: 33.9 g/dL (ref 30.0–36.0)
MCV: 85.5 fL (ref 78.0–100.0)
Platelets: 402 10*3/uL — ABNORMAL HIGH (ref 150–400)
RBC: 5.11 MIL/uL (ref 3.87–5.11)
RDW: 13.5 % (ref 11.5–15.5)
WBC: 9.4 10*3/uL (ref 4.0–10.5)

## 2017-03-16 LAB — COMPREHENSIVE METABOLIC PANEL
ALBUMIN: 3.4 g/dL — AB (ref 3.5–5.0)
ALK PHOS: 55 U/L (ref 38–126)
ALT: 9 U/L — ABNORMAL LOW (ref 14–54)
AST: 17 U/L (ref 15–41)
Anion gap: 11 (ref 5–15)
BILIRUBIN TOTAL: 0.3 mg/dL (ref 0.3–1.2)
BUN: 11 mg/dL (ref 6–20)
CO2: 18 mmol/L — ABNORMAL LOW (ref 22–32)
Calcium: 8.8 mg/dL — ABNORMAL LOW (ref 8.9–10.3)
Chloride: 104 mmol/L (ref 101–111)
Creatinine, Ser: 0.73 mg/dL (ref 0.44–1.00)
GFR calc Af Amer: >60 mL/min (ref >60)
GFR calc non Af Amer: >60 mL/min (ref >60)
GLUCOSE: 96 mg/dL (ref 65–99)
POTASSIUM: 3.7 mmol/L (ref 3.5–5.1)
Sodium: 133 mmol/L — ABNORMAL LOW (ref 135–145)
TOTAL PROTEIN: 7.8 g/dL (ref 6.5–8.1)

## 2017-03-16 LAB — CBG MONITORING, ED: Glucose-Capillary: 106 mg/dL — ABNORMAL HIGH (ref 65–99)

## 2017-03-16 LAB — I-STAT TROPONIN, ED: Troponin i, poc: 0 ng/mL (ref 0.00–0.08)

## 2017-03-16 LAB — URINALYSIS, ROUTINE W REFLEX MICROSCOPIC
BILIRUBIN URINE: NEGATIVE
Glucose, UA: NEGATIVE mg/dL
HGB URINE DIPSTICK: NEGATIVE
KETONES UR: NEGATIVE mg/dL
NITRITE: NEGATIVE
PROTEIN: NEGATIVE mg/dL
Specific Gravity, Urine: 1.014 (ref 1.005–1.030)
pH: 5 (ref 5.0–8.0)

## 2017-03-16 LAB — I-STAT CHEM 8, ED
BUN: 13 mg/dL (ref 6–20)
CALCIUM ION: 0.94 mmol/L — AB (ref 1.15–1.40)
CHLORIDE: 105 mmol/L (ref 101–111)
Creatinine, Ser: 0.7 mg/dL (ref 0.44–1.00)
Glucose, Bld: 100 mg/dL — ABNORMAL HIGH (ref 65–99)
HCT: 47 % — ABNORMAL HIGH (ref 36.0–46.0)
Hemoglobin: 16 g/dL — ABNORMAL HIGH (ref 12.0–15.0)
POTASSIUM: 3.6 mmol/L (ref 3.5–5.1)
SODIUM: 135 mmol/L (ref 135–145)
TCO2: 19 mmol/L (ref 0–100)

## 2017-03-16 LAB — PREGNANCY, URINE: PREG TEST UR: NEGATIVE

## 2017-03-16 LAB — APTT: APTT: 26 s (ref 24–36)

## 2017-03-16 LAB — DIFFERENTIAL
BASOS ABS: 0 10*3/uL (ref 0.0–0.1)
Basophils Relative: 0 %
EOS ABS: 0.1 10*3/uL (ref 0.0–0.7)
Eosinophils Relative: 1 %
LYMPHS ABS: 3.6 10*3/uL (ref 0.7–4.0)
LYMPHS PCT: 38 %
Monocytes Absolute: 0.7 10*3/uL (ref 0.1–1.0)
Monocytes Relative: 7 %
NEUTROS PCT: 54 %
Neutro Abs: 5 10*3/uL (ref 1.7–7.7)

## 2017-03-16 LAB — PROTIME-INR
INR: 0.97
Prothrombin Time: 12.9 seconds (ref 11.4–15.2)

## 2017-03-16 MED ORDER — IOPAMIDOL (ISOVUE-370) INJECTION 76%
INTRAVENOUS | Status: AC
  Administered 2017-03-16: 100 mL
  Filled 2017-03-16: qty 100

## 2017-03-16 NOTE — ED Notes (Signed)
Patient to MRI.

## 2017-03-16 NOTE — ED Triage Notes (Signed)
Pt complaining of R leg and R arm weakness and numbness. LSN yesterday. Pt states hx of TIA, denies any blood thinner use. Pt denies any headache or dizziness. Pt a/o x 4 at triage, NAD.

## 2017-03-16 NOTE — ED Triage Notes (Signed)
Pt arrived via POV c/o numbness and weakness right leg, unable to apply weight.  Unequal grips, right sided weakness.  TIA in past.

## 2017-03-16 NOTE — Consult Note (Addendum)
Referring Physician: Dr. Sherry Ruffing    Chief Complaint: Right arm and leg weakness and sensory numbness  HPI: [name] is an 26 y.o. female with a prior history of right atrial thrombosis s/p surgical intervention, clotting disorder, TIA, CAD, asthma, PTSD, depression and tobacco abuse, who presents to the ED complaining of numbness and weakness to her right leg, with inability to apply weight to her leg, in addition to RUE weakness and numbness. LKN was yesterday. She denied headache or dizziness in triage and was NAD. After being moved to ED room 36, the patient stated that she was feeling a sensation of chest heaviness. She was noted to be tachypneic and tachycardic. BP was 123/50. A STAT CT chest showed no acute cardiopulmonary disease, no large central pulmonary embolus and no aortic dissection or aneurysm.Marland Kitchen    She had been started on Eliquis in 2014 after surgical removal of the right atrial thrombus, but then stopped it in 2016 due to change of insurance and financial issues. She presented again to [facility] in late July 2017 with symptoms similar to today's presentation. TEE obtained during that admission on 07/09/16 showed recurrence of right atrial thrombus: The atrium was mildly dilated. There was an apparent, small, irregular, highly mobile thrombus in the atrial cavity and appendage. There was an apparent, medium-sized fixed thrombus in the lateral atrial cavity. She has continued to be off anticoagulation due to financial and insurance issues.  Home medications include atorvastatin, phenergan and ocycodone/acetaminophen.   LSN: Yesterday tPA Given: No: Out of IV tPA time window  Past Medical History:  Diagnosis Date  . Asthma   . Coronary artery disease    clotting disorder  . Depression   . PTSD (post-traumatic stress disorder)   . Thrombosis of right atrium   . Tobacco abuse     Past Surgical History:  Procedure Laterality Date  . CARDIAC SURGERY    . CHOLECYSTECTOMY    . TEE  WITHOUT CARDIOVERSION N/A 07/09/2016   Procedure: TRANSESOPHAGEAL ECHOCARDIOGRAM (TEE);  Surgeon: Dixie Dials, MD;  Location: [facility] ENDOSCOPY;  Service: Cardiovascular;  Laterality: N/A;    Family History  Problem Relation Age of Onset  . Bipolar disorder Mother   . Glaucoma Mother   . Arthritis Father    Social History:  reports that she quit smoking about 3 months ago. Her smoking use included Cigarettes. She has never used smokeless tobacco. She reports that she does not drink alcohol or use drugs.  Allergies:  Allergies  Allergen Reactions  . Fluoxetine Other (See Comments)    Pt told not to take with eliquis  . Ceftriaxone Itching and Other (See Comments)    Red man syndrome  . Gabapentin Other (See Comments)    Confusion/ chest pains  . Morphine And Related Itching and Nausea And Vomiting  . Nsaids Other (See Comments)    Pt told by hematologist not to take  . Topamax [Topiramate] Other (See Comments)    Dizziness, black out spells  . Toradol [Ketorolac Tromethamine] Itching and Other (See Comments)    Felt hot  . Tramadol     Flushed   . Vicodin [Hydrocodone-Acetaminophen] Other (See Comments)    hallucinations  . Depakote [Divalproex Sodium] Rash and Other (See Comments)    Skin felt like it was on fire  . Methocarbamol Rash  . Metoclopramide Rash and Hives    Medications:  acetaminophen (TYLENOL) 325 MG tablet Take 650 mg by mouth every 6 (six) hours as needed for mild  pain. Historical Provider, MD Needs Review  hydrOXYzine (ATARAX/VISTARIL) 25 MG tablet Take 25 mg by mouth 2 (two) times daily as needed for anxiety.  Historical Provider, MD Needs Review  ondansetron (ZOFRAN) 8 MG tablet Take 8 mg by mouth every 8 (eight) hours as needed for nausea or vomiting. Historical Provider, MD Needs Review  oxyCODONE-acetaminophen (PERCOCET/ROXICET) 5-325 MG tablet Take 1-2 tablets by mouth every 6 (six) hours as needed for severe pain. Blanchie Dessert, MD Needs Review   promethazine (PHENERGAN) 25 MG tablet Take 25 mg by mouth every 6 (six) hours as needed for nausea or vomiting. Historical Provider, MD Needs Review  Vilazodone HCl 20 MG TABS Take 40 mg by mouth daily.  Historical Provider, MD Needs Review  apixaban (ELIQUIS) 5 MG TABS tablet  Noncompliant Take 2 tablets (10 mg total) by mouth 2 (two) times daily. Take 2 tablets 2 times daily for 7 days followed by one tablet 2 times daily Monico Blitz, PA-C Needs Review   Patient not taking: Reported on 02/04/2017    atorvastatin (LIPITOR) 10 MG tablet Take 1 tablet (10 mg total) by mouth daily at 6 PM. Robbie Lis, MD Needs Review   Patient not taking: Reported on 12/23/2016    clindamycin (CLEOCIN) 300 MG capsule Take 1 capsule (300 mg total) by mouth 3 (three) times daily. Evalee Jefferson, PA-C Needs Review   Patient not taking: Reported on 03/16/2017    magic mouthwash w/lidocaine SOLN Take 10 mLs by mouth 4 (four) times daily as needed (throat pain). Note to pharmacy - equal parts diphendydramine, aluminum hydroxide and lidocaine HCL Evalee Jefferson, PA-C Needs Review   Patient not taking: Reported on 03/16/2017     ROS: As per HPI.   Physical Examination: Blood pressure (!) 123/50, pulse (!) 104, temperature 98.1 F (36.7 C), temperature source Oral, resp. rate 20, height 5' 3"  (1.6 m), weight 103.4 kg (228 lb), SpO2 99 %.  HEENT: [place]/AT Skin: Old cut marks to left forearm are noted.  Ext: Distal extremities are warm and well perfused.   Neurologic Examination: Mental Status: Alert, oriented, thought content appropriate.  Speech fluent without evidence of aphasia.  Able to follow all commands without difficulty. Cranial Nerves:  II:  Visual fields intact, PERRL III,IV, VI: ptosis not present, EOMI without nystagmus V,VII: smile symmetric, facial temp sensation normal bilaterally VIII: hearing intact to conversation IX,X: No hypophonia XI: shoulder shrug is symmetric XII: midline tongue extension   Motor: RUE: 4/5 proximal and distal with intermittent giveway weakness RLE: Able to wiggle toes. Unable to dorsiflex ankle. Plantar flexion is 2/5 with question of effort dependence. Hip and knee flexion 2/5 with question of effort dependence. Unable to extend leg at knee. LUE: 5/5 LLE: 5/5 Sensory: Decreased temp and FT sensation to RUE and RLE, worse to RUE.   Deep Tendon Reflexes:  1+ biceps, brachioradialis, patella and achilles bilaterally. Toes downgoing.  Cerebellar: No ataxia with FNF bilaterally.  Gait: Deferred due to RLE weakness.   Results for orders placed or performed during the hospital encounter of 03/16/17 (from the past 48 hour(s))  Protime-INR     Status: None   Collection Time: 03/16/17  9:38 PM  Result Value Ref Range   Prothrombin Time 12.9 11.4 - 15.2 seconds   INR 0.97   APTT     Status: None   Collection Time: 03/16/17  9:38 PM  Result Value Ref Range   aPTT 26 24 - 36 seconds  CBC  Status: Abnormal   Collection Time: 03/16/17  9:38 PM  Result Value Ref Range   WBC 9.4 4.0 - 10.5 K/uL   RBC 5.11 3.87 - 5.11 MIL/uL   Hemoglobin 14.8 12.0 - 15.0 g/dL   HCT 43.7 36.0 - 46.0 %   MCV 85.5 78.0 - 100.0 fL   MCH 29.0 26.0 - 34.0 pg   MCHC 33.9 30.0 - 36.0 g/dL   RDW 13.5 11.5 - 15.5 %   Platelets 402 (H) 150 - 400 K/uL  Differential     Status: None   Collection Time: 03/16/17  9:38 PM  Result Value Ref Range   Neutrophils Relative % 54 %   Neutro Abs 5.0 1.7 - 7.7 K/uL   Lymphocytes Relative 38 %   Lymphs Abs 3.6 0.7 - 4.0 K/uL   Monocytes Relative 7 %   Monocytes Absolute 0.7 0.1 - 1.0 K/uL   Eosinophils Relative 1 %   Eosinophils Absolute 0.1 0.0 - 0.7 K/uL   Basophils Relative 0 %   Basophils Absolute 0.0 0.0 - 0.1 K/uL  Comprehensive metabolic panel     Status: Abnormal   Collection Time: 03/16/17  9:38 PM  Result Value Ref Range   Sodium 133 (L) 135 - 145 mmol/L   Potassium 3.7 3.5 - 5.1 mmol/L   Chloride 104 101 - 111 mmol/L   CO2 18  (L) 22 - 32 mmol/L   Glucose, Bld 96 65 - 99 mg/dL   BUN 11 6 - 20 mg/dL   Creatinine, Ser 0.73 0.44 - 1.00 mg/dL   Calcium 8.8 (L) 8.9 - 10.3 mg/dL   Total Protein 7.8 6.5 - 8.1 g/dL   Albumin 3.4 (L) 3.5 - 5.0 g/dL   AST 17 15 - 41 U/L   ALT 9 (L) 14 - 54 U/L   Alkaline Phosphatase 55 38 - 126 U/L   Total Bilirubin 0.3 0.3 - 1.2 mg/dL   GFR calc non Af Amer >60 >60 mL/min   GFR calc Af Amer >60 >60 mL/min    Comment: (NOTE) The eGFR has been calculated using the CKD EPI equation. This calculation has not been validated in all clinical situations. eGFR's persistently <60 mL/min signify possible Chronic Kidney Disease.    Anion gap 11 5 - 15  I-stat troponin, ED     Status: None   Collection Time: 03/16/17  9:52 PM  Result Value Ref Range   Troponin i, poc 0.00 0.00 - 0.08 ng/mL   Comment 3            Comment: Due to the release kinetics of cTnI, a negative result within the first hours of the onset of symptoms does not rule out myocardial infarction with certainty. If myocardial infarction is still suspected, repeat the test at appropriate intervals.   I-Stat Chem 8, ED     Status: Abnormal   Collection Time: 03/16/17  9:54 PM  Result Value Ref Range   Sodium 135 135 - 145 mmol/L   Potassium 3.6 3.5 - 5.1 mmol/L   Chloride 105 101 - 111 mmol/L   BUN 13 6 - 20 mg/dL   Creatinine, Ser 0.70 0.44 - 1.00 mg/dL   Glucose, Bld 100 (H) 65 - 99 mg/dL   Calcium, Ion 0.94 (L) 1.15 - 1.40 mmol/L   TCO2 19 0 - 100 mmol/L   Hemoglobin 16.0 (H) 12.0 - 15.0 g/dL   HCT 47.0 (H) 36.0 - 46.0 %  CBG monitoring, ED  Status: Abnormal   Collection Time: 03/16/17 10:36 PM  Result Value Ref Range   Glucose-Capillary 106 (H) 65 - 99 mg/dL   Ct Head Wo Contrast  Result Date: 03/16/2017 CLINICAL DATA:  Right leg and arm weakness and numbness. EXAM: CT HEAD WITHOUT CONTRAST TECHNIQUE: Contiguous axial images were obtained from the base of the skull through the vertex without intravenous  contrast. COMPARISON:  12/28/2016 and 07/07/2016 FINDINGS: Brain: Ventricles, cisterns and other CSF spaces are within normal. There is no mass, mass effect, shift of midline structures or acute hemorrhage. No evidence of acute infarction. Vascular: Normal. Skull: Normal. Sinuses/Orbits: The orbits are normal. Subtle focal opacification over the lateral wall the right maxillary sinus. Other: None. IMPRESSION: No acute intracranial findings. Minimal chronic inflammatory change right maxillary sinus. Electronically Signed   By: Marin Olp M.D.   On: 03/16/2017 22:17    Assessment: 26 y.o. female with weakness and sensory numbness involving her right arm and leg.  1. CT head shows no intracranial abnormality.  2. MRI brain shows no acute intracranial abnormality. Stable nonspecific foci of T2 FLAIR hyperintensity within subcortical white matter of the frontal lobes are noted, possibly related to migraine headaches or vasculopathy; the hyperintensities are minimal to mild in extent.  3. Normal MRA of the head without contrast. 4. Normal MRV of the head with and without contrast. 5. EKG reveals sinus tachycardia. No atrial fibrillation.  Plan: 1. MRI cervical spine with and without contrast to rule out cervical cord lesion. This test is felt likely to be low yield as motor and small fiber sensory findings are on the same side, which cannot be explained by a single spinal cord lesion.  2. PT consult 3. OT consult 4. TTE to evaluate for progression versus resolution of right atrial thrombus. If negative, obtain TEE.  5. Would restart therapeutic anticoagulation.  6. Cardiology consult regarding #4 and #5 above.  7. Telemetry monitoring 8. Frequent neuro checks 9. Carotid ultrasound.  10. Social work consult regarding insurance and inability to afford Eliquis prescription.   @Electronically  signed: Dr. Kerney Elbe  03/16/2017, 11:09 PM

## 2017-03-16 NOTE — ED Provider Notes (Signed)
[facility]-EMERGENCY DEPT Provider Note   CSN: [id number] Arrival date & time: 03/16/17  2124     History   Chief Complaint Chief Complaint  Patient presents with  . Numbness  . Chest Pain    HPI [name] is a 26 y.o. female.  Patient with a history of PTSD, atrial thrombus, DVT/PE, TIA, medication noncompliance with Eliquis, atypical chest pain presents with complaint of chest discomfort that woke her from sleep at around 4:00 am today. Chest tightness has been persistent throughout the day, is right sided and radiates around to right upper back. She endorses SOB. No cough or fever. She has had nausea and vomiting today. Later in the morning she started having right arm numbness and right leg weakness. The lower extremity weakness worsened through the day causing her to need her walker for ambulation. The right arm numbness was associated with weak grips as well. She denies headache. She reports symptoms of chest tightness and shortness of breath are similar to previous PE. No leg pain.    The history is provided by the patient. No language interpreter was used.  Chest Pain   Associated symptoms include nausea, numbness, shortness of breath, vomiting and weakness. Pertinent negatives include no abdominal pain, no cough, no fever and no headaches.    Past Medical History:  Diagnosis Date  . Asthma   . Coronary artery disease    clotting disorder  . Depression   . PTSD (post-traumatic stress disorder)   . Thrombosis of right atrium   . Tobacco abuse     Patient Active Problem List   Diagnosis Date Noted  . Nausea with vomiting 07/08/2016  . Chest pain 07/07/2016  . Numbness on right side 07/07/2016  . Depression   . PTSD (post-traumatic stress disorder)   . Thrombosis of right atrium   . Tobacco abuse   . Atypical chest pain     Past Surgical History:  Procedure Laterality Date  . CARDIAC SURGERY    . CHOLECYSTECTOMY    . TEE WITHOUT CARDIOVERSION N/A 07/09/2016   Procedure: TRANSESOPHAGEAL ECHOCARDIOGRAM (TEE);  Surgeon: Orpah Cobb, MD;  Location: [facility] ENDOSCOPY;  Service: Cardiovascular;  Laterality: N/A;    OB History    No data available       Home Medications    Prior to Admission medications   Medication Sig Start Date End Date Taking? Authorizing Provider  apixaban (ELIQUIS) 5 MG TABS tablet Take 2 tablets (10 mg total) by mouth 2 (two) times daily. Take 2 tablets 2 times daily for 7 days followed by one tablet 2 times daily Patient not taking: Reported on 02/04/2017 12/23/16   Joni Reining Pisciotta, PA-C  atorvastatin (LIPITOR) 10 MG tablet Take 1 tablet (10 mg total) by mouth daily at 6 PM. Patient not taking: Reported on 12/23/2016 07/10/16   Alison Murray, MD  clindamycin (CLEOCIN) 300 MG capsule Take 1 capsule (300 mg total) by mouth 3 (three) times daily. 02/04/17   Burgess Amor, PA-C  hydrOXYzine (ATARAX/VISTARIL) 25 MG tablet Take 25 mg by mouth 2 (two) times daily as needed.    Historical Provider, MD  magic mouthwash w/lidocaine SOLN Take 10 mLs by mouth 4 (four) times daily as needed (throat pain). Note to pharmacy - equal parts diphendydramine, aluminum hydroxide and lidocaine HCL 02/04/17   Burgess Amor, PA-C  mirtazapine (REMERON) 15 MG tablet Take 15 mg by mouth at bedtime.    Historical Provider, MD  oxyCODONE-acetaminophen (PERCOCET/ROXICET) 5-325 MG tablet Take 1-2 tablets  by mouth every 6 (six) hours as needed for severe pain. 02/05/17   Gwyneth Sprout, MD  Vilazodone HCl 20 MG TABS Take 40 mg by mouth daily.  10/16/16   Historical Provider, MD    Family History Family History  Problem Relation Age of Onset  . Bipolar disorder Mother   . Glaucoma Mother   . Arthritis Father     Social History Social History  Substance Use Topics  . Smoking status: Former Smoker    Types: Cigarettes    Quit date: 12/09/2016  . Smokeless tobacco: Never Used  . Alcohol use No     Allergies   Fluoxetine; Ceftriaxone; Gabapentin; Morphine and  related; Nsaids; Topamax [topiramate]; Toradol [ketorolac tromethamine]; Tramadol; Vicodin [hydrocodone-acetaminophen]; Depakote [divalproex sodium]; Methocarbamol; and Metoclopramide   Review of Systems Review of Systems  Constitutional: Negative for chills and fever.  HENT: Negative.   Respiratory: Positive for chest tightness and shortness of breath. Negative for cough.   Cardiovascular: Positive for chest pain.  Gastrointestinal: Positive for nausea and vomiting. Negative for abdominal pain.  Musculoskeletal:       See HPI.  Neurological: Positive for weakness and numbness. Negative for headaches.     Physical Exam Updated Vital Signs BP 123/74   Pulse 100   Temp 98.1 F (36.7 C) (Oral)   Resp (!) 26   Ht  (1.6 m)   Wt 103.4 kg   SpO2 97%   BMI 40.39 kg/m   Physical Exam  Constitutional: She is oriented to person, place, and time. She appears well-developed and well-nourished. No distress.  HENT:  Head: Normocephalic.  Neck: Normal range of motion. Neck supple.  Cardiovascular: Regular rhythm and intact distal pulses.  Tachycardia present.   No murmur heard. No carotid bruit.  Pulmonary/Chest: Effort normal and breath sounds normal. She has no wheezes. She has no rales.  Abdominal: Soft. Bowel sounds are normal. There is no tenderness. There is no rebound and no guarding.  Musculoskeletal: Normal range of motion.  Neurological: She is alert and oriented to person, place, and time.  No facial asymmetry. CN's 3-12 grossly intact. Finger-to-nose coordination without deficit or delay. Speech is clear, focused and goal oriented. Right grip weak compared to left and right LE has weak plantar and dorsiflexion when compared to left. Reflexes diminished on right.   Skin: Skin is warm and dry. She is not diaphoretic. No pallor.  Psychiatric: She has a normal mood and affect.     ED Treatments / Results  Labs (all labs ordered are listed, but only abnormal results are  displayed) Labs Reviewed  CBC - Abnormal; Notable for the following:       Result Value   Platelets 402 (*)    All other components within normal limits  COMPREHENSIVE METABOLIC PANEL - Abnormal; Notable for the following:    Sodium 133 (*)    CO2 18 (*)    Calcium 8.8 (*)    Albumin 3.4 (*)    ALT 9 (*)    All other components within normal limits  I-STAT CHEM 8, ED - Abnormal; Notable for the following:    Glucose, Bld 100 (*)    Calcium, Ion 0.94 (*)    Hemoglobin 16.0 (*)    HCT 47.0 (*)    All other components within normal limits  PROTIME-INR  APTT  DIFFERENTIAL  I-STAT TROPOININ, ED  CBG MONITORING, ED    EKG  EKG Interpretation None  Radiology Ct Head Wo Contrast  Result Date: 03/16/2017 CLINICAL DATA:  Right leg and arm weakness and numbness. EXAM: CT HEAD WITHOUT CONTRAST TECHNIQUE: Contiguous axial images were obtained from the base of the skull through the vertex without intravenous contrast. COMPARISON:  12/28/2016 and 07/07/2016 FINDINGS: Brain: Ventricles, cisterns and other CSF spaces are within normal. There is no mass, mass effect, shift of midline structures or acute hemorrhage. No evidence of acute infarction. Vascular: Normal. Skull: Normal. Sinuses/Orbits: The orbits are normal. Subtle focal opacification over the lateral wall the right maxillary sinus. Other: None. IMPRESSION: No acute intracranial findings. Minimal chronic inflammatory change right maxillary sinus. Electronically Signed   By: Elberta Fortis M.D.   On: 03/16/2017 22:17    Procedures Procedures (including critical care time)  Medications Ordered in ED Medications - No data to display   Initial Impression / Assessment and Plan / ED Course  I have reviewed the triage vital signs and the nursing notes.  Pertinent labs & imaging results that were available during my care of the patient were reviewed by me and considered in my medical decision making (see chart for details).      Patient with a complicated medical history presents with chest pain, SOB, right sided weakness and numbness x 1 day.   She was seen and evaluated by neurology who recommended repeat MRI's, which were resulted as negative. Attempt to ambulate the patient finds that she still cannot hold her weight on her right leg. She is unsteady and a fall risk.   CT chest negative for PE. On TEE 07/2016, a right atrial thrombus was persistent. This will need to be re-evaluated as well since she has been off her anticoagulant therapy for the past 2 months.   Discussed with neuro who feels that, despite negative studies, the patient would be best served by admission for TEE, restart of anticoagulation and observation for improvement/resolution of right sided weakness, possible PT.   Hospitalist paged for admission.  Final Clinical Impressions(s) / ED Diagnoses   Final diagnoses:  None   1. Right sided weakness 2. History of atrial thrombus, off anticoagulation 3, medication noncompliance 4. Chest pain 5. Persistent nausea.  New Prescriptions New Prescriptions   No medications on file     Elpidio Anis, PA-C 03/17/17 0413    Canary Brim Tegeler, MD 03/17/17 1316

## 2017-03-17 ENCOUNTER — Inpatient Hospital Stay (HOSPITAL_COMMUNITY): Payer: Medicaid Other

## 2017-03-17 ENCOUNTER — Inpatient Hospital Stay (HOSPITAL_COMMUNITY)
Admit: 2017-03-17 | Discharge: 2017-03-17 | Disposition: A | Discharge status: Home / Self Care | Payer: Medicaid Other | Attending: Internal Medicine | Admitting: Internal Medicine

## 2017-03-17 DIAGNOSIS — Z886 Allergy status to analgesic agent status: Secondary | ICD-10-CM | POA: Diagnosis not present

## 2017-03-17 DIAGNOSIS — F329 Major depressive disorder, single episode, unspecified: Secondary | ICD-10-CM | POA: Diagnosis not present

## 2017-03-17 DIAGNOSIS — I639 Cerebral infarction, unspecified: Secondary | ICD-10-CM | POA: Diagnosis present

## 2017-03-17 DIAGNOSIS — Z87891 Personal history of nicotine dependence: Secondary | ICD-10-CM | POA: Diagnosis not present

## 2017-03-17 DIAGNOSIS — K219 Gastro-esophageal reflux disease without esophagitis: Secondary | ICD-10-CM | POA: Diagnosis present

## 2017-03-17 DIAGNOSIS — Z888 Allergy status to other drugs, medicaments and biological substances status: Secondary | ICD-10-CM | POA: Diagnosis not present

## 2017-03-17 DIAGNOSIS — I208 Other forms of angina pectoris: Secondary | ICD-10-CM

## 2017-03-17 DIAGNOSIS — Z9119 Patient's noncompliance with other medical treatment and regimen: Secondary | ICD-10-CM | POA: Diagnosis not present

## 2017-03-17 DIAGNOSIS — E785 Hyperlipidemia, unspecified: Secondary | ICD-10-CM | POA: Diagnosis not present

## 2017-03-17 DIAGNOSIS — M79669 Pain in unspecified lower leg: Secondary | ICD-10-CM | POA: Diagnosis present

## 2017-03-17 DIAGNOSIS — R531 Weakness: Secondary | ICD-10-CM

## 2017-03-17 DIAGNOSIS — R079 Chest pain, unspecified: Secondary | ICD-10-CM

## 2017-03-17 DIAGNOSIS — Z818 Family history of other mental and behavioral disorders: Secondary | ICD-10-CM | POA: Diagnosis not present

## 2017-03-17 DIAGNOSIS — I2510 Atherosclerotic heart disease of native coronary artery without angina pectoris: Secondary | ICD-10-CM | POA: Diagnosis not present

## 2017-03-17 DIAGNOSIS — Z7901 Long term (current) use of anticoagulants: Secondary | ICD-10-CM | POA: Diagnosis not present

## 2017-03-17 DIAGNOSIS — Z9114 Patient's other noncompliance with medication regimen: Secondary | ICD-10-CM | POA: Diagnosis not present

## 2017-03-17 DIAGNOSIS — Z8673 Personal history of transient ischemic attack (TIA), and cerebral infarction without residual deficits: Secondary | ICD-10-CM | POA: Diagnosis not present

## 2017-03-17 DIAGNOSIS — I513 Intracardiac thrombosis, not elsewhere classified: Secondary | ICD-10-CM | POA: Diagnosis not present

## 2017-03-17 DIAGNOSIS — G459 Transient cerebral ischemic attack, unspecified: Secondary | ICD-10-CM | POA: Insufficient documentation

## 2017-03-17 DIAGNOSIS — R1310 Dysphagia, unspecified: Secondary | ICD-10-CM | POA: Diagnosis not present

## 2017-03-17 DIAGNOSIS — D689 Coagulation defect, unspecified: Secondary | ICD-10-CM | POA: Diagnosis not present

## 2017-03-17 DIAGNOSIS — M79661 Pain in right lower leg: Secondary | ICD-10-CM

## 2017-03-17 DIAGNOSIS — J45909 Unspecified asthma, uncomplicated: Secondary | ICD-10-CM | POA: Diagnosis not present

## 2017-03-17 DIAGNOSIS — Z8261 Family history of arthritis: Secondary | ICD-10-CM | POA: Diagnosis not present

## 2017-03-17 DIAGNOSIS — Z6841 Body Mass Index (BMI) 40.0 and over, adult: Secondary | ICD-10-CM | POA: Diagnosis not present

## 2017-03-17 DIAGNOSIS — Z9049 Acquired absence of other specified parts of digestive tract: Secondary | ICD-10-CM | POA: Diagnosis not present

## 2017-03-17 DIAGNOSIS — N390 Urinary tract infection, site not specified: Secondary | ICD-10-CM | POA: Diagnosis present

## 2017-03-17 DIAGNOSIS — H538 Other visual disturbances: Secondary | ICD-10-CM | POA: Diagnosis present

## 2017-03-17 DIAGNOSIS — Z83511 Family history of glaucoma: Secondary | ICD-10-CM | POA: Diagnosis not present

## 2017-03-17 DIAGNOSIS — F449 Dissociative and conversion disorder, unspecified: Secondary | ICD-10-CM | POA: Diagnosis not present

## 2017-03-17 DIAGNOSIS — I200 Unstable angina: Secondary | ICD-10-CM | POA: Diagnosis not present

## 2017-03-17 DIAGNOSIS — M79662 Pain in left lower leg: Secondary | ICD-10-CM | POA: Diagnosis not present

## 2017-03-17 DIAGNOSIS — Z885 Allergy status to narcotic agent status: Secondary | ICD-10-CM | POA: Diagnosis not present

## 2017-03-17 DIAGNOSIS — F4310 Post-traumatic stress disorder, unspecified: Secondary | ICD-10-CM | POA: Diagnosis not present

## 2017-03-17 DIAGNOSIS — F320 Major depressive disorder, single episode, mild: Secondary | ICD-10-CM

## 2017-03-17 LAB — ECHOCARDIOGRAM COMPLETE
Height: 63 in
WEIGHTICAEL: 3648 [oz_av]

## 2017-03-17 LAB — LIPASE, BLOOD: Lipase: 15 U/L (ref 11–51)

## 2017-03-17 LAB — VAS US CAROTID
LCCADDIAS: -34 cm/s
LCCADSYS: -108 cm/s
LCCAPDIAS: 30 cm/s
LCCAPSYS: 165 cm/s
LEFT ECA DIAS: -36 cm/s
LEFT VERTEBRAL DIAS: 25 cm/s
LICAPSYS: -85 cm/s
Left ICA dist dias: -32 cm/s
Left ICA dist sys: -81 cm/s
Left ICA prox dias: -35 cm/s
RCCAPSYS: 104 cm/s
RIGHT ECA DIAS: -29 cm/s
RIGHT VERTEBRAL DIAS: 13 cm/s
Right CCA prox dias: 21 cm/s

## 2017-03-17 LAB — RAPID URINE DRUG SCREEN, HOSP PERFORMED
Amphetamines: NOT DETECTED
BENZODIAZEPINES: NOT DETECTED
Barbiturates: NOT DETECTED
COCAINE: NOT DETECTED
OPIATES: NOT DETECTED
TETRAHYDROCANNABINOL: NOT DETECTED

## 2017-03-17 LAB — HIV ANTIBODY (ROUTINE TESTING W REFLEX): HIV SCREEN 4TH GENERATION: NONREACTIVE

## 2017-03-17 LAB — BRAIN NATRIURETIC PEPTIDE: B NATRIURETIC PEPTIDE 5: 9.3 pg/mL (ref 0.0–100.0)

## 2017-03-17 LAB — TROPONIN I: Troponin I: <0.03 ng/mL (ref <0.03)

## 2017-03-17 LAB — HEPARIN LEVEL (UNFRACTIONATED)
Heparin Unfractionated: 0.47 IU/mL (ref 0.30–0.70)
Heparin Unfractionated: 0.27 IU/mL — ABNORMAL LOW (ref 0.30–0.70)

## 2017-03-17 MED ORDER — LORAZEPAM 2 MG/ML IJ SOLN
1.0000 mg | Freq: Once | INTRAMUSCULAR | Status: AC
  Administered 2017-03-17: 1 mg via INTRAVENOUS
  Filled 2017-03-17: qty 1

## 2017-03-17 MED ORDER — FENTANYL CITRATE (PF) 100 MCG/2ML IJ SOLN
100.0000 ug | Freq: Once | INTRAMUSCULAR | Status: AC
  Administered 2017-03-17: 100 ug via INTRAVENOUS
  Filled 2017-03-17: qty 2

## 2017-03-17 MED ORDER — HEPARIN (PORCINE) IN NACL 100-0.45 UNIT/ML-% IJ SOLN
1400.0000 [IU]/h | INTRAMUSCULAR | Status: AC
  Administered 2017-03-17 (×2): 1250 [IU]/h via INTRAVENOUS
  Filled 2017-03-17 (×4): qty 250

## 2017-03-17 MED ORDER — PROMETHAZINE HCL 25 MG/ML IJ SOLN
12.5000 mg | Freq: Once | INTRAMUSCULAR | Status: AC
  Administered 2017-03-17: 12.5 mg via INTRAVENOUS
  Filled 2017-03-17: qty 1

## 2017-03-17 MED ORDER — ONDANSETRON HCL 4 MG/2ML IJ SOLN
4.0000 mg | Freq: Three times a day (TID) | INTRAMUSCULAR | Status: DC | PRN
  Administered 2017-03-17: 4 mg via INTRAVENOUS
  Filled 2017-03-17: qty 2

## 2017-03-17 MED ORDER — OXYCODONE HCL 5 MG/5ML PO SOLN
7.5000 mg | ORAL | Status: DC | PRN
  Administered 2017-03-17 – 2017-03-18 (×6): 7.5 mg via ORAL
  Filled 2017-03-17 (×6): qty 10

## 2017-03-17 MED ORDER — NITROGLYCERIN 0.4 MG SL SUBL: 0.4000 mg | SUBLINGUAL_TABLET | SUBLINGUAL | Status: DC | PRN

## 2017-03-17 MED ORDER — PANTOPRAZOLE SODIUM 40 MG PO PACK
40.0000 mg | PACK | Freq: Every day | ORAL | Status: DC
  Administered 2017-03-17 – 2017-03-19 (×3): 40 mg via ORAL
  Filled 2017-03-17 (×3): qty 20

## 2017-03-17 MED ORDER — PROMETHAZINE HCL 25 MG PO TABS
12.5000 mg | ORAL_TABLET | Freq: Four times a day (QID) | ORAL | Status: DC | PRN
  Administered 2017-03-17 – 2017-03-18 (×4): 12.5 mg via ORAL
  Filled 2017-03-17 (×5): qty 1

## 2017-03-17 MED ORDER — SODIUM CHLORIDE 0.9 % IV SOLN
INTRAVENOUS | Status: DC
  Administered 2017-03-17 – 2017-03-19 (×4): via INTRAVENOUS

## 2017-03-17 MED ORDER — ACETAMINOPHEN 325 MG PO TABS: 650.0000 mg | ORAL_TABLET | Freq: Four times a day (QID) | ORAL | Status: DC | PRN

## 2017-03-17 MED ORDER — SUCRALFATE 1 GM/10ML PO SUSP
1.0000 g | Freq: Three times a day (TID) | ORAL | Status: DC
  Administered 2017-03-17 – 2017-03-19 (×8): 1 g via ORAL
  Filled 2017-03-17 (×8): qty 10

## 2017-03-17 MED ORDER — DEXTROSE 5 % IV SOLN
1.0000 g | Freq: Three times a day (TID) | INTRAVENOUS | Status: DC
  Administered 2017-03-17: 1 g via INTRAVENOUS
  Filled 2017-03-17 (×2): qty 1

## 2017-03-17 MED ORDER — SENNOSIDES-DOCUSATE SODIUM 8.6-50 MG PO TABS
1.0000 | ORAL_TABLET | Freq: Every evening | ORAL | Status: DC | PRN
  Filled 2017-03-17: qty 1

## 2017-03-17 MED ORDER — VILAZODONE HCL 20 MG PO TABS
40.0000 mg | ORAL_TABLET | Freq: Every day | ORAL | Status: DC
  Filled 2017-03-17: qty 2

## 2017-03-17 MED ORDER — HYDROXYZINE HCL 25 MG PO TABS
25.0000 mg | ORAL_TABLET | Freq: Two times a day (BID) | ORAL | Status: DC | PRN
  Administered 2017-03-17 – 2017-03-19 (×5): 25 mg via ORAL
  Filled 2017-03-17 (×5): qty 1

## 2017-03-17 MED ORDER — GADOBENATE DIMEGLUMINE 529 MG/ML IV SOLN
20.0000 mL | Freq: Once | INTRAVENOUS | Status: AC | PRN
  Administered 2017-03-17: 20 mL via INTRAVENOUS

## 2017-03-17 MED ORDER — SODIUM CHLORIDE 0.9 % IV SOLN: INTRAVENOUS | Status: DC

## 2017-03-17 MED ORDER — VILAZODONE HCL 40 MG PO TABS
40.0000 mg | ORAL_TABLET | Freq: Every day | ORAL | Status: DC
  Administered 2017-03-17 – 2017-03-19 (×3): 40 mg via ORAL
  Filled 2017-03-17 (×3): qty 1

## 2017-03-17 MED ORDER — HEPARIN BOLUS VIA INFUSION
5000.0000 [IU] | Freq: Once | INTRAVENOUS | Status: AC
  Administered 2017-03-17: 5000 [IU] via INTRAVENOUS
  Filled 2017-03-17: qty 5000

## 2017-03-17 MED ORDER — CLONAZEPAM 0.5 MG PO TABS
1.0000 mg | ORAL_TABLET | Freq: Once | ORAL | Status: AC
  Administered 2017-03-17: 1 mg via ORAL
  Filled 2017-03-17: qty 2

## 2017-03-17 MED ORDER — ZOLPIDEM TARTRATE 5 MG PO TABS
5.0000 mg | ORAL_TABLET | Freq: Every evening | ORAL | Status: DC | PRN
  Administered 2017-03-17 – 2017-03-18 (×2): 5 mg via ORAL
  Filled 2017-03-17 (×2): qty 1

## 2017-03-17 MED ORDER — STROKE: EARLY STAGES OF RECOVERY BOOK
Freq: Once | Status: DC
  Filled 2017-03-17: qty 1

## 2017-03-17 MED ORDER — OXYCODONE-ACETAMINOPHEN 5-325 MG PO TABS: 1.0000 | ORAL_TABLET | Freq: Four times a day (QID) | ORAL | Status: DC | PRN

## 2017-03-17 MED ORDER — NALOXONE HCL 4 MG/0.1ML NA LIQD: 1.0000 | Freq: Once | NASAL | Status: DC

## 2017-03-17 NOTE — Progress Notes (Addendum)
ANTICOAGULATION CONSULT NOTE   Pharmacy Consult for Heparin  Indication: h/o atrial thrombus   Allergies  Allergen Reactions  . Fluoxetine Other (See Comments)    Pt told not to take with eliquis  . Ceftriaxone Itching and Other (See Comments)    Red man syndrome  . Gabapentin Other (See Comments)    Confusion/ chest pains  . Morphine And Related Itching and Nausea And Vomiting  . Nsaids Other (See Comments)    Pt told by hematologist not to take  . Topamax [Topiramate] Other (See Comments)    Dizziness, black out spells  . Toradol [Ketorolac Tromethamine] Itching and Other (See Comments)    Felt hot  . Tramadol     Flushed   . Vicodin [Hydrocodone-Acetaminophen] Other (See Comments)    hallucinations  . Depakote [Divalproex Sodium] Rash and Other (See Comments)    Skin felt like it was on fire  . Methocarbamol Rash  . Metoclopramide Rash and Hives   Patient Measurements: Height:  (160 cm) Weight: 228 lb (103.4 kg) IBW/kg (Calculated) : 52.4 Heparin Dosing Weight: 76 kg  Vital Signs: Temp: 98.6 F (37 C) (04/09 1957) Temp Source: Oral (04/09 1957) BP: 124/51 (04/09 1957) Pulse Rate: 103 (04/09 1957)  Labs:  Recent Labs  03/16/17 2138 03/16/17 2154 03/17/17 0554 03/17/17 1237 03/17/17 1905  HGB 14.8 16.0*  --   --   --   HCT 43.7 47.0*  --   --   --   PLT 402*  --   --   --   --   APTT 26  --   --   --   --   LABPROT 12.9  --   --   --   --   INR 0.97  --   --   --   --   HEPARINUNFRC  --   --   --  0.47 0.27*  CREATININE 0.73 0.70  --   --   --   TROPONINI  --   --  <0.03 <0.03  --    Estimated Creatinine Clearance: 121.4 mL/min (by C-G formula based on SCr of 0.7 mg/dL).  Assessment: 26 y.o. F presents with CP, numbness. She was previously on apixaban for R atrial thrombus seen on TEE 07/2016 but pt has not been taking for at least 2 months. She was started on IV heparin. Heparin level subtherapeutic after being therapeutic earlier today with same  dose. CBC WNL overnight. No infusion issues or bleeding per RN.  Heparin level subtherapeutic: 0.27  Goal of Therapy:  Heparin level 0.3-0.7 units/ml Monitor platelets by anticoagulation protocol: Yes   Plan:  Increase heparin to 1400 units/hr (~18 units/kr/hr) Check a 6 hr heparin level to confirm Daily heparin level and CBC  Ruben Im, PharmD Clinical Pharmacist Pager: [phone] 03/17/2017 8:09 PM

## 2017-03-17 NOTE — Care Management Note (Signed)
Case Management Note  Patient Details  Name: [name] MRN: [id number] Date of Birth: January 04, 1991  Subjective/Objective:                  From home. 26 yo female with a prior history of right atrial thrombosis s/p surgical intervention, clotting disorder, TIA, CAD, asthma, PTSD, depression and tobacco abuse, who presents to the ED complaining of numbness and weakness to her right leg, with inability to apply weight to her leg, in addition to RUE weakness and numbness.  Action/Plan: Admit status INPATIENT (Right-sided weakness); anticipate discharge HOME WITH SELF CARE, MEDICATION ASSISTANCE.   Expected Discharge Date:                  Expected Discharge Plan:  Home/Self Care  In-House Referral:     Discharge planning Services  CM Consult, MATCH Program  Post Acute Care Choice:    Choice offered to:     DME Arranged:    DME Agency:     HH Arranged:    HH Agency:     Status of Service:  In process, will continue to follow  If discussed at Long Length of Stay Meetings, dates discussed:    Additional Comments: Uninsured pt on Eliquis.  Will need medication assistance. Oletta Cohn, RN 03/17/2017, 9:34 AM

## 2017-03-17 NOTE — Progress Notes (Signed)
*  PRELIMINARY RESULTS* Vascular Ultrasound Carotid Duplex (Doppler) has been completed.  There is no obvious evidence of hemodynamically significant internal carotid artery stenosis bilaterally. Vertebral arteries are patent with antegrade flow.   Bilateral lower extremity venous duplex completed. Bilateral lower extremities are negative for deep vein thrombosis. There is no evidence of Baker's cyst bilaterally.  03/17/2017 4:25 PM Gertie Fey, BS, RVT, RDCS, RDMS

## 2017-03-17 NOTE — ED Notes (Signed)
Pt requesting pain medication for her legs 10/10. PRN not loaded in pyxis, requested to be sent from pharmacy. Pt aware.

## 2017-03-17 NOTE — Progress Notes (Signed)
patient seen and examined  26 y.o. female with medical history significant of hyperlipidemia, asthma, depression, PTSD, clotting disorder, TIA, CAD, thrombosis of right atrium, medication noncompliance to Eliquis, who presents with numbness and weakness to her right leg, with inability to apply weight to her leg, in addition to RUE weakness and numbness.  and chest pain and shortness of breath. She had been started on Eliquis in 2014 after surgical removal of the right atrial thrombus, but then stopped it in 2016 due to change of insurance and financial issues. She presented again to [facility] in late July 2017 with symptoms similar to today's presentation. TEE obtained during that admission on 07/09/16 showed recurrence of right atrial thrombus: The atrium was mildly dilated. There was an apparent, small, irregular, highly mobile thrombus in the atrialcavity and appendage. There was an apparent, medium-sized fixed thrombus in the lateral atrial cavity  Assessment and plan Right-sided weakness CT head shows no intracranial abnormality.  MRI brain shows no acute intracranial abnormality. Stable nonspecific foci of T2 FLAIR hyperintensity within subcortical white matter of the frontal lobes are noted, possibly related to migraine headaches or vasculopathy; the hyperintensities are minimal to mild in extent.  Normal MRA of the head without contrast. Normal MRV of the head with and without contrast. EKG EKG reveals sinus tachycardia. No atrial fibrillation. CT angiogram of chest is negative for PE or dissection. Right-sided weakness TTE to evaluate for progression versus resolution of right atrial thrombus. If negative, obtain TEE.  Would restart therapeutic anticoagulation.-patient currently on a heparin drip   Frequent neuro checks Carotid ultrasound Case management consult regarding insurance and inability to afford Eliquis prescription.     Chest pain: Likely musculoskeletal.  CT angiogram of  chest is negative for PE or dissection. Will need to rule out mural thrombosis and ACS. - cycle CE q6 x3 and repeat EKG in the am  - prn percocet for pain - 2d echo-->If negative, obtain TEE.  - will get LE doppler to r/o DVT due to right calf pain  notified Dr Algie Coffer, TEE in am    HLD: Last LDL was 113 on 07/08/16, goal should be <70. Was on Lipitor, but not taking it. -Check FLP  Depression: Stable, no suicidal or homicidal ideations. -Continue home medications: Vilazodone  UTI (urinary tract infection):  doubt patient has UTI. She does not have symptoms of UTI. Will discontinue -IV aztreonam    Dysphagia Patient complains of difficulty swallowing Will order a barium esophagogram, speech therapy evaluation GI consultation of barium esophagogram is abnormal

## 2017-03-17 NOTE — ED Notes (Signed)
Pt awake and asking for pain and nausea medicine. Pt offered percocet and zofran as ordered. States she is unable to swallow. Reviewed chart and commented that she did pass her swallow screen. Pt states she can not swallow a cracker. Pt speaking in full complete sentences. States zofran doesn't work for her and she needs phenergan. Informed pt I would ask admitting MD for orders.

## 2017-03-17 NOTE — ED Notes (Signed)
Pt drinking own "gatorade" from home. States she would like a soft diet. States she can swallow without difficulty. No speech deficits. No coughing with drinking. Swallow screen passed again.

## 2017-03-17 NOTE — ED Notes (Signed)
Pt states she can take nothing solid by mouth. Visual assessment of throat wnl. resp e/u.States she can take liquid and soft food "like pudding". Continues to request IV pain meds and phenergan. Dr Algie Coffer declines to order wanting primary team to decide.

## 2017-03-17 NOTE — Progress Notes (Signed)
STROKE TEAM PROGRESS NOTE   HISTORY OF PRESENT ILLNESS (per record) [name] is an 26 y.o. female with a prior history of right atrial thrombosis s/p surgical intervention, clotting disorder, TIA, CAD, asthma, PTSD, depression and tobacco abuse, who presents to the ED complaining of numbness and weakness to her right leg, with inability to apply weight to her leg, in addition to RUE weakness and numbness. LKN was yesterday. She denied headache or dizziness in triage and was NAD. After being moved to ED room 36, the patient stated that she was feeling a sensation of chest heaviness. She was noted to be tachypneic and tachycardic. BP was 123/50. A STAT CT chest showed no acute cardiopulmonary disease, no large central pulmonary embolus and no aortic dissection or aneurysm.Marland Kitchen    She had been started on Eliquis in 2014 after surgical removal of the right atrial thrombus, but then stopped it in 2016 due to change of insurance and financial issues. She presented again to [facility] in late July 2017 with symptoms similar to today's presentation. TEE obtained during that admission on 07/09/16 showed recurrence of right atrial thrombus: The atrium was mildly dilated. There was an apparent, small, irregular, highly mobile thrombus in the atrialcavity and appendage. There was an apparent, medium-sized fixed thrombus in the lateral atrial cavity. She has continued to be off anticoagulation due to financial and insurance issues.  Home medications include atorvastatin, phenergan and ocycodone/acetaminophen.   She was LKW 03/15/2017. Patient was not administered IV t-PA secondary to being outside the stroke window. She was admitted for further evaluation and treatment.   SUBJECTIVE (INTERVAL HISTORY) No family at bedside. She stated that she feel better. She walked with PT in hallway using walker, still complains of right leg weakness but no more numbness. Denies other limb weakness. She complains of right sided HA and  stated that her migraine is coming and asking for pain medication such as percocet or migraine cocktail. She is requesting high dose of oxycodone and saying 7.5mg  is not enough and she usually takes  at least.    OBJECTIVE Temp:  [98 F (36.7 C)-98.6 F (37 C)] 98.3 F (36.8 C) (04/10 0813) Pulse Rate:  [88-103] 89 (04/10 0813) Cardiac Rhythm: Sinus tachycardia;Normal sinus rhythm (04/10 0900) Resp:  [14-24] 17 (04/10 0725) BP: (95-124)/(35-62) 95/35 (04/10 0813) SpO2:  [96 %-99 %] 99 % (04/10 0813) Weight:  [103.4 kg (228 lb)-105.6 kg (232 lb 12.9 oz)] 105.6 kg (232 lb 12.9 oz) (04/10 0725)  CBC:   Recent Labs Lab 03/16/17 2138 03/16/17 2154 03/18/17 0406  WBC 9.4  --  6.9  NEUTROABS 5.0  --   --   HGB 14.8 16.0* 13.3  HCT 43.7 47.0* 41.2  MCV 85.5  --  86.7  PLT 402*  --  318    Basic Metabolic Panel:   Recent Labs Lab 03/16/17 2138 03/16/17 2154 03/18/17 0406  NA 133* 135 138  K 3.7 3.6 4.4  CL 104 105 106  CO2 18*  --  23  GLUCOSE 96 100* 94  BUN 11 13 5*  CREATININE 0.73 0.70 0.69  CALCIUM 8.8*  --  8.8*    Lipid Panel:     Component Value Date/Time   CHOL 176 03/18/2017 0406   TRIG 239 (H) 03/18/2017 0406   HDL 36 (L) 03/18/2017 0406   CHOLHDL 4.9 03/18/2017 0406   VLDL 48 (H) 03/18/2017 0406   LDLCALC 92 03/18/2017 0406   HgbA1c:  Lab Results  Component  Value Date   HGBA1C 5.5 07/08/2016   Urine Drug Screen:     Component Value Date/Time   LABOPIA NONE DETECTED 03/16/2017 2235   COCAINSCRNUR NONE DETECTED 03/16/2017 2235   LABBENZ NONE DETECTED 03/16/2017 2235   AMPHETMU NONE DETECTED 03/16/2017 2235   THCU NONE DETECTED 03/16/2017 2235   LABBARB NONE DETECTED 03/16/2017 2235    Alcohol Level     Component Value Date/Time   ETH <5 12/28/2016 0240    IMAGING I have personally reviewed the radiological images below and agree with the radiology interpretations.  Ct Head Wo Contrast 03/16/2017 No acute intracranial findings.  Minimal chronic inflammatory change right maxillary sinus.   Ct Angio Chest Pe W And/or Wo Contrast 03/17/2017 1. No acute cardiopulmonary disease. 2. No large central pulmonary embolus. 3. No aortic dissection or aneurysm.   Mr Brain Wo Contrast 03/17/2017 1. No acute intracranial abnormality identified. 2. Stable nonspecific foci of T2 FLAIR hyperintensity within subcortical white matter of the frontal lobes, possibly related to migraine headaches or vasculopathy. Atypical distribution for demyelination.   Mr Maxine Glenn Head Wo Contrast 03/17/2017 3. Normal MRA of the head without contrast.   Mr Mrv Head W Wo Cm 03/17/2017 4. Normal MRV of the head with and without contrast.   Carotid Doppler   Study was technically difficult due to patient body habitus, depth of vessels, and high bifurcation. There is no obvious evidence of hemodynamically significant internal carotid artery stenosis bilaterally. Vertebral arteries are patent with antegrade flow.  LE doppler - No evidence of deep vein thrombosis involving the right lower  extremity and left lower extremity. - No evidence of Baker&'s cyst on the right or left.  2D Echocardiogram  - Left ventricle: The cavity size was normal. Systolic function was vigorous. The estimated ejection fraction was in the range of 65% to 70%. Wall motion was normal; there were no regional wall motion abnormalities. Doppler parameters are consistent with abnormal left ventricular relaxation (grade 1 diastolic dysfunction). - Left atrium: Cannot exclude thrombus. - Right atrium: Cannot exclude thrombus.  TEE pending   PHYSICAL EXAM  Temp:  [97.9 F (36.6 C)-98.6 F (37 C)] 97.9 F (36.6 C) (04/10 1651) Pulse Rate:  [88-103] 98 (04/10 1651) Resp:  [14-24] 17 (04/10 0725) BP: (95-124)/(35-62) 104/50 (04/10 1651) SpO2:  [96 %-99 %] 99 % (04/10 1651) Weight:  [228 lb (103.4 kg)-232 lb 12.9 oz (105.6 kg)] 232 lb 12.9 oz (105.6 kg) (04/10 0725)  General - morbid  obesity, well developed, in mild distress complaining of HA.  Ophthalmologic - Fundi not visualized due to noncooperative.  Cardiovascular - Regular rate and rhythm.  Mental Status -  Level of arousal and orientation to time, place, and person were intact. Language including expression, naming, repetition, comprehension was assessed and found intact.  Cranial Nerves II - XII - II - Visual field intact OU. III, IV, VI - Extraocular movements intact. V - Facial sensation intact bilaterally. VII - Facial movement intact bilaterally. VIII - Hearing & vestibular intact bilaterally. X - Palate elevates symmetrically. XI - Chin turning & shoulder shrug intact bilaterally. XII - Tongue protrusion intact.  Motor Strength - The patient's strength showed giveaway weakness on the right UE and right LE. Right UE demonstrated drift but no pronation and drift can be prevent with distraction. RLE 0/5 but hoover sign positive.  Bulk was normal and fasciculations were absent.   Motor Tone - Muscle tone was assessed at the neck and appendages and was  normal.  Reflexes - The patient's reflexes were 1+ in all extremities and she had no pathological reflexes.  Sensory - Light touch, temperature/pinprick subjectively decreased on the right.    Coordination - The patient had normal movements in the hands with no ataxia or dysmetria. However, slow on right arm movement.  Gait and Station - not tested. But by report, she was able to walk with PT in home and hallway but dragging at RLE   ASSESSMENT/PLAN [name] is a 26 y.o. female with history of right atrial thrombosis s/p surgical intervention, clotting disorder, TIA, CAD, asthma, PTSD, depression, morbid obesity and tobacco abuse presenting with R arm and leg weakness and sensory numbness. She did not receive IV t-PA due to being outside the window.   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 R sinus dz  CTA chest no PE, aortic dissection/aneurysm, CP dz  MRI  No acute stroke.  MRA  normal  MRV normal  Carotid Doppler unremarkable  2D Echo  EF 65-70%. No source of embolus   LE doppler negative   TEE pending  LDL 92   HgbA1c pending  IV heparin for VTE prophylaxis Diet NPO time specified Diet Heart Room service appropriate? Yes; Fluid consistency: Thin  Eliquis (apixaban) daily prior to admission, put on heparin IV. Switched to eliquis this am. However, pt can not afford eliquis and she is applying for financial assistance. She also can not follow up with INR monitoring if she is to put on coumadin.  Patient counseled to be compliant with her antithrombotic medications  Ongoing aggressive stroke risk factor management  Therapy recommendations:  Home PT  Disposition:  pending   Clotting disorder  2012 left LE DVT  2014 R atrial thrombus s/p thrombectomy at [facility] and not compliant with warfarin anticoagulation afterwards.   06/2016 small right atrium thrombus on TEE -> eliquis -> stop by self due to financial difficulty  Now on heparin IV and now transitioned to eliquis.  Will check limited hypercoag labs due to on [facility]  TEE pending  pt can not afford eliquis and she is applying for financial assistance. She also can not follow up with INR monitoring if she is to put on coumadin.  Hyperlipidemia  Home meds:  Lipitor 10, resumed in hospital  LDL 92, goal < 70  Continue statin at discharge  Tobacco abuse  Current smoker  Smoking cessation counseling provided  Nicotine patch provided  Pt is willing to quit  Other Stroke Risk Factors  Morbid Obesity, Body mass index is 41.24 kg/m., recommend weight loss, diet and exercise as appropriate   Other Active Problems  Chest pain, musculoskeletal  PTSD  Depression  Dysphagia   Hospital day # 1  Marvel Plan, MD PhD Stroke Neurology 03/18/2017 5:21 PM   To contact  Stroke Continuity provider, please refer to [email or web address]. After hours, contact General Neurology

## 2017-03-17 NOTE — ED Notes (Signed)
Dr Algie Coffer states pt will have TEE tomorrow if echo and carotid are neg. States he feels her cp is muscular and percocet will be fine for meds

## 2017-03-17 NOTE — ED Notes (Signed)
Dr Algie Coffer in dept reviewing chart. Spoke with him about pain meds and nausea meds. He will eval pt

## 2017-03-17 NOTE — ED Notes (Signed)
Returned from echo. Monitor replaced. Pt appears in nad. No complaints at this time

## 2017-03-17 NOTE — Consult Note (Signed)
Referring Physician:  Eulalia Ellerman is an 26 y.o. female.                       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 She has right sided mid axillary and between 5th and 6th rib area sharp chest pain radiating to right shoulder as before. Troponin-I was normal and EKG showed sinus tachycardia.  Past Medical History:  Diagnosis Date  . Asthma   . Coronary artery disease    clotting disorder  . Depression   . PTSD (post-traumatic stress disorder)   . Thrombosis of right atrium   . Tobacco abuse       Past Surgical History:  Procedure Laterality Date  . CARDIAC SURGERY    . CHOLECYSTECTOMY    . TEE WITHOUT CARDIOVERSION N/A 07/09/2016   Procedure: TRANSESOPHAGEAL ECHOCARDIOGRAM (TEE);  Surgeon: Dixie Dials, MD;  Location: [facility] ENDOSCOPY;  Service: Cardiovascular;  Laterality: N/A;    Family History  Problem Relation Age of Onset  . Bipolar disorder Mother   . Glaucoma Mother   . Arthritis Father    Social History:  reports that she quit smoking about 3 months ago. Her smoking use included Cigarettes. She has never used smokeless tobacco. She reports that she does not drink alcohol or use drugs.  Allergies:  Allergies  Allergen Reactions  . Fluoxetine Other (See Comments)    Pt told not to take with eliquis  . Ceftriaxone Itching and Other (See Comments)    Red man syndrome  . Gabapentin Other (See Comments)    Confusion/ chest pains  . Morphine And Related Itching and Nausea And Vomiting  . Nsaids Other (See Comments)    Pt told by hematologist not to take  . Topamax [Topiramate] Other (See Comments)    Dizziness, black out spells  . Toradol [Ketorolac Tromethamine] Itching and Other (See Comments)    Felt hot  . Tramadol     Flushed   . Vicodin  [Hydrocodone-Acetaminophen] Other (See Comments)    hallucinations  . Depakote [Divalproex Sodium] Rash and Other (See Comments)    Skin felt like it was on fire  . Methocarbamol Rash  . Metoclopramide Rash and Hives     (Not in a hospital admission)  Results for orders placed or performed during the hospital encounter of 03/16/17 (from the past 48 hour(s))  Protime-INR     Status: None   Collection Time: 03/16/17  9:38 PM  Result Value Ref Range   Prothrombin Time 12.9 11.4 - 15.2 seconds   INR 0.97   APTT     Status: None   Collection Time: 03/16/17  9:38 PM  Result Value Ref Range   aPTT 26 24 - 36 seconds  CBC     Status: Abnormal   Collection Time: 03/16/17  9:38 PM  Result Value Ref Range   WBC 9.4 4.0 - 10.5 K/uL   RBC 5.11 3.87 - 5.11 MIL/uL   Hemoglobin 14.8 12.0 - 15.0 g/dL   HCT 43.7 36.0 - 46.0 %   MCV 85.5 78.0 - 100.0 fL   MCH 29.0 26.0 - 34.0 pg   MCHC 33.9 30.0 - 36.0 g/dL   RDW 13.5 11.5 - 15.5 %   Platelets 402 (H) 150 -  400 K/uL  Differential     Status: None   Collection Time: 03/16/17  9:38 PM  Result Value Ref Range   Neutrophils Relative % 54 %   Neutro Abs 5.0 1.7 - 7.7 K/uL   Lymphocytes Relative 38 %   Lymphs Abs 3.6 0.7 - 4.0 K/uL   Monocytes Relative 7 %   Monocytes Absolute 0.7 0.1 - 1.0 K/uL   Eosinophils Relative 1 %   Eosinophils Absolute 0.1 0.0 - 0.7 K/uL   Basophils Relative 0 %   Basophils Absolute 0.0 0.0 - 0.1 K/uL  Comprehensive metabolic panel     Status: Abnormal   Collection Time: 03/16/17  9:38 PM  Result Value Ref Range   Sodium 133 (L) 135 - 145 mmol/L   Potassium 3.7 3.5 - 5.1 mmol/L   Chloride 104 101 - 111 mmol/L   CO2 18 (L) 22 - 32 mmol/L   Glucose, Bld 96 65 - 99 mg/dL   BUN 11 6 - 20 mg/dL   Creatinine, Ser 0.73 0.44 - 1.00 mg/dL   Calcium 8.8 (L) 8.9 - 10.3 mg/dL   Total Protein 7.8 6.5 - 8.1 g/dL   Albumin 3.4 (L) 3.5 - 5.0 g/dL   AST 17 15 - 41 U/L   ALT 9 (L) 14 - 54 U/L   Alkaline Phosphatase 55 38 -  126 U/L   Total Bilirubin 0.3 0.3 - 1.2 mg/dL   GFR calc non Af Amer >60 >60 mL/min   GFR calc Af Amer >60 >60 mL/min    Comment: (NOTE) The eGFR has been calculated using the CKD EPI equation. This calculation has not been validated in all clinical situations. eGFR's persistently <60 mL/min signify possible Chronic Kidney Disease.    Anion gap 11 5 - 15  I-stat troponin, ED     Status: None   Collection Time: 03/16/17  9:52 PM  Result Value Ref Range   Troponin i, poc 0.00 0.00 - 0.08 ng/mL   Comment 3            Comment: Due to the release kinetics of cTnI, a negative result within the first hours of the onset of symptoms does not rule out myocardial infarction with certainty. If myocardial infarction is still suspected, repeat the test at appropriate intervals.   I-Stat Chem 8, ED     Status: Abnormal   Collection Time: 03/16/17  9:54 PM  Result Value Ref Range   Sodium 135 135 - 145 mmol/L   Potassium 3.6 3.5 - 5.1 mmol/L   Chloride 105 101 - 111 mmol/L   BUN 13 6 - 20 mg/dL   Creatinine, Ser 0.70 0.44 - 1.00 mg/dL   Glucose, Bld 100 (H) 65 - 99 mg/dL   Calcium, Ion 0.94 (L) 1.15 - 1.40 mmol/L   TCO2 19 0 - 100 mmol/L   Hemoglobin 16.0 (H) 12.0 - 15.0 g/dL   HCT 47.0 (H) 36.0 - 46.0 %  Urinalysis, Routine w reflex microscopic     Status: Abnormal   Collection Time: 03/16/17 10:35 PM  Result Value Ref Range   Color, Urine YELLOW YELLOW   APPearance HAZY (A) CLEAR   Specific Gravity, Urine 1.014 1.005 - 1.030   pH 5.0 5.0 - 8.0   Glucose, UA NEGATIVE NEGATIVE mg/dL   Hgb urine dipstick NEGATIVE NEGATIVE   Bilirubin Urine NEGATIVE NEGATIVE   Ketones, ur NEGATIVE NEGATIVE mg/dL   Protein, ur NEGATIVE NEGATIVE mg/dL   Nitrite NEGATIVE NEGATIVE  Leukocytes, UA LARGE (A) NEGATIVE   RBC / HPF 0-5 0 - 5 RBC/hpf   WBC, UA 6-30 0 - 5 WBC/hpf   Bacteria, UA FEW (A) NONE SEEN   Squamous Epithelial / LPF 0-5 (A) NONE SEEN   Mucous PRESENT   Pregnancy, urine     Status:  None   Collection Time: 03/16/17 10:35 PM  Result Value Ref Range   Preg Test, Ur NEGATIVE NEGATIVE    Comment:        THE SENSITIVITY OF THIS METHODOLOGY IS >20 mIU/mL.   CBG monitoring, ED     Status: Abnormal   Collection Time: 03/16/17 10:36 PM  Result Value Ref Range   Glucose-Capillary 106 (H) 65 - 99 mg/dL  Brain natriuretic peptide     Status: None   Collection Time: 03/17/17  5:54 AM  Result Value Ref Range   B Natriuretic Peptide 9.3 0.0 - 100.0 pg/mL  Troponin I (q 6hr x 3)     Status: None   Collection Time: 03/17/17  5:54 AM  Result Value Ref Range   Troponin I <0.03 <0.03 ng/mL  Lipase, blood     Status: None   Collection Time: 03/17/17  5:54 AM  Result Value Ref Range   Lipase 15 11 - 51 U/L   Ct Head Wo Contrast  Result Date: 03/16/2017 CLINICAL DATA:  Right leg and arm weakness and numbness. EXAM: CT HEAD WITHOUT CONTRAST TECHNIQUE: Contiguous axial images were obtained from the base of the skull through the vertex without intravenous contrast. COMPARISON:  12/28/2016 and 07/07/2016 FINDINGS: Brain: Ventricles, cisterns and other CSF spaces are within normal. There is no mass, mass effect, shift of midline structures or acute hemorrhage. No evidence of acute infarction. Vascular: Normal. Skull: Normal. Sinuses/Orbits: The orbits are normal. Subtle focal opacification over the lateral wall the right maxillary sinus. Other: None. IMPRESSION: No acute intracranial findings. Minimal chronic inflammatory change right maxillary sinus. Electronically Signed   By: Marin Olp M.D.   On: 03/16/2017 22:17   Ct Angio Chest Pe W And/or Wo Contrast  Result Date: 03/17/2017 CLINICAL DATA:  Right leg paresthesia and weakness EXAM: CT ANGIOGRAPHY CHEST WITH CONTRAST TECHNIQUE: Multidetector CT imaging of the chest was performed using the standard protocol during bolus administration of intravenous contrast. Multiplanar CT image reconstructions and MIPs were obtained to evaluate the  vascular anatomy. CONTRAST:  100 cc Isovue 370 IV COMPARISON:  12/23/2016 CT FINDINGS: Cardiovascular: Satisfactory opacification of the pulmonary arteries to the proximal segmental level given patient body habitus. No evidence of pulmonary embolism. Normal heart size. No pericardial effusion. Mediastinum/Nodes: No enlarged mediastinal, hilar, or axillary lymph nodes. Thyroid gland, trachea, and esophagus demonstrate no significant findings. Lungs/Pleura: Lungs are clear. There is dependent atelectasis bilaterally. No pleural effusion or pneumothorax. Upper Abdomen: No acute abnormality. Musculoskeletal: No chest wall abnormality. No acute or significant osseous findings. Review of the MIP images confirms the above findings. IMPRESSION: 1. No acute cardiopulmonary disease. 2. No large central pulmonary embolus. 3. No aortic dissection or aneurysm. Electronically Signed   By: Ashley Royalty M.D.   On: 03/17/2017 01:34   Mr Jodene Nam Head Wo Contrast  Result Date: 03/17/2017 CLINICAL DATA:  26 y/o  F; right arm and leg weakness. EXAM: MRI HEAD WITHOUT CONTRAST MRA HEAD WITHOUT CONTRAST MRV HEAD WITHOUT AND WITH CONTRAST TECHNIQUE: Multiplanar, multiecho pulse sequences of the brain and surrounding structures were obtained without intravenous contrast. Angiographic images of the head were obtained using MRA  technique without contrast. Angiographic images were obtained of the head with MRV technique with and without intravenous contrast. COMPARISON:  03/16/2017 CT of the head. 12/28/2016 MRI and MRA of the head. CONTRAST:  20 cc MultiHance FINDINGS: MRI HEAD FINDINGS Brain: No acute infarction, hemorrhage, hydrocephalus, extra-axial collection or mass lesion. Stable few nonspecific foci of T2 FLAIR hyperintensity are present within subcortical white matter of the frontal lobes bilaterally. No white matter lesions identified within splenium of corpus callosum, periventricular white matter, brainstem, basal ganglia, or  cerebellum. Vascular: As below. Skull and upper cervical spine: Normal marrow signal. Sinuses/Orbits: Small right maxillary sinus mucous retention cyst. Otherwise no abnormal signal of the visualized paranasal sinuses or mastoid air cells. Other: None. MRA HEAD FINDINGS Internal carotid arteries:  Patent. Anterior cerebral arteries:  Patent. Middle cerebral arteries: Patent. Anterior communicating artery: Patent. Posterior communicating arteries: Probable diminutive posterior communicating arteries bilaterally. Posterior cerebral arteries:  Patent. Basilar artery:  Patent. Vertebral arteries:  Patent. No evidence of high-grade stenosis, large vessel occlusion, or aneurysm unless noted above. MRV HEAD FINDINGS Normal flow related signal on time-of-flight MRV of the superior sagittal sinus, straight sinus, internal cerebral veins, basal veins of Rosenthal, bilateral transverse sinuses, bilateral sigmoid sinuses, bilateral upper internal jugular veins, and large cortical veins. After administration of intravenous contrast there is normal enhancement of the dural venous sinuses as above. IMPRESSION: 1. No acute intracranial abnormality identified. 2. Stable nonspecific foci of T2 FLAIR hyperintensity within subcortical white matter of the frontal lobes, possibly related to migraine headaches or vasculopathy. Atypical distribution for demyelination. 3. Normal MRA of the head without contrast. 4. Normal MRV of the head with and without contrast. Electronically Signed   By: Kristine Garbe M.D.   On: 03/17/2017 03:13   Mr Brain Wo Contrast  Result Date: 03/17/2017 CLINICAL DATA:  26 y/o  F; right arm and leg weakness. EXAM: MRI HEAD WITHOUT CONTRAST MRA HEAD WITHOUT CONTRAST MRV HEAD WITHOUT AND WITH CONTRAST TECHNIQUE: Multiplanar, multiecho pulse sequences of the brain and surrounding structures were obtained without intravenous contrast. Angiographic images of the head were obtained using MRA technique  without contrast. Angiographic images were obtained of the head with MRV technique with and without intravenous contrast. COMPARISON:  03/16/2017 CT of the head. 12/28/2016 MRI and MRA of the head. CONTRAST:  20 cc MultiHance FINDINGS: MRI HEAD FINDINGS Brain: No acute infarction, hemorrhage, hydrocephalus, extra-axial collection or mass lesion. Stable few nonspecific foci of T2 FLAIR hyperintensity are present within subcortical white matter of the frontal lobes bilaterally. No white matter lesions identified within splenium of corpus callosum, periventricular white matter, brainstem, basal ganglia, or cerebellum. Vascular: As below. Skull and upper cervical spine: Normal marrow signal. Sinuses/Orbits: Small right maxillary sinus mucous retention cyst. Otherwise no abnormal signal of the visualized paranasal sinuses or mastoid air cells. Other: None. MRA HEAD FINDINGS Internal carotid arteries:  Patent. Anterior cerebral arteries:  Patent. Middle cerebral arteries: Patent. Anterior communicating artery: Patent. Posterior communicating arteries: Probable diminutive posterior communicating arteries bilaterally. Posterior cerebral arteries:  Patent. Basilar artery:  Patent. Vertebral arteries:  Patent. No evidence of high-grade stenosis, large vessel occlusion, or aneurysm unless noted above. MRV HEAD FINDINGS Normal flow related signal on time-of-flight MRV of the superior sagittal sinus, straight sinus, internal cerebral veins, basal veins of Rosenthal, bilateral transverse sinuses, bilateral sigmoid sinuses, bilateral upper internal jugular veins, and large cortical veins. After administration of intravenous contrast there is normal enhancement of the dural venous sinuses as above. IMPRESSION: 1.  No acute intracranial abnormality identified. 2. Stable nonspecific foci of T2 FLAIR hyperintensity within subcortical white matter of the frontal lobes, possibly related to migraine headaches or vasculopathy. Atypical  distribution for demyelination. 3. Normal MRA of the head without contrast. 4. Normal MRV of the head with and without contrast. Electronically Signed   By: Kristine Garbe M.D.   On: 03/17/2017 03:13   Mr Mrv Miguel Dibble VH Cm  Result Date: 03/17/2017 CLINICAL DATA:  26 y/o  F; right arm and leg weakness. EXAM: MRI HEAD WITHOUT CONTRAST MRA HEAD WITHOUT CONTRAST MRV HEAD WITHOUT AND WITH CONTRAST TECHNIQUE: Multiplanar, multiecho pulse sequences of the brain and surrounding structures were obtained without intravenous contrast. Angiographic images of the head were obtained using MRA technique without contrast. Angiographic images were obtained of the head with MRV technique with and without intravenous contrast. COMPARISON:  03/16/2017 CT of the head. 12/28/2016 MRI and MRA of the head. CONTRAST:  20 cc MultiHance FINDINGS: MRI HEAD FINDINGS Brain: No acute infarction, hemorrhage, hydrocephalus, extra-axial collection or mass lesion. Stable few nonspecific foci of T2 FLAIR hyperintensity are present within subcortical white matter of the frontal lobes bilaterally. No white matter lesions identified within splenium of corpus callosum, periventricular white matter, brainstem, basal ganglia, or cerebellum. Vascular: As below. Skull and upper cervical spine: Normal marrow signal. Sinuses/Orbits: Small right maxillary sinus mucous retention cyst. Otherwise no abnormal signal of the visualized paranasal sinuses or mastoid air cells. Other: None. MRA HEAD FINDINGS Internal carotid arteries:  Patent. Anterior cerebral arteries:  Patent. Middle cerebral arteries: Patent. Anterior communicating artery: Patent. Posterior communicating arteries: Probable diminutive posterior communicating arteries bilaterally. Posterior cerebral arteries:  Patent. Basilar artery:  Patent. Vertebral arteries:  Patent. No evidence of high-grade stenosis, large vessel occlusion, or aneurysm unless noted above. MRV HEAD FINDINGS Normal flow  related signal on time-of-flight MRV of the superior sagittal sinus, straight sinus, internal cerebral veins, basal veins of Rosenthal, bilateral transverse sinuses, bilateral sigmoid sinuses, bilateral upper internal jugular veins, and large cortical veins. After administration of intravenous contrast there is normal enhancement of the dural venous sinuses as above. IMPRESSION: 1. No acute intracranial abnormality identified. 2. Stable nonspecific foci of T2 FLAIR hyperintensity within subcortical white matter of the frontal lobes, possibly related to migraine headaches or vasculopathy. Atypical distribution for demyelination. 3. Normal MRA of the head without contrast. 4. Normal MRV of the head with and without contrast. Electronically Signed   By: Kristine Garbe M.D.   On: 03/17/2017 03:13    Review Of Systems Constitutional: No fever, chills, weight loss or gain. Eyes: No vision change, wears glasses. No discharge or pain. Ears: No hearing loss, No tinnitus. Respiratory: No asthma, COPD, pneumonias. Positive shortness of breath. No hemoptysis. Cardiovascular: Positive chest pain, palpitation, leg edema. Gastrointestinal: No nausea, vomiting, diarrhea, constipation. No GI bleed. No hepatitis. Genitourinary: No dysuria, hematuria, kidney stone. No incontinance. Neurological: No headache, Positive stroke in past with right sided weakness, no seizures.  Psychiatry: No psych facility admission for anxiety, depression, suicide. No detox. Skin: No rash. Musculoskeletal: No joint pain, fibromyalgia. No neck pain, back pain. Lymphadenopathy: No lymphadenopathy. Hematology: No anemia or easy bruising.   Blood pressure (!) 108/53, pulse (!) 110, temperature 98.1 F (36.7 C), resp. rate 19, height _0  (1.6 m), weight 103.4 kg (228 lb), SpO2 97 %. Body mass index is 40.39 kg/m. General appearance: alert, cooperative, appears stated age and no distress Head: Normocephalic, atraumatic. Eyes:  Brown eyes, pink conjunctiva, corneas  clear. PERRL, EOM's intact. Neck: No adenopathy, no carotid bruit, no JVD, supple, symmetrical, trachea midline and thyroid not enlarged. Resp: Clear to auscultation bilaterally. Cardio: Regular rate and rhythm, S1, S2 normal, II/VI systolic murmur, no click, rub or gallop GI: Soft, non-tender; bowel sounds normal; no organomegaly. Extremities: Trace edema, no cyanosis or clubbing. Skin: Warm and dry.  Neurologic: Alert and oriented X 3, Right lower leg weakness as before. Holds right knee in flexed position.   Assessment/Plan Chest pain, musculoskeletal H/O right atrial thrombosis PTSD Tobacco use disorder H/O stroke  TEE in AM.  Birdie Riddle, MD  03/17/2017, 8:42 AM

## 2017-03-17 NOTE — ED Notes (Signed)
Per Dr Susie Cassette, pt can have diet until midnight. Then NPO. Pt complains of anxiety. Med ordered.

## 2017-03-17 NOTE — ED Notes (Signed)
Pt with family at bedside. Requesting po phenergan. Will call md

## 2017-03-17 NOTE — H&P (Addendum)
History and Physical    [name] ZOX:[id number] DOB: [birth year] DOA: 03/16/2017  Referring MD/NP/PA:   PCP: No PCP Per Patient   Patient coming from:  The patient is coming from home.  At baseline, pt is independent for most of ADL.  Chief Complaint: Right-sided weakness, chest pain and shortness of breath  HPI: [name] is a 26 y.o. female with medical history significant of hyperlipidemia, asthma, depression, PTSD, clotting disorder, TIA, CAD, thrombosis of right atrium, medication noncompliance to Eliquis, who presents with right-sided weakness and chest pain and shortness of breath.  Patient states that her right-sided weakness started about 3:00 AM yesterday. She also has a blurry vision bilaterally, but no hearing loss. She could not move her right leg at all. She also has right sided numbness. She states that he was supposed to take Eliquis for thrombosis of T cane, but stopped taking it 2 months ago due to lack of insurance. Patient also reports shortness of breath and a chest pain. The chest is located in the right side of chest, constant, sharp, 9 out of 10 in severity, pruritic. She does not have cough, fever or chills. She reports a right calf tenderness. She has nausea and vomited 3 times, no abdominal pain or diarrhea. Denies symptoms of UTI. Pt states that she quit smoking 6 months ago.  ED Course: pt was found to have WBC 9.4, positive urinalysis with large amount of leukocytes, INR 0.97, electrolytes renal function okay, temperature normal, A section 98% on room air. Pt is admitted to SDU as inpt. Neurology, Dr. Otelia Limes was consulted.  1. CT head shows no intracranial abnormality.  2. MRI brain shows no acute intracranial abnormality. Stable nonspecific foci of T2 FLAIR hyperintensity within subcortical white matter of the frontal lobes are noted, possibly related to migraine headaches or vasculopathy; the hyperintensities are minimal to mild in extent.  3. Normal MRA  of the head without contrast. 4. Normal MRV of the head with and without contrast. 5. EKG reveals sinus tachycardia. No atrial fibrillation. 6. CT angiogram of chest is negative for PE or dissection.   Review of Systems:   General: no fevers, chills, no changes in body weight, has fatigue HEENT: no blurry vision, hearing changes or sore throat Respiratory: no dyspnea, coughing, wheezing CV: no chest pain, no palpitations GI: has nausea, vomiting, no abdominal pain, diarrhea, constipation GU: no dysuria, burning on urination, increased urinary frequency, hematuria  Ext: no leg edema Neuro: has right sided weakness, numbness, or tingling. Has blurry vision. No hearing loss Skin: no rash, no skin tear. MSK: No muscle spasm, no deformity, no limitation of range of movement in spin Heme: No easy bruising.  Travel history: No recent long distant travel.  Allergy:  Allergies  Allergen Reactions  . Fluoxetine Other (See Comments)    Pt told not to take with eliquis  . Ceftriaxone Itching and Other (See Comments)    Red man syndrome  . Gabapentin Other (See Comments)    Confusion/ chest pains  . Morphine And Related Itching and Nausea And Vomiting  . Nsaids Other (See Comments)    Pt told by hematologist not to take  . Topamax [Topiramate] Other (See Comments)    Dizziness, black out spells  . Toradol [Ketorolac Tromethamine] Itching and Other (See Comments)    Felt hot  . Tramadol     Flushed   . Vicodin [Hydrocodone-Acetaminophen] Other (See Comments)    hallucinations  . Depakote [Divalproex Sodium] Rash and  Other (See Comments)    Skin felt like it was on fire  . Methocarbamol Rash  . Metoclopramide Rash and Hives    Past Medical History:  Diagnosis Date  . Asthma   . Coronary artery disease    clotting disorder  . Depression   . PTSD (post-traumatic stress disorder)   . Thrombosis of right atrium   . Tobacco abuse     Past Surgical History:  Procedure Laterality  Date  . CARDIAC SURGERY    . CHOLECYSTECTOMY    . TEE WITHOUT CARDIOVERSION N/A 07/09/2016   Procedure: TRANSESOPHAGEAL ECHOCARDIOGRAM (TEE);  Surgeon: Orpah Cobb, MD;  Location: [facility] ENDOSCOPY;  Service: Cardiovascular;  Laterality: N/A;    Social History:  reports that she quit smoking about 3 months ago. Her smoking use included Cigarettes. She has never used smokeless tobacco. She reports that she does not drink alcohol or use drugs.  Family History:  Family History  Problem Relation Age of Onset  . Bipolar disorder Mother   . Glaucoma Mother   . Arthritis Father      Prior to Admission medications   Medication Sig Start Date End Date Taking? Authorizing Provider  acetaminophen (TYLENOL) 325 MG tablet Take 650 mg by mouth every 6 (six) hours as needed for mild pain.   Yes Historical Provider, MD  hydrOXYzine (ATARAX/VISTARIL) 25 MG tablet Take 25 mg by mouth 2 (two) times daily as needed for anxiety.    Yes Historical Provider, MD  ondansetron (ZOFRAN) 8 MG tablet Take 8 mg by mouth every 8 (eight) hours as needed for nausea or vomiting.   Yes Historical Provider, MD  oxyCODONE-acetaminophen (PERCOCET/ROXICET) 5-325 MG tablet Take 1-2 tablets by mouth every 6 (six) hours as needed for severe pain. 02/05/17  Yes Gwyneth Sprout, MD  promethazine (PHENERGAN) 25 MG tablet Take 25 mg by mouth every 6 (six) hours as needed for nausea or vomiting.   Yes Historical Provider, MD  Vilazodone HCl 20 MG TABS Take 40 mg by mouth daily.  10/16/16  Yes Historical Provider, MD  apixaban (ELIQUIS) 5 MG TABS tablet Take 2 tablets (10 mg total) by mouth 2 (two) times daily. Take 2 tablets 2 times daily for 7 days followed by one tablet 2 times daily Patient not taking: Reported on 02/04/2017 12/23/16   Joni Reining Pisciotta, PA-C  atorvastatin (LIPITOR) 10 MG tablet Take 1 tablet (10 mg total) by mouth daily at 6 PM. Patient not taking: Reported on 12/23/2016 07/10/16   Alison Murray, MD  clindamycin (CLEOCIN) 300  MG capsule Take 1 capsule (300 mg total) by mouth 3 (three) times daily. Patient not taking: Reported on 03/16/2017 02/04/17   Burgess Amor, PA-C  magic mouthwash w/lidocaine SOLN Take 10 mLs by mouth 4 (four) times daily as needed (throat pain). Note to pharmacy - equal parts diphendydramine, aluminum hydroxide and lidocaine HCL Patient not taking: Reported on 03/16/2017 02/04/17   Burgess Amor, PA-C    Physical Exam: Vitals:   03/17/17 0330 03/17/17 0430 03/17/17 0500 03/17/17 0530  BP: 109/66 (!) 106/53 (!) 106/56 (!) 101/57  Pulse: 95 97 96 99  Resp: Temp:      TempSrc:      SpO2: 98% 96% 93% 95%  Weight:      Height:       General: Not in acute distress HEENT:       Eyes: PERRL, EOMI, no scleral icterus.       ENT: No  discharge from the ears and nose, no pharynx injection, no tonsillar enlargement.        Neck: No JVD, no bruit, no mass felt. Heme: No neck lymph node enlargement. Cardiac: S1/S2, RRR, No murmurs, No gallops or rubs. Respiratory: No rales, wheezing, rhonchi or rubs. GI: Soft, nondistended, nontender, no rebound pain, no organomegaly, BS present. GU: No hematuria Ext: No pitting leg edema bilaterally. 2+DP/PT pulse bilaterally. Has right calf tenderness Musculoskeletal: No joint deformities, No joint redness or warmth, no limitation of ROM in spin. Skin: No rashes.  Neuro: Alert, oriented X3, cranial nerves II-XII grossly intact. Muscle strength 0/5 in right leg and 2/5 in right arm. Sensation to light touch intact is decreased in right side.  Psych: Patient is not psychotic, no suicidal or hemocidal ideation.  Labs on Admission: I have personally reviewed following labs and imaging studies  CBC:  Recent Labs Lab 03/16/17 2138 03/16/17 2154  WBC 9.4  --   NEUTROABS 5.0  --   HGB 14.8 16.0*  HCT 43.7 47.0*  MCV 85.5  --   PLT 402*  --    Basic Metabolic Panel:  Recent Labs Lab 03/16/17 2138 03/16/17 2154  NA 133* 135  K 3.7 3.6  CL 104 105   CO2 18*  --   GLUCOSE 96 100*  BUN 11 13  CREATININE 0.73 0.70  CALCIUM 8.8*  --    GFR: Estimated Creatinine Clearance: 121.4 mL/min (by C-G formula based on SCr of 0.7 mg/dL). Liver Function Tests:  Recent Labs Lab 03/16/17 2138  AST 17  ALT 9*  ALKPHOS 55  BILITOT 0.3  PROT 7.8  ALBUMIN 3.4*   No results for input(s): LIPASE, AMYLASE in the last 168 hours. No results for input(s): AMMONIA in the last 168 hours. Coagulation Profile:  Recent Labs Lab 03/16/17 2138  INR 0.97   Cardiac Enzymes: No results for input(s): CKTOTAL, CKMB, CKMBINDEX, TROPONINI in the last 168 hours. BNP (last 3 results) No results for input(s): PROBNP in the last 8760 hours. HbA1C: No results for input(s): HGBA1C in the last 72 hours. CBG:  Recent Labs Lab 03/16/17 2236  GLUCAP 106*   Lipid Profile: No results for input(s): CHOL, HDL, LDLCALC, TRIG, CHOLHDL, LDLDIRECT in the last 72 hours. Thyroid Function Tests: No results for input(s): TSH, T4TOTAL, FREET4, T3FREE, THYROIDAB in the last 72 hours. Anemia Panel: No results for input(s): VITAMINB12, FOLATE, FERRITIN, TIBC, IRON, RETICCTPCT in the last 72 hours. Urine analysis:    Component Value Date/Time   COLORURINE YELLOW 03/16/2017 2235   APPEARANCEUR HAZY (A) 03/16/2017 2235   LABSPEC 1.014 03/16/2017 2235   PHURINE 5.0 03/16/2017 2235   GLUCOSEU NEGATIVE 03/16/2017 2235   HGBUR NEGATIVE 03/16/2017 2235   BILIRUBINUR NEGATIVE 03/16/2017 2235   KETONESUR NEGATIVE 03/16/2017 2235   PROTEINUR NEGATIVE 03/16/2017 2235   UROBILINOGEN 0.2 02/10/2011 1530   NITRITE NEGATIVE 03/16/2017 2235   LEUKOCYTESUR LARGE (A) 03/16/2017 2235   Sepsis Labs: @LABRCNTIP (procalcitonin:4,lacticidven:4) )No results found for this or any previous visit (from the past 240 hour(s)).   Radiological Exams on Admission: Ct Head Wo Contrast  Result Date: 03/16/2017 CLINICAL DATA:  Right leg and arm weakness and numbness. EXAM: CT HEAD WITHOUT  CONTRAST TECHNIQUE: Contiguous axial images were obtained from the base of the skull through the vertex without intravenous contrast. COMPARISON:  12/28/2016 and 07/07/2016 FINDINGS: Brain: Ventricles, cisterns and other CSF spaces are within normal. There is no mass, mass effect, shift of midline structures or  acute hemorrhage. No evidence of acute infarction. Vascular: Normal. Skull: Normal. Sinuses/Orbits: The orbits are normal. Subtle focal opacification over the lateral wall the right maxillary sinus. Other: None. IMPRESSION: No acute intracranial findings. Minimal chronic inflammatory change right maxillary sinus. Electronically Signed   By: Elberta Fortis M.D.   On: 03/16/2017 22:17   Ct Angio Chest Pe W And/or Wo Contrast  Result Date: 03/17/2017 CLINICAL DATA:  Right leg paresthesia and weakness EXAM: CT ANGIOGRAPHY CHEST WITH CONTRAST TECHNIQUE: Multidetector CT imaging of the chest was performed using the standard protocol during bolus administration of intravenous contrast. Multiplanar CT image reconstructions and MIPs were obtained to evaluate the vascular anatomy. CONTRAST:  100 cc Isovue 370 IV COMPARISON:  12/23/2016 CT FINDINGS: Cardiovascular: Satisfactory opacification of the pulmonary arteries to the proximal segmental level given patient body habitus. No evidence of pulmonary embolism. Normal heart size. No pericardial effusion. Mediastinum/Nodes: No enlarged mediastinal, hilar, or axillary lymph nodes. Thyroid gland, trachea, and esophagus demonstrate no significant findings. Lungs/Pleura: Lungs are clear. There is dependent atelectasis bilaterally. No pleural effusion or pneumothorax. Upper Abdomen: No acute abnormality. Musculoskeletal: No chest wall abnormality. No acute or significant osseous findings. Review of the MIP images confirms the above findings. IMPRESSION: 1. No acute cardiopulmonary disease. 2. No large central pulmonary embolus. 3. No aortic dissection or aneurysm.  Electronically Signed   By: Tollie Eth M.D.   On: 03/17/2017 01:34   Mr Maxine Glenn Head Wo Contrast  Result Date: 03/17/2017 CLINICAL DATA:  26 y/o  F; right arm and leg weakness. EXAM: MRI HEAD WITHOUT CONTRAST MRA HEAD WITHOUT CONTRAST MRV HEAD WITHOUT AND WITH CONTRAST TECHNIQUE: Multiplanar, multiecho pulse sequences of the brain and surrounding structures were obtained without intravenous contrast. Angiographic images of the head were obtained using MRA technique without contrast. Angiographic images were obtained of the head with MRV technique with and without intravenous contrast. COMPARISON:  03/16/2017 CT of the head. 12/28/2016 MRI and MRA of the head. CONTRAST:  20 cc MultiHance FINDINGS: MRI HEAD FINDINGS Brain: No acute infarction, hemorrhage, hydrocephalus, extra-axial collection or mass lesion. Stable few nonspecific foci of T2 FLAIR hyperintensity are present within subcortical white matter of the frontal lobes bilaterally. No white matter lesions identified within splenium of corpus callosum, periventricular white matter, brainstem, basal ganglia, or cerebellum. Vascular: As below. Skull and upper cervical spine: Normal marrow signal. Sinuses/Orbits: Small right maxillary sinus mucous retention cyst. Otherwise no abnormal signal of the visualized paranasal sinuses or mastoid air cells. Other: None. MRA HEAD FINDINGS Internal carotid arteries:  Patent. Anterior cerebral arteries:  Patent. Middle cerebral arteries: Patent. Anterior communicating artery: Patent. Posterior communicating arteries: Probable diminutive posterior communicating arteries bilaterally. Posterior cerebral arteries:  Patent. Basilar artery:  Patent. Vertebral arteries:  Patent. No evidence of high-grade stenosis, large vessel occlusion, or aneurysm unless noted above. MRV HEAD FINDINGS Normal flow related signal on time-of-flight MRV of the superior sagittal sinus, straight sinus, internal cerebral veins, basal veins of Rosenthal,  bilateral transverse sinuses, bilateral sigmoid sinuses, bilateral upper internal jugular veins, and large cortical veins. After administration of intravenous contrast there is normal enhancement of the dural venous sinuses as above. IMPRESSION: 1. No acute intracranial abnormality identified. 2. Stable nonspecific foci of T2 FLAIR hyperintensity within subcortical white matter of the frontal lobes, possibly related to migraine headaches or vasculopathy. Atypical distribution for demyelination. 3. Normal MRA of the head without contrast. 4. Normal MRV of the head with and without contrast. Electronically Signed   By: Micah Noel  [place]-Stratton M.D.   On: 03/17/2017 03:13   Mr Brain Wo Contrast  Result Date: 03/17/2017 CLINICAL DATA:  26 y/o  F; right arm and leg weakness. EXAM: MRI HEAD WITHOUT CONTRAST MRA HEAD WITHOUT CONTRAST MRV HEAD WITHOUT AND WITH CONTRAST TECHNIQUE: Multiplanar, multiecho pulse sequences of the brain and surrounding structures were obtained without intravenous contrast. Angiographic images of the head were obtained using MRA technique without contrast. Angiographic images were obtained of the head with MRV technique with and without intravenous contrast. COMPARISON:  03/16/2017 CT of the head. 12/28/2016 MRI and MRA of the head. CONTRAST:  20 cc MultiHance FINDINGS: MRI HEAD FINDINGS Brain: No acute infarction, hemorrhage, hydrocephalus, extra-axial collection or mass lesion. Stable few nonspecific foci of T2 FLAIR hyperintensity are present within subcortical white matter of the frontal lobes bilaterally. No white matter lesions identified within splenium of corpus callosum, periventricular white matter, brainstem, basal ganglia, or cerebellum. Vascular: As below. Skull and upper cervical spine: Normal marrow signal. Sinuses/Orbits: Small right maxillary sinus mucous retention cyst. Otherwise no abnormal signal of the visualized paranasal sinuses or mastoid air cells. Other: None. MRA HEAD  FINDINGS Internal carotid arteries:  Patent. Anterior cerebral arteries:  Patent. Middle cerebral arteries: Patent. Anterior communicating artery: Patent. Posterior communicating arteries: Probable diminutive posterior communicating arteries bilaterally. Posterior cerebral arteries:  Patent. Basilar artery:  Patent. Vertebral arteries:  Patent. No evidence of high-grade stenosis, large vessel occlusion, or aneurysm unless noted above. MRV HEAD FINDINGS Normal flow related signal on time-of-flight MRV of the superior sagittal sinus, straight sinus, internal cerebral veins, basal veins of Rosenthal, bilateral transverse sinuses, bilateral sigmoid sinuses, bilateral upper internal jugular veins, and large cortical veins. After administration of intravenous contrast there is normal enhancement of the dural venous sinuses as above. IMPRESSION: 1. No acute intracranial abnormality identified. 2. Stable nonspecific foci of T2 FLAIR hyperintensity within subcortical white matter of the frontal lobes, possibly related to migraine headaches or vasculopathy. Atypical distribution for demyelination. 3. Normal MRA of the head without contrast. 4. Normal MRV of the head with and without contrast. Electronically Signed   By: Mitzi Hansen M.D.   On: 03/17/2017 03:13   Mr Mrv Almyra Deforest GE Cm  Result Date: 03/17/2017 CLINICAL DATA:  26 y/o  F; right arm and leg weakness. EXAM: MRI HEAD WITHOUT CONTRAST MRA HEAD WITHOUT CONTRAST MRV HEAD WITHOUT AND WITH CONTRAST TECHNIQUE: Multiplanar, multiecho pulse sequences of the brain and surrounding structures were obtained without intravenous contrast. Angiographic images of the head were obtained using MRA technique without contrast. Angiographic images were obtained of the head with MRV technique with and without intravenous contrast. COMPARISON:  03/16/2017 CT of the head. 12/28/2016 MRI and MRA of the head. CONTRAST:  20 cc MultiHance FINDINGS: MRI HEAD FINDINGS Brain: No  acute infarction, hemorrhage, hydrocephalus, extra-axial collection or mass lesion. Stable few nonspecific foci of T2 FLAIR hyperintensity are present within subcortical white matter of the frontal lobes bilaterally. No white matter lesions identified within splenium of corpus callosum, periventricular white matter, brainstem, basal ganglia, or cerebellum. Vascular: As below. Skull and upper cervical spine: Normal marrow signal. Sinuses/Orbits: Small right maxillary sinus mucous retention cyst. Otherwise no abnormal signal of the visualized paranasal sinuses or mastoid air cells. Other: None. MRA HEAD FINDINGS Internal carotid arteries:  Patent. Anterior cerebral arteries:  Patent. Middle cerebral arteries: Patent. Anterior communicating artery: Patent. Posterior communicating arteries: Probable diminutive posterior communicating arteries bilaterally. Posterior cerebral arteries:  Patent. Basilar artery:  Patent. Vertebral arteries:  Patent. No evidence of high-grade stenosis, large vessel occlusion, or aneurysm unless noted above. MRV HEAD FINDINGS Normal flow related signal on time-of-flight MRV of the superior sagittal sinus, straight sinus, internal cerebral veins, basal veins of Rosenthal, bilateral transverse sinuses, bilateral sigmoid sinuses, bilateral upper internal jugular veins, and large cortical veins. After administration of intravenous contrast there is normal enhancement of the dural venous sinuses as above. IMPRESSION: 1. No acute intracranial abnormality identified. 2. Stable nonspecific foci of T2 FLAIR hyperintensity within subcortical white matter of the frontal lobes, possibly related to migraine headaches or vasculopathy. Atypical distribution for demyelination. 3. Normal MRA of the head without contrast. 4. Normal MRV of the head with and without contrast. Electronically Signed   By: Mitzi Hansen M.D.   On: 03/17/2017 03:13     EKG: Independently reviewed.  Not done in ED,  will get one.   Assessment/Plan Principal Problem:   Right sided weakness Active Problems:   Chest pain   Depression   Tobacco abuse   Calf pain   UTI (urinary tract infection)   Stroke (cerebrum) (HCC)   Right sided weakness and numbness: Etiology is not clear. MRI, MRA, MRV of the brain are all not impressive. Concerning for infarction to C spinal cord. Neurology, Dr. Otelia Limes was consulted.  -When admitted to stepdown as inpatient. -Highly appreciate Dr. consultation, will follow recommendations as follows:  1. MRI cervical spine with and without contrast to rule out cervical cord lesion. 2. PT consult 3. OT consult 4. TTE to evaluate for progression versus resolution of right atrial thrombus. If negative, obtain TEE.  5. Would restart therapeutic anticoagulation-->started IV heparin 6. Cardiology consult regarding #4 and #5 above.  7. Telemetry monitoring 8. Frequent neuro checks 9. Carotid ultrasound.  10. Social work consult regarding insurance and inability to afford Eliquis prescription-->will get CM consult  Chest pain: Etiology is not clear. CT angiogram of chest is negative for PE or dissection. Will need to rule out mural thrombosis and ACS. - cycle CE q6 x3 and repeat EKG in the am  - prn percocet for pain - Risk factor stratification: will check FLP and A1C  - 2d echo-->If negative, obtain TEE.  - will get LE doppler to r/o DVT due to right calf pain - please call Card in AM  HLD: Last LDL was 113 on 07/08/16, goal should be <70. Was on Lipitor, but not taking it. -Check FLP  Depression: Stable, no suicidal or homicidal ideations. -Continue home medications: Vilazodone  UTI (urinary tract infection): Patient has positive UTI. She does not have symptoms of UTI. Due to right-sided weakness and numbness and possible spinal cord infarction, she may be nonsensitive to symptoms. -IV aztreonam Depression follow-up blood culture and urine culture.    DVT ppx: On  IV Heparin Code Status: Full code Family Communication: None at bed side.  Disposition Plan:  Anticipate discharge back to previous home environment Consults called:  Neuro, Dr. Otelia Limes Admission status:  SDU/inpation       Date of Service 03/17/2017    Lorretta Harp [facility] Pager [phone]  If 7PM-7AM, please contact night-coverage [email or web address] Password [facility] 03/17/2017, 6:14 AM

## 2017-03-17 NOTE — Progress Notes (Signed)
Echocardiogram 2D Echocardiogram has been performed.  03/17/2017 4:24 PM Gertie Fey, BS, RVT, RDCS, RDMS

## 2017-03-17 NOTE — ED Notes (Signed)
Pt states she called down to the cafeteria requesting her meal. States 'they won't tell me anything. You have to do it.' Pt requesting vanilla pudding, yogurt, and tea. NS calling in order

## 2017-03-17 NOTE — Progress Notes (Signed)
PT Cancellation Note  Patient Details Name: Sequita Wise MRN: [id number] DOB: 1991/09/25   Cancelled Treatment:    Reason Eval/Treat Not Completed: Patient at procedure or test/unavailable.  Pt is away at a test.  PT will check back later today or tomorrow as time allows.    Thanks,    Rollene Rotunda. Aniket Paye, PT, DPT [phone]   03/17/2017, 3:30 PM

## 2017-03-17 NOTE — Progress Notes (Addendum)
ANTICOAGULATION/ANTIBIOTIC CONSULT NOTE - Initial Consult  Pharmacy Consult for Heparin and Aztreonam Indication: h/o atrial thrombus and UTI  Allergies  Allergen Reactions  . Fluoxetine Other (See Comments)    Pt told not to take with eliquis  . Ceftriaxone Itching and Other (See Comments)    Red man syndrome  . Gabapentin Other (See Comments)    Confusion/ chest pains  . Morphine And Related Itching and Nausea And Vomiting  . Nsaids Other (See Comments)    Pt told by hematologist not to take  . Topamax [Topiramate] Other (See Comments)    Dizziness, black out spells  . Toradol [Ketorolac Tromethamine] Itching and Other (See Comments)    Felt hot  . Tramadol     Flushed   . Vicodin [Hydrocodone-Acetaminophen] Other (See Comments)    hallucinations  . Depakote [Divalproex Sodium] Rash and Other (See Comments)    Skin felt like it was on fire  . Methocarbamol Rash  . Metoclopramide Rash and Hives    Patient Measurements: Height:  (160 cm) Weight: 228 lb (103.4 kg) IBW/kg (Calculated) : 52.4 Heparin Dosing Weight: 76 kg  Vital Signs: Temp: 98.1 F (36.7 C) (04/09 0241) Temp Source: Oral (04/08 2200) BP: 106/56 (04/09 0500) Pulse Rate: 96 (04/09 0500)  Labs:  Recent Labs  03/16/17 2138 03/16/17 2154  HGB 14.8 16.0*  HCT 43.7 47.0*  PLT 402*  --   APTT 26  --   LABPROT 12.9  --   INR 0.97  --   CREATININE 0.73 0.70    Estimated Creatinine Clearance: 121.4 mL/min (by C-G formula based on SCr of 0.7 mg/dL).   Medical History: Past Medical History:  Diagnosis Date  . Asthma   . Coronary artery disease    clotting disorder  . Depression   . PTSD (post-traumatic stress disorder)   . Thrombosis of right atrium   . Tobacco abuse     Medications:  See electronic med rec  Assessment: 26 y.o. F presents with CP, numbness.   AC: Pt was on Eliquis in the past for R atrial thrombus seen on TEE 07/2016 but pt has not been taking for at least 2 months.  CBC ok at baseline. To begin heparin gtt for h/o R atrial thrombus.  ID: Aztreonam for UTI.   Goal of Therapy:  Heparin level 0.3-0.7 units/ml Monitor platelets by anticoagulation protocol: Yes   Plan:  Heparin IV bolus 5000 units Heparin gtt at 1250 units/hr Will f/u heparin level in 6 hours Daily heparin level and CBC Aztreonam 1gm IV q8h Will f/u renal function, micro data, and pt's clinical condition  Christoper Fabian, PharmD, BCPS Clinical pharmacist, pager [phone] 03/17/2017,5:16 AM

## 2017-03-17 NOTE — Progress Notes (Signed)
ANTICOAGULATION CONSULT NOTE   Pharmacy Consult for Heparin  Indication: h/o atrial thrombus   Allergies  Allergen Reactions  . Fluoxetine Other (See Comments)    Pt told not to take with eliquis  . Ceftriaxone Itching and Other (See Comments)    Red man syndrome  . Gabapentin Other (See Comments)    Confusion/ chest pains  . Morphine And Related Itching and Nausea And Vomiting  . Nsaids Other (See Comments)    Pt told by hematologist not to take  . Topamax [Topiramate] Other (See Comments)    Dizziness, black out spells  . Toradol [Ketorolac Tromethamine] Itching and Other (See Comments)    Felt hot  . Tramadol     Flushed   . Vicodin [Hydrocodone-Acetaminophen] Other (See Comments)    hallucinations  . Depakote [Divalproex Sodium] Rash and Other (See Comments)    Skin felt like it was on fire  . Methocarbamol Rash  . Metoclopramide Rash and Hives    Patient Measurements: Height:  (160 cm) Weight: 228 lb (103.4 kg) IBW/kg (Calculated) : 52.4 Heparin Dosing Weight: 76 kg  Vital Signs: Temp: 98.1 F (36.7 C) (04/09 0241) BP: 108/67 (04/09 0830) Pulse Rate: 90 (04/09 0830)  Labs:  Recent Labs  03/16/17 2138 03/16/17 2154 03/17/17 0554 03/17/17 1237  HGB 14.8 16.0*  --   --   HCT 43.7 47.0*  --   --   PLT 402*  --   --   --   APTT 26  --   --   --   LABPROT 12.9  --   --   --   INR 0.97  --   --   --   HEPARINUNFRC  --   --   --  0.47  CREATININE 0.73 0.70  --   --   TROPONINI  --   --  <0.03 <0.03    Estimated Creatinine Clearance: 121.4 mL/min (by C-G formula based on SCr of 0.7 mg/dL).  Assessment: 26 y.o. F presents with CP, numbness. She was previously on apixaban for R atrial thrombus seen on TEE 07/2016 but pt has not been taking for at least 2 months. She was started on IV heparin.    Goal of Therapy:  Heparin level 0.3-0.7 units/ml Monitor platelets by anticoagulation protocol: Yes   Plan:  Continue Heparin gtt at 1250 units/hr Check  a 6 hr heparin level to confirm Daily heparin level and CBC  Lysle Pearl, PharmD, BCPS Pager # [phone] 03/17/2017 2:06 PM

## 2017-03-18 ENCOUNTER — Encounter (HOSPITAL_COMMUNITY)
Admission: EM | Disposition: A | Discharge status: Home / Self Care | Payer: Self-pay | Source: Home / Self Care | Attending: Internal Medicine

## 2017-03-18 ENCOUNTER — Encounter (HOSPITAL_COMMUNITY): Payer: Self-pay | Admitting: *Deleted

## 2017-03-18 DIAGNOSIS — F449 Dissociative and conversion disorder, unspecified: Secondary | ICD-10-CM

## 2017-03-18 DIAGNOSIS — Z91199 Patient's noncompliance with other medical treatment and regimen due to unspecified reason: Secondary | ICD-10-CM

## 2017-03-18 DIAGNOSIS — R202 Paresthesia of skin: Secondary | ICD-10-CM

## 2017-03-18 DIAGNOSIS — I200 Unstable angina: Secondary | ICD-10-CM

## 2017-03-18 DIAGNOSIS — I513 Intracardiac thrombosis, not elsewhere classified: Secondary | ICD-10-CM

## 2017-03-18 DIAGNOSIS — R1310 Dysphagia, unspecified: Secondary | ICD-10-CM

## 2017-03-18 DIAGNOSIS — D689 Coagulation defect, unspecified: Secondary | ICD-10-CM

## 2017-03-18 DIAGNOSIS — Z9119 Patient's noncompliance with other medical treatment and regimen: Secondary | ICD-10-CM

## 2017-03-18 LAB — CBC
HEMATOCRIT: 41.2 % (ref 36.0–46.0)
Hemoglobin: 13.3 g/dL (ref 12.0–15.0)
MCH: 28 pg (ref 26.0–34.0)
MCHC: 32.3 g/dL (ref 30.0–36.0)
MCV: 86.7 fL (ref 78.0–100.0)
PLATELETS: 318 10*3/uL (ref 150–400)
RBC: 4.75 MIL/uL (ref 3.87–5.11)
RDW: 13.6 % (ref 11.5–15.5)
WBC: 6.9 10*3/uL (ref 4.0–10.5)

## 2017-03-18 LAB — LIPID PANEL
CHOLESTEROL: 176 mg/dL (ref 0–200)
HDL: 36 mg/dL — AB (ref >40)
LDL CALC: 92 mg/dL (ref 0–99)
TRIGLYCERIDES: 239 mg/dL — AB (ref <150)
Total CHOL/HDL Ratio: 4.9 RATIO
VLDL: 48 mg/dL — ABNORMAL HIGH (ref 0–40)

## 2017-03-18 LAB — URINE CULTURE

## 2017-03-18 LAB — COMPREHENSIVE METABOLIC PANEL
ALBUMIN: 3.3 g/dL — AB (ref 3.5–5.0)
ALT: 8 U/L — ABNORMAL LOW (ref 14–54)
ANION GAP: 9 (ref 5–15)
AST: 16 U/L (ref 15–41)
Alkaline Phosphatase: 50 U/L (ref 38–126)
BILIRUBIN TOTAL: 0.2 mg/dL — AB (ref 0.3–1.2)
BUN: 5 mg/dL — ABNORMAL LOW (ref 6–20)
CO2: 23 mmol/L (ref 22–32)
Calcium: 8.8 mg/dL — ABNORMAL LOW (ref 8.9–10.3)
Chloride: 106 mmol/L (ref 101–111)
Creatinine, Ser: 0.69 mg/dL (ref 0.44–1.00)
GFR calc non Af Amer: >60 mL/min (ref >60)
GLUCOSE: 94 mg/dL (ref 65–99)
POTASSIUM: 4.4 mmol/L (ref 3.5–5.1)
SODIUM: 138 mmol/L (ref 135–145)
TOTAL PROTEIN: 6.4 g/dL — AB (ref 6.5–8.1)

## 2017-03-18 LAB — HEPARIN LEVEL (UNFRACTIONATED): Heparin Unfractionated: 0.41 IU/mL (ref 0.30–0.70)

## 2017-03-18 LAB — MRSA PCR SCREENING: MRSA by PCR: NEGATIVE

## 2017-03-18 SURGERY — CANCELLED PROCEDURE

## 2017-03-18 MED ORDER — LORAZEPAM 0.5 MG PO TABS
0.5000 mg | ORAL_TABLET | Freq: Three times a day (TID) | ORAL | Status: DC | PRN
  Administered 2017-03-18 – 2017-03-19 (×2): 0.5 mg via ORAL
  Filled 2017-03-18 (×2): qty 1

## 2017-03-18 MED ORDER — APIXABAN 5 MG PO TABS
5.0000 mg | ORAL_TABLET | Freq: Two times a day (BID) | ORAL | Status: DC
  Administered 2017-03-18 – 2017-03-19 (×3): 5 mg via ORAL
  Filled 2017-03-18 (×3): qty 1

## 2017-03-18 MED ORDER — PANTOPRAZOLE SODIUM 40 MG PO PACK: 40.0000 mg | PACK | Freq: Every day | ORAL | 1 refills | Status: DC

## 2017-03-18 MED ORDER — OXYCODONE-ACETAMINOPHEN 5-325 MG PO TABS
1.0000 | ORAL_TABLET | ORAL | Status: DC | PRN
Start: 2017-03-18 — End: 2017-03-19
  Administered 2017-03-18 – 2017-03-19 (×4): 2 via ORAL
  Filled 2017-03-18 (×4): qty 2

## 2017-03-18 MED ORDER — SUCRALFATE 1 GM/10ML PO SUSP: 1.0000 g | Freq: Three times a day (TID) | ORAL | 0 refills | Status: DC

## 2017-03-18 NOTE — Care Management Note (Addendum)
Case Management Note  Patient Details  Name: [name] MRN: [id number] Date of Birth: 29-Jul-1991  Subjective/Objective:  Pt presented for Chest Pain and Right Leg Weakness. Pt is without insurance and PCP- Per pt she has utilized Eliquis in the past. Pt will not be able to use the 30 day free card at d/c due to has used in the past. Pt has used the [facility] in the past in [place]. Pt now states she will be able to get her medications via the [facility]- per pt she will be able to get a voucher for her medications and they should be free. CM will place an Eliquis Patient Assistance Application on the shadow chart for MD to fill out.                 Action/Plan: CM did place a call to the Financial Counselor as well- pt wants to explore Medicaid. From documentation pt has been screened prior and did not meet eligibility requirements for Medicaid. No further needs from CM at this time.   Expected Discharge Date:                  Expected Discharge Plan:  Home/Self Care  In-House Referral:  NA, Financial Counselor  Discharge planning Services  CM Consult, Medication Assistance  Post Acute Care Choice:  NA Choice offered to:  NA  DME Arranged:  N/A DME Agency:  NA  HH Arranged:  NA HH Agency:  NA  Status of Service:  Completed, signed off  If discussed at Long Length of Stay Meetings, dates discussed:    Additional Comments:  Barbara Lewandowsky, RN 03/18/2017, 12:47 PM

## 2017-03-18 NOTE — Progress Notes (Signed)
The following information is obtained from the [facility] Controlled Substance Reporting System  Controlled substances prescribed for patient in the past 6 months  Fill Date Product, Str, Form Qty Days Pt ID Prescriber Written RX# N/R* Pharm **MED+ ---------- -------------------------------- ------ ---- --------- ---------- ---------- ------------ ----- --------- ------ 02/08/2017 OXYCODONE-ACETAMINOPHEN 5-325 15.00 2 [id number] 02/05/2017 [id number] N [id number] 56.25 11/01/2016 OXYCODONE-ACETAMINOPHEN 5-325 15.00 2 [id number] 10/16/2016 [id number] N [id number] 56.25 10/27/2016 OXYCODONE-ACETAMINOPHEN 5-325 20.00 3 [id number] 10/11/2016 [id number] N [id number] 50.0 *N/R N=New R=Refill +MED Daily Prescribers for prescriptions listed ---------------------------------------------------------------------------------------------------------------------------------- [id number] [name] (PA); [facility] [address] [name] [name] [address] [id number] [name] [facility] [place] [name] [address] [id number] [name], MD; [facility], [facility] [name] [place] [address] Pharmacies that dispensed prescriptions listed ---------------------------------------------------------------------------------------------------------------------------------- [id number] [facility] [address]; [address] Joylene Igo [place] [address], Patients that match search criteria ---------------------------------------------------------------------------------------------------------------------------------- [id number] [name], DOB 31-Dec-1990; [address], [place] [address] MED Summary This section displays cumulative MED values by unique recipient. The MED Max value is the maximum occurrence of cumulative MED sustained for any 3 consecutive days.  This value is calculated based on prescriptions dispensed during the date range requested. ----------------------------------------------------------------------------------------------------------------------------------- [address] [name]; Feb 10, 1991; [address] [place] [address]

## 2017-03-18 NOTE — Progress Notes (Signed)
Ref: No PCP Per Patient   Subjective:  Feeling better.  Objective:  Vital Signs in the last 24 hours: Temp:  [98 F (36.7 C)-98.6 F (37 C)] 98.3 F (36.8 C) (04/10 0813) Pulse Rate:  [88-103] 89 (04/10 0813) Cardiac Rhythm: Sinus tachycardia (04/09 2030) Resp:  [14-24] 17 (04/10 0725) BP: (95-124)/(35-62) 95/35 (04/10 0813) SpO2:  [96 %-99 %] 99 % (04/10 0813) Weight:  [103.4 kg (228 lb)-105.6 kg (232 lb 12.9 oz)] 105.6 kg (232 lb 12.9 oz) (04/10 0725)  Physical Exam: BP Readings from Last 1 Encounters:  03/18/17 (!) 95/35    Wt Readings from Last 1 Encounters:  03/18/17 105.6 kg (232 lb 12.9 oz)    Weight change: 0 kg (0 lb) Body mass index is 41.24 kg/m. HEENT: [place]/AT, Eyes-Brown, PERL, EOMI, Conjunctiva-Pink, Sclera-Non-icteric Neck: No JVD, No bruit, Trachea midline. Lungs:  Clear, Bilateral. Midline surgical scar. Cardiac:  Regular rhythm, normal S1 and S2, no S3. II/VI systolic murmur. Abdomen:  Soft, non-tender. BS present. Extremities:  Trace edema present. No cyanosis. No clubbing. CNS: AxOx3, Cranial nerves grossly intact, moves all 4 extremities.  Skin: Warm and dry.   Intake/Output from previous day: 04/09 0701 - 04/10 0700 In: 950.6 [P.O.:240; I.V.:710.6] Out: 500 [Urine:500]    Lab Results: BMET    Component Value Date/Time   NA 138 03/18/2017 0406   NA 135 03/16/2017 2154   NA 133 (L) 03/16/2017 2138   K 4.4 03/18/2017 0406   K 3.6 03/16/2017 2154   K 3.7 03/16/2017 2138   CL 106 03/18/2017 0406   CL 105 03/16/2017 2154   CL 104 03/16/2017 2138   CO2 23 03/18/2017 0406   CO2 18 (L) 03/16/2017 2138   CO2 22 02/04/2017 1730   GLUCOSE 94 03/18/2017 0406   GLUCOSE 100 (H) 03/16/2017 2154   GLUCOSE 96 03/16/2017 2138   BUN 5 (L) 03/18/2017 0406   BUN 13 03/16/2017 2154   BUN 11 03/16/2017 2138   CREATININE 0.69 03/18/2017 0406   CREATININE 0.70 03/16/2017 2154   CREATININE 0.73 03/16/2017 2138   CALCIUM 8.8 (L) 03/18/2017 0406   CALCIUM  8.8 (L) 03/16/2017 2138   CALCIUM 9.1 02/04/2017 1730   GFRNONAA >60 03/18/2017 0406   GFRNONAA >60 03/16/2017 2138   GFRNONAA >60 02/04/2017 1730   GFRAA >60 03/18/2017 0406   GFRAA >60 03/16/2017 2138   GFRAA >60 02/04/2017 1730   CBC    Component Value Date/Time   WBC 6.9 03/18/2017 0406   RBC 4.75 03/18/2017 0406   HGB 13.3 03/18/2017 0406   HCT 41.2 03/18/2017 0406   PLT 318 03/18/2017 0406   MCV 86.7 03/18/2017 0406   MCH 28.0 03/18/2017 0406   MCHC 32.3 03/18/2017 0406   RDW 13.6 03/18/2017 0406   LYMPHSABS 3.6 03/16/2017 2138   MONOABS 0.7 03/16/2017 2138   EOSABS 0.1 03/16/2017 2138   BASOSABS 0.0 03/16/2017 2138   HEPATIC Function Panel  Recent Labs  12/28/16 0240 03/16/17 2138 03/18/17 0406  PROT 7.3 7.8 6.4*   HEMOGLOBIN A1C No components found for: HGA1C,  MPG CARDIAC ENZYMES Lab Results  Component Value Date   TROPONINI <0.03 03/17/2017   TROPONINI <0.03 03/17/2017   TROPONINI <0.03 03/17/2017   BNP No results for input(s): PROBNP in the last 8760 hours. TSH No results for input(s): TSH in the last 8760 hours. CHOLESTEROL  Recent Labs  07/08/16 0338 03/18/17 0406  CHOL 183 176    Scheduled Meds: .  stroke:  mapping our early stages of recovery book   Does not apply Once  . apixaban  5 mg Oral BID  . pantoprazole sodium  40 mg Oral Daily  . sucralfate  1 g Oral TID WC & HS  . Vilazodone HCl  40 mg Oral Daily   Continuous Infusions: . sodium chloride 75 mL/hr at 03/17/17 1916   PRN Meds:.acetaminophen, hydrOXYzine, nitroGLYCERIN, ondansetron, oxyCODONE, promethazine, senna-docusate, zolpidem  Assessment/Plan: Chest pain, musculoskeletal H/O right atrial thrombus PTSD Tobacco use disorder H/O stroke with right leg weakness  TEE postponed for tomorrow due to scheduling problem with endoscopy.   LOS: 1 day    Ojani Berenson  MD  03/18/2017, 10:19 AM     

## 2017-03-18 NOTE — Evaluation (Signed)
Physical Therapy Evaluation Patient Details Name: [name] MRN: [id number] DOB: [birth year] Today's Date: 03/18/2017   History of Present Illness  26 y.o.female who presents with right-sided weakness and chest pain and shortness of breath. CT, MRI, and MRA negative. PMH: asthma, depression, PTSD, clotting disorder, TIA, CAD, thrombosis of right atrium, cardiac surgery.   Clinical Impression  Pt seen for initial PT evaluation and treatment. At this time the pt is demonstrating significant weakness through the Rt LE, impacting her ability to ambulate and basic mobility. She is able to ambulate but has to drag her Rt LE. Pt states that she has noticed some limited improvement since yesterday. At this time the pt is planning to return home at D/C with her friend checking on her as able. The pt would have to navigate stairs to enter her home. PT will continue to follow in anticipation of D/C home. Depending upon the patient's progress, D/C planning recommendations may need to be modified if not making anticipated progress.     Follow Up Recommendations Home health PT;Supervision - Intermittent    Equipment Recommendations  None recommended by PT    Recommendations for Other Services       Precautions / Restrictions Precautions Precautions: Fall Precaution Comments: Rt side weakness Restrictions Weight Bearing Restrictions: No      Mobility  Bed Mobility               General bed mobility comments: sitting EOB upon arrival  Transfers Overall transfer level: Needs assistance Equipment used: Rolling walker (2 wheeled) Transfers: Sit to/from Stand Sit to Stand: Supervision         General transfer comment: pt using bilateral UEs at rw for standing.   Ambulation/Gait Ambulation/Gait assistance: Min guard Ambulation Distance (Feet): 30 Feet Assistive device: Rolling walker (2 wheeled)   Gait velocity: slow pattern   General Gait Details: Pt unable to advance Rt LE,  dragging behind, heavy reliance on UEs. Pt able to move Rt LE forward to neutral position X2 with great effort.   Stairs            Wheelchair Mobility    Modified Rankin (Stroke Patients Only)       Balance Overall balance assessment: Needs assistance Sitting-balance support: No upper extremity supported Sitting balance-Leahy Scale: Good     Standing balance support: Bilateral upper extremity supported Standing balance-Leahy Scale: Poor Standing balance comment: using rw for support                             Pertinent Vitals/Pain Pain Assessment: Faces Faces Pain Scale: Hurts little more Pain Location: Rt LE Pain Descriptors / Indicators:  (hurts like when it's out in the cold too long) Pain Intervention(s): Monitored during session;Limited activity within patient's tolerance    Home Living Family/patient expects to be discharged to:: Private residence Living Arrangements: Alone Available Help at Discharge: Friend(s);Available PRN/intermittently Type of Home: Mobile home Home Access: Stairs to enter Entrance Stairs-Rails: None Entrance Stairs-Number of Steps: 4 Home Layout: One level Home Equipment: Walker - 2 wheels Additional Comments: Has a friend who can check on her.     Prior Function Level of Independence: Independent         Comments: working     Hand Dominance   Dominant Hand: Right    Extremity/Trunk Assessment   Upper Extremity Assessment Upper Extremity Assessment: RUE deficits/detail RUE Deficits / Details: able to use for transfers and  using rw during gait.     Lower Extremity Assessment Lower Extremity Assessment: RLE deficits/detail RLE Deficits / Details: hip flexion 2+/5, knee extension 3-/5, dorsiflexion 0/5, toe flexion/ext 3/5.   RLE Sensation: decreased light touch       Communication   Communication: No difficulties  Cognition Arousal/Alertness: Awake/alert Behavior During Therapy: WFL for tasks  assessed/performed Overall Cognitive Status: Within Functional Limits for tasks assessed                                        General Comments      Exercises     Assessment/Plan    PT Assessment Patient needs continued PT services  PT Problem List Decreased strength;Decreased activity tolerance;Decreased balance;Decreased mobility;Decreased coordination;Impaired sensation;Pain       PT Treatment Interventions DME instruction;Gait training;Stair training;Therapeutic exercise;Therapeutic activities;Functional mobility training;Balance training;Neuromuscular re-education;Patient/family education    PT Goals (Current goals can be found in the Care Plan section)  Acute Rehab PT Goals Patient Stated Goal: get her Rt leg moving PT Goal Formulation: With patient Time For Goal Achievement: 04/01/17 Potential to Achieve Goals: Good    Frequency Min 3X/week   Barriers to discharge        Co-evaluation               End of Session Equipment Utilized During Treatment:  (pt declined use of gait belt) Activity Tolerance: Patient tolerated treatment well Patient left: in chair;with call bell/phone within reach Nurse Communication: Mobility status PT Visit Diagnosis: Unsteadiness on feet (R26.81);Muscle weakness (generalized) (M62.81);Difficulty in walking, not elsewhere classified (R26.2)    Time: 4540-9811 PT Time Calculation (min) (ACUTE ONLY): 23 min   Charges:   PT Evaluation $PT Eval Moderate Complexity: 1 Procedure PT Treatments $Gait Training: 8-22 mins   PT G Codes:        Christiane Ha, PT, CSCS Pager [phone] Office [phone] 03/18/2017, 9:12 AM

## 2017-03-18 NOTE — Discharge Summary (Signed)
Physician Discharge Summary  [name] MRN: [id number] DOB/AGE: [birth year] 26 y.o.  PCP: No PCP Per Patient   Admit date: 03/16/2017 Discharge date: 03/18/2017  Discharge Diagnoses:    Principal Problem:   Right sided weakness Active Problems:   Chest pain   Depression   Calf pain   UTI (urinary tract infection)   Stroke (cerebrum) (HCC)   Dysphagia   Paresthesia   Addendum: TEE cancelled today,postponed until tomorrow   Follow-up recommendations Follow-up with PCP in 3-5 days , including all  additional recommended appointments as below Follow-up CBC, CMP in 3-5 days       Current Discharge Medication List    START taking these medications   Details  pantoprazole sodium (PROTONIX) 40 mg/20 mL PACK Take 20 mLs (40 mg total) by mouth daily. Qty: 30 each, Refills: 1    sucralfate (CARAFATE) 1 GM/10ML suspension Take 10 mLs (1 g total) by mouth 4 (four) times daily -  with meals and at bedtime. Qty: 420 mL, Refills: 0      CONTINUE these medications which have NOT CHANGED   Details  acetaminophen (TYLENOL) 325 MG tablet Take 650 mg by mouth every 6 (six) hours as needed for mild pain.    hydrOXYzine (ATARAX/VISTARIL) 25 MG tablet Take 25 mg by mouth 2 (two) times daily as needed for anxiety.     ondansetron (ZOFRAN) 8 MG tablet Take 8 mg by mouth every 8 (eight) hours as needed for nausea or vomiting.    promethazine (PHENERGAN) 25 MG tablet Take 25 mg by mouth every 6 (six) hours as needed for nausea or vomiting.    Vilazodone HCl 20 MG TABS Take 40 mg by mouth daily.     apixaban (ELIQUIS) 5 MG TABS tablet Take 2 tablets (10 mg total) by mouth 2 (two) times daily. Take 2 tablets 2 times daily for 7 days followed by one tablet 2 times daily Qty: 60 tablet, Refills: 0    atorvastatin (LIPITOR) 10 MG tablet Take 1 tablet (10 mg total) by mouth daily at 6 PM. Qty: 30 tablet, Refills: 0    magic mouthwash w/lidocaine SOLN Take 10 mLs by mouth 4 (four)  times daily as needed (throat pain). Note to pharmacy - equal parts diphendydramine, aluminum hydroxide and lidocaine HCL Qty: 120 mL, Refills: 0      STOP taking these medications     oxyCODONE-acetaminophen (PERCOCET/ROXICET) 5-325 MG tablet      clindamycin (CLEOCIN) 300 MG capsule          Discharge Condition: Stable   Discharge Instructions Get Medicines reviewed and adjusted: Please take all your medications with you for your next visit with your Primary MD  Please request your Primary MD to go over all hospital tests and procedure/radiological results at the follow up, please ask your Primary MD to get all Hospital records sent to his/her office.  If you experience worsening of your admission symptoms, develop shortness of breath, life threatening emergency, suicidal or homicidal thoughts you must seek medical attention immediately by calling 911 or calling your MD immediately if symptoms less severe.  You must read complete instructions/literature along with all the possible adverse reactions/side effects for all the Medicines you take and that have been prescribed to you. Take any new Medicines after you have completely understood and accpet all the possible adverse reactions/side effects.   Do not drive when taking Pain medications.   Do not take more than prescribed Pain, Sleep and Anxiety Medications  Special Instructions: If you have smoked or chewed Tobacco in the last 2 yrs please stop smoking, stop any regular Alcohol and or any Recreational drug use.  Wear Seat belts while driving.  Please note  You were cared for by a hospitalist during your hospital stay. Once you are discharged, your primary care physician will handle any further medical issues. Please note that NO REFILLS for any discharge medications will be authorized once you are discharged, as it is imperative that you return to your primary care physician (or establish a relationship with a primary  care physician if you do not have one) for your aftercare needs so that they can reassess your need for medications and monitor your lab values.     Allergies  Allergen Reactions  . Fluoxetine Other (See Comments)    Pt told not to take with eliquis  . Ceftriaxone Itching and Other (See Comments)    Red man syndrome  . Gabapentin Other (See Comments)    Confusion/ chest pains  . Morphine And Related Itching and Nausea And Vomiting  . Nsaids Other (See Comments)    Pt told by hematologist not to take  . Topamax [Topiramate] Other (See Comments)    Dizziness, black out spells  . Toradol [Ketorolac Tromethamine] Itching and Other (See Comments)    Felt hot  . Tramadol     Flushed   . Vicodin [Hydrocodone-Acetaminophen] Other (See Comments)    hallucinations  . Depakote [Divalproex Sodium] Rash and Other (See Comments)    Skin felt like it was on fire  . Methocarbamol Rash  . Metoclopramide Rash and Hives      Disposition: 01-Home or Self Care   Consults:  Cardiology     Significant Diagnostic Studies:  Ct Head Wo Contrast  Result Date: 03/16/2017 CLINICAL DATA:  Right leg and arm weakness and numbness. EXAM: CT HEAD WITHOUT CONTRAST TECHNIQUE: Contiguous axial images were obtained from the base of the skull through the vertex without intravenous contrast. COMPARISON:  12/28/2016 and 07/07/2016 FINDINGS: Brain: Ventricles, cisterns and other CSF spaces are within normal. There is no mass, mass effect, shift of midline structures or acute hemorrhage. No evidence of acute infarction. Vascular: Normal. Skull: Normal. Sinuses/Orbits: The orbits are normal. Subtle focal opacification over the lateral wall the right maxillary sinus. Other: None. IMPRESSION: No acute intracranial findings. Minimal chronic inflammatory change right maxillary sinus. Electronically Signed   By: Marin Olp M.D.   On: 03/16/2017 22:17   Ct Angio Chest Pe W And/or Wo Contrast  Result Date:  03/17/2017 CLINICAL DATA:  Right leg paresthesia and weakness EXAM: CT ANGIOGRAPHY CHEST WITH CONTRAST TECHNIQUE: Multidetector CT imaging of the chest was performed using the standard protocol during bolus administration of intravenous contrast. Multiplanar CT image reconstructions and MIPs were obtained to evaluate the vascular anatomy. CONTRAST:  100 cc Isovue 370 IV COMPARISON:  12/23/2016 CT FINDINGS: Cardiovascular: Satisfactory opacification of the pulmonary arteries to the proximal segmental level given patient body habitus. No evidence of pulmonary embolism. Normal heart size. No pericardial effusion. Mediastinum/Nodes: No enlarged mediastinal, hilar, or axillary lymph nodes. Thyroid gland, trachea, and esophagus demonstrate no significant findings. Lungs/Pleura: Lungs are clear. There is dependent atelectasis bilaterally. No pleural effusion or pneumothorax. Upper Abdomen: No acute abnormality. Musculoskeletal: No chest wall abnormality. No acute or significant osseous findings. Review of the MIP images confirms the above findings. IMPRESSION: 1. No acute cardiopulmonary disease. 2. No large central pulmonary embolus. 3. No aortic dissection or aneurysm.  Electronically Signed   By: Ashley Royalty M.D.   On: 03/17/2017 01:34   Mr Jodene Nam Head Wo Contrast  Result Date: 03/17/2017 CLINICAL DATA:  26 y/o  F; right arm and leg weakness. EXAM: MRI HEAD WITHOUT CONTRAST MRA HEAD WITHOUT CONTRAST MRV HEAD WITHOUT AND WITH CONTRAST TECHNIQUE: Multiplanar, multiecho pulse sequences of the brain and surrounding structures were obtained without intravenous contrast. Angiographic images of the head were obtained using MRA technique without contrast. Angiographic images were obtained of the head with MRV technique with and without intravenous contrast. COMPARISON:  03/16/2017 CT of the head. 12/28/2016 MRI and MRA of the head. CONTRAST:  20 cc MultiHance FINDINGS: MRI HEAD FINDINGS Brain: No acute infarction, hemorrhage,  hydrocephalus, extra-axial collection or mass lesion. Stable few nonspecific foci of T2 FLAIR hyperintensity are present within subcortical white matter of the frontal lobes bilaterally. No white matter lesions identified within splenium of corpus callosum, periventricular white matter, brainstem, basal ganglia, or cerebellum. Vascular: As below. Skull and upper cervical spine: Normal marrow signal. Sinuses/Orbits: Small right maxillary sinus mucous retention cyst. Otherwise no abnormal signal of the visualized paranasal sinuses or mastoid air cells. Other: None. MRA HEAD FINDINGS Internal carotid arteries:  Patent. Anterior cerebral arteries:  Patent. Middle cerebral arteries: Patent. Anterior communicating artery: Patent. Posterior communicating arteries: Probable diminutive posterior communicating arteries bilaterally. Posterior cerebral arteries:  Patent. Basilar artery:  Patent. Vertebral arteries:  Patent. No evidence of high-grade stenosis, large vessel occlusion, or aneurysm unless noted above. MRV HEAD FINDINGS Normal flow related signal on time-of-flight MRV of the superior sagittal sinus, straight sinus, internal cerebral veins, basal veins of Rosenthal, bilateral transverse sinuses, bilateral sigmoid sinuses, bilateral upper internal jugular veins, and large cortical veins. After administration of intravenous contrast there is normal enhancement of the dural venous sinuses as above. IMPRESSION: 1. No acute intracranial abnormality identified. 2. Stable nonspecific foci of T2 FLAIR hyperintensity within subcortical white matter of the frontal lobes, possibly related to migraine headaches or vasculopathy. Atypical distribution for demyelination. 3. Normal MRA of the head without contrast. 4. Normal MRV of the head with and without contrast. Electronically Signed   By: Kristine Garbe M.D.   On: 03/17/2017 03:13   Mr Brain Wo Contrast  Result Date: 03/17/2017 CLINICAL DATA:  26 y/o  F; right arm  and leg weakness. EXAM: MRI HEAD WITHOUT CONTRAST MRA HEAD WITHOUT CONTRAST MRV HEAD WITHOUT AND WITH CONTRAST TECHNIQUE: Multiplanar, multiecho pulse sequences of the brain and surrounding structures were obtained without intravenous contrast. Angiographic images of the head were obtained using MRA technique without contrast. Angiographic images were obtained of the head with MRV technique with and without intravenous contrast. COMPARISON:  03/16/2017 CT of the head. 12/28/2016 MRI and MRA of the head. CONTRAST:  20 cc MultiHance FINDINGS: MRI HEAD FINDINGS Brain: No acute infarction, hemorrhage, hydrocephalus, extra-axial collection or mass lesion. Stable few nonspecific foci of T2 FLAIR hyperintensity are present within subcortical white matter of the frontal lobes bilaterally. No white matter lesions identified within splenium of corpus callosum, periventricular white matter, brainstem, basal ganglia, or cerebellum. Vascular: As below. Skull and upper cervical spine: Normal marrow signal. Sinuses/Orbits: Small right maxillary sinus mucous retention cyst. Otherwise no abnormal signal of the visualized paranasal sinuses or mastoid air cells. Other: None. MRA HEAD FINDINGS Internal carotid arteries:  Patent. Anterior cerebral arteries:  Patent. Middle cerebral arteries: Patent. Anterior communicating artery: Patent. Posterior communicating arteries: Probable diminutive posterior communicating arteries bilaterally. Posterior cerebral arteries:  Patent. Basilar  artery:  Patent. Vertebral arteries:  Patent. No evidence of high-grade stenosis, large vessel occlusion, or aneurysm unless noted above. MRV HEAD FINDINGS Normal flow related signal on time-of-flight MRV of the superior sagittal sinus, straight sinus, internal cerebral veins, basal veins of Rosenthal, bilateral transverse sinuses, bilateral sigmoid sinuses, bilateral upper internal jugular veins, and large cortical veins. After administration of intravenous  contrast there is normal enhancement of the dural venous sinuses as above. IMPRESSION: 1. No acute intracranial abnormality identified. 2. Stable nonspecific foci of T2 FLAIR hyperintensity within subcortical white matter of the frontal lobes, possibly related to migraine headaches or vasculopathy. Atypical distribution for demyelination. 3. Normal MRA of the head without contrast. 4. Normal MRV of the head with and without contrast. Electronically Signed   By: Kristine Garbe M.D.   On: 03/17/2017 03:13   Dg Esophagus  Result Date: 03/17/2017 CLINICAL DATA:  Dysphagia EXAM: ESOPHOGRAM/BARIUM SWALLOW TECHNIQUE: Single contrast examination was performed using thin barium and barium tablet. FLUOROSCOPY TIME:  Fluoroscopy Time:  2 minutes 0 second Radiation Exposure Index (if provided by the fluoroscopic device): Number of Acquired Spot Images: 0 COMPARISON:  CT chest 03/16/2017 FINDINGS: Esophageal mucosa and motility normal. Negative for stricture or mass. No mucosal irregularity or edema. Negative for hiatal hernia. Barium tablet passed readily into the stomach without obstruction IMPRESSION: Negative Electronically Signed   By: Franchot Gallo M.D.   On: 03/17/2017 11:11   Mr Mrv Miguel Dibble MV Cm  Result Date: 03/17/2017 CLINICAL DATA:  26 y/o  F; right arm and leg weakness. EXAM: MRI HEAD WITHOUT CONTRAST MRA HEAD WITHOUT CONTRAST MRV HEAD WITHOUT AND WITH CONTRAST TECHNIQUE: Multiplanar, multiecho pulse sequences of the brain and surrounding structures were obtained without intravenous contrast. Angiographic images of the head were obtained using MRA technique without contrast. Angiographic images were obtained of the head with MRV technique with and without intravenous contrast. COMPARISON:  03/16/2017 CT of the head. 12/28/2016 MRI and MRA of the head. CONTRAST:  20 cc MultiHance FINDINGS: MRI HEAD FINDINGS Brain: No acute infarction, hemorrhage, hydrocephalus, extra-axial collection or mass lesion.  Stable few nonspecific foci of T2 FLAIR hyperintensity are present within subcortical white matter of the frontal lobes bilaterally. No white matter lesions identified within splenium of corpus callosum, periventricular white matter, brainstem, basal ganglia, or cerebellum. Vascular: As below. Skull and upper cervical spine: Normal marrow signal. Sinuses/Orbits: Small right maxillary sinus mucous retention cyst. Otherwise no abnormal signal of the visualized paranasal sinuses or mastoid air cells. Other: None. MRA HEAD FINDINGS Internal carotid arteries:  Patent. Anterior cerebral arteries:  Patent. Middle cerebral arteries: Patent. Anterior communicating artery: Patent. Posterior communicating arteries: Probable diminutive posterior communicating arteries bilaterally. Posterior cerebral arteries:  Patent. Basilar artery:  Patent. Vertebral arteries:  Patent. No evidence of high-grade stenosis, large vessel occlusion, or aneurysm unless noted above. MRV HEAD FINDINGS Normal flow related signal on time-of-flight MRV of the superior sagittal sinus, straight sinus, internal cerebral veins, basal veins of Rosenthal, bilateral transverse sinuses, bilateral sigmoid sinuses, bilateral upper internal jugular veins, and large cortical veins. After administration of intravenous contrast there is normal enhancement of the dural venous sinuses as above. IMPRESSION: 1. No acute intracranial abnormality identified. 2. Stable nonspecific foci of T2 FLAIR hyperintensity within subcortical white matter of the frontal lobes, possibly related to migraine headaches or vasculopathy. Atypical distribution for demyelination. 3. Normal MRA of the head without contrast. 4. Normal MRV of the head with and without contrast. Electronically Signed   By:  Kristine Garbe M.D.   On: 03/17/2017 03:13    echocardiogram    LV EF: 65% -   70%  ------------------------------------------------------------------- Indications:      Chest  pain 786.51.  ------------------------------------------------------------------- History:   PMH:  History of right atrial thrombus.  ------------------------------------------------------------------- Study Conclusions  - Left ventricle: The cavity size was normal. Systolic function was   vigorous. The estimated ejection fraction was in the range of 65%   to 70%. Wall motion was normal; there were no regional wall   motion abnormalities. Doppler parameters are consistent with   abnormal left ventricular relaxation (grade 1 diastolic   dysfunction). - Left atrium: Cannot exclude thrombus. - Right atrium: Cannot exclude thrombus.  Recommendations:  Consider transesophageal echocardiography if clinically indicated in order to exclude intracardiac thrombus.      Filed Weights   03/17/17 1858 03/18/17 0428 03/18/17 0725  Weight: 103.4 kg (228 lb) 105.6 kg (232 lb 12.9 oz) (P) 105.6 kg (232 lb 12.9 oz)     Microbiology: No results found for this or any previous visit (from the past 240 hour(s)).     Blood Culture    Component Value Date/Time   SDES URINE, RANDOM 07/08/2016 0410   SPECREQUEST NONE 07/08/2016 0410   CULT MULTIPLE SPECIES PRESENT, SUGGEST RECOLLECTION (A) 07/08/2016 0410   REPTSTATUS 07/09/2016 FINAL 07/08/2016 0410      Labs: Results for orders placed or performed during the hospital encounter of 03/16/17 (from the past 48 hour(s))  Protime-INR     Status: None   Collection Time: 03/16/17  9:38 PM  Result Value Ref Range   Prothrombin Time 12.9 11.4 - 15.2 seconds   INR 0.97   APTT     Status: None   Collection Time: 03/16/17  9:38 PM  Result Value Ref Range   aPTT 26 24 - 36 seconds  CBC     Status: Abnormal   Collection Time: 03/16/17  9:38 PM  Result Value Ref Range   WBC 9.4 4.0 - 10.5 K/uL   RBC 5.11 3.87 - 5.11 MIL/uL   Hemoglobin 14.8 12.0 - 15.0 g/dL   HCT 43.7 36.0 - 46.0 %   MCV 85.5 78.0 - 100.0 fL   MCH 29.0 26.0 - 34.0 pg    MCHC 33.9 30.0 - 36.0 g/dL   RDW 13.5 11.5 - 15.5 %   Platelets 402 (H) 150 - 400 K/uL  Differential     Status: None   Collection Time: 03/16/17  9:38 PM  Result Value Ref Range   Neutrophils Relative % 54 %   Neutro Abs 5.0 1.7 - 7.7 K/uL   Lymphocytes Relative 38 %   Lymphs Abs 3.6 0.7 - 4.0 K/uL   Monocytes Relative 7 %   Monocytes Absolute 0.7 0.1 - 1.0 K/uL   Eosinophils Relative 1 %   Eosinophils Absolute 0.1 0.0 - 0.7 K/uL   Basophils Relative 0 %   Basophils Absolute 0.0 0.0 - 0.1 K/uL  Comprehensive metabolic panel     Status: Abnormal   Collection Time: 03/16/17  9:38 PM  Result Value Ref Range   Sodium 133 (L) 135 - 145 mmol/L   Potassium 3.7 3.5 - 5.1 mmol/L   Chloride 104 101 - 111 mmol/L   CO2 18 (L) 22 - 32 mmol/L   Glucose, Bld 96 65 - 99 mg/dL   BUN 11 6 - 20 mg/dL   Creatinine, Ser 0.73 0.44 - 1.00 mg/dL   Calcium 8.8 (L)  8.9 - 10.3 mg/dL   Total Protein 7.8 6.5 - 8.1 g/dL   Albumin 3.4 (L) 3.5 - 5.0 g/dL   AST 17 15 - 41 U/L   ALT 9 (L) 14 - 54 U/L   Alkaline Phosphatase 55 38 - 126 U/L   Total Bilirubin 0.3 0.3 - 1.2 mg/dL   GFR calc non Af Amer >60 >60 mL/min   GFR calc Af Amer >60 >60 mL/min    Comment: (NOTE) The eGFR has been calculated using the CKD EPI equation. This calculation has not been validated in all clinical situations. eGFR's persistently <60 mL/min signify possible Chronic Kidney Disease.    Anion gap 11 5 - 15  I-stat troponin, ED     Status: None   Collection Time: 03/16/17  9:52 PM  Result Value Ref Range   Troponin i, poc 0.00 0.00 - 0.08 ng/mL   Comment 3            Comment: Due to the release kinetics of cTnI, a negative result within the first hours of the onset of symptoms does not rule out myocardial infarction with certainty. If myocardial infarction is still suspected, repeat the test at appropriate intervals.   I-Stat Chem 8, ED     Status: Abnormal   Collection Time: 03/16/17  9:54 PM  Result Value Ref Range    Sodium 135 135 - 145 mmol/L   Potassium 3.6 3.5 - 5.1 mmol/L   Chloride 105 101 - 111 mmol/L   BUN 13 6 - 20 mg/dL   Creatinine, Ser 0.70 0.44 - 1.00 mg/dL   Glucose, Bld 100 (H) 65 - 99 mg/dL   Calcium, Ion 0.94 (L) 1.15 - 1.40 mmol/L   TCO2 19 0 - 100 mmol/L   Hemoglobin 16.0 (H) 12.0 - 15.0 g/dL   HCT 47.0 (H) 36.0 - 46.0 %  Urinalysis, Routine w reflex microscopic     Status: Abnormal   Collection Time: 03/16/17 10:35 PM  Result Value Ref Range   Color, Urine YELLOW YELLOW   APPearance HAZY (A) CLEAR   Specific Gravity, Urine 1.014 1.005 - 1.030   pH 5.0 5.0 - 8.0   Glucose, UA NEGATIVE NEGATIVE mg/dL   Hgb urine dipstick NEGATIVE NEGATIVE   Bilirubin Urine NEGATIVE NEGATIVE   Ketones, ur NEGATIVE NEGATIVE mg/dL   Protein, ur NEGATIVE NEGATIVE mg/dL   Nitrite NEGATIVE NEGATIVE   Leukocytes, UA LARGE (A) NEGATIVE   RBC / HPF 0-5 0 - 5 RBC/hpf   WBC, UA 6-30 0 - 5 WBC/hpf   Bacteria, UA FEW (A) NONE SEEN   Squamous Epithelial / LPF 0-5 (A) NONE SEEN   Mucous PRESENT   Pregnancy, urine     Status: None   Collection Time: 03/16/17 10:35 PM  Result Value Ref Range   Preg Test, Ur NEGATIVE NEGATIVE    Comment:        THE SENSITIVITY OF THIS METHODOLOGY IS >20 mIU/mL.   Urine rapid drug screen (hosp performed)     Status: None   Collection Time: 03/16/17 10:35 PM  Result Value Ref Range   Opiates NONE DETECTED NONE DETECTED   Cocaine NONE DETECTED NONE DETECTED   Benzodiazepines NONE DETECTED NONE DETECTED   Amphetamines NONE DETECTED NONE DETECTED   Tetrahydrocannabinol NONE DETECTED NONE DETECTED   Barbiturates NONE DETECTED NONE DETECTED    Comment:        DRUG SCREEN FOR MEDICAL PURPOSES ONLY.  IF CONFIRMATION IS NEEDED FOR ANY PURPOSE,  NOTIFY LAB WITHIN 5 DAYS.        LOWEST DETECTABLE LIMITS FOR URINE DRUG SCREEN Drug Class       Cutoff (ng/mL) Amphetamine      1000 Barbiturate      200 Benzodiazepine   131 Tricyclics       438 Opiates           300 Cocaine          300 THC              50   CBG monitoring, ED     Status: Abnormal   Collection Time: 03/16/17 10:36 PM  Result Value Ref Range   Glucose-Capillary 106 (H) 65 - 99 mg/dL  Brain natriuretic peptide     Status: None   Collection Time: 03/17/17  5:54 AM  Result Value Ref Range   B Natriuretic Peptide 9.3 0.0 - 100.0 pg/mL  Troponin I (q 6hr x 3)     Status: None   Collection Time: 03/17/17  5:54 AM  Result Value Ref Range   Troponin I <0.03 <0.03 ng/mL  HIV antibody (Routine Testing)     Status: None   Collection Time: 03/17/17  5:54 AM  Result Value Ref Range   HIV Screen 4th Generation wRfx Non Reactive Non Reactive    Comment: (NOTE) Performed At: [facility] [address] [place], [place] [id number] Lindon Romp MD AS:[id number]   Lipase, blood     Status: None   Collection Time: 03/17/17  5:54 AM  Result Value Ref Range   Lipase 15 11 - 51 U/L  Heparin level (unfractionated)     Status: None   Collection Time: 03/17/17 12:37 PM  Result Value Ref Range   Heparin Unfractionated 0.47 0.30 - 0.70 IU/mL    Comment:        IF HEPARIN RESULTS ARE BELOW EXPECTED VALUES, AND PATIENT DOSAGE HAS BEEN CONFIRMED, SUGGEST FOLLOW UP TESTING OF ANTITHROMBIN III LEVELS.   Troponin I (q 6hr x 3)     Status: None   Collection Time: 03/17/17 12:37 PM  Result Value Ref Range   Troponin I <0.03 <0.03 ng/mL  Troponin I (q 6hr x 3)     Status: None   Collection Time: 03/17/17  7:05 PM  Result Value Ref Range   Troponin I <0.03 <0.03 ng/mL  Heparin level (unfractionated)     Status: Abnormal   Collection Time: 03/17/17  7:05 PM  Result Value Ref Range   Heparin Unfractionated 0.27 (L) 0.30 - 0.70 IU/mL    Comment:        IF HEPARIN RESULTS ARE BELOW EXPECTED VALUES, AND PATIENT DOSAGE HAS BEEN CONFIRMED, SUGGEST FOLLOW UP TESTING OF ANTITHROMBIN III LEVELS.   CBC     Status: None   Collection Time: 03/18/17  4:06 AM  Result Value Ref Range    WBC 6.9 4.0 - 10.5 K/uL   RBC 4.75 3.87 - 5.11 MIL/uL   Hemoglobin 13.3 12.0 - 15.0 g/dL   HCT 41.2 36.0 - 46.0 %   MCV 86.7 78.0 - 100.0 fL   MCH 28.0 26.0 - 34.0 pg   MCHC 32.3 30.0 - 36.0 g/dL   RDW 13.6 11.5 - 15.5 %   Platelets 318 150 - 400 K/uL  Comprehensive metabolic panel     Status: Abnormal   Collection Time: 03/18/17  4:06 AM  Result Value Ref Range   Sodium 138 135 - 145 mmol/L  Potassium 4.4 3.5 - 5.1 mmol/L    Comment: NO VISIBLE HEMOLYSIS   Chloride 106 101 - 111 mmol/L   CO2 23 22 - 32 mmol/L   Glucose, Bld 94 65 - 99 mg/dL   BUN 5 (L) 6 - 20 mg/dL   Creatinine, Ser 0.69 0.44 - 1.00 mg/dL   Calcium 8.8 (L) 8.9 - 10.3 mg/dL   Total Protein 6.4 (L) 6.5 - 8.1 g/dL   Albumin 3.3 (L) 3.5 - 5.0 g/dL   AST 16 15 - 41 U/L   ALT 8 (L) 14 - 54 U/L   Alkaline Phosphatase 50 38 - 126 U/L   Total Bilirubin 0.2 (L) 0.3 - 1.2 mg/dL   GFR calc non Af Amer >60 >60 mL/min   GFR calc Af Amer >60 >60 mL/min    Comment: (NOTE) The eGFR has been calculated using the CKD EPI equation. This calculation has not been validated in all clinical situations. eGFR's persistently <60 mL/min signify possible Chronic Kidney Disease.    Anion gap 9 5 - 15  Heparin level (unfractionated)     Status: None   Collection Time: 03/18/17  4:06 AM  Result Value Ref Range   Heparin Unfractionated 0.41 0.30 - 0.70 IU/mL    Comment:        IF HEPARIN RESULTS ARE BELOW EXPECTED VALUES, AND PATIENT DOSAGE HAS BEEN CONFIRMED, SUGGEST FOLLOW UP TESTING OF ANTITHROMBIN III LEVELS.   Lipid panel     Status: Abnormal   Collection Time: 03/18/17  4:06 AM  Result Value Ref Range   Cholesterol 176 0 - 200 mg/dL   Triglycerides 239 (H) <150 mg/dL   HDL 36 (L) >40 mg/dL   Total CHOL/HDL Ratio 4.9 RATIO   VLDL 48 (H) 0 - 40 mg/dL   LDL Cholesterol 92 0 - 99 mg/dL    Comment:        Total Cholesterol/HDL:CHD Risk Coronary Heart Disease Risk Table                     Men   Women  1/2 Average Risk    3.4   3.3  Average Risk       5.0   4.4  2 X Average Risk   9.6   7.1  3 X Average Risk  23.4   11.0        Use the calculated Patient Ratio above and the CHD Risk Table to determine the patient's CHD Risk.        ATP III CLASSIFICATION (LDL):  <100     mg/dL   Optimal  100-129  mg/dL   Near or Above                    Optimal  130-159  mg/dL   Borderline  160-189  mg/dL   High  >190     mg/dL   Very High      Lipid Panel     Component Value Date/Time   CHOL 176 03/18/2017 0406   TRIG 239 (H) 03/18/2017 0406   HDL 36 (L) 03/18/2017 0406   CHOLHDL 4.9 03/18/2017 0406   VLDL 48 (H) 03/18/2017 0406   LDLCALC 92 03/18/2017 0406     Lab Results  Component Value Date   HGBA1C 5.5 07/08/2016       HPI :*  26 y.o.femalewith medical history significant of hyperlipidemia, asthma, depression, PTSD, clotting disorder, TIA, CAD, thrombosis of right atrium, medication noncompliance  to Eliquis, who presents withnumbness and weakness to her right leg, with inability to apply weight to her leg, in addition to RUE weakness and numbness.  and chest pain and shortness of breath. She had beenstarted on Eliquis in 2014 after surgical removal of the right atrial thrombus, but then stopped it in 2016 due to change of insurance and financial issues. She presented again to [facility] in late July 2017 with symptoms similar to today's presentation. TEE obtained during that admission on8/1/17 showed recurrence of right atrial thrombus: The atrium was mildly dilated. There was an apparent, small, irregular, highly mobile thrombus in the atrialcavity and appendage. There was an apparent, medium-sized fixed thrombus in the lateral atrial cavity. Patient has been noncompliant with eliquis   HOSPITAL COURSE:  Right-sided weakness CT head shows no intracranial abnormality.  MRI brain shows no acute intracranial abnormality. Stable nonspecific foci of T2 FLAIR hyperintensity within subcortical white  matter of the frontal lobes are noted, possibly related to migraine headaches or vasculopathy; the hyperintensities are minimal to mild in extent.  Normal MRA of the head without contrast. Normal MRV of the head with and without contrast. EKG EKG reveals sinus tachycardia. No atrial fibrillation. CT angiogram of chest is negative for PE or dissection. Right-sided weakness TTE to evaluate for progression versus resolution of right atrial thrombus. Results as above TEE recommended by cardiology Initially placed on heparin drip pending TEE Eliquis resumed prior to discharge Case management consulted to evaluate for cost issues and to make sure that patient has PCP follow-up Frequent neuro checks , remained stable Carotid ultrasound-negative for stenosis Bilateral lower extremity Doppler negative for DVT Case management consult regarding insurance and inability to afford Eliquis prescription.     Chest pain: Likely musculoskeletal.  CT angiogram of chest is negative for PE or dissection. Will need to rule out mural thrombosis and ACS. Troponin-I was normal and EKG showed sinus tachycardia. - prn percocet for pain - 2d echo--> results as above, essentially negative, pending TEE.  -Patient evaluated by Dr Doylene Canard, TEE in am  Triglycerides 239, LDL 92  FMB:WGYK LDL was 113 on 07/08/16, goal should be <70. Was on Lipitor, but not taking it. Triglycerides 239, LDL 92  Depression:Stable, no suicidal or homicidal ideations. -Continue home medications: Vilazodone  UTI (urinary tract infection): doubt patient has UTI. She does not have symptoms of UTI. Will discontinue -IV aztreonam    Dysphagia Patient complains of difficulty swallowing Negative   barium esophagogram,  patient started on PPI and Carafate for reflux     Discharge Exam:  Blood pressure (!) 102/44, pulse (P) 95, temperature (P) 98.5 F (36.9 C), temperature source (P) Oral, resp. rate (P) 17, height (P) _0   (1.6 m), weight (P) 105.6 kg (232 lb 12.9 oz), last menstrual period 02/14/2017, SpO2 96 %.  Cardiac: S1/S2, RRR, No murmurs, No gallops or rubs. Respiratory: No rales, wheezing, rhonchi or rubs. GI: Soft, nondistended, nontender, no rebound pain, no organomegaly, BS present. GU: No hematuria Ext: No pitting leg edema bilaterally. 2+DP/PT pulse bilaterally. Has right calf tenderness Musculoskeletal: No joint deformities, No joint redness or warmth, no limitation of ROM in spin. Skin: No rashes.  Neuro: Alert, oriented X3, cranial nerves II-XII grossly intact. Muscle strength 0/5 in right leg and 2/5 in right arm. Sensation to light touch intact is decreased in right side.       SignedReyne Dumas 03/18/2017, 7:49 AM        Time spent >45 mins

## 2017-03-18 NOTE — Progress Notes (Signed)
Pt refusing further use of bed alarm.

## 2017-03-18 NOTE — Progress Notes (Signed)
Occupational Therapy Evaluation Patient Details Name: [name] MRN: [id number] DOB: August 29, 1991 Today's Date: 03/18/2017    History of Present Illness 26 y.o.female who presents with right-sided weakness and chest pain and shortness of breath. CT, MRI, and MRA negative. PMH: asthma, depression, PTSD, clotting disorder, TIA, CAD, thrombosis of right atrium, cardiac surgery.    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 Pt with apparent inconsistent performance throughout assessment on her ability to use her RU/LE. Pt dragging RLE behind her while walking, however could use RLE [facility] when transferring on/off toilet and completing pericare/clothing management. Pt complains of general R shoulder pain in upper trap and ant/posterior shoulder. Pt states "I think I hurt it at work". Pt asked multiple times about her proper dosage of Percocet and states she needs to "talk with the doctor to get the right dosage". Given pt's performance, she would benefit from a 3in1 to use as a shower chair. Will follow acutely to address established goals.  Given inconsistent performance, recommend Psych Consult.     Follow Up Recommendations  No OT follow up;Supervision - Intermittent    Equipment Recommendations  3 in 1 bedside commode (to use as shower chair)    Recommendations for Other Services  (Psych consult)     Precautions / Restrictions Precautions Precautions: Fall Precaution Comments: dragging R LE Restrictions Weight Bearing Restrictions: No      Mobility Bed Mobility Overal bed mobility: Modified Independent             General bed mobility comments: Lifts RLE with arms  Transfers Overall transfer level: Needs assistance Equipment used: Rolling walker (2 wheeled) Transfers: Sit to/from Stand Sit to Stand: Supervision         General transfer comment: pt using bilateral UEs at rw for standing.      Balance Overall balance assessment: Needs assistance Sitting-balance support: No upper extremity supported Sitting balance-Leahy Scale: Good     Standing balance support: Bilateral upper extremity supported;Single extremity supported Standing balance-Leahy Scale: Fair Standing balance comment: able to stand at sink without UE support                           ADL either performed or assessed with clinical judgement   ADL Overall ADL's : Needs assistance/impaired Eating/Feeding: Independent   Grooming: Modified independent;Standing   Upper Body Bathing: Modified independent;Standing   Lower Body Bathing: Supervison/ safety;Set up;Sit to/from stand   Upper Body Dressing : Set up   Lower Body Dressing: Set up;Sit to/from stand   Toilet Transfer: Supervision/safety;Ambulation;RW;Regular Toilet   Toileting- Architect and Hygiene: Sit to/from stand;Supervision/safety       Functional mobility during ADLs: Supervision/safety;Rolling walker General ADL Comments: Inconsistent use of RLE during functional tasks. Pt unable to lift RLE off bed but able to squat and move leg during ADL. Pt asking about changing pain med dosage     Vision Baseline Vision/History: Wears glasses Patient Visual Report: No change from baseline       Perception     Praxis      Pertinent Vitals/Pain Pain Assessment: 0-10 Pain Score: 9  Faces Pain Scale: Hurts little more Pain Location: Rt LE/ shoulder Pain Descriptors / Indicators: Aching;Burning;Constant (hurts like when it's out in the cold too long) Pain Intervention(s): Limited activity within patient's tolerance     Hand Dominance Right  Extremity/Trunk Assessment Upper Extremity Assessment Upper Extremity Assessment: RUE deficits/detail RUE Deficits / Details: able to use for transfers and using rw during gait and for functional tasks, i.e. UB dressing, but has difficultylifting Rshoulder past 70 degrees RUE  Sensation:  (reports of buring sensation lateral aspect of RUE)   Lower Extremity Assessment Lower Extremity Assessment: Defer to PT evaluation RLE Deficits / Details: Pt with inconsistent movement RLE. Unable tolift off bed or hold knee in flexion on bed, however, able to fully weight bear through RLE and squat during pericare RLE Sensation: decreased light touch   Cervical / Trunk Assessment Cervical / Trunk Assessment: Normal   Communication Communication Communication: No difficulties   Cognition Arousal/Alertness: Awake/alert Behavior During Therapy: WFL for tasks assessed/performed Overall Cognitive Status: Within Functional Limits for tasks assessed                                 General Comments: Pt states "Im really OCD"   General Comments       Exercises Exercises: Other exercises Other Exercises Other Exercises: Heat placed on upper trap   Shoulder Instructions      Home Living Family/patient expects to be discharged to:: Private residence Living Arrangements: Alone Available Help at Discharge: Friend(s);Available PRN/intermittently Type of Home: Mobile home Home Access: Stairs to enter Entrance Stairs-Number of Steps: 4 Entrance Stairs-Rails: None Home Layout: One level     Bathroom Shower/Tub: IT trainer: Standard Bathroom Accessibility: Yes How Accessible: Accessible via walker Home Equipment: Walker - 2 wheels   Additional Comments: Has a friend who can check on her.       Prior Functioning/Environment Level of Independence: Independent        Comments:  at snf        OT Problem List: Decreased strength;Decreased range of motion;Impaired balance (sitting and/or standing);Decreased knowledge of use of DME or AE;Impaired sensation;Obesity;Pain      OT Treatment/Interventions: Self-care/ADL training;Therapeutic exercise;Neuromuscular education;DME and/or AE instruction;Therapeutic  activities;Patient/family education;Balance training    OT Goals(Current goals can be found in the care plan section) Acute Rehab OT Goals Patient Stated Goal: to get her regular dosage of pain medicine OT Goal Formulation: With patient Time For Goal Achievement: 04/01/17 Potential to Achieve Goals: Good  OT Frequency: Min 2X/week   Barriers to D/C:            Co-evaluation              End of Session Equipment Utilized During Treatment: Gait belt;Rolling walker Nurse Communication: Mobility status  Activity Tolerance: Patient tolerated treatment well Patient left: in bed;with call bell/phone within reach  OT Visit Diagnosis: Unsteadiness on feet (R26.81);Muscle weakness (generalized) (M62.81);Pain Pain - Right/Left: Right Pain - part of body: Shoulder;Leg                Time: 6962-9528 OT Time Calculation (min): 25 min Charges:  OT General Charges $OT Visit: 1 Procedure OT Evaluation $OT Eval Moderate Complexity: 1 Procedure OT Treatments $Self Care/Home Management : 8-22 mins G-Codes:     [facility], OT/L  [phone] 03/18/2017  Aceton Kinnear,HILLARY 03/18/2017, 1:27 PM

## 2017-03-18 NOTE — Progress Notes (Signed)
During pre-procedure for TEE, patient stated she needed to be "heavily sedated" for the procedure and that she did not want to proceed if it was the same type of sedation as last time. Dr. Algie Coffer notified. Unable to get anesthesia for today, so procedure will be rescheduled for tomorrow.  Bedside nurse notified, pt transported back to room  Roselie Awkward, RN

## 2017-03-18 NOTE — Evaluation (Signed)
Clinical/Bedside Swallow Evaluation Patient Details  Name: [name] MRN: [id number] Date of Birth: 04/17/1991  Today's Date: 03/18/2017 Time: SLP Start Time (ACUTE ONLY): 1138 SLP Stop Time (ACUTE ONLY): 1154 SLP Time Calculation (min) (ACUTE ONLY): 16 min  Past Medical History:  Past Medical History:  Diagnosis Date  . Asthma   . Coronary artery disease    clotting disorder  . Depression   . PTSD (post-traumatic stress disorder)   . Thrombosis of right atrium   . Tobacco abuse    Past Surgical History:  Past Surgical History:  Procedure Laterality Date  . CARDIAC SURGERY    . CHOLECYSTECTOMY    . TEE WITHOUT CARDIOVERSION N/A 07/09/2016   Procedure: TRANSESOPHAGEAL ECHOCARDIOGRAM (TEE);  Surgeon: Orpah Cobb, MD;  Location: [facility] ENDOSCOPY;  Service: Cardiovascular;  Laterality: N/A;   HPI:  26 y.o.female who presents with right-sided weakness and chest pain and shortness of breath. CT, MRI, and MRA negative. Pt was seen by SLP in July 2017 with pt reporting subjective c/o difficulty swallowing. MBS indicated oropharyngeal swallow function WFL. Esophagram completed 03/17/17 was "negative". PMH: asthma, depression, PTSD, clotting disorder, TIA, CAD, thrombosis of right atrium, cardiac surgery.   Assessment / Plan / Recommendation Clinical Impression  Pt has c/o difficulty swallowing pills and harder solids, similar to presentation during previous MBS at which time her oropharyngeal swallow is [facility]. Esophageal issues were questioned at that time, but barium swallow on previous date was negative for any findings. Pt does have frequent throat clearing with solids, but her swallow appears to occur swiftly and her voice remains clear. Given the above, including her prior instrumental testing, recommend that she continue with a regular diet and thin liquids with use of liquid washes PRN. Would attempt meds whole in puree to facilitate swallow. SLP also encouraged pt to select softer foods  from menu per her comfort level.  SLP Visit Diagnosis: Dysphagia, unspecified (R13.10)    Aspiration Risk  Mild aspiration risk    Diet Recommendation Regular;Thin liquid   Liquid Administration via: Cup;Straw Medication Administration: Whole meds with puree Supervision: Patient able to self feed;Intermittent supervision to cue for compensatory strategies Compensations: Slow rate;Small sips/bites;Follow solids with liquid Postural Changes: Seated upright at 90 degrees;Remain upright for at least 30 minutes after po intake    Other  Recommendations Oral Care Recommendations: Oral care BID   Follow up Recommendations None      Frequency and Duration min 1 x/week  1 week       Prognosis Prognosis for Safe Diet Advancement: Good      Swallow Study   General HPI: 26 y.o.female who presents with right-sided weakness and chest pain and shortness of breath. CT, MRI, and MRA negative. Pt was seen by SLP in July 2017 with pt reporting subjective c/o difficulty swallowing. MBS indicated oropharyngeal swallow function WFL. Esophagram completed 03/17/17 was "negative". PMH: asthma, depression, PTSD, clotting disorder, TIA, CAD, thrombosis of right atrium, cardiac surgery. Type of Study: Bedside Swallow Evaluation Previous Swallow Assessment: see HPI Diet Prior to this Study: Regular;Thin liquids Temperature Spikes Noted: No Respiratory Status: Room air History of Recent Intubation: No Behavior/Cognition: Alert;Cooperative Oral Cavity - Dentition: Adequate natural dentition Vision: Functional for self-feeding Self-Feeding Abilities: Able to feed self Patient Positioning: Upright in bed Baseline Vocal Quality: Normal    Oral/Motor/Sensory Function Overall Oral Motor/Sensory Function: Within functional limits (no obvious focal weakness)   Ice Chips Ice chips: Not tested   Thin Liquid Thin Liquid: Within functional limits  Presentation: Self Fed;Straw    Nectar Thick Nectar Thick Liquid:  Not tested   Honey Thick Honey Thick Liquid: Not tested   Puree Puree: Not tested (pt declined)   Solid   GO   Solid: Impaired Presentation: Self Fed Pharyngeal Phase Impairments: Throat Clearing - Immediate;Throat Clearing - Delayed        [name] 03/18/2017,12:20 PM   [name], M.A. CCC-SLP [phone]

## 2017-03-18 NOTE — Plan of Care (Signed)
Problem: Safety: Goal: Ability to remain free from injury will improve Outcome: Progressing Using walker in room. Reminded pt to call for assistance.

## 2017-03-18 NOTE — Discharge Instructions (Signed)
Information on my medicine - ELIQUIS® (apixaban) ° °This medication education was reviewed with me or my healthcare representative as part of my discharge preparation.   °Why was Eliquis® prescribed for you? °Eliquis® was prescribed for you to reduce the risk of forming blood clots that can cause a stroke if you have a medical condition called atrial fibrillation (a type of irregular heartbeat) OR to reduce the risk of a blood clots forming after orthopedic surgery. ° °What do You need to know about Eliquis® ? °Take your Eliquis® 5mg TWICE DAILY - one tablet in the morning and one tablet in the evening with or without food.  It would be best to take the doses about the same time each day. ° °If you have difficulty swallowing the tablet whole please discuss with your pharmacist how to take the medication safely. ° °Take Eliquis® exactly as prescribed by your doctor and DO NOT stop taking Eliquis® without talking to the doctor who prescribed the medication.  Stopping may increase your risk of developing a new clot or stroke.  Refill your prescription before you run out. ° °After discharge, you should have regular check-up appointments with your healthcare provider that is prescribing your Eliquis®.  In the future your dose may need to be changed if your kidney function or weight changes by a significant amount or as you get older. ° °What do you do if you miss a dose? °If you miss a dose, take it as soon as you remember on the same day and resume taking twice daily.  Do not take more than one dose of ELIQUIS at the same time. ° °Important Safety Information °A possible side effect of Eliquis® is bleeding. You should call your healthcare provider right away if you experience any of the following: °? Bleeding from an injury or your nose that does not stop. °? Unusual colored urine (red or dark brown) or unusual colored stools (red or black). °? Unusual bruising for unknown reasons. °? A serious fall or if you hit your  head (even if there is no bleeding). ° °Some medicines may interact with Eliquis® and might increase your risk of bleeding or clotting while on Eliquis®. To help avoid this, consult your healthcare provider or pharmacist prior to using any new prescription or non-prescription medications, including herbals, vitamins, non-steroidal anti-inflammatory drugs (NSAIDs) and supplements. ° °This website has more information on Eliquis® (apixaban): www.Eliquis.com. ° ° ° °

## 2017-03-18 NOTE — Progress Notes (Signed)
ANTICOAGULATION/ANTIBIOTIC CONSULT NOTE - Initial Consult  Pharmacy Consult for Eliquis Indication: h/o atrial thrombus  Allergies  Allergen Reactions  . Fluoxetine Other (See Comments)    Pt told not to take with eliquis  . Ceftriaxone Itching and Other (See Comments)    Red man syndrome  . Gabapentin Other (See Comments)    Confusion/ chest pains  . Morphine And Related Itching and Nausea And Vomiting  . Nsaids Other (See Comments)    Pt told by hematologist not to take  . Topamax [Topiramate] Other (See Comments)    Dizziness, black out spells  . Toradol [Ketorolac Tromethamine] Itching and Other (See Comments)    Felt hot  . Tramadol     Flushed   . Vicodin [Hydrocodone-Acetaminophen] Other (See Comments)    hallucinations  . Depakote [Divalproex Sodium] Rash and Other (See Comments)    Skin felt like it was on fire  . Methocarbamol Rash  . Metoclopramide Rash and Hives    Patient Measurements: Height:  (160 cm) Weight: 232 lb 12.9 oz (105.6 kg) IBW/kg (Calculated) : 52.4 Heparin Dosing Weight: 76 kg  Vital Signs: Temp: 98.3 F (36.8 C) (04/10 0813) Temp Source: Oral (04/10 0813) BP: 95/35 (04/10 0813) Pulse Rate: 89 (04/10 0813)  Labs:  Recent Labs  03/16/17 2138 03/16/17 2154 03/17/17 0554 03/17/17 1237 03/17/17 1905 03/18/17 0406  HGB 14.8 16.0*  --   --   --  13.3  HCT 43.7 47.0*  --   --   --  41.2  PLT 402*  --   --   --   --  318  APTT 26  --   --   --   --   --   LABPROT 12.9  --   --   --   --   --   INR 0.97  --   --   --   --   --   HEPARINUNFRC  --   --   --  0.47 0.27* 0.41  CREATININE 0.73 0.70  --   --   --  0.69  TROPONINI  --   --  <0.03 <0.03 <0.03  --     Estimated Creatinine Clearance: 122.9 mL/min (by C-G formula based on SCr of 0.69 mg/dL).   Medical History: Past Medical History:  Diagnosis Date  . Asthma   . Coronary artery disease    clotting disorder  . Depression   . PTSD (post-traumatic stress disorder)    . Thrombosis of right atrium   . Tobacco abuse     Medications:  See electronic med rec  Assessment: 26 y.o. F with hx of R atrial thrombus (supposed to be on apixaban PTA, but hasn't taken in 2 months). She was started on IV heparin, now to transition to Eliquis. CBC stable and wnl.   Goal of Therapy:  Monitor platelets by anticoagulation protocol: Yes   Plan:  Eliquis  BID since it is an old atrial thrombus. Monitor CBC, f/u TEE D/c heparin at 1000 when Eliquis is given.  Bayard Hugger, PharmD, BCPS  Clinical Pharmacist  Pager: [phone]   03/18/2017,10:33 AM

## 2017-03-18 NOTE — Progress Notes (Signed)
ANTICOAGULATION CONSULT NOTE - Follow Up Consult  Pharmacy Consult for heparin Indication: h/o arterial thrombus  Labs:  Recent Labs  03/16/17 2138 03/16/17 2154 03/17/17 0554 03/17/17 1237 03/17/17 1905 03/18/17 0406  HGB 14.8 16.0*  --   --   --  13.3  HCT 43.7 47.0*  --   --   --  41.2  PLT 402*  --   --   --   --  318  APTT 26  --   --   --   --   --   LABPROT 12.9  --   --   --   --   --   INR 0.97  --   --   --   --   --   HEPARINUNFRC  --   --   --  0.47 0.27* 0.41  CREATININE 0.73 0.70  --   --   --   --   TROPONINI  --   --  <0.03 <0.03 <0.03  --     Assessment/Plan:  25yo female therapeutic on heparin after rate change. Will continue gtt at current rate and confirm stable with additional level.   Vernard Gambles, PharmD, BCPS  03/18/2017,4:38 AM

## 2017-03-19 ENCOUNTER — Encounter (HOSPITAL_COMMUNITY): Payer: Self-pay | Admitting: Certified Registered Nurse Anesthetist

## 2017-03-19 ENCOUNTER — Inpatient Hospital Stay (HOSPITAL_COMMUNITY): Payer: Medicaid Other

## 2017-03-19 ENCOUNTER — Encounter (HOSPITAL_COMMUNITY)
Admission: EM | Disposition: A | Discharge status: Home / Self Care | Payer: Self-pay | Source: Home / Self Care | Attending: Internal Medicine

## 2017-03-19 ENCOUNTER — Inpatient Hospital Stay (HOSPITAL_COMMUNITY): Payer: Medicaid Other | Admitting: Certified Registered Nurse Anesthetist

## 2017-03-19 DIAGNOSIS — M79662 Pain in left lower leg: Secondary | ICD-10-CM

## 2017-03-19 HISTORY — PX: TEE WITHOUT CARDIOVERSION: SHX5443

## 2017-03-19 LAB — HEMOGLOBIN A1C
Hgb A1c MFr Bld: 5.3 % (ref 4.8–5.6)
Mean Plasma Glucose: 105 mg/dL

## 2017-03-19 LAB — CBC
HEMATOCRIT: 41.1 % (ref 36.0–46.0)
Hemoglobin: 13.2 g/dL (ref 12.0–15.0)
MCH: 28 pg (ref 26.0–34.0)
MCHC: 32.1 g/dL (ref 30.0–36.0)
MCV: 87.1 fL (ref 78.0–100.0)
Platelets: 330 10*3/uL (ref 150–400)
RBC: 4.72 MIL/uL (ref 3.87–5.11)
RDW: 13.5 % (ref 11.5–15.5)
WBC: 5.8 10*3/uL (ref 4.0–10.5)

## 2017-03-19 SURGERY — ECHOCARDIOGRAM, TRANSESOPHAGEAL
Anesthesia: Monitor Anesthesia Care

## 2017-03-19 MED ORDER — LORAZEPAM 2 MG/ML IJ SOLN
1.0000 mg | Freq: Once | INTRAMUSCULAR | Status: AC
  Administered 2017-03-19: 1 mg via INTRAVENOUS

## 2017-03-19 MED ORDER — PROPOFOL 10 MG/ML IV BOLUS
INTRAVENOUS | Status: DC | PRN
  Administered 2017-03-19 (×2): 20 mg via INTRAVENOUS

## 2017-03-19 MED ORDER — MIDAZOLAM HCL 2 MG/2ML IJ SOLN
INTRAMUSCULAR | Status: DC | PRN
Start: 2017-03-19 — End: 2017-03-19
  Administered 2017-03-19: 2 mg via INTRAVENOUS

## 2017-03-19 MED ORDER — APIXABAN 5 MG PO TABS: 5.0000 mg | ORAL_TABLET | Freq: Two times a day (BID) | ORAL | 1 refills | Status: AC

## 2017-03-19 MED ORDER — APIXABAN 5 MG PO TABS: 5.0000 mg | ORAL_TABLET | Freq: Two times a day (BID) | ORAL | 1 refills | Status: DC

## 2017-03-19 MED ORDER — PROPOFOL 500 MG/50ML IV EMUL
INTRAVENOUS | Status: DC | PRN
  Administered 2017-03-19: 100 ug/kg/min via INTRAVENOUS

## 2017-03-19 MED ORDER — SODIUM CHLORIDE 0.9 % IV SOLN: INTRAVENOUS | Status: DC

## 2017-03-19 MED ORDER — ATORVASTATIN CALCIUM 10 MG PO TABS: 10.0000 mg | ORAL_TABLET | Freq: Every day | ORAL | Status: DC

## 2017-03-19 MED ORDER — LORAZEPAM 2 MG/ML IJ SOLN
INTRAMUSCULAR | Status: AC
  Filled 2017-03-19: qty 1

## 2017-03-19 MED ORDER — MIDAZOLAM HCL 2 MG/2ML IJ SOLN
INTRAMUSCULAR | Status: AC
  Filled 2017-03-19: qty 2

## 2017-03-19 MED ORDER — APIXABAN 5 MG PO TABS: 10.0000 mg | ORAL_TABLET | Freq: Two times a day (BID) | ORAL | 0 refills | Status: DC

## 2017-03-19 MED ORDER — BUTAMBEN-TETRACAINE-BENZOCAINE 2-2-14 % EX AERO
INHALATION_SPRAY | CUTANEOUS | Status: DC | PRN
  Administered 2017-03-19: 2 via TOPICAL

## 2017-03-19 NOTE — Progress Notes (Signed)
Speech Pathology:  Cancelled tx: Pt at procedure. Will f/u as schedule allows.  Zeva Leber L. Samson Frederic, [place] CCC/SLP Pager [phone]

## 2017-03-19 NOTE — Progress Notes (Signed)
OT Cancellation Note  Patient Details Name: [name] MRN: [id number] DOB: 1991/02/17   Cancelled Treatment:    Reason Eval/Treat Not Completed: Patient at procedure or test/ unavailable. Will follow up as time allows.  Gaye Alken M.S., OTR/L Pager: [phone]  03/19/2017, 9:50 AM

## 2017-03-19 NOTE — Progress Notes (Signed)
  Echocardiogram Echocardiogram Transesophageal has been performed.  Tye Savoy 03/19/2017, 10:50 AM

## 2017-03-19 NOTE — Progress Notes (Signed)
Patient went to CT scan in wheel chair with Transporter Rolm Bookbinder . While in CT department, patient needed to use the restroom, Dayton Scrape Burkes walked patient to the restroom, watched patient sit on the toliet, closed door , but left door open a crack for observation. Patient stood up to wash her hands, and Jaleesa Burkes and Raheem also a transporter both heard the walker move, but no sound of a fall. Jaleesa opened bathroom door then  Patient stated to transporter that she "fell" in the bathroom, but it wasn't witnessed by either transporter. Patient sat back in wheel chair with no assistance, and proceeded to get her CT scan. Hillis Range, Micah, CT tech, and Rendon, [place] tech all witnessed patient walk and get on CT table with no assistance. Patient got off CT table with no assistance and got back in wheel chair. Dayton Scrape Burkes told Tresa Endo, CT tech about the patient statement  And she said to make sure and report event to the RN . Upon arrival to floor, Jaleesa rolled patinet into room and patient stood and walked to the bed unassisted. Dayton Scrape informed Rhae Hammock, RN about patient statement of falling in the bathroom. RN called and spoke to Dr. Susie Cassette, who came to the bedside and had a conversation with the patient, and Hillis Range, and Verlon Setting, Water quality scientist.  Patient has had no complaints of pain. Will continue to monitor.

## 2017-03-19 NOTE — Interval H&P Note (Signed)
History and Physical Interval Note:  03/19/2017 9:57 AM  [name]  has presented today for surgery, with the diagnosis of atrial thrombus  The various methods of treatment have been discussed with the patient and family. After consideration of risks, benefits and other options for treatment, the patient has consented to  Procedure(s): TRANSESOPHAGEAL ECHOCARDIOGRAM (TEE) (N/A) as a surgical intervention .  The patient's history has been reviewed, patient examined, no change in status, stable for surgery.  I have reviewed the patient's chart and labs.  Questions were answered to the patient's satisfaction.     [name] S

## 2017-03-19 NOTE — Progress Notes (Addendum)
Physical Therapy Treatment Patient Details Name: [name] Husband MRN: [id number] DOB: [birth year] Today's Date: 03/19/2017    History of Present Illness 26 y.o.female who presents with right-sided weakness and chest pain and shortness of breath. CT, MRI, and MRA negative. PMH: asthma, depression, PTSD, clotting disorder, TIA, CAD, thrombosis of right atrium, cardiac surgery.     PT Comments    Pt making progress with mobility, able to ambulate 75 using rw. Gait pattern is variable, using step-through pattern initially and declining to dragging her Rt LE. Pt reports that she is unable to bear weight through her Rt LE during gait. During transfers the pt was noted to be weight bearing bilaterally and increased weight shift to Rt with turning to look behind her (not holding rw). Pt is progressing and anticipate D/C to home following her acute stay. Patient denies any questions or concerns. Pt does reports that she has been up walking around her room independently. Reminded to call for nursing assist.     Follow Up Recommendations  No PT follow up     Equipment Recommendations  None recommended by PT    Recommendations for Other Services       Precautions / Restrictions Precautions Precautions: Fall Restrictions Weight Bearing Restrictions: No    Mobility  Bed Mobility Overal bed mobility: Independent             General bed mobility comments: using UEs to lift Rt LE into bed.   Transfers Overall transfer level: Modified independent Equipment used: Rolling walker (2 wheeled) Transfers: Sit to/from Stand Sit to Stand: Modified independent (Device/Increase time)         General transfer comment: Good stability with standing, pt noted to bear weight through Rt LE  Ambulation/Gait Ambulation/Gait assistance: Supervision Ambulation Distance (Feet): 75 Feet Assistive device: Rolling walker (2 wheeled) Gait Pattern/deviations: Step-to pattern Gait velocity: decreased    General Gait Details: Pt initially using step to pattern with decreased weight placed through Rt LE but able to advance independently. As session progressed, pt dragging Rt LE.    Stairs            Wheelchair Mobility    Modified Rankin (Stroke Patients Only)       Balance Overall balance assessment: Needs assistance Sitting-balance support: No upper extremity supported Sitting balance-Leahy Scale: Normal     Standing balance support: During functional activity Standing balance-Leahy Scale: Fair Standing balance comment: pt able to stand and look over her shoulders and behind her with weightshifting onto each LE when looking at bed. During ambulation, pt using rw.                             Cognition Arousal/Alertness: Awake/alert Behavior During Therapy: WFL for tasks assessed/performed Overall Cognitive Status: Within Functional Limits for tasks assessed                                        Exercises      General Comments        Pertinent Vitals/Pain Pain Assessment: Faces Faces Pain Scale: Hurts a little bit Pain Location: Rt LE Pain Descriptors / Indicators: Guarding Pain Intervention(s): Monitored during session    Home Living                      Prior Function  PT Goals (current goals can now be found in the care plan section) Acute Rehab PT Goals Patient Stated Goal: stay and go home tomorrow.  PT Goal Formulation: With patient Time For Goal Achievement: 04/01/17 Potential to Achieve Goals: Good Progress towards PT goals: Progressing toward goals    Frequency    Min 3X/week      PT Plan Discharge plan needs to be updated    Co-evaluation             End of Session Equipment Utilized During Treatment:  (pt declined need for gait belt) Activity Tolerance: Patient tolerated treatment well Patient left: in bed;with call bell/phone within reach;with family/visitor present Nurse  Communication: Mobility status PT Visit Diagnosis: Unsteadiness on feet (R26.81);Muscle weakness (generalized) (M62.81);Difficulty in walking, not elsewhere classified (R26.2)     Time: 9147-8295 PT Time Calculation (min) (ACUTE ONLY): 10 min  Charges:  $Gait Training: 8-22 mins                    G Codes:       Christiane Ha, PT, CSCS Pager [phone] Office [phone] 03/19/2017, 2:24 PM

## 2017-03-19 NOTE — Anesthesia Postprocedure Evaluation (Signed)
Anesthesia Post Note  Patient: [name]  Procedure(s) Performed: Procedure(s) (LRB): TRANSESOPHAGEAL ECHOCARDIOGRAM (TEE) (N/A)  Patient location during evaluation: PACU Anesthesia Type: MAC Level of consciousness: awake and alert Pain management: pain level controlled Vital Signs Assessment: post-procedure vital signs reviewed and stable Respiratory status: spontaneous breathing, nonlabored ventilation and respiratory function stable Cardiovascular status: stable and blood pressure returned to baseline Anesthetic complications: no       Last Vitals:  Vitals:   03/19/17 1035 03/19/17 1045  BP: (!) 120/53 (!) 109/40  Pulse: (!) 102 93  Resp: (!) 25 (!) 22  Temp:      Last Pain:  Vitals:   03/19/17 0915  TempSrc: Oral  PainSc:                  Lynda Rainwater

## 2017-03-19 NOTE — H&P (View-Only) (Signed)
Ref: No PCP Per Patient   Subjective:  Feeling better.  Objective:  Vital Signs in the last 24 hours: Temp:  [98 F (36.7 C)-98.6 F (37 C)] 98.3 F (36.8 C) (04/10 0813) Pulse Rate:  [88-103] 89 (04/10 0813) Cardiac Rhythm: Sinus tachycardia (04/09 2030) Resp:  [14-24] 17 (04/10 0725) BP: (95-124)/(35-62) 95/35 (04/10 0813) SpO2:  [96 %-99 %] 99 % (04/10 0813) Weight:  [103.4 kg (228 lb)-105.6 kg (232 lb 12.9 oz)] 105.6 kg (232 lb 12.9 oz) (04/10 0725)  Physical Exam: BP Readings from Last 1 Encounters:  03/18/17 (!) 95/35    Wt Readings from Last 1 Encounters:  03/18/17 105.6 kg (232 lb 12.9 oz)    Weight change: 0 kg (0 lb) Body mass index is 41.24 kg/m. HEENT: [place]/AT, Eyes-Brown, PERL, EOMI, Conjunctiva-Pink, Sclera-Non-icteric Neck: No JVD, No bruit, Trachea midline. Lungs:  Clear, Bilateral. Midline surgical scar. Cardiac:  Regular rhythm, normal S1 and S2, no S3. II/VI systolic murmur. Abdomen:  Soft, non-tender. BS present. Extremities:  Trace edema present. No cyanosis. No clubbing. CNS: AxOx3, Cranial nerves grossly intact, moves all 4 extremities.  Skin: Warm and dry.   Intake/Output from previous day: 04/09 0701 - 04/10 0700 In: 950.6 [P.O.:240; I.V.:710.6] Out: 500 [Urine:500]    Lab Results: BMET    Component Value Date/Time   NA 138 03/18/2017 0406   NA 135 03/16/2017 2154   NA 133 (L) 03/16/2017 2138   K 4.4 03/18/2017 0406   K 3.6 03/16/2017 2154   K 3.7 03/16/2017 2138   CL 106 03/18/2017 0406   CL 105 03/16/2017 2154   CL 104 03/16/2017 2138   CO2 23 03/18/2017 0406   CO2 18 (L) 03/16/2017 2138   CO2 22 02/04/2017 1730   GLUCOSE 94 03/18/2017 0406   GLUCOSE 100 (H) 03/16/2017 2154   GLUCOSE 96 03/16/2017 2138   BUN 5 (L) 03/18/2017 0406   BUN 13 03/16/2017 2154   BUN 11 03/16/2017 2138   CREATININE 0.69 03/18/2017 0406   CREATININE 0.70 03/16/2017 2154   CREATININE 0.73 03/16/2017 2138   CALCIUM 8.8 (L) 03/18/2017 0406   CALCIUM  8.8 (L) 03/16/2017 2138   CALCIUM 9.1 02/04/2017 1730   GFRNONAA >60 03/18/2017 0406   GFRNONAA >60 03/16/2017 2138   GFRNONAA >60 02/04/2017 1730   GFRAA >60 03/18/2017 0406   GFRAA >60 03/16/2017 2138   GFRAA >60 02/04/2017 1730   CBC    Component Value Date/Time   WBC 6.9 03/18/2017 0406   RBC 4.75 03/18/2017 0406   HGB 13.3 03/18/2017 0406   HCT 41.2 03/18/2017 0406   PLT 318 03/18/2017 0406   MCV 86.7 03/18/2017 0406   MCH 28.0 03/18/2017 0406   MCHC 32.3 03/18/2017 0406   RDW 13.6 03/18/2017 0406   LYMPHSABS 3.6 03/16/2017 2138   MONOABS 0.7 03/16/2017 2138   EOSABS 0.1 03/16/2017 2138   BASOSABS 0.0 03/16/2017 2138   HEPATIC Function Panel  Recent Labs  12/28/16 0240 03/16/17 2138 03/18/17 0406  PROT 7.3 7.8 6.4*   HEMOGLOBIN A1C No components found for: HGA1C,  MPG CARDIAC ENZYMES Lab Results  Component Value Date   TROPONINI <0.03 03/17/2017   TROPONINI <0.03 03/17/2017   TROPONINI <0.03 03/17/2017   BNP No results for input(s): PROBNP in the last 8760 hours. TSH No results for input(s): TSH in the last 8760 hours. CHOLESTEROL  Recent Labs  07/08/16 0338 03/18/17 0406  CHOL 183 176    Scheduled Meds: .  stroke:  mapping our early stages of recovery book   Does not apply Once  . apixaban  5 mg Oral BID  . pantoprazole sodium  40 mg Oral Daily  . sucralfate  1 g Oral TID WC & HS  . Vilazodone HCl  40 mg Oral Daily   Continuous Infusions: . sodium chloride 75 mL/hr at 03/17/17 1916   PRN Meds:.acetaminophen, hydrOXYzine, nitroGLYCERIN, ondansetron, oxyCODONE, promethazine, senna-docusate, zolpidem  Assessment/Plan: Chest pain, musculoskeletal H/O right atrial thrombus PTSD Tobacco use disorder H/O stroke with right leg weakness  TEE postponed for tomorrow due to scheduling problem with endoscopy.   LOS: 1 day    Orpah Cobb  MD  03/18/2017, 10:19 AM

## 2017-03-19 NOTE — Anesthesia Procedure Notes (Signed)
Procedure Name: MAC Date/Time: 03/19/2017 10:01 AM Performed by: Candis Shine Pre-anesthesia Checklist: Patient identified, Emergency Drugs available, Suction available, Patient being monitored and Timeout performed Patient Re-evaluated:Patient Re-evaluated prior to inductionOxygen Delivery Method: Nasal cannula Dental Injury: Teeth and Oropharynx as per pre-operative assessment

## 2017-03-19 NOTE — Anesthesia Preprocedure Evaluation (Signed)
Anesthesia Evaluation  Patient identified by MRN, date of birth, ID band Patient awake    Reviewed: Allergy & Precautions, NPO status , Patient's Chart, lab work & pertinent test results  Airway Mallampati: II  TM Distance: >3 FB Neck ROM: Full    Dental no notable dental hx.    Pulmonary neg pulmonary ROS, asthma , former smoker,    Pulmonary exam normal breath sounds clear to auscultation       Cardiovascular + CAD  negative cardio ROS Normal cardiovascular exam Rhythm:Regular Rate:Normal     Neuro/Psych Depression TIACVA, Residual Symptoms negative neurological ROS  negative psych ROS   GI/Hepatic negative GI ROS, Neg liver ROS,   Endo/Other  negative endocrine ROSMorbid obesity  Renal/GU negative Renal ROS  negative genitourinary   Musculoskeletal negative musculoskeletal ROS (+)   Abdominal (+) + obese,   Peds negative pediatric ROS (+)  Hematology Clotting disorder   Anesthesia Other Findings   Reproductive/Obstetrics negative OB ROS                             Anesthesia Physical Anesthesia Plan  ASA: III  Anesthesia Plan: MAC   Post-op Pain Management:    Induction: Intravenous  Airway Management Planned:   Additional Equipment:   Intra-op Plan:   Post-operative Plan:   Informed Consent: I have reviewed the patients History and Physical, chart, labs and discussed the procedure including the risks, benefits and alternatives for the proposed anesthesia with the patient or authorized representative who has indicated his/her understanding and acceptance.   Dental advisory given  Plan Discussed with: CRNA  Anesthesia Plan Comments:         Anesthesia Quick Evaluation

## 2017-03-19 NOTE — CV Procedure (Signed)
INDICATIONS:   The patient is 26 year old female with known history of right atrial thrombus was off anticoagulant for a while.  PROCEDURE:  Informed consent was discussed including risks, benefits and alternatives for the procedure.  Risks include, but are not limited to, cough, sore throat, vomiting, nausea, somnolence, esophageal and stomach trauma or perforation, bleeding, low blood pressure, aspiration, pneumonia, infection, trauma to the teeth and death.    Patient was given sedation.  The oropharynx was anesthetized with topical lidocaine.  The transesophageal probe was inserted in the esophagus and stomach and multiple views were obtained.  Agitated saline was used after the transesophageal probe was removed from the body.  The patient was kept under observation until the patient left the procedure room.  The patient left the procedure room in stable condition.   COMPLICATIONS:  There were no immediate complications.  FINDINGS:  1. LEFT VENTRICLE: The left ventricle is normal in structure and function.  Wall motion is normal.  No thrombus or masses seen in the left ventricle.  2. RIGHT VENTRICLE:  The right ventricle is normal in structure and function without any thrombus or masses.    3. LEFT ATRIUM:  The left atrium is normal without any thrombus or masses.  4. LEFT ATRIAL APPENDAGE:  The left atrial appendage is free of any thrombus or masses.  5. RIGHT ATRIUM:  The right atrium has strand time highly mobile thrombus over free wall near right atrial appendage and layered thrombus, probably old with some haziness over it on mid free wall of right atrium suggestive of thrombus.    6. ATRIAL SEPTUM:  The atrial septum is normal without any ASD or PFO. Negative sonicated saline injection.  7. MITRAL VALVE:  The mitral valve is normal in structure and function with trivial regurgitation, no masses, stenosis or vegetations.  8. TRICUSPID VALVE:  The tricuspid valve is normal in  structure and function with mild regurgitation, no masses, stenosis or vegetations.  9. AORTIC VALVE:  The aortic valve is normal in structure and function without regurgitation, masses, stenosis or vegetations.  10. PULMONIC VALVE:  The pulmonic valve is normal in structure and function without regurgitation, masses, stenosis or vegetations.  11. AORTIC ARCH, ASCENDING AND DESCENDING AORTA:  The aorta had no atherosclerosis in the ascending and minimal atherosclerosis in descending aorta.  The aortic arch was normal.  12.  Superior Vena Cava : No thrombus or catheter.  13.  Pulmonary Veins: Visible.  14.  Pulmonary artery: visible and normal.   IMPRESSION:   1. Normal LV systolic function. 2. Right atrial thrombi. 3. No PFO.  RECOMMENDATIONS:    Continue Eliquis 5 mg. bid.

## 2017-03-19 NOTE — Progress Notes (Signed)
STROKE TEAM PROGRESS NOTE   SUBJECTIVE (INTERVAL HISTORY) One female visitor sleeping in the couch during round. Pt was sleeping too but woke up during rounds. She has no complains. She had TEE today and still showed right atrial thrombus. Recommend to continue eliquis  bid. She is working on the Public relations account executive.    OBJECTIVE Temp:  [97.4 F (36.3 C)-98.8 F (37.1 C)] 98.8 F (37.1 C) (04/11 1241) Pulse Rate:  [85-104] 93 (04/11 1045) Cardiac Rhythm: Sinus tachycardia (04/11 0800) Resp:  [11-25] 22 (04/11 1045) BP: (99-134)/(37-82) 121/59 (04/11 1241) SpO2:  [96 %-100 %] 99 % (04/11 1045) Weight:  [231 lb 1.6 oz (104.8 kg)] 231 lb 1.6 oz (104.8 kg) (04/11 0255)  CBC:   Recent Labs Lab 03/16/17 2138  03/18/17 0406 03/19/17 0325  WBC 9.4  --  6.9 5.8  NEUTROABS 5.0  --   --   --   HGB 14.8  < > 13.3 13.2  HCT 43.7  < > 41.2 41.1  MCV 85.5  --  86.7 87.1  PLT 402*  --  318 330  < > = values in this interval not displayed.  Basic Metabolic Panel:   Recent Labs Lab 03/16/17 2138 03/16/17 2154 03/18/17 0406  NA 133* 135 138  K 3.7 3.6 4.4  CL 104 105 106  CO2 18*  --  23  GLUCOSE 96 100* 94  BUN 11 13 5*  CREATININE 0.73 0.70 0.69  CALCIUM 8.8*  --  8.8*    Lipid Panel:     Component Value Date/Time   CHOL 176 03/18/2017 0406   TRIG 239 (H) 03/18/2017 0406   HDL 36 (L) 03/18/2017 0406   CHOLHDL 4.9 03/18/2017 0406   VLDL 48 (H) 03/18/2017 0406   LDLCALC 92 03/18/2017 0406   HgbA1c:  Lab Results  Component Value Date   HGBA1C 5.3 03/18/2017   Urine Drug Screen:     Component Value Date/Time   LABOPIA NONE DETECTED 03/16/2017 2235   COCAINSCRNUR NONE DETECTED 03/16/2017 2235   LABBENZ NONE DETECTED 03/16/2017 2235   AMPHETMU NONE DETECTED 03/16/2017 2235   THCU NONE DETECTED 03/16/2017 2235   LABBARB NONE DETECTED 03/16/2017 2235    Alcohol Level     Component Value Date/Time   ETH <5 12/28/2016 0240    IMAGING I have personally  reviewed the radiological images below and agree with the radiology interpretations.  Ct Head Wo Contrast 03/16/2017 No acute intracranial findings. Minimal chronic inflammatory change right maxillary sinus.   Ct Angio Chest Pe W And/or Wo Contrast 03/17/2017 1. No acute cardiopulmonary disease. 2. No large central pulmonary embolus. 3. No aortic dissection or aneurysm.   Mr Brain Wo Contrast 03/17/2017 1. No acute intracranial abnormality identified. 2. Stable nonspecific foci of T2 FLAIR hyperintensity within subcortical white matter of the frontal lobes, possibly related to migraine headaches or vasculopathy. Atypical distribution for demyelination.   Mr Maxine Glenn Head Wo Contrast 03/17/2017 3. Normal MRA of the head without contrast.   Mr Mrv Head W Wo Cm 03/17/2017 4. Normal MRV of the head with and without contrast.   Carotid Doppler   Study was technically difficult due to patient body habitus, depth of vessels, and high bifurcation. There is no obvious evidence of hemodynamically significant internal carotid artery stenosis bilaterally. Vertebral arteries are patent with antegrade flow.  LE doppler - No evidence of deep vein thrombosis involving the right lower  extremity and left lower extremity. - No evidence of  Baker&'s cyst on the right or left.  2D Echocardiogram  - Left ventricle: The cavity size was normal. Systolic function was vigorous. The estimated ejection fraction was in the range of 65% to 70%. Wall motion was normal; there were no regional wall motion abnormalities. Doppler parameters are consistent with abnormal left ventricular relaxation (grade 1 diastolic dysfunction). - Left atrium: Cannot exclude thrombus. - Right atrium: Cannot exclude thrombus.  TEE 1. Normal LV systolic function. 2. Right atrial thrombi. 3. No PFO.   PHYSICAL EXAM  Temp:  [97.4 F (36.3 C)-98.8 F (37.1 C)] 98.8 F (37.1 C) (04/11 1241) Pulse Rate:  [85-104] 93 (04/11 1045) Resp:   [11-25] 22 (04/11 1045) BP: (99-134)/(37-82) 121/59 (04/11 1241) SpO2:  [96 %-100 %] 99 % (04/11 1045) Weight:  [231 lb 1.6 oz (104.8 kg)] 231 lb 1.6 oz (104.8 kg) (04/11 0255)  General - morbid obesity, well developed, not in acute distress.  Ophthalmologic - Fundi not visualized due to noncooperative.  Cardiovascular - Regular rate and rhythm.  Mental Status -  Level of arousal and orientation to time, place, and person were intact. Language including expression, naming, repetition, comprehension was assessed and found intact.  Cranial Nerves II - XII - II - Visual field intact OU. III, IV, VI - Extraocular movements intact. V - Facial sensation intact bilaterally. VII - Facial movement intact bilaterally. VIII - Hearing & vestibular intact bilaterally. X - Palate elevates symmetrically. XI - Chin turning & shoulder shrug intact bilaterally. XII - Tongue protrusion intact.  Motor Strength - The patient's strength showed giveaway weakness on the right UE and right LE. Right UE demonstrated drift but no pronation and drift can be prevent with distraction. RLE 0/5 but hoover sign positive.  Bulk was normal and fasciculations were absent.   Motor Tone - Muscle tone was assessed at the neck and appendages and was normal.  Reflexes - The patient's reflexes were 1+ in all extremities and she had no pathological reflexes.  Sensory - Light touch, temperature/pinprick subjectively decreased on the right.    Coordination - The patient had normal movements in the hands with no ataxia or dysmetria. However, slow on right arm movement.  Gait and Station - not tested. But by report, she was able to walk with PT in home and hallway but dragging at RLE   ASSESSMENT/PLAN [name] is a 26 y.o. female with history of right atrial thrombosis s/p surgical intervention, clotting disorder, TIA, CAD, asthma, PTSD, depression, morbid obesity and tobacco abuse presenting with R arm and leg  weakness and sensory numbness. She did not receive IV t-PA due to being outside the window.   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 R sinus dz  CTA chest no PE, aortic dissection/aneurysm, CP dz  MRI  No acute stroke.  MRA  normal  MRV normal  Carotid Doppler unremarkable  2D Echo  EF 65-70%. No source of embolus   LE doppler negative   TEE continue to show right atrial thrombi  LDL 92   HgbA1c 5.3  IV heparin for VTE prophylaxis Diet Heart Room service appropriate? Yes; Fluid consistency: Thin Diet - low sodium heart healthy  Eliquis (apixaban) daily prior to admission, on eliquis 5 mg bid. Recommend to continue eliquis  bid and work with financial assistance program to ensure the supply.  Patient counseled to be compliant with her antithrombotic medications  Ongoing aggressive stroke risk  factor management  Therapy recommendations:  Home PT  Disposition:  pending   Clotting disorder  2012 left LE DVT  2014 R atrial thrombus s/p thrombectomy at [facility] and not compliant with warfarin anticoagulation afterwards.   06/2016 small right atrium thrombus on TEE -> eliquis -> stop by self due to financial difficulty  Now on heparin IV and now transitioned to eliquis.  limited hypercoag labs pending  TEE right atrial thrombi this admission  pt can not afford eliquis and she is applying for financial assistance. She also can not follow up with INR monitoring if she is to put on coumadin.  Recommend to continue eliquis  bid and work with financial assistance program to ensure the supply.  Hyperlipidemia  Home meds:  Lipitor 10, resumed in hospital  LDL 92, goal < 70  Resumed lipitor 10  Continue statin at discharge  Tobacco abuse  Current smoker  Smoking cessation counseling provided  Pt is willing to quit  Other Stroke Risk Factors  Morbid Obesity, Body mass index is 40.94  kg/m., recommend weight loss, diet and exercise as appropriate   Other Active Problems  Chest pain, musculoskeletal  PTSD  Depression  Dysphagia   Hospital day # 2  Neurology will sign off. Please call with questions. Pt will follow up with Dr. Roda Shutters at [facility] in about 6 weeks. Thanks for the consult.   Marvel Plan, MD PhD Stroke Neurology 03/19/2017 4:37 PM   To contact Stroke Continuity provider, please refer to [email or web address]. After hours, contact General Neurology

## 2017-03-19 NOTE — Discharge Summary (Addendum)
Physician Discharge Summary  [name] MRN: [id number] DOB/AGE: 03-16-1991 26 y.o.  PCP: No PCP Per Patient   Admit date: 03/16/2017 Discharge date: 03/19/2017  Discharge Diagnoses:    Principal Problem:   Right sided weakness Active Problems:   Chest pain   Depression   Calf pain   UTI (urinary tract infection)   Stroke (cerebrum) (HCC)   Dysphagia   Paresthesia   Conversion disorder   Noncompliance   Clotting disorder ([place])       Follow-up recommendations Follow-up with PCP in 3-5 days , including all  additional recommended appointments as below Follow-up CBC, CMP in 3-5 days Please do not prescribe narcotic medications ,if work up is negative in the ER /inpatient , please refer to the following progress note from pharmacy ,to review all narcotic prescriptions received by patient in last 6 months Leodis Sias, [facility] Pharmacist Signed Pharmacy  Progress Notes Date of Service: 03/18/2017 1:50 PM        Current Discharge Medication List    START taking these medications   Details  pantoprazole sodium (PROTONIX) 40 mg/20 mL PACK Take 20 mLs (40 mg total) by mouth daily. Qty: 30 each, Refills: 1    sucralfate (CARAFATE) 1 GM/10ML suspension Take 10 mLs (1 g total) by mouth 4 (four) times daily -  with meals and at bedtime. Qty: 420 mL, Refills: 0      CONTINUE these medications which have CHANGED   Details  !! apixaban (ELIQUIS) 5 MG TABS tablet Take 1 tablet (5 mg total) by mouth 2 (two) times daily. Qty: 60 tablet, Refills: 1    !! apixaban (ELIQUIS) 5 MG TABS tablet Take 2 tablets (10 mg total) by mouth 2 (two) times daily. Qty: 14 tablet, Refills: 0     !! - Potential duplicate medications found. Please discuss with provider.    CONTINUE these medications which have NOT CHANGED   Details  acetaminophen (TYLENOL) 325 MG tablet Take 650 mg by mouth every 6 (six) hours as needed for mild pain.    hydrOXYzine (ATARAX/VISTARIL) 25 MG tablet Take 25 mg by  mouth 2 (two) times daily as needed for anxiety.     ondansetron (ZOFRAN) 8 MG tablet Take 8 mg by mouth every 8 (eight) hours as needed for nausea or vomiting.    promethazine (PHENERGAN) 25 MG tablet Take 25 mg by mouth every 6 (six) hours as needed for nausea or vomiting.    Vilazodone HCl 20 MG TABS Take 40 mg by mouth daily.     atorvastatin (LIPITOR) 10 MG tablet Take 1 tablet (10 mg total) by mouth daily at 6 PM. Qty: 30 tablet, Refills: 0    magic mouthwash w/lidocaine SOLN Take 10 mLs by mouth 4 (four) times daily as needed (throat pain). Note to pharmacy - equal parts diphendydramine, aluminum hydroxide and lidocaine HCL Qty: 120 mL, Refills: 0      STOP taking these medications     oxyCODONE-acetaminophen (PERCOCET/ROXICET) 5-325 MG tablet      clindamycin (CLEOCIN) 300 MG capsule           Discharge Condition: Stable   Discharge Instructions Get Medicines reviewed and adjusted: Please take all your medications with you for your next visit with your Primary MD  Please request your Primary MD to go over all hospital tests and procedure/radiological results at the follow up, please ask your Primary MD to get all Hospital records sent to his/her office.  If you experience worsening  of your admission symptoms, develop shortness of breath, life threatening emergency, suicidal or homicidal thoughts you must seek medical attention immediately by calling 911 or calling your MD immediately if symptoms less severe.  You must read complete instructions/literature along with all the possible adverse reactions/side effects for all the Medicines you take and that have been prescribed to you. Take any new Medicines after you have completely understood and accpet all the possible adverse reactions/side effects.   Do not drive when taking Pain medications.   Do not take more than prescribed Pain, Sleep and Anxiety Medications  Special Instructions: If you have smoked or chewed  Tobacco in the last 2 yrs please stop smoking, stop any regular Alcohol and or any Recreational drug use.  Wear Seat belts while driving.  Please note  You were cared for by a hospitalist during your hospital stay. Once you are discharged, your primary care physician will handle any further medical issues. Please note that NO REFILLS for any discharge medications will be authorized once you are discharged, as it is imperative that you return to your primary care physician (or establish a relationship with a primary care physician if you do not have one) for your aftercare needs so that they can reassess your need for medications and monitor your lab values.     Allergies  Allergen Reactions  . Fluoxetine Other (See Comments)    Pt told not to take with eliquis  . Ceftriaxone Itching and Other (See Comments)    Red man syndrome  . Gabapentin Other (See Comments)    Confusion/ chest pains  . Morphine And Related Itching and Nausea And Vomiting  . Nsaids Other (See Comments)    Pt told by hematologist not to take  . Topamax [Topiramate] Other (See Comments)    Dizziness, black out spells  . Toradol [Ketorolac Tromethamine] Itching and Other (See Comments)    Felt hot  . Tramadol     Flushed   . Vicodin [Hydrocodone-Acetaminophen] Other (See Comments)    hallucinations  . Depakote [Divalproex Sodium] Rash and Other (See Comments)    Skin felt like it was on fire  . Methocarbamol Rash  . Metoclopramide Rash and Hives      Disposition: 01-Home or Self Care   Consults:  Cardiology     Significant Diagnostic Studies:  Ct Head Wo Contrast  Result Date: 03/16/2017 CLINICAL DATA:  Right leg and arm weakness and numbness. EXAM: CT HEAD WITHOUT CONTRAST TECHNIQUE: Contiguous axial images were obtained from the base of the skull through the vertex without intravenous contrast. COMPARISON:  12/28/2016 and 07/07/2016 FINDINGS: Brain: Ventricles, cisterns and other CSF spaces are  within normal. There is no mass, mass effect, shift of midline structures or acute hemorrhage. No evidence of acute infarction. Vascular: Normal. Skull: Normal. Sinuses/Orbits: The orbits are normal. Subtle focal opacification over the lateral wall the right maxillary sinus. Other: None. IMPRESSION: No acute intracranial findings. Minimal chronic inflammatory change right maxillary sinus. Electronically Signed   By: Marin Olp M.D.   On: 03/16/2017 22:17   Ct Angio Chest Pe W And/or Wo Contrast  Result Date: 03/17/2017 CLINICAL DATA:  Right leg paresthesia and weakness EXAM: CT ANGIOGRAPHY CHEST WITH CONTRAST TECHNIQUE: Multidetector CT imaging of the chest was performed using the standard protocol during bolus administration of intravenous contrast. Multiplanar CT image reconstructions and MIPs were obtained to evaluate the vascular anatomy. CONTRAST:  100 cc Isovue 370 IV COMPARISON:  12/23/2016 CT FINDINGS: Cardiovascular: Satisfactory  opacification of the pulmonary arteries to the proximal segmental level given patient body habitus. No evidence of pulmonary embolism. Normal heart size. No pericardial effusion. Mediastinum/Nodes: No enlarged mediastinal, hilar, or axillary lymph nodes. Thyroid gland, trachea, and esophagus demonstrate no significant findings. Lungs/Pleura: Lungs are clear. There is dependent atelectasis bilaterally. No pleural effusion or pneumothorax. Upper Abdomen: No acute abnormality. Musculoskeletal: No chest wall abnormality. No acute or significant osseous findings. Review of the MIP images confirms the above findings. IMPRESSION: 1. No acute cardiopulmonary disease. 2. No large central pulmonary embolus. 3. No aortic dissection or aneurysm. Electronically Signed   By: Ashley Royalty M.D.   On: 03/17/2017 01:34   Mr Jodene Nam Head Wo Contrast  Result Date: 03/17/2017 CLINICAL DATA:  26 y/o  F; right arm and leg weakness. EXAM: MRI HEAD WITHOUT CONTRAST MRA HEAD WITHOUT CONTRAST MRV HEAD  WITHOUT AND WITH CONTRAST TECHNIQUE: Multiplanar, multiecho pulse sequences of the brain and surrounding structures were obtained without intravenous contrast. Angiographic images of the head were obtained using MRA technique without contrast. Angiographic images were obtained of the head with MRV technique with and without intravenous contrast. COMPARISON:  03/16/2017 CT of the head. 12/28/2016 MRI and MRA of the head. CONTRAST:  20 cc MultiHance FINDINGS: MRI HEAD FINDINGS Brain: No acute infarction, hemorrhage, hydrocephalus, extra-axial collection or mass lesion. Stable few nonspecific foci of T2 FLAIR hyperintensity are present within subcortical white matter of the frontal lobes bilaterally. No white matter lesions identified within splenium of corpus callosum, periventricular white matter, brainstem, basal ganglia, or cerebellum. Vascular: As below. Skull and upper cervical spine: Normal marrow signal. Sinuses/Orbits: Small right maxillary sinus mucous retention cyst. Otherwise no abnormal signal of the visualized paranasal sinuses or mastoid air cells. Other: None. MRA HEAD FINDINGS Internal carotid arteries:  Patent. Anterior cerebral arteries:  Patent. Middle cerebral arteries: Patent. Anterior communicating artery: Patent. Posterior communicating arteries: Probable diminutive posterior communicating arteries bilaterally. Posterior cerebral arteries:  Patent. Basilar artery:  Patent. Vertebral arteries:  Patent. No evidence of high-grade stenosis, large vessel occlusion, or aneurysm unless noted above. MRV HEAD FINDINGS Normal flow related signal on time-of-flight MRV of the superior sagittal sinus, straight sinus, internal cerebral veins, basal veins of Rosenthal, bilateral transverse sinuses, bilateral sigmoid sinuses, bilateral upper internal jugular veins, and large cortical veins. After administration of intravenous contrast there is normal enhancement of the dural venous sinuses as above. IMPRESSION:  1. No acute intracranial abnormality identified. 2. Stable nonspecific foci of T2 FLAIR hyperintensity within subcortical white matter of the frontal lobes, possibly related to migraine headaches or vasculopathy. Atypical distribution for demyelination. 3. Normal MRA of the head without contrast. 4. Normal MRV of the head with and without contrast. Electronically Signed   By: Kristine Garbe M.D.   On: 03/17/2017 03:13   Mr Brain Wo Contrast  Result Date: 03/17/2017 CLINICAL DATA:  26 y/o  F; right arm and leg weakness. EXAM: MRI HEAD WITHOUT CONTRAST MRA HEAD WITHOUT CONTRAST MRV HEAD WITHOUT AND WITH CONTRAST TECHNIQUE: Multiplanar, multiecho pulse sequences of the brain and surrounding structures were obtained without intravenous contrast. Angiographic images of the head were obtained using MRA technique without contrast. Angiographic images were obtained of the head with MRV technique with and without intravenous contrast. COMPARISON:  03/16/2017 CT of the head. 12/28/2016 MRI and MRA of the head. CONTRAST:  20 cc MultiHance FINDINGS: MRI HEAD FINDINGS Brain: No acute infarction, hemorrhage, hydrocephalus, extra-axial collection or mass lesion. Stable few nonspecific foci of T2 FLAIR  hyperintensity are present within subcortical white matter of the frontal lobes bilaterally. No white matter lesions identified within splenium of corpus callosum, periventricular white matter, brainstem, basal ganglia, or cerebellum. Vascular: As below. Skull and upper cervical spine: Normal marrow signal. Sinuses/Orbits: Small right maxillary sinus mucous retention cyst. Otherwise no abnormal signal of the visualized paranasal sinuses or mastoid air cells. Other: None. MRA HEAD FINDINGS Internal carotid arteries:  Patent. Anterior cerebral arteries:  Patent. Middle cerebral arteries: Patent. Anterior communicating artery: Patent. Posterior communicating arteries: Probable diminutive posterior communicating arteries  bilaterally. Posterior cerebral arteries:  Patent. Basilar artery:  Patent. Vertebral arteries:  Patent. No evidence of high-grade stenosis, large vessel occlusion, or aneurysm unless noted above. MRV HEAD FINDINGS Normal flow related signal on time-of-flight MRV of the superior sagittal sinus, straight sinus, internal cerebral veins, basal veins of Rosenthal, bilateral transverse sinuses, bilateral sigmoid sinuses, bilateral upper internal jugular veins, and large cortical veins. After administration of intravenous contrast there is normal enhancement of the dural venous sinuses as above. IMPRESSION: 1. No acute intracranial abnormality identified. 2. Stable nonspecific foci of T2 FLAIR hyperintensity within subcortical white matter of the frontal lobes, possibly related to migraine headaches or vasculopathy. Atypical distribution for demyelination. 3. Normal MRA of the head without contrast. 4. Normal MRV of the head with and without contrast. Electronically Signed   By: Kristine Garbe M.D.   On: 03/17/2017 03:13   Dg Esophagus  Result Date: 03/17/2017 CLINICAL DATA:  Dysphagia EXAM: ESOPHOGRAM/BARIUM SWALLOW TECHNIQUE: Single contrast examination was performed using thin barium and barium tablet. FLUOROSCOPY TIME:  Fluoroscopy Time:  2 minutes 0 second Radiation Exposure Index (if provided by the fluoroscopic device): Number of Acquired Spot Images: 0 COMPARISON:  CT chest 03/16/2017 FINDINGS: Esophageal mucosa and motility normal. Negative for stricture or mass. No mucosal irregularity or edema. Negative for hiatal hernia. Barium tablet passed readily into the stomach without obstruction IMPRESSION: Negative Electronically Signed   By: Franchot Gallo M.D.   On: 03/17/2017 11:11   Mr Mrv Miguel Dibble GE Cm  Result Date: 03/17/2017 CLINICAL DATA:  26 y/o  F; right arm and leg weakness. EXAM: MRI HEAD WITHOUT CONTRAST MRA HEAD WITHOUT CONTRAST MRV HEAD WITHOUT AND WITH CONTRAST TECHNIQUE: Multiplanar,  multiecho pulse sequences of the brain and surrounding structures were obtained without intravenous contrast. Angiographic images of the head were obtained using MRA technique without contrast. Angiographic images were obtained of the head with MRV technique with and without intravenous contrast. COMPARISON:  03/16/2017 CT of the head. 12/28/2016 MRI and MRA of the head. CONTRAST:  20 cc MultiHance FINDINGS: MRI HEAD FINDINGS Brain: No acute infarction, hemorrhage, hydrocephalus, extra-axial collection or mass lesion. Stable few nonspecific foci of T2 FLAIR hyperintensity are present within subcortical white matter of the frontal lobes bilaterally. No white matter lesions identified within splenium of corpus callosum, periventricular white matter, brainstem, basal ganglia, or cerebellum. Vascular: As below. Skull and upper cervical spine: Normal marrow signal. Sinuses/Orbits: Small right maxillary sinus mucous retention cyst. Otherwise no abnormal signal of the visualized paranasal sinuses or mastoid air cells. Other: None. MRA HEAD FINDINGS Internal carotid arteries:  Patent. Anterior cerebral arteries:  Patent. Middle cerebral arteries: Patent. Anterior communicating artery: Patent. Posterior communicating arteries: Probable diminutive posterior communicating arteries bilaterally. Posterior cerebral arteries:  Patent. Basilar artery:  Patent. Vertebral arteries:  Patent. No evidence of high-grade stenosis, large vessel occlusion, or aneurysm unless noted above. MRV HEAD FINDINGS Normal flow related signal on time-of-flight MRV of the  superior sagittal sinus, straight sinus, internal cerebral veins, basal veins of Rosenthal, bilateral transverse sinuses, bilateral sigmoid sinuses, bilateral upper internal jugular veins, and large cortical veins. After administration of intravenous contrast there is normal enhancement of the dural venous sinuses as above. IMPRESSION: 1. No acute intracranial abnormality identified.  2. Stable nonspecific foci of T2 FLAIR hyperintensity within subcortical white matter of the frontal lobes, possibly related to migraine headaches or vasculopathy. Atypical distribution for demyelination. 3. Normal MRA of the head without contrast. 4. Normal MRV of the head with and without contrast. Electronically Signed   By: Mitzi Hansen M.D.   On: 03/17/2017 03:13    echocardiogram    LV EF: 65% -   70%  ------------------------------------------------------------------- Indications:      Chest pain 786.51.  ------------------------------------------------------------------- History:   PMH:  History of right atrial thrombus.  ------------------------------------------------------------------- Study Conclusions  - Left ventricle: The cavity size was normal. Systolic function was   vigorous. The estimated ejection fraction was in the range of 65%   to 70%. Wall motion was normal; there were no regional wall   motion abnormalities. Doppler parameters are consistent with   abnormal left ventricular relaxation (grade 1 diastolic   dysfunction). - Left atrium: Cannot exclude thrombus. - Right atrium: Cannot exclude thrombus.  Recommendations:  Consider transesophageal echocardiography if clinically indicated in order to exclude intracardiac thrombus.      Filed Weights   03/18/17 0428 03/18/17 0725 03/19/17 0255  Weight: 105.6 kg (232 lb 12.9 oz) 105.6 kg (232 lb 12.9 oz) 104.8 kg (231 lb 1.6 oz)     Microbiology: Recent Results (from the past 240 hour(s))  Urine culture     Status: Abnormal   Collection Time: 03/16/17 10:35 PM  Result Value Ref Range Status   Specimen Description URINE, RANDOM  Final   Special Requests NONE  Final   Culture MULTIPLE SPECIES PRESENT, SUGGEST RECOLLECTION (A)  Final   Report Status 03/18/2017 FINAL  Final  Culture, blood (Routine X 2) w Reflex to ID Panel     Status: None (Preliminary result)   Collection Time: 03/17/17   6:15 AM  Result Value Ref Range Status   Specimen Description BLOOD LEFT ARM  Final   Special Requests   Final    BOTTLES DRAWN AEROBIC AND ANAEROBIC Blood Culture adequate volume   Culture NO GROWTH 2 DAYS  Final   Report Status PENDING  Incomplete  Culture, blood (Routine X 2) w Reflex to ID Panel     Status: None (Preliminary result)   Collection Time: 03/17/17  6:22 AM  Result Value Ref Range Status   Specimen Description BLOOD LEFT HAND  Final   Special Requests IN PEDIATRIC BOTTLE Blood Culture adequate volume  Final   Culture NO GROWTH 2 DAYS  Final   Report Status PENDING  Incomplete  MRSA PCR Screening     Status: None   Collection Time: 03/18/17  6:20 AM  Result Value Ref Range Status   MRSA by PCR NEGATIVE NEGATIVE Final    Comment:        The GeneXpert MRSA Assay (FDA approved for NASAL specimens only), is one component of a comprehensive MRSA colonization surveillance program. It is not intended to diagnose MRSA infection nor to guide or monitor treatment for MRSA infections.        Blood Culture    Component Value Date/Time   SDES BLOOD LEFT HAND 03/17/2017 0622   SPECREQUEST IN PEDIATRIC BOTTLE Blood Culture  adequate volume 03/17/2017 0622   CULT NO GROWTH 2 DAYS 03/17/2017 0622   REPTSTATUS PENDING 03/17/2017 0622      Labs: Results for orders placed or performed during the hospital encounter of 03/16/17 (from the past 48 hour(s))  Heparin level (unfractionated)     Status: None   Collection Time: 03/17/17 12:37 PM  Result Value Ref Range   Heparin Unfractionated 0.47 0.30 - 0.70 IU/mL    Comment:        IF HEPARIN RESULTS ARE BELOW EXPECTED VALUES, AND PATIENT DOSAGE HAS BEEN CONFIRMED, SUGGEST FOLLOW UP TESTING OF ANTITHROMBIN III LEVELS.   Troponin I (q 6hr x 3)     Status: None   Collection Time: 03/17/17 12:37 PM  Result Value Ref Range   Troponin I <0.03 <0.03 ng/mL  Troponin I (q 6hr x 3)     Status: None   Collection Time: 03/17/17   7:05 PM  Result Value Ref Range   Troponin I <0.03 <0.03 ng/mL  Heparin level (unfractionated)     Status: Abnormal   Collection Time: 03/17/17  7:05 PM  Result Value Ref Range   Heparin Unfractionated 0.27 (L) 0.30 - 0.70 IU/mL    Comment:        IF HEPARIN RESULTS ARE BELOW EXPECTED VALUES, AND PATIENT DOSAGE HAS BEEN CONFIRMED, SUGGEST FOLLOW UP TESTING OF ANTITHROMBIN III LEVELS.   CBC     Status: None   Collection Time: 03/18/17  4:06 AM  Result Value Ref Range   WBC 6.9 4.0 - 10.5 K/uL   RBC 4.75 3.87 - 5.11 MIL/uL   Hemoglobin 13.3 12.0 - 15.0 g/dL   HCT 41.2 36.0 - 46.0 %   MCV 86.7 78.0 - 100.0 fL   MCH 28.0 26.0 - 34.0 pg   MCHC 32.3 30.0 - 36.0 g/dL   RDW 13.6 11.5 - 15.5 %   Platelets 318 150 - 400 K/uL  Hemoglobin A1c     Status: None   Collection Time: 03/18/17  4:06 AM  Result Value Ref Range   Hgb A1c MFr Bld 5.3 4.8 - 5.6 %    Comment: (NOTE)         Pre-diabetes: 5.7 - 6.4         Diabetes: >6.4         Glycemic control for adults with diabetes: <7.0    Mean Plasma Glucose 105 mg/dL    Comment: (NOTE) Performed At: [facility] [place], [place] [id number] Lindon Romp MD WU:[id number]   Comprehensive metabolic panel     Status: Abnormal   Collection Time: 03/18/17  4:06 AM  Result Value Ref Range   Sodium 138 135 - 145 mmol/L   Potassium 4.4 3.5 - 5.1 mmol/L    Comment: NO VISIBLE HEMOLYSIS   Chloride 106 101 - 111 mmol/L   CO2 23 22 - 32 mmol/L   Glucose, Bld 94 65 - 99 mg/dL   BUN 5 (L) 6 - 20 mg/dL   Creatinine, Ser 0.69 0.44 - 1.00 mg/dL   Calcium 8.8 (L) 8.9 - 10.3 mg/dL   Total Protein 6.4 (L) 6.5 - 8.1 g/dL   Albumin 3.3 (L) 3.5 - 5.0 g/dL   AST 16 15 - 41 U/L   ALT 8 (L) 14 - 54 U/L   Alkaline Phosphatase 50 38 - 126 U/L   Total Bilirubin 0.2 (L) 0.3 - 1.2 mg/dL   GFR calc non Af Amer >60 >60 mL/min  GFR calc Af Amer >60 >60 mL/min    Comment: (NOTE) The eGFR has been calculated using the CKD EPI  equation. This calculation has not been validated in all clinical situations. eGFR's persistently <60 mL/min signify possible Chronic Kidney Disease.    Anion gap 9 5 - 15  Heparin level (unfractionated)     Status: None   Collection Time: 03/18/17  4:06 AM  Result Value Ref Range   Heparin Unfractionated 0.41 0.30 - 0.70 IU/mL    Comment:        IF HEPARIN RESULTS ARE BELOW EXPECTED VALUES, AND PATIENT DOSAGE HAS BEEN CONFIRMED, SUGGEST FOLLOW UP TESTING OF ANTITHROMBIN III LEVELS.   Lipid panel     Status: Abnormal   Collection Time: 03/18/17  4:06 AM  Result Value Ref Range   Cholesterol 176 0 - 200 mg/dL   Triglycerides 239 (H) <150 mg/dL   HDL 36 (L) >40 mg/dL   Total CHOL/HDL Ratio 4.9 RATIO   VLDL 48 (H) 0 - 40 mg/dL   LDL Cholesterol 92 0 - 99 mg/dL    Comment:        Total Cholesterol/HDL:CHD Risk Coronary Heart Disease Risk Table                     Men   Women  1/2 Average Risk   3.4   3.3  Average Risk       5.0   4.4  2 X Average Risk   9.6   7.1  3 X Average Risk  23.4   11.0        Use the calculated Patient Ratio above and the CHD Risk Table to determine the patient's CHD Risk.        ATP III CLASSIFICATION (LDL):  <100     mg/dL   Optimal  100-129  mg/dL   Near or Above                    Optimal  130-159  mg/dL   Borderline  160-189  mg/dL   High  >190     mg/dL   Very High   MRSA PCR Screening     Status: None   Collection Time: 03/18/17  6:20 AM  Result Value Ref Range   MRSA by PCR NEGATIVE NEGATIVE    Comment:        The GeneXpert MRSA Assay (FDA approved for NASAL specimens only), is one component of a comprehensive MRSA colonization surveillance program. It is not intended to diagnose MRSA infection nor to guide or monitor treatment for MRSA infections.   CBC     Status: None   Collection Time: 03/19/17  3:25 AM  Result Value Ref Range   WBC 5.8 4.0 - 10.5 K/uL   RBC 4.72 3.87 - 5.11 MIL/uL   Hemoglobin 13.2 12.0 - 15.0 g/dL    HCT 41.1 36.0 - 46.0 %   MCV 87.1 78.0 - 100.0 fL   MCH 28.0 26.0 - 34.0 pg   MCHC 32.1 30.0 - 36.0 g/dL   RDW 13.5 11.5 - 15.5 %   Platelets 330 150 - 400 K/uL     Lipid Panel     Component Value Date/Time   CHOL 176 03/18/2017 0406   TRIG 239 (H) 03/18/2017 0406   HDL 36 (L) 03/18/2017 0406   CHOLHDL 4.9 03/18/2017 0406   VLDL 48 (H) 03/18/2017 0406   LDLCALC 92 03/18/2017 0406  Lab Results  Component Value Date   HGBA1C 5.3 03/18/2017   HGBA1C 5.5 07/08/2016       HPI :*  26 y.o.femalewith medical history significant of hyperlipidemia, asthma, depression, PTSD, clotting disorder, TIA, CAD, thrombosis of right atrium, medication noncompliance to Eliquis, who presents withnumbness and weakness to her right leg, with inability to apply weight to her leg, in addition to RUE weakness and numbness.  and chest pain and shortness of breath. She had beenstarted on Eliquis in 2014 after surgical removal of the right atrial thrombus, but then stopped it in 2016 due to change of insurance and financial issues. She presented again to [facility] in late July 2017 with symptoms similar to today's presentation. TEE obtained during that admission on8/1/17 showed recurrence of right atrial thrombus: The atrium was mildly dilated. There was an apparent, small, irregular, highly mobile thrombus in the atrialcavity and appendage. There was an apparent, medium-sized fixed thrombus in the lateral atrial cavity. Patient has been noncompliant with eliquis   HOSPITAL COURSE:  Right-sided weakness CT head shows no intracranial abnormality.  MRI brain shows no acute intracranial abnormality. Stable nonspecific foci of T2 FLAIR hyperintensity within subcortical white matter of the frontal lobes are noted, possibly related to migraine headaches or vasculopathy; the hyperintensities are minimal to mild in extent.  Normal MRA of the head without contrast. Normal MRV of the head with and without  contrast. EKG EKG reveals sinus tachycardia. No atrial fibrillation. CT angiogram of chest is negative for PE or dissection. Right-sided weakness TTE to evaluate for progression versus resolution of right atrial thrombus. Results as above TEE recommended by cardiology Initially placed on heparin drip pending TEE TEE shows  1. Normal LV systolic function. 2. Right atrial thrombi. 3. No PFO. Eliquis resumed prior to discharge, 10 mg BID for one week, then  5 mg BID per cards  Case management consulted to evaluate for cost issues and to make sure that patient has PCP follow-up Frequent neuro checks , remained stable Carotid ultrasound-negative for stenosis Bilateral lower extremity Doppler negative for DVT Case management consult regarding insurance and inability to afford Eliquis prescription.  ok  For discharge per Dr Doylene Canard   Chest pain: Likely musculoskeletal.  CT angiogram of chest is negative for PE or dissection. Will need to rule out mural thrombosis and ACS. Troponin-I was normal and EKG showed sinus tachycardia. - prn percocet for pain - 2d echo--> results as above, essentially negative, pending TEE.  -Patient evaluated by Dr Doylene Canard, TEE as above  Triglycerides 239, LDL 92  LXB:WIOM LDL was 113 on 07/08/16, goal should be <70. Was on Lipitor, but not taking it. Triglycerides 239, LDL 92  Depression:Stable, no suicidal or homicidal ideations. -Continue home medications: Vilazodone  UTI (urinary tract infection): doubt patient has UTI. She does not have symptoms of UTI. Will discontinue -IV aztreonam    Dysphagia Patient complains of difficulty swallowing Negative  barium esophagogram,  patient started on PPI and Carafate for reflux which will be continued      Discharge Exam:  Blood pressure (!) 109/40, pulse 93, temperature 98.1 F (36.7 C), temperature source Oral, resp. rate (!) 22, height '5\' 3"'$  (1.6 m), weight 104.8 kg (231 lb 1.6 oz), last  menstrual period 02/14/2017, SpO2 99 %.  Cardiac: S1/S2, RRR, No murmurs, No gallops or rubs. Respiratory: No rales, wheezing, rhonchi or rubs. GI: Soft, nondistended, nontender, no rebound pain, no organomegaly, BS present. GU: No hematuria Ext: No pitting leg edema bilaterally.  2+DP/PT pulse bilaterally. Has right calf tenderness Musculoskeletal: No joint deformities, No joint redness or warmth, no limitation of ROM in spin. Skin: No rashes.  Neuro: Alert, oriented X3, cranial nerves II-XII grossly intact.         SignedReyne Dumas 03/19/2017, 11:36 AM        Time spent >45 mins

## 2017-03-19 NOTE — Transfer of Care (Signed)
Immediate Anesthesia Transfer of Care Note  Patient: [name]  Procedure(s) Performed: Procedure(s): TRANSESOPHAGEAL ECHOCARDIOGRAM (TEE) (N/A)  Patient Location: Endoscopy Unit  Anesthesia Type:MAC  Level of Consciousness: awake, alert  and oriented  Airway & Oxygen Therapy: Patient Spontanous Breathing and Patient connected to nasal cannula oxygen  Post-op Assessment: Report given to RN and Post -op Vital signs reviewed and stable  Post vital signs: Reviewed and stable  Last Vitals:  Vitals:   03/19/17 0255 03/19/17 0915  BP: (!) 99/37 113/74  Pulse: 85 98  Resp: 11 15  Temp: 36.6 C 36.7 C    Last Pain:  Vitals:   03/19/17 0915  TempSrc: Oral  PainSc:       Patients Stated Pain Goal: 5 (38/75/64 3329)  Complications: No apparent anesthesia complications

## 2017-03-20 LAB — CARDIOLIPIN ANTIBODIES, IGG, IGM, IGA
ANTICARDIOLIPIN IGM: 9 [MPL'U]/mL (ref 0–12)
Anticardiolipin IgG: <9 GPL U/mL (ref 0–14)

## 2017-03-20 LAB — BETA-2-GLYCOPROTEIN I ABS, IGG/M/A
Beta-2 Glyco I IgG: <9 GPI IgG units (ref 0–20)
Beta-2-Glycoprotein I IgA: <9 GPI IgA units (ref 0–25)

## 2017-03-21 ENCOUNTER — Encounter (HOSPITAL_COMMUNITY): Payer: Self-pay | Admitting: Cardiovascular Disease

## 2017-03-21 LAB — HOMOCYSTEINE: Homocysteine: 8.8 umol/L (ref 0.0–15.0)

## 2017-03-22 LAB — CULTURE, BLOOD (ROUTINE X 2)
Culture: NO GROWTH
Culture: NO GROWTH
SPECIAL REQUESTS: ADEQUATE
Special Requests: ADEQUATE

## 2017-03-24 LAB — FACTOR 5 LEIDEN

## 2017-03-24 LAB — PROTHROMBIN GENE MUTATION

## 2017-04-17 DIAGNOSIS — N949 Unspecified condition associated with female genital organs and menstrual cycle: Secondary | ICD-10-CM | POA: Insufficient documentation

## 2017-04-17 DIAGNOSIS — N939 Abnormal uterine and vaginal bleeding, unspecified: Secondary | ICD-10-CM | POA: Insufficient documentation

## 2017-04-17 DIAGNOSIS — R1030 Lower abdominal pain, unspecified: Secondary | ICD-10-CM | POA: Insufficient documentation

## 2017-05-22 ENCOUNTER — Encounter (HOSPITAL_COMMUNITY): Payer: Self-pay | Admitting: *Deleted

## 2017-05-22 ENCOUNTER — Emergency Department (HOSPITAL_COMMUNITY): Payer: Medicaid Other

## 2017-05-22 ENCOUNTER — Emergency Department (HOSPITAL_COMMUNITY)
Admission: EM | Admit: 2017-05-22 | Discharge: 2017-05-22 | Disposition: A | Discharge status: Home / Self Care | Payer: Medicaid Other | Attending: Emergency Medicine | Admitting: Emergency Medicine

## 2017-05-22 DIAGNOSIS — R079 Chest pain, unspecified: Secondary | ICD-10-CM | POA: Insufficient documentation

## 2017-05-22 DIAGNOSIS — Z7901 Long term (current) use of anticoagulants: Secondary | ICD-10-CM | POA: Insufficient documentation

## 2017-05-22 DIAGNOSIS — Z87891 Personal history of nicotine dependence: Secondary | ICD-10-CM | POA: Insufficient documentation

## 2017-05-22 DIAGNOSIS — I2510 Atherosclerotic heart disease of native coronary artery without angina pectoris: Secondary | ICD-10-CM | POA: Insufficient documentation

## 2017-05-22 DIAGNOSIS — R0602 Shortness of breath: Secondary | ICD-10-CM | POA: Insufficient documentation

## 2017-05-22 DIAGNOSIS — J45909 Unspecified asthma, uncomplicated: Secondary | ICD-10-CM | POA: Insufficient documentation

## 2017-05-22 DIAGNOSIS — Z8673 Personal history of transient ischemic attack (TIA), and cerebral infarction without residual deficits: Secondary | ICD-10-CM | POA: Insufficient documentation

## 2017-05-22 DIAGNOSIS — Z79899 Other long term (current) drug therapy: Secondary | ICD-10-CM | POA: Insufficient documentation

## 2017-05-22 LAB — CBC
HCT: 44.7 % (ref 36.0–46.0)
Hemoglobin: 14.4 g/dL (ref 12.0–15.0)
MCH: 27.9 pg (ref 26.0–34.0)
MCHC: 32.2 g/dL (ref 30.0–36.0)
MCV: 86.5 fL (ref 78.0–100.0)
Platelets: 408 10*3/uL — ABNORMAL HIGH (ref 150–400)
RBC: 5.17 MIL/uL — ABNORMAL HIGH (ref 3.87–5.11)
RDW: 13.2 % (ref 11.5–15.5)
WBC: 6.2 10*3/uL (ref 4.0–10.5)

## 2017-05-22 LAB — BASIC METABOLIC PANEL
Anion gap: 9 (ref 5–15)
BUN: 5 mg/dL — ABNORMAL LOW (ref 6–20)
CALCIUM: 8.6 mg/dL — AB (ref 8.9–10.3)
CO2: 23 mmol/L (ref 22–32)
CREATININE: 0.75 mg/dL (ref 0.44–1.00)
Chloride: 103 mmol/L (ref 101–111)
GFR calc non Af Amer: >60 mL/min (ref >60)
GLUCOSE: 96 mg/dL (ref 65–99)
Potassium: 3.8 mmol/L (ref 3.5–5.1)
Sodium: 135 mmol/L (ref 135–145)

## 2017-05-22 LAB — I-STAT TROPONIN, ED
TROPONIN I, POC: 0 ng/mL (ref 0.00–0.08)
Troponin i, poc: 0 ng/mL (ref 0.00–0.08)

## 2017-05-22 MED ORDER — ONDANSETRON 4 MG PO TBDP
4.0000 mg | ORAL_TABLET | Freq: Once | ORAL | Status: AC
  Administered 2017-05-22: 4 mg via ORAL
  Filled 2017-05-22: qty 1

## 2017-05-22 MED ORDER — PROMETHAZINE HCL 25 MG PO TABS
25.0000 mg | ORAL_TABLET | ORAL | Status: AC
  Administered 2017-05-22: 25 mg via ORAL
  Filled 2017-05-22: qty 1

## 2017-05-22 MED ORDER — OXYCODONE-ACETAMINOPHEN 5-325 MG PO TABS
1.0000 | ORAL_TABLET | Freq: Once | ORAL | Status: AC
  Administered 2017-05-22: 1 via ORAL
  Filled 2017-05-22: qty 1

## 2017-05-22 NOTE — ED Triage Notes (Signed)
Pt c/o R sided chest pain with SOB onset an hour ago.

## 2017-05-22 NOTE — Discharge Instructions (Signed)
Your cardiac workup today was normal. I recommend that you follow-up with Dr. Algie CofferKadakia if you have any ongoing issues.  Make sure to stay on your Eliquis. Return here for any new or worsening symptoms.

## 2017-05-22 NOTE — ED Provider Notes (Signed)
[facility]-EMERGENCY DEPT Provider Note   CSN: [id number] Arrival date & time: 05/22/17  0506     History   Chief Complaint Chief Complaint  Patient presents with  . Chest Pain  . Shortness of Breath    HPI [name] is a 26 y.o. female.  The history is provided by the patient and medical records.  Chest Pain   Associated symptoms include shortness of breath.  Shortness of Breath  Associated symptoms include chest pain.     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  Patient states this started around 5am this morning.  States pain localized to right chest and radiates into her right axilla.  States she has a little pain in the arm but mostly the axilla.  States she feels like she is working harder to get a deep breath, but does not feel she is truly short of breath. She did notice a rapid heartbeat upon waking this morning, but denies this currently. She's not had any fever, chills, cough, or other upper respiratory symptoms. Patient does have history of atrial thrombus, currently on Eliquis. States she has been complaint with her medications.  States she has never had a cardiac catheterization.  She last had a TEE in April 2018 which did note some residual thrombus.  It was recommended that she continue her eliquis.  She is not a smoker.  She is followed by cardiology, Dr. Algie Coffer.  Patient has had several CTA studies in the past, none of which showed PE.  Of note, per chart review patient was at [facility] earlier this morning for same compliant but apparently pulled out her IV and left AMA.  Past Medical History:  Diagnosis Date  . Asthma   . Coronary artery disease    clotting disorder  . Depression   . PTSD (post-traumatic stress disorder)   . Thrombosis of right atrium   . Tobacco abuse     Patient Active Problem List   Diagnosis  Date Noted  . Dysphagia   . Paresthesia   . Conversion disorder   . Noncompliance   . Clotting disorder (HCC)   . TIA (transient ischemic attack) 03/17/2017  . Right sided weakness 03/17/2017  . UTI (urinary tract infection) 03/17/2017  . 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 Calf pain   . Nausea with vomiting 07/08/2016  . Chest pain 07/07/2016  . Numbness on right side 07/07/2016  . Depression   . PTSD (post-traumatic stress disorder)   . 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 Tobacco abuse   . Atypical chest pain     Past Surgical History:  Procedure Laterality Date  . CARDIAC SURGERY    . CHOLECYSTECTOMY    . TEE WITHOUT CARDIOVERSION N/A 07/09/2016   Procedure: TRANSESOPHAGEAL ECHOCARDIOGRAM (TEE);  Surgeon: Orpah Cobb, MD;  Location: [facility] ENDOSCOPY;  Service: Cardiovascular;  Laterality: N/A;  . TEE WITHOUT CARDIOVERSION N/A 03/19/2017   Procedure: TRANSESOPHAGEAL ECHOCARDIOGRAM (TEE);  Surgeon: Orpah Cobb, MD;  Location: [facility] ENDOSCOPY;  Service: Cardiovascular;  Laterality: N/A;    OB History    No data available       Home Medications    Prior to Admission medications   Medication Sig Start Date End Date Taking? Authorizing Provider  acetaminophen (TYLENOL) 325 MG tablet Take 650 mg by mouth every 6 (six) hours as needed for mild pain.  [provider]  apixaban (ELIQUIS) 5 MG TABS tablet Take 1 tablet (5 mg total) by mouth 2 (two) times daily. 03/26/17   Richarda Overlie, MD  apixaban (ELIQUIS) 5 MG TABS tablet Take 2 tablets (10 mg total) by mouth 2 (two) times daily. 03/19/17 03/25/17  Richarda Overlie, MD  atorvastatin (LIPITOR) 10 MG tablet Take 1 tablet (10 mg total) by mouth daily at 6 PM. Patient not taking: Reported on 12/23/2016 07/10/16   Alison Murray, MD  hydrOXYzine (ATARAX/VISTARIL) 25 MG tablet Take 25 mg by mouth 2 (two) times daily as needed for anxiety.     [provider]  magic mouthwash w/lidocaine  SOLN Take 10 mLs by mouth 4 (four) times daily as needed (throat pain). Note to pharmacy - equal parts diphendydramine, aluminum hydroxide and lidocaine HCL Patient not taking: Reported on 03/16/2017 02/04/17   Burgess Amor, PA-C  ondansetron (ZOFRAN) 8 MG tablet Take 8 mg by mouth every 8 (eight) hours as needed for nausea or vomiting.    [provider]  pantoprazole sodium (PROTONIX) 40 mg/20 mL PACK Take 20 mLs (40 mg total) by mouth daily. 03/18/17   Richarda Overlie, MD  promethazine (PHENERGAN) 25 MG tablet Take 25 mg by mouth every 6 (six) hours as needed for nausea or vomiting.    [provider]  sucralfate (CARAFATE) 1 GM/10ML suspension Take 10 mLs (1 g total) by mouth 4 (four) times daily -  with meals and at bedtime. 03/18/17   Richarda Overlie, MD  Vilazodone HCl 20 MG TABS Take 40 mg by mouth daily.  10/16/16   [provider]    Family History Family History  Problem Relation Age of Onset  . Bipolar disorder Mother   . Glaucoma Mother   . Arthritis Father     Social History Social History  Substance Use Topics  . Smoking status: Former Smoker    Types: Cigarettes    Quit date: 12/09/2016  . Smokeless tobacco: Never Used  . Alcohol use No     Allergies   Fluoxetine; Ceftriaxone; Gabapentin; Morphine and related; Nsaids; Topamax [topiramate]; Toradol [ketorolac tromethamine]; Tramadol; Vicodin [hydrocodone-acetaminophen]; Depakote [divalproex sodium]; Methocarbamol; and Metoclopramide   Review of Systems Review of Systems  Respiratory: Positive for shortness of breath.   Cardiovascular: Positive for chest pain.  All other systems reviewed and are negative.    Physical Exam Updated Vital Signs BP 118/83   Pulse 87   Temp 97.9 F (36.6 C) (Oral)   Resp (!) 22   LMP 05/21/2017   SpO2 97%   Physical Exam  Constitutional: She is oriented to person, place, and time. She appears well-developed and well-nourished.  HENT:  Head: Normocephalic and  atraumatic.  Mouth/Throat: Oropharynx is clear and moist.  Eyes: Conjunctivae and EOM are normal. Pupils are equal, round, and reactive to light.  Neck: Normal range of motion.  Cardiovascular: Normal rate, regular rhythm and normal heart sounds.   Pulmonary/Chest: Effort normal and breath sounds normal. No respiratory distress. She has no wheezes.  Abdominal: Soft. Bowel sounds are normal.  Musculoskeletal: Normal range of motion.  Neurological: She is alert and oriented to person, place, and time.  Skin: Skin is warm and dry.  Psychiatric: She has a normal mood and affect.  Nursing note and vitals reviewed.    ED Treatments / Results  Labs (all labs ordered are listed, but only abnormal results are displayed) Labs Reviewed  BASIC METABOLIC PANEL - Abnormal; Notable for the  following:       Result Value   BUN 5 (*)    Calcium 8.6 (*)    All other components within normal limits  CBC - Abnormal; Notable for the following:    RBC 5.17 (*)    Platelets 408 (*)    All other components within normal limits  I-STAT TROPOININ, ED    EKG  EKG Interpretation  Date/Time:  Thursday May 22 2017 05:13:26 EDT Ventricular Rate:  105 PR Interval:  138 QRS Duration: 74 QT Interval:  350 QTC Calculation: 462 R Axis:   44 Text Interpretation:  Sinus tachycardia Nonspecific T wave abnormality Abnormal ECG flipped t in avl seen on aug 2017 Otherwise no significant change Confirmed by Melene PlanFloyd, Dan [phone]([id number]) on 05/22/2017 6:45:27 AM       Radiology Dg Chest 2 View  Result Date: 05/22/2017 CLINICAL DATA:  RIGHT-sided chest pain, shortness of breath for 1 hour. History of cardiac surgery and pulmonary emboli. EXAM: CHEST  2 VIEW COMPARISON:  Chest CT March 16, 2017 FINDINGS: Cardiomediastinal silhouette is normal. Status post median sternotomy. No pleural effusions or focal consolidations. Trachea projects midline and there is no pneumothorax. Soft tissue planes and included osseous structures  are non-suspicious. Surgical clips in the included right abdomen compatible with cholecystectomy. IMPRESSION: Stable examination:  No acute cardiopulmonary process. Electronically Signed   By: Awilda Metroourtnay  Bloomer M.D.   On: 05/22/2017 06:18    Procedures Procedures (including critical care time)  Medications Ordered in ED Medications  ondansetron (ZOFRAN-ODT) disintegrating tablet 4 mg (4 mg Oral Given 05/22/17 0613)  promethazine (PHENERGAN) tablet 25 mg (25 mg Oral Given 05/22/17 0651)  oxyCODONE-acetaminophen (PERCOCET/ROXICET) 5-325 MG per tablet 1 tablet (1 tablet Oral Given 05/22/17 [id number])     Initial Impression / Assessment and Plan / ED Course  I have reviewed the triage vital signs and the nursing notes.  Pertinent labs & imaging results that were available during my care of the patient were reviewed by me and considered in my medical decision making (see chart for details).  26 year old female here with chest pain. She's had multiple prior visits for similar. Based on chart review, [facility] and decided to leave AMA. She does have history of right atrial thrombus, however is chronically anticoagulated with eliquis.  Last TEE in April 2018.  Admits she has been compliant with this. Her EKG here is largely unchanged. Screening lab work is overall reassuring. Chest x-ray is clear. Patient was initially tachycardic to 104, however her rate in the 80s during my exam. She has no history of DVT or PE. She is anticoagulated so I feel PE is less likely. Patient has documented history of coronary artery disease, however has never had a cardiac catheterization. We'll plan for delta troponin. If this is negative, I feel she can be safely discharged home to follow-up with her cardiologist.  Delta troponin is negative.  Patient has been resting comfortably here.  Vitals remain stable.  Given negative evaluation here, feel she is stable for discharge home.   Encouraged to continue her eliquis.  Can follow-up with her cardiologist, Dr. Algie CofferKadakia.  Final Clinical Impressions(s) / ED Diagnoses   Final diagnoses:  Chest pain, unspecified type    New Prescriptions Discharge Medication List as of 05/22/2017  9:40 AM       Garlon HatchetSanders, Rozelia Catapano M, PA-C 05/22/17 1104    Melene PlanFloyd, Dan, DO 05/23/17 [id number]

## 2018-03-13 DIAGNOSIS — F17200 Nicotine dependence, unspecified, uncomplicated: Secondary | ICD-10-CM | POA: Insufficient documentation

## 2018-08-05 DIAGNOSIS — N939 Abnormal uterine and vaginal bleeding, unspecified: Secondary | ICD-10-CM | POA: Insufficient documentation

## 2018-08-11 DIAGNOSIS — R102 Pelvic and perineal pain: Secondary | ICD-10-CM | POA: Insufficient documentation

## 2019-08-09 LAB — HM PAP SMEAR: HM Pap smear: NEGATIVE

## 2020-02-16 DIAGNOSIS — N930 Postcoital and contact bleeding: Secondary | ICD-10-CM | POA: Insufficient documentation

## 2020-04-01 ENCOUNTER — Encounter (HOSPITAL_COMMUNITY): Payer: Self-pay | Admitting: Emergency Medicine

## 2020-04-01 ENCOUNTER — Emergency Department (HOSPITAL_COMMUNITY)
Admission: EM | Admit: 2020-04-01 | Discharge: 2020-04-01 | Disposition: A | Discharge status: Home / Self Care | Payer: Medicaid Other | Attending: Emergency Medicine | Admitting: Emergency Medicine

## 2020-04-01 ENCOUNTER — Emergency Department (HOSPITAL_COMMUNITY): Payer: Medicaid Other

## 2020-04-01 ENCOUNTER — Other Ambulatory Visit: Payer: Self-pay

## 2020-04-01 DIAGNOSIS — Z79899 Other long term (current) drug therapy: Secondary | ICD-10-CM | POA: Insufficient documentation

## 2020-04-01 DIAGNOSIS — Z8673 Personal history of transient ischemic attack (TIA), and cerebral infarction without residual deficits: Secondary | ICD-10-CM | POA: Insufficient documentation

## 2020-04-01 DIAGNOSIS — R1031 Right lower quadrant pain: Secondary | ICD-10-CM | POA: Diagnosis not present

## 2020-04-01 DIAGNOSIS — Z87891 Personal history of nicotine dependence: Secondary | ICD-10-CM | POA: Diagnosis not present

## 2020-04-01 DIAGNOSIS — J45909 Unspecified asthma, uncomplicated: Secondary | ICD-10-CM | POA: Diagnosis not present

## 2020-04-01 DIAGNOSIS — R1013 Epigastric pain: Secondary | ICD-10-CM

## 2020-04-01 DIAGNOSIS — Z7901 Long term (current) use of anticoagulants: Secondary | ICD-10-CM | POA: Insufficient documentation

## 2020-04-01 DIAGNOSIS — R112 Nausea with vomiting, unspecified: Secondary | ICD-10-CM | POA: Diagnosis not present

## 2020-04-01 DIAGNOSIS — R109 Unspecified abdominal pain: Secondary | ICD-10-CM | POA: Diagnosis present

## 2020-04-01 HISTORY — DX: Urinary tract infection, site not specified: N39.0

## 2020-04-01 LAB — URINALYSIS, ROUTINE W REFLEX MICROSCOPIC
Bacteria, UA: NONE SEEN
Bilirubin Urine: NEGATIVE
Glucose, UA: NEGATIVE mg/dL
Ketones, ur: NEGATIVE mg/dL
Leukocytes,Ua: NEGATIVE
Nitrite: NEGATIVE
Protein, ur: NEGATIVE mg/dL
Specific Gravity, Urine: 1.034 — ABNORMAL HIGH (ref 1.005–1.030)
pH: 6 (ref 5.0–8.0)

## 2020-04-01 LAB — COMPREHENSIVE METABOLIC PANEL
ALT: 9 U/L (ref 0–44)
AST: 15 U/L (ref 15–41)
Albumin: 3.5 g/dL (ref 3.5–5.0)
Alkaline Phosphatase: 51 U/L (ref 38–126)
Anion gap: 8 (ref 5–15)
BUN: 5 mg/dL — ABNORMAL LOW (ref 6–20)
CO2: 26 mmol/L (ref 22–32)
Calcium: 8.8 mg/dL — ABNORMAL LOW (ref 8.9–10.3)
Chloride: 104 mmol/L (ref 98–111)
Creatinine, Ser: 0.74 mg/dL (ref 0.44–1.00)
GFR calc Af Amer: >60 mL/min (ref >60)
GFR calc non Af Amer: >60 mL/min (ref >60)
Glucose, Bld: 90 mg/dL (ref 70–99)
Potassium: 3.7 mmol/L (ref 3.5–5.1)
Sodium: 138 mmol/L (ref 135–145)
Total Bilirubin: 0.7 mg/dL (ref 0.3–1.2)
Total Protein: 6.9 g/dL (ref 6.5–8.1)

## 2020-04-01 LAB — LIPASE, BLOOD: Lipase: 21 U/L (ref 11–51)

## 2020-04-01 LAB — CBC
HCT: 43.6 % (ref 36.0–46.0)
Hemoglobin: 14.3 g/dL (ref 12.0–15.0)
MCH: 28.7 pg (ref 26.0–34.0)
MCHC: 32.8 g/dL (ref 30.0–36.0)
MCV: 87.6 fL (ref 80.0–100.0)
Platelets: 385 10*3/uL (ref 150–400)
RBC: 4.98 MIL/uL (ref 3.87–5.11)
RDW: 12.2 % (ref 11.5–15.5)
WBC: 5.8 10*3/uL (ref 4.0–10.5)
nRBC: 0 % (ref 0.0–0.2)

## 2020-04-01 LAB — I-STAT BETA HCG BLOOD, ED (MC, WL, AP ONLY): I-stat hCG, quantitative: <5 m[IU]/mL (ref <5)

## 2020-04-01 LAB — TROPONIN I (HIGH SENSITIVITY): Troponin I (High Sensitivity): 2 ng/L (ref <18)

## 2020-04-01 MED ORDER — IOHEXOL 300 MG/ML  SOLN
100.0000 mL | Freq: Once | INTRAMUSCULAR | Status: AC | PRN
  Administered 2020-04-01: 100 mL via INTRAVENOUS

## 2020-04-01 MED ORDER — SODIUM CHLORIDE 0.9 % IV BOLUS
1000.0000 mL | Freq: Once | INTRAVENOUS | Status: AC
  Administered 2020-04-01: 1000 mL via INTRAVENOUS

## 2020-04-01 MED ORDER — FENTANYL CITRATE (PF) 100 MCG/2ML IJ SOLN
50.0000 ug | Freq: Once | INTRAMUSCULAR | Status: AC
  Administered 2020-04-01: 50 ug via INTRAVENOUS
  Filled 2020-04-01: qty 2

## 2020-04-01 MED ORDER — ONDANSETRON HCL 4 MG/2ML IJ SOLN
4.0000 mg | Freq: Once | INTRAMUSCULAR | Status: AC
  Administered 2020-04-01: 4 mg via INTRAVENOUS
  Filled 2020-04-01: qty 2

## 2020-04-01 MED ORDER — SODIUM CHLORIDE 0.9% FLUSH
3.0000 mL | Freq: Once | INTRAVENOUS | Status: AC
  Administered 2020-04-01: 18:00:00 3 mL via INTRAVENOUS

## 2020-04-01 MED ORDER — HYDROMORPHONE HCL 1 MG/ML IJ SOLN
0.2500 mg | Freq: Once | INTRAMUSCULAR | Status: DC
  Filled 2020-04-01: qty 1

## 2020-04-01 NOTE — ED Provider Notes (Signed)
[facility] EMERGENCY DEPARTMENT Provider Note   CSN: [id number] Arrival date & time: 04/01/20  1518     History Chief Complaint  Patient presents with  . Abdominal Pain  . Chest Pain    [name] is a 29 y.o. female with a pertinent past medical history of clotting disorder (unknown), stroke, PTSD, endometriosis, that presents to the emergency department for abdominal pain, chest pain that began 4 days ago.  Patient states that she was diagnosed with a UTI on 16 April and was started on Bactrim, she states that her symptoms started becoming worse so she came back to the emergency department and they did a CT renal which showed hydronephrosis.  They started her on Levaquin on the 21st.  She states after her first dose of Levaquin she started having intense abdominal pain.  She has been on Levaquin for 4 days now.  She states for the past 4 to 5 days she has felt nauseous and had nonbloody nonbilious vomiting about 3 times daily.  She also feels weak, denies any numbness tingling vision changes, headache, gait abnormalities.  She also admits to chest pain that started 2 days ago, she states that she feels like her chest is tight and she is having palpitations.  She denies any shortness of breath or radiation down her arm jaw neck.  In regards to her abdominal pain she states that it is sharp in her epigastrium and right lower quadrant.  She has been controlling her pain with 2000 of Tylenol daily.  She does have a pain clinic for her endometriosis and is prescribed oxycodone 7.5, however she has not been taking that because the pain provider said not to take this while she was on Levaquin.  She denies any pelvic pain, vaginal pain, vaginal discharge.  She states that she just began her period today.  She denies any fevers, chills, diarrhea, cough, shortness of breath, dizziness, headache.  She still complaining of dysuria, no hematuria urgency. She is on Eliquis.    Per chart  review, CT abdomen pelvis was negative for kidney stone there was mild hydronephrosis, they noted possible recent passage of a stone (4/21).  Urine culture on 4/22 showed no growth. HPI     Past Medical History:  Diagnosis Date  . Asthma   . Coronary artery disease    clotting disorder  . Depression   . PTSD (post-traumatic stress disorder)   . Thrombosis of right atrium   . Tobacco abuse   . UTI (urinary tract infection)     Patient Active Problem List   Diagnosis Date Noted  . Dysphagia   . Paresthesia   . Conversion disorder   . Noncompliance   . Clotting disorder ([place])   . TIA (transient ischemic attack) 03/17/2017  . Right sided weakness 03/17/2017  . UTI (urinary tract infection) 03/17/2017  . Stroke (cerebrum) ([place]) 03/17/2017  . Calf pain   . Nausea with vomiting 07/08/2016  . Chest pain 07/07/2016  . Numbness on right side 07/07/2016  . Depression   . PTSD (post-traumatic stress disorder)   . 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 Tobacco abuse   . Atypical chest pain     Past Surgical History:  Procedure Laterality Date  . CARDIAC SURGERY    . CHOLECYSTECTOMY    . TEE WITHOUT CARDIOVERSION N/A 07/09/2016   Procedure: TRANSESOPHAGEAL ECHOCARDIOGRAM (TEE);  Surgeon: Dixie Dials, MD;  Location: [facility];  Service: Cardiovascular;  Laterality: N/A;  . TEE WITHOUT CARDIOVERSION N/A 03/19/2017   Procedure: TRANSESOPHAGEAL ECHOCARDIOGRAM (TEE);  Surgeon: Orpah Cobb, MD;  Location: [facility] ENDOSCOPY;  Service: Cardiovascular;  Laterality: N/A;     OB History   No obstetric history on file.     Family History  Problem Relation Age of Onset  . Bipolar disorder Mother   . Glaucoma Mother   . Arthritis Father     Social History   Tobacco Use  . Smoking status: Former Smoker    Types: Cigarettes    Quit date: 12/09/2016    Years since quitting: 3.3  . Smokeless tobacco: Never Used  Substance Use Topics  . Alcohol use: No   . Drug use: No    Home Medications Prior to Admission medications   Medication Sig Start Date End Date Taking? Authorizing Provider  acetaminophen (TYLENOL) 325 MG tablet Take 650 mg by mouth every 6 (six) hours as needed for mild pain.    [provider]  apixaban (ELIQUIS) 5 MG TABS tablet Take 1 tablet (5 mg total) by mouth 2 (two) times daily. 03/26/17   Richarda Overlie, MD  apixaban (ELIQUIS) 5 MG TABS tablet Take 2 tablets (10 mg total) by mouth 2 (two) times daily. 03/19/17 03/25/17  Richarda Overlie, MD  atorvastatin (LIPITOR) 10 MG tablet Take 1 tablet (10 mg total) by mouth daily at 6 PM. Patient not taking: Reported on 12/23/2016 07/10/16   Alison Murray, MD  hydrOXYzine (ATARAX/VISTARIL) 25 MG tablet Take 25 mg by mouth 2 (two) times daily as needed for anxiety.     [provider]  magic mouthwash w/lidocaine SOLN Take 10 mLs by mouth 4 (four) times daily as needed (throat pain). Note to pharmacy - equal parts diphendydramine, aluminum hydroxide and lidocaine HCL Patient not taking: Reported on 03/16/2017 02/04/17   Burgess Amor, PA-C  ondansetron (ZOFRAN) 8 MG tablet Take 8 mg by mouth every 8 (eight) hours as needed for nausea or vomiting.    [provider]  pantoprazole sodium (PROTONIX) 40 mg/20 mL PACK Take 20 mLs (40 mg total) by mouth daily. 03/18/17   Richarda Overlie, MD  promethazine (PHENERGAN) 25 MG tablet Take 25 mg by mouth every 6 (six) hours as needed for nausea or vomiting.    [provider]  sucralfate (CARAFATE) 1 GM/10ML suspension Take 10 mLs (1 g total) by mouth 4 (four) times daily -  with meals and at bedtime. 03/18/17   Richarda Overlie, MD  Vilazodone HCl 20 MG TABS Take 40 mg by mouth daily.  10/16/16   [provider]    Allergies    Fluoxetine, Ceftriaxone, Gabapentin, Morphine and related, Nsaids, Topamax [topiramate], Toradol [ketorolac tromethamine], Tramadol, Vicodin [hydrocodone-acetaminophen], Depakote [divalproex  sodium], Methocarbamol, and Metoclopramide  Review of Systems   Review of Systems  Constitutional: Negative for chills, fatigue and fever.  HENT: Negative for ear pain, sinus pain and sore throat.   Respiratory: Positive for chest tightness. Negative for cough, choking, shortness of breath and wheezing.   Cardiovascular: Positive for chest pain and palpitations. Negative for leg swelling.  Gastrointestinal: Positive for abdominal pain and nausea.  Endocrine: Negative for polydipsia.  Genitourinary: Positive for dysuria. Negative for hematuria, urgency, vaginal bleeding and vaginal discharge.  Musculoskeletal: Positive for back pain (RIGHT ). Negative for gait problem, joint swelling, neck pain and neck stiffness.  Skin: Negative for color change and rash.  Neurological: Negative for dizziness, tremors, speech difficulty, light-headedness and numbness.  Psychiatric/Behavioral:  Negative for agitation and behavioral problems. The patient is not nervous/anxious.     Physical Exam Updated Vital Signs BP 116/61 (BP Location: Right Arm)   Pulse 64   Temp 97.8 F (36.6 C) (Oral)   Resp 16   Ht 5\' 3"  (1.6 m)   Wt 108.9 kg   LMP 04/01/2020   SpO2 98%   BMI 42.51 kg/m   Physical Exam .PE: Constitutional: well-developed, well-nourished, no apparent distress HENT: normocephalic, atraumatic Cardiovascular: normal rate and rhythm, distal pulses intact Pulmonary/Chest: effort normal; breath sounds clear and equal bilaterally; no wheezes or rales Abdominal: NBS, nondistended, moderate tenderness to epigastrium and right lower quadrant.  No guarding, rebound tenderness.  Positive McBurney's point tenderness.  Negative Murphy sign (patient has had gallbladder removed). Musculoskeletal: full ROM, no edema, no calf tenderness, right CVA tenderness, no midline tenderness on back   Lymphadenopathy: no cervical adenopathy Neurological: alert with goal directed thinking Skin: warm and dry, no rash,  no diaphoresis, bilateral upper eyelids with inflammation Psychiatric: normal mood and affect, normal behavior   ED Results / Procedures / Treatments   Labs (all labs ordered are listed, but only abnormal results are displayed) Labs Reviewed  COMPREHENSIVE METABOLIC PANEL - Abnormal; Notable for the following components:      Result Value   BUN 5 (*)    Calcium 8.8 (*)    All other components within normal limits  URINALYSIS, ROUTINE W REFLEX MICROSCOPIC - Abnormal; Notable for the following components:   Specific Gravity, Urine 1.034 (*)    Hgb urine dipstick LARGE (*)    All other components within normal limits  LIPASE, BLOOD  CBC  I-STAT BETA HCG BLOOD, ED ([facility], [facility], [facility] ONLY)  GC/CHLAMYDIA PROBE AMP ([place]) NOT AT [facility]  TROPONIN I (HIGH SENSITIVITY)    EKG None  Radiology CT Abdomen Pelvis W Contrast  Result Date: 04/01/2020 CLINICAL DATA:  Abdominal pain, right-sided flank pain EXAM: CT ABDOMEN AND PELVIS WITH CONTRAST TECHNIQUE: Multidetector CT imaging of the abdomen and pelvis was performed using the standard protocol following bolus administration of intravenous contrast. CONTRAST:  04/03/2020 OMNIPAQUE IOHEXOL 300 MG/ML  SOLN COMPARISON:  None. FINDINGS: Lower chest: No acute abnormality. Hepatobiliary: No focal liver abnormality is seen. Status post cholecystectomy. No biliary dilatation. Pancreas: Unremarkable. No pancreatic ductal dilatation or surrounding inflammatory changes. Spleen: Normal in size without significant abnormality. Adrenals/Urinary Tract: Adrenal glands are unremarkable. Kidneys are normal, without renal calculi, solid lesion, or hydronephrosis. Bladder is unremarkable. Stomach/Bowel: Stomach is within normal limits. Appendix appears normal. No evidence of bowel wall thickening, distention, or inflammatory changes. Vascular/Lymphatic: No significant vascular findings are present. No enlarged abdominal or pelvic lymph nodes. Reproductive: No mass or other  significant abnormality. Cysts and follicles of the bilateral ovaries. Tampon in the vagina. Other: No abdominal wall hernia or abnormality. No abdominopelvic ascites. Musculoskeletal: No acute or significant osseous findings. IMPRESSION: No acute CT findings of the abdomen or pelvis to explain right flank pain. No urinary tract calculus or hydronephrosis. Electronically Signed   By: M.D.   On: 04/01/2020 19:28    Procedures Procedures (including critical care time)  Medications Ordered in ED Medications  sodium chloride flush (NS) 0.9 % injection 3 mL (3 mLs Intravenous Given 04/01/20 1749)  iohexol (OMNIPAQUE) 300 MG/ML solution 100 mL (100 mLs Intravenous Contrast Given 04/01/20 1910)  sodium chloride 0.9 % bolus 1,000 mL (1,000 mLs Intravenous New Bag/Given 04/01/20 1938)  ondansetron (ZOFRAN) injection 4 mg (4  mg Intravenous Given 04/01/20 1951)  fentaNYL (SUBLIMAZE) injection 50 mcg (50 mcg Intravenous Given 04/01/20 1955)    ED Course  I have reviewed the triage vital signs and the nursing notes.  Pertinent labs & imaging results that were available during my care of the patient were reviewed by me and considered in my medical decision making (see chart for details).    MDM Rules/Calculators/A&P                      Caily Rakers is a 29 y.o. female with a pertinent past medical history of clotting disorder (on Eliquis), stroke, PTSD, endometriosis, that presents to the emergency department for abdominal pain, chest pain that began 4 days ago.  With right lower quadrant pain and anorexia, suspicious for appendicitis.  Will order CT scan of abdomen.  Work-up to also include CBC, CMP, i-STAT hCG, UA, lipase, troponin, EKG. started patient on IV fluids, IV Zofran and pain being controlled by fentanyl.  I discussed the utility of a pelvic exam to exclude pelvic pathology, however patient denied twice.  She states that she knows what her pelvic pain feels like due to her  endometriosis and this does not feel like that.  She denied any chances of STDs and states that she was just tested recently.  I explained that we would get a pelvic ultrasound if she had any pelvic pain however she did not want pelvic exam.  Labs in the emergency department were stable without any signs of leukocytosis or anemia. ACS unlikely due to negative troponin and normal EKG.  Chest pain most likely due to anxiety due to description of chest pain and patient's presentation.  Urinalysis without any signs of infection.  Patient is currently on her period.  Urine culture in process.  CT abdomen showed resolved hydronephrosis, did not show any acute abnormalities.  Do not think that her symptoms are due to a blood clot.  Patient states that this feels different from when she has had blood clots in the past.  Patient is on Eliquis.  Patient states that her back pain could be due to her starting her period since it does feel similar.  Reassessed patient and she states that she feels much better.  She states that her chest pain has been getting better.  She says that her pain has been controlled with the fentanyl and IV fluids.  Patient to be discharged.  Pain most likely due to side effects of Levaquin.  Will have patient discontinue Levaquin since there are no signs of urinary tract infection anywhere.  Patient does not need to be on antibiotic.  Stable vitals without any sings of infection. Pt feeling better, to be discharged. Pt is agreeable and happy that she does not need to be on Levaquin anymore. Told pt to follow up with PCP if she does not start feeling better. Told pt to come back to the emergency department if you start having continuous vomiting, inability to eat, fever, worsening abdominal pain, chills, weakness, worsening chest pain, shortness of breath, dizziness, leg swelling.  Discussed pt with PA-C [place] who agrees with plan and discharge.     Final Clinical Impression(s) / ED  Diagnoses Final diagnoses:  Right lower quadrant abdominal pain  Epigastric abdominal pain    Rx / DC Orders ED Discharge Orders    None       Farrel Gordon, PA-C 04/01/20 2355    Blane Ohara, MD 04/02/20 [phone]

## 2020-04-01 NOTE — Discharge Instructions (Addendum)
You were seen today in the emergency department for abdominal pain, chest pain.  Your blood work, EKG, CT of your abdomen were all reassuring.  Your urine looks clean without any infection.  I sent for another urine culture, you will see this on my chart.  Please see your PCP if you do not start feeling better in the next couple of days.  Come back to the emergency department if you start having continuous vomiting, inability to eat, fever, worsening abdominal pain, chills, weakness, worsening chest pain, shortness of breath, dizziness, leg swelling.  Stop taking the Levaquin as it seems that your urinary tract infection has resolved.  Your CT of your abdomen did not show any more hydronephrosis.

## 2020-04-01 NOTE — ED Notes (Signed)
Patient verbalizes understanding of discharge instructions. Opportunity for questioning and answers were provided. Armband removed by staff. Patient discharged from ED.  

## 2020-04-01 NOTE — ED Triage Notes (Signed)
Seen at pain clinic 8 days ago for endometriosis.  Started on Bactrim for UTI.  Tuesday she went to [facility] for R sided flank pain.  Was told she likely passed a kidney stone.  Rx was switched to Levaquin for a kidney infection.  Since starting Levaquin she states she is now having bilateral flank pain, abd pain, chest pain, nausea, and vomiting.  Seen at [facility] again 2 days ago and told UTI was better.  States she received a call today from [facility] stating UTI was worse.  Reports disorientation since yesterday. Reports pain to center of chest with palpitations.

## 2020-04-01 NOTE — ED Notes (Signed)
Pt ambulated to bathroom, unable to provide sample

## 2020-04-03 LAB — URINE CULTURE: Culture: <10000 — AB

## 2020-04-03 LAB — GC/CHLAMYDIA PROBE AMP (~~LOC~~) NOT AT ARMC
Chlamydia: NEGATIVE
Comment: NEGATIVE
Comment: NORMAL
Neisseria Gonorrhea: NEGATIVE

## 2020-07-09 DIAGNOSIS — U071 COVID-19: Secondary | ICD-10-CM | POA: Insufficient documentation

## 2020-07-09 HISTORY — DX: COVID-19: U07.1

## 2020-08-14 ENCOUNTER — Emergency Department: Admission: EM | Admit: 2020-08-14 | Discharge: 2020-08-14 | Discharge status: Absent without leave | Payer: Self-pay

## 2020-08-14 ENCOUNTER — Ambulatory Visit: Payer: Self-pay

## 2020-09-17 ENCOUNTER — Emergency Department (INDEPENDENT_AMBULATORY_CARE_PROVIDER_SITE_OTHER): Payer: Medicaid Other

## 2020-09-17 ENCOUNTER — Emergency Department (INDEPENDENT_AMBULATORY_CARE_PROVIDER_SITE_OTHER)
Admission: EM | Admit: 2020-09-17 | Discharge: 2020-09-17 | Disposition: A | Discharge status: Home / Self Care | Payer: Medicaid Other | Source: Home / Self Care | Attending: Family Medicine | Admitting: Family Medicine

## 2020-09-17 ENCOUNTER — Other Ambulatory Visit: Payer: Self-pay

## 2020-09-17 ENCOUNTER — Encounter: Payer: Self-pay | Admitting: Emergency Medicine

## 2020-09-17 DIAGNOSIS — R2241 Localized swelling, mass and lump, right lower limb: Secondary | ICD-10-CM | POA: Diagnosis not present

## 2020-09-17 DIAGNOSIS — M778 Other enthesopathies, not elsewhere classified: Secondary | ICD-10-CM

## 2020-09-17 DIAGNOSIS — M79671 Pain in right foot: Secondary | ICD-10-CM

## 2020-09-17 DIAGNOSIS — M76821 Posterior tibial tendinitis, right leg: Secondary | ICD-10-CM

## 2020-09-17 DIAGNOSIS — M25571 Pain in right ankle and joints of right foot: Secondary | ICD-10-CM | POA: Diagnosis not present

## 2020-09-17 NOTE — Discharge Instructions (Signed)
Wear ace wrap until swelling decreases.  Wear splint.  Apply ice pack for 20 to 30 minutes, 3 to 4 times daily  Continue until pain and swelling decrease.  May take Ibuprofen 200mg , 4 tabs every 8 hours with food.  Begin range of motion and stretching exercises as tolerated.

## 2020-09-17 NOTE — ED Provider Notes (Signed)
[name]    CSN: [id number] Arrival date & time: 09/17/20  1533      History   Chief Complaint Chief Complaint  Patient presents with  . Ankle Pain  . Foot Pain    HPI [name] is a 29 y.o. female.   Patient complains of pain in her right ankle and foot for three days without preceding injury or change in activities.  Yesterday she developed swelling on the lateral aspect of her ankle.  The history is provided by the patient.  Ankle Pain Location:  Foot and ankle Injury: no   Ankle location:  R ankle Foot location:  Dorsum of R foot Pain details:    Quality:  Aching   Radiates to:  Does not radiate   Severity:  Moderate   Onset quality:  Gradual   Duration:  3 days   Timing:  Constant   Progression:  Worsening Chronicity:  New Prior injury to area:  No Relieved by:  Elevation Worsened by:  Activity, bearing weight, extension and flexion Ineffective treatments:  None tried Associated symptoms: decreased ROM, stiffness and swelling   Associated symptoms: no fatigue, no fever, no muscle weakness, no numbness and no tingling   Risk factors: obesity     Past Medical History:  Diagnosis Date  . Asthma   . Coronary artery disease    clotting disorder  . COVID 07/2020  . Depression   . PTSD (post-traumatic stress disorder)   . Thrombosis of right atrium   . Tobacco abuse   . UTI (urinary tract infection)     Patient Active Problem List   Diagnosis Date Noted  . Dysphagia   . Paresthesia   . Conversion disorder   . Noncompliance   . Clotting disorder (HCC)   . TIA (transient ischemic attack) 03/17/2017  . Right sided weakness 03/17/2017  . UTI (urinary tract infection) 03/17/2017  . 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 Calf pain   . Nausea with vomiting 07/08/2016  . Chest pain 07/07/2016  . Numbness on right side 07/07/2016  . Depression   . PTSD (post-traumatic stress disorder)   . 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 Tobacco abuse   . Atypical chest pain     Past Surgical History:  Procedure Laterality Date  . CARDIAC SURGERY    . CHOLECYSTECTOMY    . TEE WITHOUT CARDIOVERSION N/A 07/09/2016   Procedure: TRANSESOPHAGEAL ECHOCARDIOGRAM (TEE);  Surgeon: Orpah Cobb, MD;  Location: [facility] ENDOSCOPY;  Service: Cardiovascular;  Laterality: N/A;  . TEE WITHOUT CARDIOVERSION N/A 03/19/2017   Procedure: TRANSESOPHAGEAL ECHOCARDIOGRAM (TEE);  Surgeon: Orpah Cobb, MD;  Location: [facility] ENDOSCOPY;  Service: Cardiovascular;  Laterality: N/A;    OB History   No obstetric history on file.      Home Medications    Prior to Admission medications   Medication Sig Start Date End Date Taking? Authorizing Provider  acetaminophen (TYLENOL) 325 MG tablet Take 650 mg by mouth every 6 (six) hours as needed for mild pain.    [provider]  apixaban (ELIQUIS) 5 MG TABS tablet Take 1 tablet (5 mg total) by mouth 2 (two) times daily. 03/26/17   Richarda Overlie, MD  apixaban (ELIQUIS) 5 MG TABS tablet Take 2 tablets (10 mg total) by mouth 2 (two) times daily. 03/19/17 03/25/17  Richarda Overlie, MD  atorvastatin (LIPITOR) 10 MG tablet Take 1 tablet (10 mg total) by mouth daily at 6 PM. Patient  not taking: Reported on 12/23/2016 07/10/16   Alison Murray, MD  hydrOXYzine (ATARAX/VISTARIL) 25 MG tablet Take 25 mg by mouth 2 (two) times daily as needed for anxiety.     [provider]  magic mouthwash w/lidocaine SOLN Take 10 mLs by mouth 4 (four) times daily as needed (throat pain). Note to pharmacy - equal parts diphendydramine, aluminum hydroxide and lidocaine HCL Patient not taking: Reported on 03/16/2017 02/04/17   Burgess Amor, PA-C  ondansetron (ZOFRAN) 8 MG tablet Take 8 mg by mouth every 8 (eight) hours as needed for nausea or vomiting.    [provider]  pantoprazole sodium (PROTONIX) 40 mg/20 mL PACK Take 20 mLs (40 mg total) by mouth daily. 03/18/17   Richarda Overlie, MD    promethazine (PHENERGAN) 25 MG tablet Take 25 mg by mouth every 6 (six) hours as needed for nausea or vomiting.    [provider]  sucralfate (CARAFATE) 1 GM/10ML suspension Take 10 mLs (1 g total) by mouth 4 (four) times daily -  with meals and at bedtime. 03/18/17   Richarda Overlie, MD  Vilazodone HCl 20 MG TABS Take 40 mg by mouth daily.  10/16/16   [provider]    Family History Family History  Problem Relation Age of Onset  . Bipolar disorder Mother   . Glaucoma Mother   . Arthritis Father     Social History Social History   Tobacco Use  . Smoking status: Former Smoker    Types: Cigarettes    Quit date: 12/09/2016    Years since quitting: 3.7  . Smokeless tobacco: Never Used  Substance Use Topics  . Alcohol use: No  . Drug use: No     Allergies   Fluoxetine, Ceftriaxone, Gabapentin, Morphine and related, Nsaids, Topamax [topiramate], Toradol [ketorolac tromethamine], Tramadol, Vicodin [hydrocodone-acetaminophen], Depakote [divalproex sodium], Methocarbamol, and Metoclopramide   Review of Systems Review of Systems  Constitutional: Negative for chills, diaphoresis, fatigue and fever.  Musculoskeletal: Positive for joint swelling and stiffness.  Skin: Negative for color change and wound.  All other systems reviewed and are negative.    Physical Exam Triage Vital Signs ED Triage Vitals  Enc Vitals Group     BP 09/17/20 1714 95/63     Pulse Rate 09/17/20 1714 77     Resp 09/17/20 1714 16     Temp 09/17/20 1714 (!) 97.5 F (36.4 C)     Temp Source 09/17/20 1714 Oral     SpO2 09/17/20 1714 98 %     Weight 09/17/20 1715 215 lb (97.5 kg)     Height 09/17/20 1715 5\' 3"  (1.6 m)     Head Circumference --      Peak Flow --      Pain Score 09/17/20 1715 7     Pain Loc --      Pain Edu? --      Excl. in GC? --    No data found.  Updated Vital Signs BP 95/63 (BP Location: Right Arm)   Pulse 77   Temp (!) 97.5 F (36.4 C) (Oral)   Resp 16    Ht 5\' 3"  (1.6 m)   Wt 97.5 kg   LMP 09/05/2020 (Exact Date)   SpO2 98%   BMI 38.09 kg/m   Visual Acuity Right Eye Distance:   Left Eye Distance:   Bilateral Distance:    Right Eye Near:   Left Eye Near:    Bilateral Near:  Physical Exam Vitals and nursing note reviewed.  Constitutional:      General: She is not in acute distress.    Appearance: She is obese.  HENT:     Head: Normocephalic.  Eyes:     Pupils: Pupils are equal, round, and reactive to light.  Cardiovascular:     Rate and Rhythm: Normal rate.  Pulmonary:     Effort: Pulmonary effort is normal.  Musculoskeletal:     Right ankle: Swelling present. No deformity, ecchymosis or lacerations. Tenderness present. Decreased range of motion.     Right Achilles Tendon: Normal.       Feet:     Comments: Right ankle has swelling over the lateral malleolus.  There is distinct tenderness to palpation over posterior tibial tendon extending into arch.  Pain elicited by resisted plantar flexion and resisted inversion of the ankle.  There is tenderness to palpation over the right foot extensor tendons.  Pain is elicited by resisted dorsiflexion of the right foot while palpating the extensor tendons.     Skin:    General: Skin is warm and dry.     Findings: No rash.  Neurological:     Mental Status: She is alert and oriented to person, place, and time.      UC Treatments / Results  Labs (all labs ordered are listed, but only abnormal results are displayed) Labs Reviewed - No data to display  EKG   Radiology DG Ankle Complete Right  Result Date: 09/17/2020 CLINICAL DATA:  Swelling EXAM: RIGHT ANKLE - COMPLETE 3+ VIEW; RIGHT FOOT COMPLETE - 3+ VIEW COMPARISON:  None. FINDINGS: No acute fracture or dislocation. Well corticated ossific density adjacent to the medial malleolus consistent with the sequela of remote prior trauma. Joint spaces and alignment are maintained. No area of erosion or osseous destruction. No  unexpected radiopaque foreign body. Soft tissue edema. IMPRESSION: 1. No acute fracture or dislocation of the right foot or ankle. 2. Soft tissue edema. Electronically Signed   By: Meda Klinefelter MD   On: 09/17/2020 17:23   DG Foot Complete Right  Result Date: 09/17/2020 CLINICAL DATA:  Swelling EXAM: RIGHT ANKLE - COMPLETE 3+ VIEW; RIGHT FOOT COMPLETE - 3+ VIEW COMPARISON:  None. FINDINGS: No acute fracture or dislocation. Well corticated ossific density adjacent to the medial malleolus consistent with the sequela of remote prior trauma. Joint spaces and alignment are maintained. No area of erosion or osseous destruction. No unexpected radiopaque foreign body. Soft tissue edema. IMPRESSION: 1. No acute fracture or dislocation of the right foot or ankle. 2. Soft tissue edema. Electronically Signed   By: Meda Klinefelter MD   On: 09/17/2020 17:23    Procedures Procedures (including critical care time)  Medications Ordered in UC Medications - No data to display  Initial Impression / Assessment and Plan / UC Course  I have reviewed the triage vital signs and the nursing notes.  Pertinent labs & imaging results that were available during my care of the patient were reviewed by me and considered in my medical decision making (see chart for details).    No obvious precipitating event or activity causing patient's tendonitis. Ace wrap applied.  Dispensed AirCast stirrup splint. Given treatment instructions with range of motion and stretching exercises.  Followup with Dr. Rodney [facility] ([facility]) if not improving about two weeks.    Final Clinical Impressions(s) / UC Diagnoses   Final diagnoses:  Posterior tibial tendonitis of right leg  Extensor tendonitis of  foot     Discharge Instructions     Wear ace wrap until swelling decreases.  Wear splint.  Apply ice pack for 20 to 30 minutes, 3 to 4 times daily  Continue until pain and swelling decrease.  May take  Ibuprofen 200mg , 4 tabs every 8 hours with food.  Begin range of motion and stretching exercises as tolerated.    ED Prescriptions    None        Lattie HawBeese, Armanie Martine A, MD 09/18/20 1032

## 2020-09-17 NOTE — ED Triage Notes (Signed)
Pain without injury for 3 days in right heel and ankle.

## 2020-11-01 ENCOUNTER — Other Ambulatory Visit: Payer: Self-pay

## 2020-11-01 ENCOUNTER — Emergency Department (HOSPITAL_BASED_OUTPATIENT_CLINIC_OR_DEPARTMENT_OTHER): Payer: Medicaid Other

## 2020-11-01 ENCOUNTER — Encounter (HOSPITAL_BASED_OUTPATIENT_CLINIC_OR_DEPARTMENT_OTHER): Payer: Self-pay

## 2020-11-01 ENCOUNTER — Emergency Department (HOSPITAL_BASED_OUTPATIENT_CLINIC_OR_DEPARTMENT_OTHER)
Admission: EM | Admit: 2020-11-01 | Discharge: 2020-11-01 | Disposition: A | Discharge status: Home / Self Care | Payer: Medicaid Other | Attending: Emergency Medicine | Admitting: Emergency Medicine

## 2020-11-01 DIAGNOSIS — Z8673 Personal history of transient ischemic attack (TIA), and cerebral infarction without residual deficits: Secondary | ICD-10-CM | POA: Insufficient documentation

## 2020-11-01 DIAGNOSIS — I2510 Atherosclerotic heart disease of native coronary artery without angina pectoris: Secondary | ICD-10-CM | POA: Insufficient documentation

## 2020-11-01 DIAGNOSIS — R0789 Other chest pain: Secondary | ICD-10-CM | POA: Diagnosis present

## 2020-11-01 DIAGNOSIS — F17210 Nicotine dependence, cigarettes, uncomplicated: Secondary | ICD-10-CM | POA: Insufficient documentation

## 2020-11-01 DIAGNOSIS — J45909 Unspecified asthma, uncomplicated: Secondary | ICD-10-CM | POA: Diagnosis not present

## 2020-11-01 DIAGNOSIS — Z7901 Long term (current) use of anticoagulants: Secondary | ICD-10-CM | POA: Diagnosis not present

## 2020-11-01 DIAGNOSIS — Z8616 Personal history of COVID-19: Secondary | ICD-10-CM | POA: Diagnosis not present

## 2020-11-01 DIAGNOSIS — R079 Chest pain, unspecified: Secondary | ICD-10-CM | POA: Diagnosis not present

## 2020-11-01 LAB — CBC
HCT: 45.5 % (ref 36.0–46.0)
Hemoglobin: 15.5 g/dL — ABNORMAL HIGH (ref 12.0–15.0)
MCH: 29.9 pg (ref 26.0–34.0)
MCHC: 34.1 g/dL (ref 30.0–36.0)
MCV: 87.8 fL (ref 80.0–100.0)
Platelets: 401 10*3/uL — ABNORMAL HIGH (ref 150–400)
RBC: 5.18 MIL/uL — ABNORMAL HIGH (ref 3.87–5.11)
RDW: 12.7 % (ref 11.5–15.5)
WBC: 10.8 10*3/uL — ABNORMAL HIGH (ref 4.0–10.5)
nRBC: 0 % (ref 0.0–0.2)

## 2020-11-01 LAB — COMPREHENSIVE METABOLIC PANEL
ALT: 8 U/L (ref 0–44)
AST: 15 U/L (ref 15–41)
Albumin: 4.2 g/dL (ref 3.5–5.0)
Alkaline Phosphatase: 43 U/L (ref 38–126)
Anion gap: 11 (ref 5–15)
BUN: 13 mg/dL (ref 6–20)
CO2: 25 mmol/L (ref 22–32)
Calcium: 8.8 mg/dL — ABNORMAL LOW (ref 8.9–10.3)
Chloride: 102 mmol/L (ref 98–111)
Creatinine, Ser: 0.98 mg/dL (ref 0.44–1.00)
GFR, Estimated: >60 mL/min (ref >60)
Glucose, Bld: 80 mg/dL (ref 70–99)
Potassium: 3.5 mmol/L (ref 3.5–5.1)
Sodium: 138 mmol/L (ref 135–145)
Total Bilirubin: 0.5 mg/dL (ref 0.3–1.2)
Total Protein: 7.9 g/dL (ref 6.5–8.1)

## 2020-11-01 LAB — TROPONIN I (HIGH SENSITIVITY)
Troponin I (High Sensitivity): <2 ng/L (ref <18)
Troponin I (High Sensitivity): <2 ng/L (ref <18)

## 2020-11-01 LAB — PREGNANCY, URINE: Preg Test, Ur: NEGATIVE

## 2020-11-01 MED ORDER — IOHEXOL 350 MG/ML SOLN
100.0000 mL | Freq: Once | INTRAVENOUS | Status: AC | PRN
  Administered 2020-11-01: 100 mL via INTRAVENOUS

## 2020-11-01 MED ORDER — ACETAMINOPHEN 325 MG PO TABS
650.0000 mg | ORAL_TABLET | Freq: Once | ORAL | Status: DC
  Filled 2020-11-01: qty 2

## 2020-11-01 MED ORDER — APIXABAN 2.5 MG PO TABS: 5.0000 mg | ORAL_TABLET | Freq: Once | ORAL | Status: DC

## 2020-11-01 MED ORDER — ONDANSETRON HCL 4 MG/2ML IJ SOLN
4.0000 mg | Freq: Once | INTRAMUSCULAR | Status: AC
  Administered 2020-11-01: 4 mg via INTRAVENOUS
  Filled 2020-11-01: qty 2

## 2020-11-01 NOTE — ED Triage Notes (Signed)
Pt c/o CP x 2 weeks-pain "tight" to right LE x 2 weeks-reports hx of DVT left LE and "to my heart"-pt noncompliant with eloquis-NAD-steady gait

## 2020-11-01 NOTE — ED Provider Notes (Signed)
Care of the patient was assumed from L. Layden PA-C at 800; see this provider's note for complete history of present illness, review of systems, and physical exam.  At time of handoff patient awaiting second troponin.  Second troponin was negative, patient to be discharged at this time.  Patient to pick up Eliquis tomorrow and get DVT study tomorrow, patient expressed understanding.  Return precautions given, patient to be discharged at this time.    Farrel Gordon, PA-C 11/01/20 2130    Alvira Monday, MD 11/02/20 1110

## 2020-11-01 NOTE — ED Provider Notes (Signed)
[facility] EMERGENCY DEPARTMENT Provider Note   CSN: [id number] Arrival date & time: 11/01/20  1701     History Chief Complaint  Patient presents with   Chest Pain    [name] is a 29 y.o. female with PMH/o Asthma, COVID, CAD, PTSD, right atrial thromboembolism who presents for evaluation of chest pain that is been intermittently occurring for the last 2 weeks.  Patient reports that over the last 2 weeks, she has had episodes where she gets a tight sharp pain in the right of her chest.  She states that this is not not tied to a particular action or exertion.  She states that she would have them randomly.  She did not get diaphoretic, nauseous or short of breath when she would have this.  She did not feel like it is worse when she took a deep breath in.  She she is also felt like her right calf has been tight and she would have pain in her right calf.  She states that she felt like when the pain happened in her chest, that would make the pain in her calf worse.  She has not noted any overlying warmth, erythema.  She denies any recent sicknesses, fevers, chills.  She did have COVID-19 in August 2021.  She does smoke about 3 cigarettes a day.  Denies any cocaine, heroin, marijuana use.  She states "she had blood clots removed from her heart" but no history of heart attack.  No family history of MI before the age of 31.  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  She supposed be on Eliquis but states he missed several doses.  She denies any OCP or exogenous hormone use, surgeries, travel, admissions.  The history is provided by the patient.    HPI: A 29 year old patient presents for evaluation of chest pain. Initial onset of pain was more than 6 hours ago. The patient's chest pain is described as heaviness/pressure/tightness and is not worse with exertion. The patient's chest pain is not middle- or left-sided, is not well-localized, is not sharp and does not radiate to the  arms/jaw/neck. The patient does not complain of nausea and denies diaphoresis. The patient has smoked in the past 90 days. The patient has no history of stroke, has no history of peripheral artery disease, denies any history of treated diabetes, has no relevant family history of coronary artery disease (first degree relative at less than age 31), is not hypertensive, has no history of hypercholesterolemia and does not have an elevated BMI (>=30).   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   Calf pain    Nausea with vomiting 07/08/2016   Chest pain 07/07/2016   Numbness on right side 07/07/2016   Depression    PTSD (post-traumatic stress disorder)    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  Tobacco abuse    Atypical chest pain     Past Surgical History:  Procedure Laterality Date   CARDIAC SURGERY     CHOLECYSTECTOMY     TEE WITHOUT CARDIOVERSION N/A 07/09/2016   Procedure: TRANSESOPHAGEAL ECHOCARDIOGRAM (TEE);  Surgeon: Orpah Cobb, MD;  Location: [facility] ENDOSCOPY;  Service: Cardiovascular;  Laterality: N/A;   TEE WITHOUT CARDIOVERSION N/A 03/19/2017   Procedure: TRANSESOPHAGEAL ECHOCARDIOGRAM (TEE);  Surgeon: Orpah Cobb, MD;  Location: [facility] ENDOSCOPY;  Service: Cardiovascular;  Laterality: N/A;     OB History   No obstetric history on file.     Family History  Problem Relation Age of Onset   Bipolar  disorder Mother    Glaucoma Mother    Arthritis Father     Social History   Tobacco Use   Smoking status: Current Every Day Smoker    Types: Cigarettes   Smokeless tobacco: Never Used  Vaping Use   Vaping Use: Never used  Substance Use Topics   Alcohol use: No   Drug use: No    Home Medications Prior to Admission medications   Medication Sig Start Date End Date Taking? Authorizing Provider  acetaminophen (TYLENOL) 325 MG tablet Take 650 mg by mouth every 6 (six) hours as needed for mild pain.    [provider]  apixaban (ELIQUIS) 5 MG TABS tablet Take 1 tablet (5 mg total) by mouth 2 (two) times daily. 03/26/17   Richarda Overlie, MD  apixaban (ELIQUIS) 5 MG TABS tablet Take 2 tablets (10 mg total) by mouth 2 (two) times daily. 03/19/17 03/25/17  Richarda Overlie, MD  atorvastatin (LIPITOR) 10 MG tablet Take 1 tablet (10 mg total) by mouth daily at 6 PM. Patient not taking: Reported on 12/23/2016 07/10/16   Alison Murray, MD  hydrOXYzine (ATARAX/VISTARIL) 25 MG tablet Take 25 mg by mouth 2 (two) times daily as needed for anxiety.     [provider]  magic mouthwash w/lidocaine SOLN Take 10 mLs by mouth 4 (four) times daily as needed (throat pain). Note to pharmacy - equal parts diphendydramine, aluminum hydroxide and lidocaine HCL Patient not taking: Reported on 03/16/2017 02/04/17   Burgess Amor, PA-C  ondansetron (ZOFRAN) 8 MG tablet Take 8 mg by mouth every 8 (eight) hours as needed for nausea or vomiting.    [provider]  pantoprazole sodium (PROTONIX) 40 mg/20 mL PACK Take 20 mLs (40 mg total) by mouth daily. 03/18/17   Richarda Overlie, MD  promethazine (PHENERGAN) 25 MG tablet Take 25 mg by mouth every 6 (six) hours as needed for nausea or vomiting.    [provider]  sucralfate (CARAFATE) 1 GM/10ML suspension Take 10 mLs (1 g total) by mouth 4 (four) times daily -  with meals and at bedtime. 03/18/17   Richarda Overlie, MD  Vilazodone HCl 20 MG TABS  Take 40 mg by mouth daily.  10/16/16   [provider]    Allergies    Fluoxetine, Ceftriaxone, Gabapentin, Morphine and related, Nsaids, Topamax [topiramate], Toradol [ketorolac tromethamine], Tramadol, Vicodin [hydrocodone-acetaminophen], Depakote [divalproex sodium], Methocarbamol, and Metoclopramide  Review of Systems   Review of Systems  Constitutional: Negative for fever.  Respiratory: Negative for cough and shortness of breath.   Cardiovascular: Positive for chest pain.  Gastrointestinal: Negative for abdominal pain, nausea and vomiting.  Genitourinary: Negative for dysuria and hematuria.  Musculoskeletal:       Leg pain  Neurological: Negative for weakness, numbness and headaches.  All other systems reviewed and  are negative.   Physical Exam Updated Vital Signs BP 105/72    Pulse 76    Temp 98.6 F (37 C) (Oral)    Resp 18    Ht  (1.6 m)    Wt 100.7 kg    LMP 10/08/2020    SpO2 99%    BMI 39.33 kg/m   Physical Exam Vitals and nursing note reviewed.  Constitutional:      Appearance: Normal appearance. She is well-developed.  HENT:     Head: Normocephalic and atraumatic.  Eyes:     General: Lids are normal.     Conjunctiva/sclera: Conjunctivae normal.     Pupils: Pupils are equal, round, and reactive to light.  Cardiovascular:     Rate and Rhythm: Normal rate and regular rhythm.     Pulses: Normal pulses.          Radial pulses are 2+ on the right side and 2+ on the left side.       Dorsalis pedis pulses are 2+ on the right side and 2+ on the left side.     Heart sounds: Normal heart sounds. No murmur heard.  No friction rub. No gallop.   Pulmonary:     Effort: Pulmonary effort is normal.     Breath sounds: Normal breath sounds.     Comments: Lungs clear to auscultation bilaterally.  Symmetric chest rise.  No wheezing, rales, rhonchi. Abdominal:     Palpations: Abdomen is soft. Abdomen is not rigid.     Tenderness: There is no abdominal tenderness.  There is no guarding.     Comments: Abdomen is soft, non-distended, non-tender. No rigidity, No guarding. No peritoneal signs.  Musculoskeletal:        General: Normal range of motion.     Cervical back: Full passive range of motion without pain.     Comments: Mild tenderness noted to right calf.  No overlying warmth, erythema, edema.  Skin:    General: Skin is warm and dry.     Capillary Refill: Capillary refill takes less than 2 seconds.     Comments: Good distal cap refill. RLE is not dusky in appearance or cool to touch.  Neurological:     Mental Status: She is alert and oriented to person, place, and time.  Psychiatric:        Speech: Speech normal.     ED Results / Procedures / Treatments   Labs (all labs ordered are listed, but only abnormal results are displayed) Labs Reviewed  CBC - Abnormal; Notable for the following components:      Result Value   WBC 10.8 (*)    RBC 5.18 (*)    Hemoglobin 15.5 (*)    Platelets 401 (*)    All other components within normal limits  COMPREHENSIVE METABOLIC PANEL - Abnormal; Notable for the following components:   Calcium 8.8 (*)    All other components within normal limits  PREGNANCY, URINE  TROPONIN I (HIGH SENSITIVITY)  TROPONIN I (HIGH SENSITIVITY)    EKG EKG Interpretation  Date/Time:  Wednesday November 01 2020 17:11:22 EST Ventricular Rate:  80 PR Interval:    QRS Duration: 77 QT Interval:  364 QTC Calculation: 420 R Axis:   51 Text Interpretation: Sinus rhythm No significant change since last tracing Confirmed by Alvira Monday ([id number]) on 11/01/2020 5:26:02 PM   Radiology DG Chest 2 View  Result Date: 11/01/2020 CLINICAL DATA:  Chest pain over the last 2 weeks. EXAM: CHEST -  2 VIEW COMPARISON:  06/21/2017 FINDINGS: Previous median sternotomy. Heart size is normal. The lungs are clear. Vascularity is normal. No infiltrate, mass, effusion or collapse. IMPRESSION: No active disease. Previous median sternotomy.  Electronically Signed   By: Paulina Fusi M.D.   On: 11/01/2020 19:03   CT Angio Chest PE W and/or Wo Contrast  Result Date: 11/01/2020 CLINICAL DATA:  Chest pain. EXAM: CT ANGIOGRAPHY CHEST WITH CONTRAST TECHNIQUE: Multidetector CT imaging of the chest was performed using the standard protocol during bolus administration of intravenous contrast. Multiplanar CT image reconstructions and MIPs were obtained to evaluate the vascular anatomy. CONTRAST:  OMNIPAQUE IOHEXOL 350 MG/ML SOLN COMPARISON:  March 16, 2017 FINDINGS: Cardiovascular: Satisfactory opacification of the pulmonary arteries to the segmental level. No evidence of pulmonary embolism. Normal heart size. No pericardial effusion. Mediastinum/Nodes: Multiple sternal wires are present. No enlarged mediastinal, hilar, or axillary lymph nodes. Thyroid gland, trachea, and esophagus demonstrate no significant findings. Lungs/Pleura: Lungs are clear. No pleural effusion or pneumothorax. Upper Abdomen: Surgical clips are seen within the gallbladder fossa. Musculoskeletal: No chest wall abnormality. No acute or significant osseous findings. Review of the MIP images confirms the above findings. IMPRESSION: 1. Prior median sternotomy without evidence of pulmonary embolism or acute cardiopulmonary disease. 2. Evidence of prior cholecystectomy. Electronically Signed   By: Aram Candela M.D.   On: 11/01/2020 19:48    Procedures Procedures (including critical care time)  Medications Ordered in ED Medications  ondansetron (ZOFRAN) injection 4 mg (4 mg Intravenous Given 11/01/20 1952)  iohexol (OMNIPAQUE) 350 MG/ML injection 100 mL (100 mLs Intravenous Contrast Given 11/01/20 1912)    ED Course  I have reviewed the triage vital signs and the nursing notes.  Pertinent labs & imaging results that were available during my care of the patient were reviewed by me and considered in my medical decision making (see chart for details).    MDM  Rules/Calculators/A&P HEAR Score: 2                        29 year old female past medical history of DVTs, not currently on Eliquis who presents for evaluation of chest pain x2 weeks. No SOB. No fever, chills. She also reports feeling like her right calf has been hurting. 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  On initial arrival, she is afebrile, nontoxic-appearing.  Vital signs are stable.  Low suspicion for ACS etiology given atypical features.  He does have history of right atrial thromboembolism but no personal history of MI.  She has family history of CAD.  PE is a consideration given her history of coagulation issues as well as noncompliance with Eliquis.  History/physical exam not concerning for dissection.  We will plan to check labs, EKG, chest x-ray.  Review of her records show that she had been seen by [facility] for evaluation of right atrial thromboembolism that have been removed.  She had been admitted and had been evaluated by cardiology during 2018.  Her chest pain was nonspecific at that time.  She had a TEE echo done in 2018 that was unremarkable.  CMP shows normal BUN and creatinine.  Initial troponin is negative.  CBC shows slight leukocytosis of 10.8.  Hemoglobin stable at 15.5.  Urine pregnancy test negative.  CTA shows no evidence of PE.  We will plan a delta troponin.  Given her history/risk factors, patient has a heart score of  2.  At this time, we unfortunately do not have ultrasound capability at our facility.  We will plan to schedule her an outpatient ultrasound for evaluation of DVT.  We will go ahead and restart her on her Eliquis and she is post to be taking it.  Patient reports that she just recently picked up the prescription.  I discussed with her that she needs to go back to her dosage.  Patient signed out to [facility], PA-C pending delta troponin.   Portions of this note were generated with [employer]. Dictation errors may occur despite best attempts at proofreading.   Final Clinical Impression(s) / ED Diagnoses Final diagnoses:  Nonspecific chest pain    Rx / DC Orders ED Discharge Orders         Ordered    US Venous Img Lower Unilateral Right        11/01/20 1913           Rosana Hoes 11/03/20 1636    Alvira Monday, MD 11/07/20 [phone]

## 2020-11-01 NOTE — Discharge Instructions (Addendum)
You have an ultrasound scheduled for tomorrow.  Please return to rule out a DVT.  We will need to restart you on your Eliquis. Please take your eliquis as instructed.   Please follow-up with your primary care doctor.  Return to the emergency department for any worsening chest pain, difficulty breathing or any other worsening concerning symptoms.

## 2020-12-10 ENCOUNTER — Emergency Department: Admit: 2020-12-10 | Discharge status: Against Medical Advice | Payer: Self-pay

## 2020-12-13 ENCOUNTER — Other Ambulatory Visit: Payer: Self-pay

## 2020-12-13 ENCOUNTER — Emergency Department (INDEPENDENT_AMBULATORY_CARE_PROVIDER_SITE_OTHER)
Admission: EM | Admit: 2020-12-13 | Discharge: 2020-12-13 | Disposition: A | Discharge status: Home / Self Care | Payer: Medicaid Other | Source: Home / Self Care

## 2020-12-13 DIAGNOSIS — Z20822 Contact with and (suspected) exposure to covid-19: Secondary | ICD-10-CM | POA: Diagnosis not present

## 2020-12-13 DIAGNOSIS — J069 Acute upper respiratory infection, unspecified: Secondary | ICD-10-CM

## 2020-12-13 MED ORDER — BENZONATATE 100 MG PO CAPS: 100.0000 mg | ORAL_CAPSULE | Freq: Three times a day (TID) | ORAL | 0 refills | Status: DC | PRN

## 2020-12-13 NOTE — Discharge Instructions (Addendum)
Continue tylenol for headache. Benzonatate for cough.  OTC Zyrtec or Xyzal for nasal symptoms. Your COVID 19 results will be available in 3-5 days. Negative results are immediately resulted to Mychart. Positive results will receive a follow-up call from our clinic. If symptoms are present, I recommend home quarantine until results are known.

## 2020-12-13 NOTE — ED Triage Notes (Signed)
Pt c/o covid like sxs since Friday. Started out sore throat. Cough over the weekend. Bodyaches and shortness of breath started yesterday.2 pts of hers tested pos. Also went out with friends over the weekend who she found out tested pos.

## 2020-12-14 NOTE — ED Provider Notes (Signed)
[facility]    CSN: [id number] Arrival date & time: 12/13/20  1033      History   Chief Complaint Chief Complaint  Patient presents with  . Cough  . Generalized Body Aches  . Shortness of Breath    HPI [name] is a 30 y.o. female.   HPI  Patient presents with URI symptoms including cough, sore throat, generalized body aches,HA nasal congestion. Known COVID exposure at work. Endorses SOB with increased activity.  Patient has a history of asthma. Denies worrisome symptoms of weakness, N&V, chest pain.  Past Medical History:  Diagnosis Date  . Asthma   . Coronary artery disease    clotting disorder  . COVID 07/2020  . Depression   . PTSD (post-traumatic stress disorder)   . Thrombosis of right atrium   . Tobacco abuse   . UTI (urinary tract infection)     Patient Active Problem List   Diagnosis Date Noted  . Dysphagia   . Paresthesia   . Conversion disorder   . Noncompliance   . Clotting disorder ([place])   . TIA (transient ischemic attack) 03/17/2017  . Right sided weakness 03/17/2017  . UTI (urinary tract infection) 03/17/2017  . Stroke (cerebrum) ([place]) 03/17/2017  . Calf pain   . Nausea with vomiting 07/08/2016  . Chest pain 07/07/2016  . Numbness on right side 07/07/2016  . Depression   . PTSD (post-traumatic stress disorder)   . 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 Tobacco abuse   . Atypical chest pain     Past Surgical History:  Procedure Laterality Date  . CARDIAC SURGERY    . CHOLECYSTECTOMY    . TEE WITHOUT CARDIOVERSION N/A 07/09/2016   Procedure: TRANSESOPHAGEAL ECHOCARDIOGRAM (TEE);  Surgeon: Dixie Dials, MD;  Location: [facility] ENDOSCOPY;  Service: Cardiovascular;  Laterality: N/A;  . TEE WITHOUT CARDIOVERSION N/A 03/19/2017   Procedure: TRANSESOPHAGEAL ECHOCARDIOGRAM (TEE);  Surgeon: Dixie Dials, MD;  Location: [facility] ENDOSCOPY;  Service: Cardiovascular;  Laterality: N/A;    OB History   No  obstetric history on file.      Home Medications    Prior to Admission medications   Medication Sig Start Date End Date Taking? Authorizing Provider  benzonatate (TESSALON) 100 MG capsule Take 1-2 capsules (100-200 mg total) by mouth 3 (three) times daily as needed for cough. 12/13/20  Yes Scot Jun, FNP  acetaminophen (TYLENOL) 325 MG tablet Take 650 mg by mouth every 6 (six) hours as needed for mild pain.    [provider]  apixaban (ELIQUIS) 5 MG TABS tablet Take 1 tablet (5 mg total) by mouth 2 (two) times daily. 03/26/17   Reyne Dumas, MD  apixaban (ELIQUIS) 5 MG TABS tablet Take 2 tablets (10 mg total) by mouth 2 (two) times daily. 03/19/17 03/25/17  Reyne Dumas, MD  atorvastatin (LIPITOR) 10 MG tablet Take 1 tablet (10 mg total) by mouth daily at 6 PM. Patient not taking: Reported on 12/23/2016 07/10/16   Robbie Lis, MD  hydrOXYzine (ATARAX/VISTARIL) 25 MG tablet Take 25 mg by mouth 2 (two) times daily as needed for anxiety.     [provider]  magic mouthwash w/lidocaine SOLN Take 10 mLs by mouth 4 (four) times daily as needed (throat pain). Note to pharmacy - equal parts diphendydramine, aluminum hydroxide and lidocaine HCL Patient not taking: Reported on 03/16/2017 02/04/17   Evalee Jefferson, PA-C  ondansetron (ZOFRAN) 8 MG tablet Take 8 mg by  mouth every 8 (eight) hours as needed for nausea or vomiting.    [provider]  pantoprazole sodium (PROTONIX) 40 mg/20 mL PACK Take 20 mLs (40 mg total) by mouth daily. 03/18/17   Richarda Overlie, MD  promethazine (PHENERGAN) 25 MG tablet Take 25 mg by mouth every 6 (six) hours as needed for nausea or vomiting.    [provider]  sucralfate (CARAFATE) 1 GM/10ML suspension Take 10 mLs (1 g total) by mouth 4 (four) times daily -  with meals and at bedtime. 03/18/17   Richarda Overlie, MD  Vilazodone HCl 20 MG TABS Take 40 mg by mouth daily.  10/16/16   [provider]    Family History Family  History  Problem Relation Age of Onset  . Bipolar disorder Mother   . Glaucoma Mother   . Arthritis Father     Social History Social History   Tobacco Use  . Smoking status: Current Every Day Smoker    Types: Cigarettes  . Smokeless tobacco: Never Used  Vaping Use  . Vaping Use: Never used  Substance Use Topics  . Alcohol use: No  . Drug use: No     Allergies   Fluoxetine, Ceftriaxone, Gabapentin, Morphine and related, Nsaids, Topamax [topiramate], Toradol [ketorolac tromethamine], Tramadol, Vicodin [hydrocodone-acetaminophen], Depakote [divalproex sodium], Methocarbamol, and Metoclopramide   Review of Systems Review of Systems Pertinent negatives listed in HPI  Physical Exam Triage Vital Signs ED Triage Vitals  Enc Vitals Group     BP 12/13/20 1121 98/66     Pulse Rate 12/13/20 1121 80     Resp 12/13/20 1121 17     Temp 12/13/20 1121 98.7 F (37.1 C)     Temp Source 12/13/20 1121 Oral     SpO2 12/13/20 1121 98 %     Weight --      Height --      Head Circumference --      Peak Flow --      Pain Score 12/13/20 1122 0     Pain Loc --      Pain Edu? --      Excl. in GC? --    No data found.  Updated Vital Signs BP 98/66 (BP Location: Right Arm)   Pulse 80   Temp 98.7 F (37.1 C) (Oral)   Resp 17   LMP 12/07/2020   SpO2 98%   Visual Acuity Right Eye Distance:   Left Eye Distance:   Bilateral Distance:    Right Eye Near:   Left Eye Near:    Bilateral Near:     Physical Exam  General Appearance:    Alert, cooperative, no distress  HENT:   Normocephalic, ears normal, nares mucosal edema with congestion, rhinorrhea, oropharynx non-erythematous/edematous  Eyes:    PERRL, conjunctiva/corneas clear, EOM's intact       Lungs:     Clear to auscultation bilaterally, respirations unlabored  Heart:    Regular rate and rhythm  Neurologic:   Awake, alert, oriented x 3. No apparent focal neurological           defect.     UC Treatments / Results   Labs (all labs ordered are listed, but only abnormal results are displayed) Labs Reviewed  COVID-19, FLU A+B NAA    EKG   Radiology No results found.  Procedures Procedures (including critical care time)  Medications Ordered in UC Medications - No data to display  Initial Impression / Assessment and Plan / UC  Course  I have reviewed the triage vital signs and the nursing notes.  Pertinent labs & imaging results that were available during my care of the patient were reviewed by me and considered in my medical decision making (see chart for details).    COVID/Flu test pending. Symptom management warranted only.  Manage fever with Tylenol and ibuprofen.  Nasal symptoms with over-the-counter antihistamines recommended.  Treatment per discharge medications/discharge instructions.  Red flags/ER precautions given. The most current CDC isolation/quarantine recommendation advised.   Final Clinical Impressions(s) / UC Diagnoses   Final diagnoses:  Close exposure to COVID-19 virus  Viral URI with cough     Discharge Instructions     Continue tylenol for headache. Benzonatate for cough.  OTC Zyrtec or Xyzal for nasal symptoms. Your COVID 19 results will be available in 3-5 days. Negative results are immediately resulted to Mychart. Positive results will receive a follow-up call from our clinic. If symptoms are present, I recommend home quarantine until results are known.    ED Prescriptions    Medication Sig Dispense Auth. Provider   benzonatate (TESSALON) 100 MG capsule Take 1-2 capsules (100-200 mg total) by mouth 3 (three) times daily as needed for cough. 40 capsule Bing Neighbors, FNP     PDMP not reviewed this encounter.   Bing Neighbors, [place] 12/19/20 [phone]

## 2020-12-15 LAB — COVID-19, FLU A+B NAA
Influenza A, NAA: NOT DETECTED
Influenza B, NAA: NOT DETECTED
SARS-CoV-2, NAA: NOT DETECTED

## 2020-12-30 ENCOUNTER — Other Ambulatory Visit: Payer: Self-pay

## 2020-12-30 ENCOUNTER — Other Ambulatory Visit (HOSPITAL_COMMUNITY)
Admission: RE | Admit: 2020-12-30 | Discharge: 2020-12-30 | Disposition: A | Discharge status: Home / Self Care | Payer: Medicaid Other | Source: Ambulatory Visit | Attending: Family Medicine | Admitting: Family Medicine

## 2020-12-30 ENCOUNTER — Encounter: Payer: Self-pay | Admitting: Emergency Medicine

## 2020-12-30 ENCOUNTER — Emergency Department
Admission: EM | Admit: 2020-12-30 | Discharge: 2020-12-30 | Disposition: A | Discharge status: Home / Self Care | Payer: Medicaid Other | Source: Home / Self Care

## 2020-12-30 DIAGNOSIS — R8290 Unspecified abnormal findings in urine: Secondary | ICD-10-CM | POA: Diagnosis not present

## 2020-12-30 DIAGNOSIS — N898 Other specified noninflammatory disorders of vagina: Secondary | ICD-10-CM | POA: Insufficient documentation

## 2020-12-30 DIAGNOSIS — R300 Dysuria: Secondary | ICD-10-CM | POA: Insufficient documentation

## 2020-12-30 LAB — POCT URINALYSIS DIP (MANUAL ENTRY)
Blood, UA: NEGATIVE
Glucose, UA: NEGATIVE mg/dL
Nitrite, UA: NEGATIVE
Protein Ur, POC: 30 mg/dL — AB
Spec Grav, UA: >=1.03 — AB (ref 1.010–1.025)
Urobilinogen, UA: 0.2 E.U./dL
pH, UA: 5.5 (ref 5.0–8.0)

## 2020-12-30 LAB — POCT URINE PREGNANCY: Preg Test, Ur: NEGATIVE

## 2020-12-30 NOTE — ED Triage Notes (Signed)
Patient states that she's had vaginal discharge x 3 days, yellowish in color and odors.  Possibly a STD.

## 2020-12-30 NOTE — ED Provider Notes (Signed)
[name]    CSN: [id number] Arrival date & time: 12/30/20  1419      History   Chief Complaint Chief Complaint  Patient presents with   Vaginal Discharge    HPI [name] is a 30 y.o. female.   HPI [name] is a 30 y.o. female presenting to UC with c/o yellowish vaginal discharge with an odor for about 3 days. Mild dysuria. Denies abdominal pain or back pain. Denies fever, n/v/d. LMP: 12/07/20, has had unprotected sex, requesting a pregnancy test. She has had a tubal ligation but reports becoming pregnant after that procedure.    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   Calf pain    Nausea with vomiting 07/08/2016   Chest pain 07/07/2016   Numbness on right side 07/07/2016   Depression    PTSD (post-traumatic stress disorder)    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    Tobacco abuse    Atypical chest pain     Past Surgical History:  Procedure Laterality Date   CARDIAC SURGERY     CHOLECYSTECTOMY     TEE WITHOUT CARDIOVERSION N/A 07/09/2016   Procedure: TRANSESOPHAGEAL ECHOCARDIOGRAM (TEE);  Surgeon: Orpah Cobb, MD;  Location: [facility] ENDOSCOPY;  Service: Cardiovascular;  Laterality: N/A;   TEE WITHOUT CARDIOVERSION N/A 03/19/2017   Procedure: TRANSESOPHAGEAL ECHOCARDIOGRAM (TEE);  Surgeon: Orpah Cobb, MD;  Location: [facility] ENDOSCOPY;  Service:  Cardiovascular;  Laterality: N/A;    OB History   No obstetric history on file.      Home Medications    Prior to Admission medications   Medication Sig Start Date End Date Taking? Authorizing Provider  acetaminophen (TYLENOL) 325 MG tablet Take 650 mg by mouth every 6 (six) hours as needed for mild pain.    [provider]  apixaban (ELIQUIS) 5 MG TABS tablet Take 1 tablet (5 mg total) by mouth 2 (two) times daily. 03/26/17   Richarda Overlie, MD  apixaban (ELIQUIS) 5 MG TABS tablet Take 2 tablets (10 mg total) by mouth 2 (two) times daily. 03/19/17 03/25/17  Richarda Overlie, MD  atorvastatin (LIPITOR) 10 MG tablet Take 1 tablet (10 mg total) by mouth daily at 6 PM. Patient not taking: Reported on 12/23/2016 07/10/16   Alison Murray, MD  benzonatate (TESSALON) 100 MG capsule Take 1-2 capsules (100-200 mg total) by mouth 3 (three) times daily as needed for cough. 12/13/20   Bing Neighbors, FNP  hydrOXYzine (ATARAX/VISTARIL) 25 MG tablet Take 25 mg by mouth 2 (two) times daily as needed for anxiety.     [provider]  magic mouthwash w/lidocaine SOLN Take 10 mLs by mouth 4 (four) times daily as needed (throat pain). Note to pharmacy - equal parts diphendydramine, aluminum hydroxide and lidocaine HCL Patient not taking: Reported on 03/16/2017 02/04/17   Burgess Amor, PA-C  ondansetron (ZOFRAN) 8 MG tablet Take 8 mg by mouth every 8 (eight) hours as needed for nausea or vomiting.    [provider]  pantoprazole sodium (PROTONIX) 40 mg/20 mL PACK Take 20 mLs (40 mg total) by mouth daily. 03/18/17   Richarda Overlie, MD  promethazine (PHENERGAN) 25 MG tablet Take 25 mg by mouth every 6 (six) hours as needed for nausea or vomiting.    [provider]  sucralfate (CARAFATE) 1 GM/10ML suspension Take 10 mLs (1 g total) by mouth 4 (four) times daily -  with meals and at bedtime. 03/18/17   Richarda Overlie, MD  Vilazodone HCl 20 MG TABS Take 40 mg by mouth daily.  10/16/16    [provider]    Family History Family History  Problem Relation Age of Onset   Bipolar disorder Mother    Glaucoma Mother    Arthritis Father     Social History Social History   Tobacco Use   Smoking status: Current Every Day Smoker    Types: Cigarettes   Smokeless tobacco: Never Used  Building services engineer Use: Never used  Substance Use Topics   Alcohol use: No   Drug use: No     Allergies   Fluoxetine, Ceftriaxone, Gabapentin, Morphine and related, Nsaids, Topamax [topiramate], Toradol [ketorolac tromethamine], Tramadol, Vicodin [hydrocodone-acetaminophen], Depakote [divalproex sodium], Methocarbamol, and Metoclopramide   Review of Systems Review of Systems  Constitutional: Negative for chills and fever.  Gastrointestinal: Negative for abdominal pain, diarrhea, nausea and vomiting.  Genitourinary: Positive for dysuria and vaginal discharge. Negative for pelvic pain, vaginal bleeding and vaginal pain.  Musculoskeletal: Negative for back pain.     Physical Exam Triage Vital Signs ED Triage Vitals  Enc Vitals Group     BP 12/30/20 1509 113/76     Pulse Rate 12/30/20 1509 98     Resp --      Temp --      Temp src --      SpO2 12/30/20 1509 98 %     Weight --      Height --      Head Circumference --      Peak Flow --      Pain Score 12/30/20 1510 0     Pain Loc --      Pain Edu? --      Excl. in GC? --    No data found.  Updated Vital Signs BP 113/76 (BP Location: Right Arm)    Pulse 98    LMP 12/07/2020    SpO2 98%   Visual Acuity Right Eye Distance:   Left Eye Distance:   Bilateral Distance:    Right Eye Near:   Left Eye Near:    Bilateral Near:     Physical Exam Vitals and nursing note reviewed. Exam conducted with a chaperone present.  Constitutional:      General: She is not in acute distress.    Appearance: Normal appearance. She is well-developed and well-nourished. She is not ill-appearing, toxic-appearing or  diaphoretic.  HENT:     Head: Normocephalic and atraumatic.  Eyes:     Extraocular Movements: EOM normal.  Cardiovascular:     Rate and Rhythm: Normal rate and regular rhythm.  Pulmonary:     Effort: Pulmonary effort is normal. No respiratory distress.     Breath sounds: Normal breath sounds.  Abdominal:     General: There is no distension.     Palpations: Abdomen is  soft.     Tenderness: There is no abdominal tenderness. There is no right CVA tenderness or left CVA tenderness.  Genitourinary:    Vagina: Vaginal discharge (moderate amount thin yellow-white discharge) present. No tenderness or bleeding.     Cervix: Discharge present.     Uterus: Normal.      Adnexa: Right adnexa normal and left adnexa normal.       Right: No mass or tenderness.         Left: No mass or tenderness.    Musculoskeletal:        General: Normal range of motion.     Cervical back: Normal range of motion.  Skin:    General: Skin is warm and dry.  Neurological:     Mental Status: She is alert and oriented to person, place, and time.  Psychiatric:        Mood and Affect: Mood and affect normal.        Behavior: Behavior normal.      UC Treatments / Results  Labs (all labs ordered are listed, but only abnormal results are displayed) Labs Reviewed  POCT URINALYSIS DIP (MANUAL ENTRY) - Abnormal; Notable for the following components:      Result Value   Color, UA other (*)    Bilirubin, UA moderate (*)    Ketones, POC UA moderate (40) (*)    Spec Grav, UA >=1.030 (*)    Protein Ur, POC =30 (*)    Leukocytes, UA Small (1+) (*)    All other components within normal limits  URINE CULTURE  POCT URINE PREGNANCY  CERVICOVAGINAL ANCILLARY ONLY    EKG   Radiology No results found.  Procedures Procedures (including critical care time)  Medications Ordered in UC Medications - No data to display  Initial Impression / Assessment and Plan / UC Course  I have reviewed the triage vital signs and  the nursing notes.  Pertinent labs & imaging results that were available during my care of the patient were reviewed by me and considered in my medical decision making (see chart for details).     Discussed UA with pt Urine culture sent, will hold off on empiric tx Vaginal swab sent to lab for testing F/u with PCP or GYN as needed  Final Clinical Impressions(s) / UC Diagnoses   Final diagnoses:  Dysuria  Vaginal discharge  Abnormal urine     Discharge Instructions      You will be notified within 3-4 days if medication is indicated based on your test results. Follow up with family medicine or gynecologist if symptoms not improving later this week.    ED Prescriptions    None     PDMP not reviewed this encounter.   Lurene Shadow, [place] 12/30/20 1546

## 2020-12-30 NOTE — Discharge Instructions (Signed)
  You will be notified within 3-4 days if medication is indicated based on your test results. Follow up with family medicine or gynecologist if symptoms not improving later this week.

## 2020-12-31 ENCOUNTER — Telehealth: Payer: Self-pay | Admitting: Emergency Medicine

## 2020-12-31 LAB — URINE CULTURE
MICRO NUMBER:: 11447374
Result:: NO GROWTH
SPECIMEN QUALITY:: ADEQUATE

## 2020-12-31 NOTE — Telephone Encounter (Signed)
Call from [place] regarding her lab results- pt was viewing U/A results done at visit yesterday. RN explained these results were reviewed by the provider during her visit & a urine culture was pending- should result in the next 2 days. Cervical swab would be picked up on Monday & patient should have results on wed or thursday

## 2021-01-01 ENCOUNTER — Telehealth: Payer: Self-pay | Admitting: Emergency Medicine

## 2021-01-01 NOTE — Telephone Encounter (Signed)
Pt called for lab results- urine culture is negative. STD testing has not resulted - collected on 12/30/20 - labs are not picked up for this type of testing until today - results should be final in 2 - 3 days. No change in symptoms from visit per pt

## 2021-01-02 ENCOUNTER — Telehealth (HOSPITAL_COMMUNITY): Payer: Self-pay | Admitting: Emergency Medicine

## 2021-01-02 LAB — CERVICOVAGINAL ANCILLARY ONLY
Bacterial Vaginitis (gardnerella): POSITIVE — AB
Candida Glabrata: NEGATIVE
Candida Vaginitis: NEGATIVE
Chlamydia: NEGATIVE
Comment: NEGATIVE
Comment: NEGATIVE
Comment: NEGATIVE
Comment: NEGATIVE
Comment: NEGATIVE
Comment: NORMAL
Neisseria Gonorrhea: NEGATIVE
Trichomonas: NEGATIVE

## 2021-01-02 MED ORDER — METRONIDAZOLE 500 MG PO TABS: 500.0000 mg | ORAL_TABLET | Freq: Two times a day (BID) | ORAL | 0 refills | Status: DC

## 2021-01-09 ENCOUNTER — Other Ambulatory Visit: Payer: Self-pay

## 2021-01-09 ENCOUNTER — Ambulatory Visit
Admission: EM | Admit: 2021-01-09 | Discharge: 2021-01-09 | Disposition: A | Discharge status: Home / Self Care | Payer: Medicaid Other | Attending: Emergency Medicine | Admitting: Emergency Medicine

## 2021-01-09 DIAGNOSIS — S8002XA Contusion of left knee, initial encounter: Secondary | ICD-10-CM | POA: Diagnosis not present

## 2021-01-09 DIAGNOSIS — S93401A Sprain of unspecified ligament of right ankle, initial encounter: Secondary | ICD-10-CM | POA: Diagnosis not present

## 2021-01-09 DIAGNOSIS — S63501A Unspecified sprain of right wrist, initial encounter: Secondary | ICD-10-CM | POA: Diagnosis not present

## 2021-01-09 NOTE — ED Provider Notes (Signed)
[facility]    CSN: [id number] Arrival date & time: 01/09/21  1148      History   Chief Complaint Chief Complaint  Patient presents with  . Fall    HPI [name] is a 30 y.o. female history of clotting disorder, tobacco use, presenting today for evaluation of wrist knee and ankle pain after fall.  Reports slipped and fell on ice approximately 2 days ago.  Since she has had pain in her right wrist, right ankle and left knee.  She denies concern for fracture and feels all of these areas are slowly sprained.  She denies hitting head or loss of consciousness.  She missed work today due to pain.  HPI  Past Medical History:  Diagnosis Date  . Asthma   . Coronary artery disease    clotting disorder  . COVID 07/2020  . Depression   . PTSD (post-traumatic stress disorder)   . Thrombosis of right atrium   . Tobacco abuse   . UTI (urinary tract infection)     Patient Active Problem List   Diagnosis Date Noted  . Dysphagia   . Paresthesia   . Conversion disorder   . Noncompliance   . Clotting disorder (HCC)   . TIA (transient ischemic attack) 03/17/2017  . Right sided weakness 03/17/2017  . UTI (urinary tract infection) 03/17/2017  . 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 Calf pain   . Nausea with vomiting 07/08/2016  . Chest pain 07/07/2016  . Numbness on right side 07/07/2016  . Depression   . PTSD (post-traumatic stress disorder)   . 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 Tobacco abuse   . Atypical chest pain     Past Surgical History:  Procedure Laterality Date  . CARDIAC SURGERY    . CHOLECYSTECTOMY    . TEE WITHOUT CARDIOVERSION N/A 07/09/2016   Procedure: TRANSESOPHAGEAL ECHOCARDIOGRAM (TEE);  Surgeon: Orpah Cobb, MD;  Location: [facility] ENDOSCOPY;  Service: Cardiovascular;  Laterality: N/A;  . TEE WITHOUT CARDIOVERSION N/A 03/19/2017   Procedure: TRANSESOPHAGEAL ECHOCARDIOGRAM (TEE);  Surgeon: Orpah Cobb, MD;   Location: [facility] ENDOSCOPY;  Service: Cardiovascular;  Laterality: N/A;    OB History   No obstetric history on file.      Home Medications    Prior to Admission medications   Medication Sig Start Date End Date Taking? Authorizing Provider  acetaminophen (TYLENOL) 325 MG tablet Take 650 mg by mouth every 6 (six) hours as needed for mild pain.    [provider]  apixaban (ELIQUIS) 5 MG TABS tablet Take 1 tablet (5 mg total) by mouth 2 (two) times daily. 03/26/17   Richarda Overlie, MD  apixaban (ELIQUIS) 5 MG TABS tablet Take 2 tablets (10 mg total) by mouth 2 (two) times daily. 03/19/17 03/25/17  Richarda Overlie, MD  atorvastatin (LIPITOR) 10 MG tablet Take 1 tablet (10 mg total) by mouth daily at 6 PM. Patient not taking: Reported on 12/23/2016 07/10/16   Alison Murray, MD  hydrOXYzine (ATARAX/VISTARIL) 25 MG tablet Take 25 mg by mouth 2 (two) times daily as needed for anxiety.     [provider]  magic mouthwash w/lidocaine SOLN Take 10 mLs by mouth 4 (four) times daily as needed (throat pain). Note to pharmacy - equal parts diphendydramine, aluminum hydroxide and lidocaine HCL Patient not taking: Reported on 03/16/2017 02/04/17   Burgess Amor, PA-C  metroNIDAZOLE (FLAGYL) 500 MG tablet Take 1 tablet (500 mg total) by mouth  2 (two) times daily. 01/02/21   Lamptey, Britta Mccreedy, MD  ondansetron (ZOFRAN) 8 MG tablet Take 8 mg by mouth every 8 (eight) hours as needed for nausea or vomiting.    [provider]  pantoprazole sodium (PROTONIX) 40 mg/20 mL PACK Take 20 mLs (40 mg total) by mouth daily. 03/18/17   Richarda Overlie, MD  promethazine (PHENERGAN) 25 MG tablet Take 25 mg by mouth every 6 (six) hours as needed for nausea or vomiting.    [provider]  sucralfate (CARAFATE) 1 GM/10ML suspension Take 10 mLs (1 g total) by mouth 4 (four) times daily -  with meals and at bedtime. 03/18/17   Richarda Overlie, MD  Vilazodone HCl 20 MG TABS Take 40 mg by mouth daily.  10/16/16    [provider]    Family History Family History  Problem Relation Age of Onset  . Bipolar disorder Mother   . Glaucoma Mother   . Arthritis Father     Social History Social History   Tobacco Use  . Smoking status: Current Every Day Smoker    Types: Cigarettes  . Smokeless tobacco: Never Used  Vaping Use  . Vaping Use: Never used  Substance Use Topics  . Alcohol use: No  . Drug use: No     Allergies   Fluoxetine, Ceftriaxone, Gabapentin, Morphine and related, Nsaids, Topamax [topiramate], Toradol [ketorolac tromethamine], Tramadol, Vicodin [hydrocodone-acetaminophen], Depakote [divalproex sodium], Methocarbamol, and Metoclopramide   Review of Systems Review of Systems  Constitutional: Negative for activity change, chills, diaphoresis and fatigue.  HENT: Negative for ear pain, tinnitus and trouble swallowing.   Eyes: Negative for photophobia and visual disturbance.  Respiratory: Negative for cough, chest tightness and shortness of breath.   Cardiovascular: Negative for chest pain and leg swelling.  Gastrointestinal: Negative for abdominal pain, blood in stool, nausea and vomiting.  Musculoskeletal: Positive for arthralgias, joint swelling and myalgias. Negative for back pain, gait problem, neck pain and neck stiffness.  Skin: Negative for color change and wound.  Neurological: Negative for dizziness, weakness, light-headedness, numbness and headaches.     Physical Exam Triage Vital Signs ED Triage Vitals  Enc Vitals Group     BP 01/09/21 1234 118/78     Pulse Rate 01/09/21 1234 89     Resp 01/09/21 1234 18     Temp 01/09/21 1234 98.1 F (36.7 C)     Temp Source 01/09/21 1234 Oral     SpO2 01/09/21 1234 97 %     Weight --      Height --      Head Circumference --      Peak Flow --      Pain Score 01/09/21 1235 6     Pain Loc --      Pain Edu? --      Excl. in GC? --    No data found.  Updated Vital Signs BP 118/78 (BP Location: Left Arm)    Pulse 89   Temp 98.1 F (36.7 C) (Oral)   Resp 18   LMP 01/02/2021   SpO2 97%   Visual Acuity Right Eye Distance:   Left Eye Distance:   Bilateral Distance:    Right Eye Near:   Left Eye Near:    Bilateral Near:     Physical Exam Vitals and nursing note reviewed.  Constitutional:      Appearance: She is well-developed and well-nourished.     Comments: No acute distress  HENT:  Head: Normocephalic and atraumatic.     Nose: Nose normal.  Eyes:     Conjunctiva/sclera: Conjunctivae normal.  Cardiovascular:     Rate and Rhythm: Normal rate.  Pulmonary:     Effort: Pulmonary effort is normal. No respiratory distress.  Abdominal:     General: There is no distension.  Musculoskeletal:        General: Normal range of motion.     Cervical back: Neck supple.     Comments: Right wrist: With distal radius and ulna tenderness, mild swelling, patient with full active range of motion with flexion and extension, radial pulse 2+, no snuffbox tenderness  Right ankle: Mild swelling noted to anterior lateral malleolus with superficial abrasion, tender to palpation in this area, dorsalis pedis 2+  Left knee: Tenderness to palpation about anterior knee, full active range of motion with flexion extension  Ambulating with mild antalgia  Skin:    General: Skin is warm and dry.  Neurological:     Mental Status: She is alert and oriented to person, place, and time.  Psychiatric:        Mood and Affect: Mood and affect normal.      UC Treatments / Results  Labs (all labs ordered are listed, but only abnormal results are displayed) Labs Reviewed - No data to display  EKG   Radiology No results found.  Procedures Procedures (including critical care time)  Medications Ordered in UC Medications - No data to display  Initial Impression / Assessment and Plan / UC Course  I have reviewed the triage vital signs and the nursing notes.  Pertinent labs & imaging results that were  available during my care of the patient were reviewed by me and considered in my medical decision making (see chart for details).     Patient declines concern for fractures and would like to defer imaging.  Treating as sprain, wrist brace provided for right wrist, Ace wrap to ankle and knee, ice elevate and continue with Tylenol, patient is on Eliquis, deferring NSAIDs. Discussed strict return precautions. Patient verbalized understanding and is agreeable with plan.   Final Clinical Impressions(s) / UC Diagnoses   Final diagnoses:  Sprain of right wrist, initial encounter  Sprain of right ankle, unspecified ligament, initial encounter  Contusion of left knee, initial encounter     Discharge Instructions     Tylenol 1000 mg every 4-6 hours as needed for pain Ice and elevate Wear wrist brace to limit movement at rest Ace wrap to help with swelling and compression on knee and ankle Follow-up if any symptoms not improving or worsening    ED Prescriptions    None     PDMP not reviewed this encounter.   Lew Dawes, [place] 01/09/21 1545

## 2021-01-09 NOTE — Discharge Instructions (Signed)
Tylenol 1000 mg every 4-6 hours as needed for pain Ice and elevate Wear wrist brace to limit movement at rest Ace wrap to help with swelling and compression on knee and ankle Follow-up if any symptoms not improving or worsening

## 2021-01-09 NOTE — ED Triage Notes (Signed)
Pt states slipped on ice and fell down brick steps 2 days ago. C/o rt wrist, lt knee, and rt ankle pain.

## 2021-03-07 ENCOUNTER — Other Ambulatory Visit: Payer: Self-pay

## 2021-03-07 ENCOUNTER — Ambulatory Visit
Admission: EM | Admit: 2021-03-07 | Discharge: 2021-03-07 | Disposition: A | Discharge status: Home / Self Care | Payer: Medicaid Other | Attending: Emergency Medicine | Admitting: Emergency Medicine

## 2021-03-07 DIAGNOSIS — N898 Other specified noninflammatory disorders of vagina: Secondary | ICD-10-CM | POA: Diagnosis present

## 2021-03-07 MED ORDER — METRONIDAZOLE 500 MG PO TABS: 500.0000 mg | ORAL_TABLET | Freq: Two times a day (BID) | ORAL | 0 refills | Status: AC

## 2021-03-07 NOTE — ED Provider Notes (Signed)
[facility]    CSN: [id number] Arrival date & time: 03/07/21  1857      History   Chief Complaint Chief Complaint  Patient presents with  . Foreign Body in Vagina    HPI [name] is a 30 y.o. female.   [name] presents with complaints of vaginal odor. Concerned about tampon in vagina as she noted odor and vaginal spotting today, despite no period for three days now. No specifically recalled tampon forgotten etc. Some pelvic cramping. Sexually active with 1 partner, no known std exposure. No gi symptoms. Has had BV in the past.     ROS per HPI, negative if not otherwise mentioned.      Past Medical History:  Diagnosis Date  . Asthma   . Coronary artery disease    clotting disorder  . COVID 07/2020  . Depression   . PTSD (post-traumatic stress disorder)   . Thrombosis of right atrium   . Tobacco abuse   . UTI (urinary tract infection)     Patient Active Problem List   Diagnosis Date Noted  . Dysphagia   . Paresthesia   . Conversion disorder   . Noncompliance   . Clotting disorder (HCC)   . TIA (transient ischemic attack) 03/17/2017  . Right sided weakness 03/17/2017  . UTI (urinary tract infection) 03/17/2017  . 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 Calf pain   . Nausea with vomiting 07/08/2016  . Chest pain 07/07/2016  . Numbness on right side 07/07/2016  . Depression   . PTSD (post-traumatic stress disorder)   . 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 Tobacco abuse   . Atypical chest pain     Past Surgical History:  Procedure Laterality Date  . CARDIAC SURGERY    . CHOLECYSTECTOMY    . TEE WITHOUT CARDIOVERSION N/A 07/09/2016   Procedure: TRANSESOPHAGEAL ECHOCARDIOGRAM (TEE);  Surgeon: Orpah Cobb, MD;  Location: [facility] ENDOSCOPY;  Service: Cardiovascular;  Laterality: N/A;  . TEE WITHOUT CARDIOVERSION N/A 03/19/2017   Procedure: TRANSESOPHAGEAL ECHOCARDIOGRAM (TEE);  Surgeon: Orpah Cobb, MD;  Location: [facility] ENDOSCOPY;  Service: Cardiovascular;  Laterality: N/A;    OB History   No obstetric history on file.      Home Medications    Prior to Admission medications   Medication Sig Start Date End Date Taking? Authorizing Provider  metroNIDAZOLE (FLAGYL) 500 MG tablet Take 1 tablet (500 mg total) by mouth 2 (two) times daily for 7 days. 03/07/21 03/14/21 Yes Carisha Kantor, Barron Alvine, NP  acetaminophen (TYLENOL) 325 MG tablet Take 650 mg by mouth every 6 (six) hours as needed for mild pain.    [provider]  apixaban (ELIQUIS) 5 MG TABS tablet Take 1 tablet (5 mg total) by mouth 2 (two) times daily. 03/26/17   Richarda Overlie, MD  apixaban (ELIQUIS) 5 MG TABS tablet Take 2 tablets (10 mg total) by mouth 2 (two) times daily. 03/19/17 03/25/17  Richarda Overlie, MD  ondansetron (ZOFRAN) 8 MG tablet Take 8 mg by mouth every 8 (eight) hours as needed for nausea or vomiting.    [provider]  pantoprazole sodium (PROTONIX) 40 mg/20 mL PACK Take 20 mLs (40 mg total) by mouth daily. 03/18/17   Richarda Overlie, MD  promethazine (PHENERGAN) 25 MG tablet Take 25 mg by mouth every 6 (six) hours as needed for nausea or vomiting.    [provider]  sucralfate (CARAFATE) 1 GM/10ML suspension Take 10 mLs (  1 g total) by mouth 4 (four) times daily -  with meals and at bedtime. 03/18/17   Richarda Overlie, MD  Vilazodone HCl 20 MG TABS Take 40 mg by mouth daily.  10/16/16   [provider]    Family History Family History  Problem Relation Age of Onset  . Bipolar disorder Mother   . Glaucoma Mother   . Arthritis Father     Social History Social History   Tobacco Use  . Smoking status: Current Every Day Smoker    Types: Cigarettes  . Smokeless tobacco: Never Used  Vaping Use  . Vaping Use: Never used  Substance Use Topics  . Alcohol use: No  . Drug use: No     Allergies   Fluoxetine, Ceftriaxone, Gabapentin, Morphine and related, Nsaids, Topamax  [topiramate], Toradol [ketorolac tromethamine], Tramadol, Vicodin [hydrocodone-acetaminophen], Depakote [divalproex sodium], Methocarbamol, and Metoclopramide   Review of Systems Review of Systems   Physical Exam Triage Vital Signs ED Triage Vitals  Enc Vitals Group     BP 03/07/21 2001 (!) 138/95     Pulse Rate 03/07/21 2001 90     Resp 03/07/21 2001 20     Temp 03/07/21 2001 99 F (37.2 C)     Temp Source 03/07/21 2001 Oral     SpO2 03/07/21 2001 97 %     Weight --      Height --      Head Circumference --      Peak Flow --      Pain Score 03/07/21 2004 7     Pain Loc --      Pain Edu? --      Excl. in GC? --    No data found.  Updated Vital Signs BP (!) 138/95 (BP Location: Left Arm)   Pulse 90   Temp 99 F (37.2 C) (Oral)   Resp 20   LMP 03/01/2021   SpO2 97%   Visual Acuity Right Eye Distance:   Left Eye Distance:   Bilateral Distance:    Right Eye Near:   Left Eye Near:    Bilateral Near:     Physical Exam Constitutional:      General: She is not in acute distress.    Appearance: She is well-developed.  Cardiovascular:     Rate and Rhythm: Normal rate.  Pulmonary:     Effort: Pulmonary effort is normal.  Genitourinary:    Cervix: No cervical bleeding.     Comments: No visible or palpable tampon present; scant vaginal discharge noted; no vaginal bleeding Skin:    General: Skin is warm and dry.  Neurological:     Mental Status: She is alert and oriented to person, place, and time.      UC Treatments / Results  Labs (all labs ordered are listed, but only abnormal results are displayed) Labs Reviewed  CERVICOVAGINAL ANCILLARY ONLY    EKG   Radiology No results found.  Procedures Procedures (including critical care time)  Medications Ordered in UC Medications - No data to display  Initial Impression / Assessment and Plan / UC Course  I have reviewed the triage vital signs and the nursing notes.  Pertinent labs & imaging results  that were available during my care of the patient were reviewed by me and considered in my medical decision making (see chart for details).     No tampon in place. BV contributing to odor? Flagyl initiated pending vaginal cytology. Return precautions provided. Patient verbalized understanding  and agreeable to plan.   Final Clinical Impressions(s) / UC Diagnoses   Final diagnoses:  Vaginal odor     Discharge Instructions     There was no tampon present tonight.  I am concerned about bacterial vaginosis as source of odor so have started flagyl, pending the results of your testing.  We will notify of you any positive findings or if any changes to treatment are needed. If normal or otherwise without concern to your results, we will not call you. Please log on to your MyChart to review your results if interested in so.      ED Prescriptions    Medication Sig Dispense Auth. Provider   metroNIDAZOLE (FLAGYL) 500 MG tablet Take 1 tablet (500 mg total) by mouth 2 (two) times daily for 7 days. 14 tablet Georgetta Haber, NP     PDMP not reviewed this encounter.   Georgetta Haber, NP 03/07/21 2112

## 2021-03-07 NOTE — Discharge Instructions (Addendum)
There was no tampon present tonight.  I am concerned about bacterial vaginosis as source of odor so have started flagyl, pending the results of your testing.  We will notify of you any positive findings or if any changes to treatment are needed. If normal or otherwise without concern to your results, we will not call you. Please log on to your MyChart to review your results if interested in so.

## 2021-03-07 NOTE — ED Triage Notes (Signed)
Pt states has tampon stuck x3 days in vagina. States now has a odor.

## 2021-03-09 LAB — CERVICOVAGINAL ANCILLARY ONLY
Bacterial Vaginitis (gardnerella): POSITIVE — AB
Candida Glabrata: NEGATIVE
Candida Vaginitis: NEGATIVE
Chlamydia: NEGATIVE
Comment: NEGATIVE
Comment: NEGATIVE
Comment: NEGATIVE
Comment: NEGATIVE
Comment: NEGATIVE
Comment: NORMAL
Neisseria Gonorrhea: NEGATIVE
Trichomonas: NEGATIVE

## 2021-04-04 ENCOUNTER — Telehealth: Payer: Self-pay | Admitting: *Deleted

## 2021-04-04 NOTE — Telephone Encounter (Signed)
Not able to schedule appointment at this time, at work. Will call her back tomorrow.

## 2021-04-26 ENCOUNTER — Encounter: Payer: Medicaid Other | Admitting: Obstetrics and Gynecology

## 2021-05-21 ENCOUNTER — Encounter: Payer: Self-pay | Admitting: Family Medicine

## 2021-05-21 ENCOUNTER — Encounter: Payer: Medicaid Other | Admitting: Family Medicine

## 2021-05-21 NOTE — Progress Notes (Signed)
Patient did not keep appointment today. She may call to reschedule.  

## 2021-06-05 ENCOUNTER — Telehealth: Payer: Self-pay | Admitting: *Deleted

## 2021-06-05 NOTE — Telephone Encounter (Signed)
Left patient an urgent message to call and reschedule appointment on 06/07/2021 due to Dr. Constant being out of work that day. 

## 2021-06-07 ENCOUNTER — Encounter: Payer: Medicaid Other | Admitting: Certified Nurse Midwife

## 2021-07-02 ENCOUNTER — Encounter: Payer: Medicaid Other | Admitting: Obstetrics & Gynecology

## 2021-07-23 ENCOUNTER — Other Ambulatory Visit: Payer: Self-pay

## 2021-07-23 ENCOUNTER — Emergency Department (INDEPENDENT_AMBULATORY_CARE_PROVIDER_SITE_OTHER)
Admission: RE | Admit: 2021-07-23 | Discharge: 2021-07-23 | Disposition: A | Discharge status: Home / Self Care | Payer: Medicaid Other | Source: Ambulatory Visit | Attending: Family Medicine | Admitting: Family Medicine

## 2021-07-23 VITALS — BP 139/71 | HR 75 | Temp 98.7°F | Resp 20 | Ht 63.0 in | Wt 217.0 lb

## 2021-07-23 DIAGNOSIS — L42 Pityriasis rosea: Secondary | ICD-10-CM

## 2021-07-23 NOTE — ED Provider Notes (Signed)
[name]    CSN: [id number] Arrival date & time: 07/23/21  1454      History   Chief Complaint Chief Complaint  Patient presents with   Rash    HPI Tallia Moehring is a 30 y.o. female.   HPI  Patient has a rash.  It started as 1 spot and now it is spread to her chest and her back.  It does not itch.  She went to see her primary care doctor.  Primary care doctor gave her cortisone cream without telling her what it was.  Patient is here for a "second opinion".  Wants to know what is causing the rash, and whether it is contagious.  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   Calf pain    Nausea with vomiting 07/08/2016   Chest pain 07/07/2016   Numbness on right side 07/07/2016   Depression    PTSD (post-traumatic stress disorder)    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      Home Medications    Prior to Admission medications   Medication Sig Start Date End Date Taking? Authorizing Provider  oxyCODONE-acetaminophen (PERCOCET) 10-325 MG tablet Take 1 tablet by mouth every 8 (eight) hours as needed for pain.   Yes [provider]  acetaminophen (TYLENOL) 325 MG tablet Take 650 mg by mouth every 6 (six) hours as needed for mild pain.    [provider]  apixaban (ELIQUIS) 5 MG TABS tablet Take 1 tablet (5 mg total) by mouth 2 (two) times daily. 03/26/17   Richarda Overlie, MD  apixaban (ELIQUIS) 5 MG TABS tablet Take 2 tablets (10 mg total) by mouth 2 (two) times daily. 03/19/17 03/25/17  Richarda Overlie, MD  ondansetron (ZOFRAN) 8 MG tablet Take 8 mg by mouth every 8 (eight) hours as needed for nausea or vomiting.    [provider]  pantoprazole sodium (PROTONIX) 40 mg/20 mL PACK Take 20 mLs (40 mg total) by mouth daily. 03/18/17   Richarda Overlie, MD  promethazine (PHENERGAN) 25 MG tablet Take 25 mg by mouth every 6 (six) hours as needed for nausea or vomiting.    [provider]  sucralfate (CARAFATE) 1 GM/10ML suspension Take 10 mLs (1 g total) by mouth 4 (four) times daily -  with meals and at bedtime. 03/18/17   Richarda Overlie, MD  Vilazodone HCl 20 MG TABS Take 40 mg by mouth daily.  10/16/16   [provider]    Family History Family History  Problem Relation Age of Onset   Bipolar disorder Mother    Glaucoma Mother    Arthritis Father     Social History Social History   Tobacco Use   Smoking status: Some Days    Types: Cigarettes   Smokeless tobacco: Never  Vaping Use   Vaping Use: Never used  Substance Use Topics   Alcohol use: No   Drug use: No     Allergies   Fluoxetine, Ceftriaxone, Gabapentin, Levaquin [levofloxacin], Morphine and related, Nsaids, Topamax [topiramate], Toradol [ketorolac tromethamine],  Tramadol, Vicodin [hydrocodone-acetaminophen], Depakote [divalproex sodium], Methocarbamol, and Metoclopramide   Review of Systems Review of Systems See HPI  Physical Exam Triage Vital Signs ED Triage Vitals  Enc Vitals Group     BP 07/23/21 1521 139/71     Pulse Rate 07/23/21 1521 75     Resp 07/23/21 1521 20     Temp 07/23/21 1521 98.7 F (37.1 C)     Temp Source 07/23/21 1521 Oral     SpO2 07/23/21 1521 98 %     Weight 07/23/21 1515 217 lb (98.4 kg)     Height 07/23/21 1515 5\' 3"  (1.6 m)     Head Circumference --      Peak Flow --      Pain Score 07/23/21 1514 0     Pain Loc --      Pain Edu? --      Excl. in GC? --    No data found.  Updated Vital Signs BP 139/71 (BP Location: Right Arm)   Pulse 75   Temp 98.7 F (37.1 C) (Oral)   Resp 20   Ht 5\' 3"  (1.6 m)   Wt 98.4 kg   LMP 06/26/2021 (Exact Date)   SpO2 98%   BMI 38.44 kg/m      Physical Exam Constitutional:      General: She is not in acute distress.    Appearance: Normal appearance. She is well-developed.  HENT:     Head: Normocephalic and atraumatic.     Mouth/Throat:     Comments: Mask is in place Eyes:     Conjunctiva/sclera: Conjunctivae normal.     Pupils: Pupils are equal, round, and reactive to light.  Cardiovascular:     Rate and Rhythm: Normal rate.  Pulmonary:     Effort: Pulmonary effort is normal. No respiratory distress.  Abdominal:     General: There is no distension.     Palpations: Abdomen is soft.  Musculoskeletal:        General: Normal range of motion.     Cervical back: Normal range of motion.  Skin:    General: Skin is warm and dry.     Comments: There is a 2-1/2 x 2 cm oval patch on the right breast.  There are multiple smaller oval papules, slightly raised, salmon-colored, Christmas tree distribution chest and back, spares arms legs neck and face  Neurological:     Mental Status: She is alert.     UC Treatments / Results  Labs (all labs ordered are listed, but  only abnormal results are displayed) Labs Reviewed - No data to display  EKG   Radiology No results found.  Procedures Procedures (including critical care time)  Medications Ordered in UC Medications - No data to display  Initial Impression / Assessment and Plan / UC Course  I have reviewed the triage vital signs and the nursing notes.  Pertinent labs & imaging results that were available during my care of the patient were reviewed by me and considered in my medical decision making (see chart for details).     I printed some information regarding pityriasis rosea a off the Internet to give patient.  Reassured this is not contagious. Final Clinical Impressions(s) / UC Diagnoses   Final diagnoses:  Pityriasis rosea     Discharge Instructions      May use cortisone ( steroid) cream if rash itches Otherwise usual lotions are fine May take antihistamines for itching - if needed No other treatment is needed   ED Prescriptions   None    PDMP not reviewed this encounter.   Eustace Moore, MD 07/23/21 [phone]

## 2021-07-23 NOTE — Discharge Instructions (Addendum)
May use cortisone ( steroid) cream if rash itches Otherwise usual lotions are fine May take antihistamines for itching - if needed No other treatment is needed

## 2021-07-23 NOTE — ED Triage Notes (Signed)
Pt presents to Urgent Care with c/o rash to chest, abdomen, and back x 2 weeks. States it does not itch. Reports wearing a new, unwashed scrub top approx 2 days prior to onset on rash; also states she began using a new detergent approx 2 weeks before onset.

## 2021-07-30 ENCOUNTER — Emergency Department (HOSPITAL_BASED_OUTPATIENT_CLINIC_OR_DEPARTMENT_OTHER)
Admission: EM | Admit: 2021-07-30 | Discharge: 2021-07-31 | Disposition: A | Discharge status: Home / Self Care | Payer: Medicaid Other | Attending: Emergency Medicine | Admitting: Emergency Medicine

## 2021-07-30 ENCOUNTER — Encounter (HOSPITAL_BASED_OUTPATIENT_CLINIC_OR_DEPARTMENT_OTHER): Payer: Self-pay

## 2021-07-30 ENCOUNTER — Other Ambulatory Visit: Payer: Self-pay

## 2021-07-30 DIAGNOSIS — Z8616 Personal history of COVID-19: Secondary | ICD-10-CM | POA: Insufficient documentation

## 2021-07-30 DIAGNOSIS — J45909 Unspecified asthma, uncomplicated: Secondary | ICD-10-CM | POA: Diagnosis not present

## 2021-07-30 DIAGNOSIS — Z7901 Long term (current) use of anticoagulants: Secondary | ICD-10-CM | POA: Insufficient documentation

## 2021-07-30 DIAGNOSIS — K5903 Drug induced constipation: Secondary | ICD-10-CM | POA: Diagnosis not present

## 2021-07-30 DIAGNOSIS — R1084 Generalized abdominal pain: Secondary | ICD-10-CM

## 2021-07-30 DIAGNOSIS — F17210 Nicotine dependence, cigarettes, uncomplicated: Secondary | ICD-10-CM | POA: Diagnosis not present

## 2021-07-30 HISTORY — DX: Constipation, unspecified: K59.00

## 2021-07-30 LAB — CBC WITH DIFFERENTIAL/PLATELET
Abs Immature Granulocytes: 0.01 10*3/uL (ref 0.00–0.07)
Basophils Absolute: 0 10*3/uL (ref 0.0–0.1)
Basophils Relative: 1 %
Eosinophils Absolute: 0.1 10*3/uL (ref 0.0–0.5)
Eosinophils Relative: 1 %
HCT: 44 % (ref 36.0–46.0)
Hemoglobin: 14.9 g/dL (ref 12.0–15.0)
Immature Granulocytes: 0 %
Lymphocytes Relative: 31 %
Lymphs Abs: 2.2 10*3/uL (ref 0.7–4.0)
MCH: 29.7 pg (ref 26.0–34.0)
MCHC: 33.9 g/dL (ref 30.0–36.0)
MCV: 87.6 fL (ref 80.0–100.0)
Monocytes Absolute: 0.6 10*3/uL (ref 0.1–1.0)
Monocytes Relative: 8 %
Neutro Abs: 4.3 10*3/uL (ref 1.7–7.7)
Neutrophils Relative %: 59 %
Platelets: 409 10*3/uL — ABNORMAL HIGH (ref 150–400)
RBC: 5.02 MIL/uL (ref 3.87–5.11)
RDW: 12.2 % (ref 11.5–15.5)
WBC: 7.2 10*3/uL (ref 4.0–10.5)
nRBC: 0 % (ref 0.0–0.2)

## 2021-07-30 LAB — URINALYSIS, ROUTINE W REFLEX MICROSCOPIC
Bilirubin Urine: NEGATIVE
Glucose, UA: NEGATIVE mg/dL
Hgb urine dipstick: NEGATIVE
Ketones, ur: NEGATIVE mg/dL
Leukocytes,Ua: NEGATIVE
Nitrite: NEGATIVE
Protein, ur: NEGATIVE mg/dL
Specific Gravity, Urine: 1.025 (ref 1.005–1.030)
pH: 6.5 (ref 5.0–8.0)

## 2021-07-30 LAB — PREGNANCY, URINE: Preg Test, Ur: NEGATIVE

## 2021-07-30 NOTE — ED Triage Notes (Addendum)
Pt reports constipation for one week associated with lower abdominal discomfort, unrelieved with Miralax. LBM today but it was not a lot. She takes oxycodone for chronic pelvic pain, last dose just PTA.

## 2021-07-30 NOTE — ED Provider Notes (Signed)
[facility] EMERGENCY DEPARTMENT Provider Note   CSN: [id number] Arrival date & time: 07/30/21  2112     History Chief Complaint  Patient presents with   Constipation    [name] is a 30 y.o. female.  HPI     This a 30 year old female with a history of UTI, constipation who presents with concerns for constipation.  Patient reports that over the last week she is only had 2 bowel movements.  She has had some lower abdominal discomfort that radiates into her umbilicus.  She has been taking MiraLAX as needed usually once every other day when she takes her pain medication.  She is prescribed oxycodone and usually takes it once per day.  She does not believe herself to be pregnant.  Reports nausea when she eats.  No vomiting.  Rates her pain at 4 out of 10.  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   Calf pain    Nausea with vomiting 07/08/2016   Chest pain 07/07/2016   Numbness on right side 07/07/2016   Depression    PTSD (post-traumatic stress disorder)    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    Tobacco abuse    Atypical chest pain     Past Surgical History:  Procedure Laterality Date   CARDIAC SURGERY     CHOLECYSTECTOMY     TEE WITHOUT CARDIOVERSION N/A 07/09/2016   Procedure: TRANSESOPHAGEAL ECHOCARDIOGRAM (TEE);  Surgeon: Orpah Cobb, MD;  Location: [facility] ENDOSCOPY;  Service: Cardiovascular;  Laterality: N/A;   TEE WITHOUT  CARDIOVERSION N/A 03/19/2017   Procedure: TRANSESOPHAGEAL ECHOCARDIOGRAM (TEE);  Surgeon: Orpah Cobb, MD;  Location: [facility] ENDOSCOPY;  Service: Cardiovascular;  Laterality: N/A;     OB History   No obstetric history on file.     Family History  Problem Relation Age of Onset   Bipolar disorder Mother    Glaucoma Mother    Arthritis Father     Social History   Tobacco Use   Smoking status: Some Days    Types: Cigarettes   Smokeless tobacco: Never  Vaping Use   Vaping Use: Never used  Substance Use Topics   Alcohol use: No   Drug use: No    Home Medications Prior to Admission medications   Medication Sig Start Date End Date Taking? Authorizing Provider  acetaminophen (TYLENOL) 325 MG tablet Take 650 mg by mouth every 6 (six) hours as needed for mild pain.    [provider]  apixaban (ELIQUIS) 5 MG TABS tablet Take 1 tablet (5 mg total) by mouth 2 (two) times daily. 03/26/17   Richarda Overlie, MD  apixaban (ELIQUIS) 5 MG TABS tablet Take 2 tablets (10 mg total) by mouth 2 (two) times daily. 03/19/17 03/25/17  Richarda Overlie, MD  ondansetron (ZOFRAN) 8 MG tablet Take 8 mg by mouth every 8 (eight) hours as needed for nausea or  vomiting.    [provider]  oxyCODONE-acetaminophen (PERCOCET) 10-325 MG tablet Take 1 tablet by mouth every 8 (eight) hours as needed for pain.    [provider]  pantoprazole sodium (PROTONIX) 40 mg/20 mL PACK Take 20 mLs (40 mg total) by mouth daily. 03/18/17   Richarda Overlie, MD  promethazine (PHENERGAN) 25 MG tablet Take 25 mg by mouth every 6 (six) hours as needed for nausea or vomiting.    [provider]  sucralfate (CARAFATE) 1 GM/10ML suspension Take 10 mLs (1 g total) by mouth 4 (four) times daily -  with meals and at bedtime. 03/18/17   Richarda Overlie, MD  Vilazodone HCl 20 MG TABS Take 40 mg by mouth daily.  10/16/16   [provider]    Allergies    Fluoxetine, Ceftriaxone, Gabapentin, Levaquin  [levofloxacin], Morphine and related, Nsaids, Topamax [topiramate], Toradol [ketorolac tromethamine], Tramadol, Vicodin [hydrocodone-acetaminophen], Depakote [divalproex sodium], Methocarbamol, and Metoclopramide  Review of Systems   Review of Systems  Constitutional:  Negative for fever.  Respiratory:  Negative for shortness of breath.   Cardiovascular:  Negative for chest pain.  Gastrointestinal:  Positive for abdominal pain, constipation and nausea. Negative for vomiting.  Genitourinary:  Negative for dysuria.  All other systems reviewed and are negative.  Physical Exam Updated Vital Signs BP 132/84 (BP Location: Left Arm)   Pulse 89   Temp 99 F (37.2 C) (Oral)   Resp 15   Ht 1.6 m (5\' 3" )   Wt 99.8 kg   LMP 07/25/2021 (Exact Date)   SpO2 99%   BMI 38.97 kg/m   Physical Exam Vitals and nursing note reviewed.  Constitutional:      Appearance: She is well-developed. She is obese.  HENT:     Head: Normocephalic and atraumatic.     Nose: Nose normal.     Mouth/Throat:     Pharynx: Oropharynx is clear.  Eyes:     Pupils: Pupils are equal, round, and reactive to light.  Cardiovascular:     Rate and Rhythm: Normal rate and regular rhythm.     Heart sounds: Normal heart sounds.  Pulmonary:     Effort: Pulmonary effort is normal. No respiratory distress.     Breath sounds: No wheezing.  Abdominal:     General: Bowel sounds are normal.     Palpations: Abdomen is soft.     Tenderness: There is no abdominal tenderness. There is no guarding or rebound.  Musculoskeletal:     Cervical back: Neck supple.  Skin:    General: Skin is warm and dry.  Neurological:     Mental Status: She is alert and oriented to person, place, and time.  Psychiatric:        Mood and Affect: Mood normal.    ED Results / Procedures / Treatments   Labs (all labs ordered are listed, but only abnormal results are displayed) Labs Reviewed  CBC WITH DIFFERENTIAL/PLATELET - Abnormal; Notable for the  following components:      Result Value   Platelets 409 (*)    All other components within normal limits  COMPREHENSIVE METABOLIC PANEL  LIPASE, BLOOD  URINALYSIS, ROUTINE W REFLEX MICROSCOPIC  PREGNANCY, URINE    EKG None  Radiology DG Abdomen 1 View  Result Date: 07/31/2021 CLINICAL DATA:  Constipation for 1 week. Lower abdominal discomfort. EXAM: ABDOMEN - 1 VIEW COMPARISON:  CT 04/01/2020 FINDINGS: Scattered gas and stool throughout the colon. No small or large bowel distention. No  radiopaque stones. Visualized bones and soft tissue contours appear intact. Surgical clips in the right upper quadrant and right inguinal region. Sternotomy wire. IMPRESSION: Normal obstructive bowel gas pattern. Electronically Signed   By: Burman Nieves M.D.   On: 07/31/2021 00:18    Procedures Procedures   Medications Ordered in ED Medications  ondansetron (ZOFRAN-ODT) disintegrating tablet 4 mg (4 mg Oral Given 07/31/21 0040)  dicyclomine (BENTYL) capsule 10 mg (10 mg Oral Given 07/31/21 0045)    ED Course  I have reviewed the triage vital signs and the nursing notes.  Pertinent labs & imaging results that were available during my care of the patient were reviewed by me and considered in my medical decision making (see chart for details).    MDM Rules/Calculators/A&P                           Patient presents with abdominal pain and constipation.  She is overall nontoxic and vital signs are reassuring.  Her abdominal exam is very benign.  Does report intermittent issues with constipation.  Considerations include but not limited to, worsening constipation, UTI, less likely appendicitis.  Patient has had a cholecystectomy.  Will obtain basic labs and a KUB.  Lab work is unremarkable.  Normal LFTs and lipase.  KUB read as normal; however, reviewed by myself.  Patient does seem to have significant stool burden mostly in the right colon.  Recommend increasing MiraLAX.  Patient is able to tolerate  fluids.  Increase bowel regimen and monitor symptoms closely.  After history, exam, and medical workup I feel the patient has been appropriately medically screened and is safe for discharge home. Pertinent diagnoses were discussed with the patient. Patient was given return precautions.  Final Clinical Impression(s) / ED Diagnoses Final diagnoses:  Generalized abdominal pain  Drug-induced constipation    Rx / DC Orders ED Discharge Orders     None        Quandre Polinski, Mayer Masker, MD 07/31/21 [phone]

## 2021-07-31 ENCOUNTER — Emergency Department (HOSPITAL_BASED_OUTPATIENT_CLINIC_OR_DEPARTMENT_OTHER): Payer: Medicaid Other

## 2021-07-31 ENCOUNTER — Encounter (HOSPITAL_COMMUNITY): Payer: Self-pay | Admitting: Emergency Medicine

## 2021-07-31 ENCOUNTER — Emergency Department (HOSPITAL_COMMUNITY)
Admission: EM | Admit: 2021-07-31 | Discharge: 2021-07-31 | Disposition: A | Discharge status: Home / Self Care | Payer: Medicaid Other | Source: Home / Self Care

## 2021-07-31 DIAGNOSIS — K5900 Constipation, unspecified: Secondary | ICD-10-CM | POA: Insufficient documentation

## 2021-07-31 DIAGNOSIS — Z5321 Procedure and treatment not carried out due to patient leaving prior to being seen by health care provider: Secondary | ICD-10-CM | POA: Insufficient documentation

## 2021-07-31 LAB — COMPREHENSIVE METABOLIC PANEL
ALT: 11 U/L (ref 0–44)
AST: 15 U/L (ref 15–41)
Albumin: 4.2 g/dL (ref 3.5–5.0)
Alkaline Phosphatase: 50 U/L (ref 38–126)
Anion gap: 8 (ref 5–15)
BUN: 11 mg/dL (ref 6–20)
CO2: 27 mmol/L (ref 22–32)
Calcium: 9.2 mg/dL (ref 8.9–10.3)
Chloride: 102 mmol/L (ref 98–111)
Creatinine, Ser: 0.76 mg/dL (ref 0.44–1.00)
GFR, Estimated: >60 mL/min (ref >60)
Glucose, Bld: 82 mg/dL (ref 70–99)
Potassium: 3.8 mmol/L (ref 3.5–5.1)
Sodium: 137 mmol/L (ref 135–145)
Total Bilirubin: 0.3 mg/dL (ref 0.3–1.2)
Total Protein: 7.8 g/dL (ref 6.5–8.1)

## 2021-07-31 LAB — LIPASE, BLOOD: Lipase: 30 U/L (ref 11–51)

## 2021-07-31 MED ORDER — ONDANSETRON 4 MG PO TBDP
4.0000 mg | ORAL_TABLET | Freq: Once | ORAL | Status: AC
  Administered 2021-07-31: 4 mg via ORAL
  Filled 2021-07-31: qty 1

## 2021-07-31 MED ORDER — DICYCLOMINE HCL 10 MG PO CAPS
10.0000 mg | ORAL_CAPSULE | Freq: Once | ORAL | Status: AC
  Administered 2021-07-31: 10 mg via ORAL
  Filled 2021-07-31: qty 1

## 2021-07-31 NOTE — ED Notes (Signed)
Pt still not in stretcher.

## 2021-07-31 NOTE — ED Triage Notes (Signed)
Pt back to the ED today with c/io abd pain , was seen at [facility] yesterday ,and saw her pcp today who wanted her to come to the ED for possible CT of her abd

## 2021-07-31 NOTE — ED Provider Notes (Signed)
Emergency Medicine Provider Triage Evaluation Note  [name] , a 30 y.o. female  was evaluated in triage.  Pt complains of requesting a CT scan.  She was seen at the ER overnight and went to her PCP today who she reports told her to come to the ER for consideration of a CAT scan.  She states that she normally takes fiber Gummies, she had run out of those and then was taking 1 dose of MiraLAX every other day as she takes opioids frequently.  She reports that she has chronic constipation.  She states that she is having severe cramping abdominal pain.  She has not made any changes since her ER visit overnight..  Review of Systems  Positive: Crampy intermittent abdominal pain, constipation Negative: Fevers, dysuria  Physical Exam  BP (!) 126/51 (BP Location: Right Arm)   Pulse 78   Temp 98.9 F (37.2 C) (Oral)   Resp 16   LMP 07/25/2021 (Exact Date)   SpO2 99%  Gen:   Awake, no distress   Resp:  Normal effort  MSK:   Moves extremities without difficulty  Other:  Abdomen is generally tender on exam  Medical Decision Making  Medically screening exam initiated at 1:57 PM.  Appropriate orders placed.  [name] was informed that the remainder of the evaluation will be completed by another provider, this initial triage assessment does not replace that evaluation, and the importance of remaining in the ED until their evaluation is complete.  I had extensive discussion with patient regarding CAT scan.  We discussed the risks of radiation and others.  She had labs obtained 14 hours ago including negative pregnancy test, labs were reassuring.  She does not acutely need repeat labs at this time.  Patient is adamant that she needs a CAT scan today.  After we discussed risks, benefits, and alternatives she requested CAT scan.  Given that she does have significant abdominal pain and has not had any recent imaging other than a abdomen 1 view taken very early this morning CAT scan is  ordered.  Note: Portions of this report may have been transcribed using voice recognition software. Every effort was made to ensure accuracy; however, inadvertent computerized transcription errors may be present    Cristina Gong, PA-C 07/31/21 1400    Rolan Bucco, MD 07/31/21 1451

## 2021-07-31 NOTE — Discharge Instructions (Addendum)
You were seen today for abdominal pain and constipation.  Increase MiraLAX to once daily and not just as needed.  Monitor your symptoms closely.  If not improving with better bowel regimen, follow-up with your primary physician.

## 2021-09-04 ENCOUNTER — Emergency Department (INDEPENDENT_AMBULATORY_CARE_PROVIDER_SITE_OTHER): Payer: Medicaid Other

## 2021-09-04 ENCOUNTER — Other Ambulatory Visit: Payer: Self-pay

## 2021-09-04 ENCOUNTER — Emergency Department (INDEPENDENT_AMBULATORY_CARE_PROVIDER_SITE_OTHER)
Admission: RE | Admit: 2021-09-04 | Discharge: 2021-09-04 | Disposition: A | Discharge status: Home / Self Care | Payer: Medicaid Other | Source: Ambulatory Visit | Attending: Family Medicine | Admitting: Family Medicine

## 2021-09-04 VITALS — BP 116/81 | HR 91 | Temp 98.4°F | Resp 19

## 2021-09-04 DIAGNOSIS — M25572 Pain in left ankle and joints of left foot: Secondary | ICD-10-CM | POA: Diagnosis not present

## 2021-09-04 DIAGNOSIS — S93492A Sprain of other ligament of left ankle, initial encounter: Secondary | ICD-10-CM | POA: Diagnosis not present

## 2021-09-04 NOTE — ED Triage Notes (Signed)
Pt states her ankle is swollen.

## 2021-09-04 NOTE — ED Provider Notes (Signed)
[name]    CSN: [id number] Arrival date & time: 09/04/21  1354      History   Chief Complaint Chief Complaint  Patient presents with   Ankle Injury    HPI [name] is a 30 y.o. female.   HPI  Patient states she twisted her ankle this morning.  She states she felt a pop within severe pain.  She is unable to fully weight-bear on the ankle.  It is quite swollen.  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   Calf pain    Nausea with vomiting 07/08/2016   Chest pain 07/07/2016   Numbness on right side 07/07/2016   Depression    PTSD (post-traumatic stress disorder)    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    Tobacco abuse    Atypical chest pain     Past Surgical History:  Procedure Laterality Date   CARDIAC SURGERY     CHOLECYSTECTOMY     TEE WITHOUT CARDIOVERSION N/A 07/09/2016   Procedure: TRANSESOPHAGEAL ECHOCARDIOGRAM (TEE);  Surgeon: Orpah Cobb, MD;  Location: [facility] ENDOSCOPY;  Service: Cardiovascular;  Laterality: N/A;   TEE WITHOUT CARDIOVERSION N/A 03/19/2017   Procedure: TRANSESOPHAGEAL ECHOCARDIOGRAM (TEE);  Surgeon: Orpah Cobb, MD;  Location: [facility] ENDOSCOPY;  Service: Cardiovascular;  Laterality: N/A;    OB History   No obstetric history on file.      Home Medications    Prior to Admission medications   Medication Sig Start Date End Date Taking? Authorizing Provider   oxyCODONE-acetaminophen (PERCOCET) 10-325 MG tablet Take 1 tablet by mouth every 8 (eight) hours as needed for pain.   Yes [provider]  acetaminophen (TYLENOL) 325 MG tablet Take 650 mg by mouth every 6 (six) hours as needed for mild pain.    [provider]  apixaban (ELIQUIS) 5 MG TABS tablet Take 1 tablet (5 mg total) by mouth 2 (two) times daily. 03/26/17   Richarda Overlie, MD  apixaban (ELIQUIS) 5 MG TABS tablet Take 2 tablets (10 mg total) by mouth 2 (two) times daily. 03/19/17 03/25/17  Richarda Overlie, MD  ondansetron (ZOFRAN) 8 MG tablet Take 8 mg by mouth every 8 (eight) hours as needed for nausea or vomiting.    [provider]  pantoprazole sodium (PROTONIX) 40 mg/20 mL PACK Take 20 mLs (40 mg total) by mouth daily. 03/18/17   Richarda Overlie, MD  promethazine (PHENERGAN) 25 MG tablet Take 25 mg by mouth every 6 (six) hours as needed for nausea or vomiting.    [provider]  sucralfate (CARAFATE) 1 GM/10ML suspension Take 10 mLs (1 g total) by mouth 4 (four) times daily -  with meals and at bedtime. 03/18/17   Richarda Overlie, MD  Vilazodone HCl 20 MG TABS Take 40 mg by mouth daily.  10/16/16   [provider]  Family History Family History  Problem Relation Age of Onset   Bipolar disorder Mother    Glaucoma Mother    Arthritis Father     Social History Social History   Tobacco Use   Smoking status: Some Days    Types: Cigarettes   Smokeless tobacco: Never  Vaping Use   Vaping Use: Every day  Substance Use Topics   Alcohol use: No   Drug use: No     Allergies   Fluoxetine, Ceftriaxone, Gabapentin, Levaquin [levofloxacin], Morphine and related, Nsaids, Topamax [topiramate], Toradol [ketorolac tromethamine], Tramadol, Vicodin [hydrocodone-acetaminophen], Depakote [divalproex sodium], Methocarbamol, and Metoclopramide   Review of Systems Review of Systems See HPI  Physical Exam Triage Vital Signs ED Triage Vitals  Enc  Vitals Group     BP 09/04/21 1426 116/81     Pulse Rate 09/04/21 1425 91     Resp 09/04/21 1425 19     Temp 09/04/21 1426 98.4 F (36.9 C)     Temp Source 09/04/21 1426 Oral     SpO2 09/04/21 1425 98 %     Weight --      Height --      Head Circumference --      Peak Flow --      Pain Score 09/04/21 1423 7     Pain Loc --      Pain Edu? --      Excl. in GC? --    No data found.  Updated Vital Signs BP 116/81 (BP Location: Right Arm)   Pulse 91   Temp 98.4 F (36.9 C) (Oral)   Resp 19   LMP 08/23/2021 (Exact Date)   SpO2 98%      Physical Exam Constitutional:      General: She is not in acute distress.    Appearance: She is well-developed.  HENT:     Head: Normocephalic and atraumatic.     Mouth/Throat:     Comments: Mask is in place Eyes:     Conjunctiva/sclera: Conjunctivae normal.     Pupils: Pupils are equal, round, and reactive to light.  Cardiovascular:     Rate and Rhythm: Normal rate.  Pulmonary:     Effort: Pulmonary effort is normal. No respiratory distress.  Abdominal:     General: There is no distension.     Palpations: Abdomen is soft.  Musculoskeletal:        General: Normal range of motion.     Cervical back: Normal range of motion.     Comments: There is soft tissue swelling laterally around the left ankle.  Tenderness over the lateral malleolus.  Tenderness over the anterior ankle.  Limited range of motion secondary to pain.  No tenderness over midfoot or metatarsals  Skin:    General: Skin is warm and dry.  Neurological:     Mental Status: She is alert.     UC Treatments / Results  Labs (all labs ordered are listed, but only abnormal results are displayed) Labs Reviewed - No data to display  EKG   Radiology DG Ankle Complete Left  Result Date: 09/04/2021 CLINICAL DATA:  Injured ankle today. EXAM: LEFT ANKLE COMPLETE - 3+ VIEW COMPARISON:  None. FINDINGS: The ankle mortise is maintained. No acute ankle fracture is identified. Mild  degenerative changes, likely remote posttraumatic change. No osteochondral lesion. No definite joint effusion. The visualized mid and hindfoot bony structures are intact. IMPRESSION: No acute bony findings. Electronically Signed   By: Orlene Plum.D.  On: 09/04/2021 15:24    Procedures Procedures (including critical care time)  Medications Ordered in UC Medications - No data to display  Initial Impression / Assessment and Plan / UC Course  I have reviewed the triage vital signs and the nursing notes.  Pertinent labs & imaging results that were available during my care of the patient were reviewed by me and considered in my medical decision making (see chart for details).     X-rays are negative.  Ankle sprain discussed.  Brace and crutches are dispensed.  Follow-up with sports medicine or primary care Final Clinical Impressions(s) / UC Diagnoses   Final diagnoses:  Sprain of anterior talofibular ligament of left ankle, initial encounter     Discharge Instructions      Use ice and elevation to reduce pain and swelling Take tylenol for pain, use your oxycodone as needed Wear brace and use crutches until you can walk comfortably Follow up with your PCP is not seeing improvement by next week      ED Prescriptions   None    I have reviewed the PDMP during this encounter.   Eustace Moore, MD 09/04/21 1725

## 2021-09-04 NOTE — Discharge Instructions (Addendum)
Use ice and elevation to reduce pain and swelling Take tylenol for pain, use your oxycodone as needed Wear brace and use crutches until you can walk comfortably Follow up with your PCP is not seeing improvement by next week

## 2021-09-04 NOTE — ED Triage Notes (Signed)
Pt presents with a left ankle injury. Pt states it happened this morning. States she heard a "snap".  Pt states she took Oxycodone around 1000. States it is prescribed to her.

## 2021-09-14 ENCOUNTER — Ambulatory Visit: Payer: Self-pay | Admitting: Medical-Surgical

## 2021-09-20 ENCOUNTER — Ambulatory Visit: Payer: Medicaid Other | Admitting: Family Medicine

## 2021-09-20 NOTE — Progress Notes (Deleted)
  [name] - 30 y.o. female MRN [id number]  Date of birth: 1991/08/11  SUBJECTIVE:  Including CC & ROS.  No chief complaint on file.   [name] is a 30 y.o. female that is  ***.  ***   Review of Systems See HPI   HISTORY: Past Medical, Surgical, Social, and Family History Reviewed & Updated per EMR.   Pertinent Historical Findings include:  Past Medical History:  Diagnosis Date   Asthma    Constipation    COVID 07/2020   Depression    PTSD (post-traumatic stress disorder)    Thrombosis of right atrium    Tobacco abuse    UTI (urinary tract infection)     Past Surgical History:  Procedure Laterality Date   CARDIAC SURGERY     CHOLECYSTECTOMY     TEE WITHOUT CARDIOVERSION N/A 07/09/2016   Procedure: TRANSESOPHAGEAL ECHOCARDIOGRAM (TEE);  Surgeon: Orpah Cobb, MD;  Location: [facility] ENDOSCOPY;  Service: Cardiovascular;  Laterality: N/A;   TEE WITHOUT CARDIOVERSION N/A 03/19/2017   Procedure: TRANSESOPHAGEAL ECHOCARDIOGRAM (TEE);  Surgeon: Orpah Cobb, MD;  Location: [facility] ENDOSCOPY;  Service: Cardiovascular;  Laterality: N/A;    Family History  Problem Relation Age of Onset   Bipolar disorder Mother    Glaucoma Mother    Arthritis Father     Social History   Socioeconomic History   Marital status: Single    Spouse name: Not on file   Number of children: Not on file   Years of education: Not on file   Highest education level: Not on file  Occupational History   Not on file  Tobacco Use   Smoking status: Some Days    Types: Cigarettes   Smokeless tobacco: Never  Vaping Use   Vaping Use: Every day  Substance and Sexual Activity   Alcohol use: No   Drug use: No   Sexual activity: Not on file  Other Topics Concern   Not on file  Social History Narrative   Not on file   Social Determinants of Health   Financial Resource Strain: Not on file  Food Insecurity: Not on file  Transportation Needs: Not on file  Physical Activity: Not on file   Stress: Not on file  Social Connections: Not on file  Intimate Partner Violence: Not on file     PHYSICAL EXAM:  VS: LMP 08/23/2021 (Exact Date)  Physical Exam Gen: NAD, alert, cooperative with exam, well-appearing MSK:  ***      ASSESSMENT & PLAN:   No problem-specific Assessment & Plan notes found for this encounter.

## 2021-09-24 ENCOUNTER — Ambulatory Visit: Payer: Medicaid Other | Admitting: Family Medicine

## 2021-09-24 NOTE — Progress Notes (Deleted)
  [name] - 30 y.o. female MRN [id number]  Date of birth: 1991-09-11  SUBJECTIVE:  Including CC & ROS.  No chief complaint on file.   [name] is a 30 y.o. female that is  ***.  ***   Review of Systems See HPI   HISTORY: Past Medical, Surgical, Social, and Family History Reviewed & Updated per EMR.   Pertinent Historical Findings include:  Past Medical History:  Diagnosis Date   Asthma    Constipation    COVID 07/2020   Depression    PTSD (post-traumatic stress disorder)    Thrombosis of right atrium    Tobacco abuse    UTI (urinary tract infection)     Past Surgical History:  Procedure Laterality Date   CARDIAC SURGERY     CHOLECYSTECTOMY     TEE WITHOUT CARDIOVERSION N/A 07/09/2016   Procedure: TRANSESOPHAGEAL ECHOCARDIOGRAM (TEE);  Surgeon: Orpah Cobb, MD;  Location: [facility] ENDOSCOPY;  Service: Cardiovascular;  Laterality: N/A;   TEE WITHOUT CARDIOVERSION N/A 03/19/2017   Procedure: TRANSESOPHAGEAL ECHOCARDIOGRAM (TEE);  Surgeon: Orpah Cobb, MD;  Location: [facility] ENDOSCOPY;  Service: Cardiovascular;  Laterality: N/A;    Family History  Problem Relation Age of Onset   Bipolar disorder Mother    Glaucoma Mother    Arthritis Father     Social History   Socioeconomic History   Marital status: Single    Spouse name: Not on file   Number of children: Not on file   Years of education: Not on file   Highest education level: Not on file  Occupational History   Not on file  Tobacco Use   Smoking status: Some Days    Types: Cigarettes   Smokeless tobacco: Never  Vaping Use   Vaping Use: Every day  Substance and Sexual Activity   Alcohol use: No   Drug use: No   Sexual activity: Not on file  Other Topics Concern   Not on file  Social History Narrative   Not on file   Social Determinants of Health   Financial Resource Strain: Not on file  Food Insecurity: Not on file  Transportation Needs: Not on file  Physical Activity: Not on file   Stress: Not on file  Social Connections: Not on file  Intimate Partner Violence: Not on file     PHYSICAL EXAM:  VS: There were no vitals taken for this visit. Physical Exam Gen: NAD, alert, cooperative with exam, well-appearing MSK:  ***      ASSESSMENT & PLAN:   No problem-specific Assessment & Plan notes found for this encounter.

## 2021-09-26 ENCOUNTER — Other Ambulatory Visit: Payer: Self-pay

## 2021-09-26 ENCOUNTER — Emergency Department
Admission: RE | Admit: 2021-09-26 | Discharge: 2021-09-26 | Disposition: A | Discharge status: Home / Self Care | Payer: Medicaid Other | Source: Ambulatory Visit | Attending: Family Medicine | Admitting: Family Medicine

## 2021-09-26 VITALS — BP 127/81 | HR 115 | Temp 101.3°F | Resp 16 | Ht 63.0 in | Wt 224.0 lb

## 2021-09-26 DIAGNOSIS — J069 Acute upper respiratory infection, unspecified: Secondary | ICD-10-CM

## 2021-09-26 MED ORDER — ALBUTEROL SULFATE HFA 108 (90 BASE) MCG/ACT IN AERS: 1.0000 | INHALATION_SPRAY | Freq: Four times a day (QID) | RESPIRATORY_TRACT | 0 refills | Status: DC | PRN

## 2021-09-26 MED ORDER — ACETAMINOPHEN 325 MG PO TABS
650.0000 mg | ORAL_TABLET | Freq: Once | ORAL | Status: AC
  Administered 2021-09-26: 650 mg via ORAL

## 2021-09-26 MED ORDER — PREDNISONE 20 MG PO TABS: 20.0000 mg | ORAL_TABLET | Freq: Two times a day (BID) | ORAL | 0 refills | Status: DC

## 2021-09-26 MED ORDER — BENZONATATE 200 MG PO CAPS: 200.0000 mg | ORAL_CAPSULE | Freq: Two times a day (BID) | ORAL | 0 refills | Status: DC | PRN

## 2021-09-26 NOTE — ED Triage Notes (Signed)
Fever, cough & body aches since last night Oxycodone at 1030 Temp in triage 101.3 Total since 0050 is 1350mg   Can not take NSAIDS Kids are also sick Negative home COVID test this am

## 2021-09-26 NOTE — ED Provider Notes (Signed)
[name]    CSN: [id number] Arrival date & time: 09/26/21  1447      History   Chief Complaint Chief Complaint  Patient presents with   Cough   Fever    HPI [name] is a 30 y.o. female.   HPI  Patient states that he has been sick with fevers.  She states she is had fever cough and body aches just since last night.  Her temperature in triage today is 101.3.  She is unable to take any anti-inflammatory drugs because of history of clotting disorder and Eliquis.  She takes only Tylenol.  She is also on scheduled oxycodone.  She has to be careful how much Tylenol she takes in order not to overdose.   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   Calf pain    Nausea with vomiting 07/08/2016   Chest pain 07/07/2016   Numbness on right side 07/07/2016   Depression    PTSD (post-traumatic stress disorder)    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    Tobacco abuse    Atypical chest pain     Past Surgical History:  Procedure Laterality Date   CARDIAC SURGERY     CHOLECYSTECTOMY     TEE WITHOUT CARDIOVERSION N/A 07/09/2016   Procedure: TRANSESOPHAGEAL ECHOCARDIOGRAM (TEE);  Surgeon: Orpah Cobb, MD;  Location: [facility] ENDOSCOPY;  Service: Cardiovascular;  Laterality: N/A;   TEE WITHOUT CARDIOVERSION N/A 03/19/2017   Procedure: TRANSESOPHAGEAL ECHOCARDIOGRAM (TEE);  Surgeon: Orpah Cobb, MD;  Location: [facility] ENDOSCOPY;  Service: Cardiovascular;   Laterality: N/A;    OB History   No obstetric history on file.      Home Medications    Prior to Admission medications   Medication Sig Start Date End Date Taking? Authorizing Provider  albuterol (VENTOLIN HFA) 108 (90 Base) MCG/ACT inhaler Inhale 1-2 puffs into the lungs every 6 (six) hours as needed for wheezing or shortness of breath. 09/26/21  Yes Eustace Moore, MD  benzonatate (TESSALON) 200 MG capsule Take 1 capsule (200 mg total) by mouth 2 (two) times daily as needed for cough. 09/26/21  Yes Eustace Moore, MD  predniSONE (DELTASONE) 20 MG tablet Take 1 tablet (20 mg total) by mouth 2 (two) times daily with a meal. 09/26/21  Yes Eustace Moore, MD  acetaminophen (TYLENOL) 325 MG tablet Take 650 mg by mouth every 6 (six) hours as needed for mild pain.    [provider]  apixaban (ELIQUIS) 5 MG TABS tablet Take 1 tablet (5 mg total) by mouth 2 (two) times daily. 03/26/17   Richarda Overlie, MD  ondansetron (ZOFRAN) 8 MG tablet Take 8 mg by mouth every 8 (eight) hours as needed for nausea or vomiting.    [provider]  oxyCODONE-acetaminophen (PERCOCET) 10-325 MG tablet Take 1 tablet by mouth every 8 (eight) hours as needed for pain.  [provider]  pantoprazole sodium (PROTONIX) 40 mg/20 mL PACK Take 20 mLs (40 mg total) by mouth daily. 03/18/17   Richarda Overlie, MD  promethazine (PHENERGAN) 25 MG tablet Take 25 mg by mouth every 6 (six) hours as needed for nausea or vomiting.    [provider]  sucralfate (CARAFATE) 1 GM/10ML suspension Take 10 mLs (1 g total) by mouth 4 (four) times daily -  with meals and at bedtime. 03/18/17   Richarda Overlie, MD  Vilazodone HCl 20 MG TABS Take 40 mg by mouth daily.  10/16/16   [provider]    Family History Family History  Problem Relation Age of Onset   Bipolar disorder Mother    Glaucoma Mother    Arthritis Father     Social History Social History   Tobacco Use   Smoking  status: Some Days    Types: Cigarettes   Smokeless tobacco: Never  Vaping Use   Vaping Use: Every day  Substance Use Topics   Alcohol use: No   Drug use: No     Allergies   Fluoxetine, Ceftriaxone, Gabapentin, Levaquin [levofloxacin], Morphine and related, Nsaids, Topamax [topiramate], Toradol [ketorolac tromethamine], Tramadol, Vicodin [hydrocodone-acetaminophen], Depakote [divalproex sodium], Methocarbamol, and Metoclopramide   Review of Systems Review of Systems See HPI  Physical Exam Triage Vital Signs ED Triage Vitals  Enc Vitals Group     BP 09/26/21 1506 127/81     Pulse Rate 09/26/21 1506 (!) 115     Resp 09/26/21 1506 16     Temp 09/26/21 1506 (!) 101.3 F (38.5 C)     Temp Source 09/26/21 1506 Oral     SpO2 09/26/21 1506 97 %     Weight 09/26/21 1508 224 lb (101.6 kg)     Height 09/26/21 1508 5\' 3"  (1.6 m)     Head Circumference --      Peak Flow --      Pain Score 09/26/21 1508 8     Pain Loc --      Pain Edu? --      Excl. in GC? --    No data found.  Updated Vital Signs BP 127/81 (BP Location: Right Arm)   Pulse (!) 115   Temp (!) 101.3 F (38.5 C) (Oral)   Resp 16   Ht 5\' 3"  (1.6 m)   Wt 101.6 kg   LMP 09/24/2021 (Exact Date)   SpO2 97%   BMI 39.68 kg/m     Physical Exam Constitutional:      General: She is not in acute distress.    Appearance: She is well-developed. She is obese.  HENT:     Head: Normocephalic and atraumatic.     Right Ear: Tympanic membrane, ear canal and external ear normal.     Left Ear: Tympanic membrane, ear canal and external ear normal.     Nose: Rhinorrhea present.     Mouth/Throat:     Mouth: Mucous membranes are moist.     Pharynx: Posterior oropharyngeal erythema present.  Eyes:     Conjunctiva/sclera: Conjunctivae normal.     Pupils: Pupils are equal, round, and reactive to light.  Cardiovascular:     Rate and Rhythm: Regular rhythm. Tachycardia present.  Pulmonary:     Effort: Pulmonary effort is  normal. No respiratory distress.     Breath sounds: Wheezing present. No rales.  Abdominal:     General: There is no distension.     Palpations: Abdomen is soft.  Musculoskeletal:        General: Normal range of motion.     Cervical back: Normal range of motion.  Lymphadenopathy:     Cervical: Cervical adenopathy present.  Skin:    General: Skin is warm and dry.  Neurological:     Mental Status: She is alert.  Psychiatric:        Mood and Affect: Mood normal.        Behavior: Behavior normal.     UC Treatments / Results  Labs (all labs ordered are listed, but only abnormal results are displayed) Labs Reviewed  COVID-19, FLU A+B AND RSV    EKG   Radiology No results found.  Procedures Procedures (including critical care time)  Medications Ordered in UC Medications  acetaminophen (TYLENOL) tablet 650 mg (650 mg Oral Given 09/26/21 1518)    Initial Impression / Assessment and Plan / UC Course  I have reviewed the triage vital signs and the nursing notes.  Pertinent labs & imaging results that were available during my care of the patient were reviewed by me and considered in my medical decision making (see chart for details).     Likely viral illness.  Reviewed.  We will do testing.  Home until results are available. Final Clinical Impressions(s) / UC Diagnoses   Final diagnoses:  Viral URI with cough     Discharge Instructions      Drink lots of fluids Use a humidifier if you have 1 Take Tessalon 2-3 times a day as needed for cough Use albuterol every 4-6 hours as needed for shortness of breath, wheezing, bad coughing spells Take the prednisone 2 times a day for 5 days Check your test results on MyChart   ED Prescriptions     Medication Sig Dispense Auth. Provider   benzonatate (TESSALON) 200 MG capsule Take 1 capsule (200 mg total) by mouth 2 (two) times daily as needed for cough. 20 capsule Eustace Moore, MD   albuterol (VENTOLIN HFA) 108 (90  Base) MCG/ACT inhaler Inhale 1-2 puffs into the lungs every 6 (six) hours as needed for wheezing or shortness of breath. 18 g Eustace Moore, MD   predniSONE (DELTASONE) 20 MG tablet Take 1 tablet (20 mg total) by mouth 2 (two) times daily with a meal. 10 tablet Eustace Moore, MD      I have reviewed the PDMP during this encounter.   Eustace Moore, MD 09/26/21 [phone]

## 2021-09-26 NOTE — Discharge Instructions (Signed)
Drink lots of fluids Use a humidifier if you have 1 Take Tessalon 2-3 times a day as needed for cough Use albuterol every 4-6 hours as needed for shortness of breath, wheezing, bad coughing spells Take the prednisone 2 times a day for 5 days Check your test results on MyChart

## 2021-09-28 LAB — COVID-19, FLU A+B AND RSV
Influenza A, NAA: NOT DETECTED
Influenza B, NAA: NOT DETECTED
RSV, NAA: NOT DETECTED
SARS-CoV-2, NAA: NOT DETECTED

## 2021-10-01 ENCOUNTER — Encounter: Payer: Medicaid Other | Admitting: Obstetrics and Gynecology

## 2021-10-08 ENCOUNTER — Encounter: Payer: Medicaid Other | Admitting: Obstetrics & Gynecology

## 2021-11-08 ENCOUNTER — Encounter: Payer: Self-pay | Admitting: Obstetrics and Gynecology

## 2021-11-08 ENCOUNTER — Other Ambulatory Visit: Payer: Self-pay

## 2021-11-08 ENCOUNTER — Ambulatory Visit: Payer: Medicaid Other | Admitting: Obstetrics and Gynecology

## 2021-11-08 VITALS — BP 111/67 | HR 83 | Resp 16 | Ht 63.0 in | Wt 226.0 lb

## 2021-11-08 DIAGNOSIS — N920 Excessive and frequent menstruation with regular cycle: Secondary | ICD-10-CM

## 2021-11-08 DIAGNOSIS — R102 Pelvic and perineal pain: Secondary | ICD-10-CM

## 2021-11-08 DIAGNOSIS — G8929 Other chronic pain: Secondary | ICD-10-CM | POA: Diagnosis not present

## 2021-11-08 DIAGNOSIS — G459 Transient cerebral ischemic attack, unspecified: Secondary | ICD-10-CM | POA: Diagnosis not present

## 2021-11-08 NOTE — Progress Notes (Signed)
GYNECOLOGY OFFICE VISIT NOTE  History:   [name] is a 30 y.o. G2P2 here today for consultation regarding options for her bleeding/pain. She notes she has long standing pelvic pain but that it significantly worsened 2 years ago at which point she started seeing a pain specialist. About 6 months ago it was no longer able to be managed with other non-narcotic options. She would like to come off these medications. Her pain is primarily around her cycles, but she also has pain with intercourse and random pains when not during her cycle.   She cannot do many hormonal options nor does she wish to. She did an Lng-IUD in the past and did not tolerate it and ultimately had it removed.   She was interested in an ablation.  She has had a tubal ligation.   She had seen another gynecologist, Dr. Mayford [name], with [facility] who also recommended a hysterectomy. She saw her hematologist and he did not want her to have surgery and suggested the ablation. She is on blood thinners due to significant clotting risk - she had emergency open heart surgery for a blood clot in her right atrium. She also has had TIAs.   She has had other indicated surgery since being on blood thinners (gall bladder). She was placed on a taper.   She has had two vaginal deliveries tested to 6lb11oz. She has no history of fibroids.   She denies any abnormal vaginal discharge.      Past Medical History:  Diagnosis Date   Asthma    Constipation    COVID 07/2020   Depression    PTSD (post-traumatic stress disorder)    Thrombosis of right atrium    Tobacco abuse    UTI (urinary tract infection)     Past Surgical History:  Procedure Laterality Date   btl     CARDIAC SURGERY     CHOLECYSTECTOMY     TEE WITHOUT CARDIOVERSION N/A 07/09/2016   Procedure: TRANSESOPHAGEAL ECHOCARDIOGRAM (TEE);  Surgeon: Barbara Cobb, MD;  Location: [facility] ENDOSCOPY;  Service: Cardiovascular;  Laterality: N/A;   TEE WITHOUT CARDIOVERSION N/A  03/19/2017   Procedure: TRANSESOPHAGEAL ECHOCARDIOGRAM (TEE);  Surgeon: Barbara Cobb, MD;  Location: [facility] ENDOSCOPY;  Service: Cardiovascular;  Laterality: N/A;    The following portions of the patient's history were reviewed and updated as appropriate: allergies, current medications, past family history, past medical history, past social history, past surgical history and problem list.   Health Maintenance:   Normal pap on 07/2019 (CE)  Review of Systems:  Pertinent items noted in HPI and remainder of comprehensive ROS otherwise negative.  Physical Exam:  BP 111/67   Pulse 83   Resp 16   Ht 5\' 3"  (1.6 m)   Wt 226 lb (102.5 kg)   LMP 09/22/2021   BMI 40.03 kg/m  CONSTITUTIONAL: Well-developed, well-nourished female in no acute distress.  HEENT:  Normocephalic, atraumatic. External right and left ear normal. No scleral icterus.  NECK: Normal range of motion, supple, no masses noted on observation SKIN: No rash noted. Not diaphoretic. No erythema. No pallor. MUSCULOSKELETAL: Normal range of motion. No edema noted. NEUROLOGIC: Alert and oriented to person, place, and time. Normal muscle tone coordination. No cranial nerve deficit noted. PSYCHIATRIC: Normal mood and affect. Normal behavior. Normal judgment and thought content.  CARDIOVASCULAR: Normal heart rate noted RESPIRATORY: Effort and breath sounds normal, no problems with respiration noted ABDOMEN: No masses noted. No other overt distention noted.    PELVIC: Deferred  Labs and Imaging No results found for this or any previous visit (from the past 168 hour(s)). No results found.   [place] found in Careeverywhere and report reviewed - normal pelvic US in 2019.  She also had a normal CT from a gyne perspective in 2019 Assessment and Plan:  [name] was seen today for discuss ablation.  TIA (transient ischemic attack) -     Ambulatory referral to Hematology / Oncology  Menorrhagia with regular cycle - Discussed while an ablation  would work for this she would be at risk for post-ablation tubal syndrome given she would have significant amounts of time before menopause to start bleeding again. At this point the pain is commonly acute and requires emergency surgery - We discussed that also while it may work temporarily, she would be at high risk for bleeding again.  - We also discussed that an ablation would not help her pain. She would not like to do an ablation following our discussion. We discussed other options I.e. LnG-IUD and other progesterone only options. She does not want these measures and would like to have definitive management since she will be on blood thinners long term. She would prefer to have surgery sometime in the spring, before April.  - We will get her set up with Hem/onc asap and then also coordinate a blood thinner plan for prior to and following surgery.  - We discussed the surgery and what the procedure entails.  - Planned surgery: TVH, bilateral salpingectomy - Risks of surgery include but are not limited to: bleeding, infection, injury to surrounding organs/tissues (i.e. bowel/bladder/ureters), need for additional procedures, wound complications, hospital re-admission, and conversion to open surgery or laparoscopic surgery - We discussed postop restrictions, precautions and expectations - Preop will need to coordinate with hem/onc as noted above - All questions answered, copy of consent given along with preop information packet   Chronic pelvic pain in female  Routine preventative health maintenance measures emphasized. Please refer to After Visit Summary for other counseling recommendations.   No follow-ups on file.  Barbara Hock, MD, FACOG Obstetrician & Gynecologist, [facility] for [facility], [facility]

## 2021-11-19 ENCOUNTER — Telehealth: Payer: Self-pay | Admitting: *Deleted

## 2021-11-19 NOTE — Telephone Encounter (Signed)
Patient advised that she may not have an option for surgery to be scheduled at [facility] instead of [facility]. Patient given contact information to surgery scheduler.

## 2021-11-22 ENCOUNTER — Inpatient Hospital Stay: Payer: Medicaid Other

## 2021-11-22 ENCOUNTER — Inpatient Hospital Stay: Payer: Medicaid Other | Attending: Hematology & Oncology | Admitting: Hematology & Oncology

## 2021-11-22 ENCOUNTER — Other Ambulatory Visit: Payer: Self-pay

## 2021-11-22 ENCOUNTER — Encounter: Payer: Self-pay | Admitting: Hematology & Oncology

## 2021-11-22 VITALS — BP 118/68 | HR 64 | Temp 98.3°F | Resp 16 | Wt 230.0 lb

## 2021-11-22 DIAGNOSIS — D689 Coagulation defect, unspecified: Secondary | ICD-10-CM

## 2021-11-22 DIAGNOSIS — I82220 Acute embolism and thrombosis of inferior vena cava: Secondary | ICD-10-CM | POA: Insufficient documentation

## 2021-11-22 DIAGNOSIS — Z8616 Personal history of COVID-19: Secondary | ICD-10-CM | POA: Insufficient documentation

## 2021-11-22 DIAGNOSIS — F17210 Nicotine dependence, cigarettes, uncomplicated: Secondary | ICD-10-CM | POA: Insufficient documentation

## 2021-11-22 DIAGNOSIS — I513 Intracardiac thrombosis, not elsewhere classified: Secondary | ICD-10-CM | POA: Insufficient documentation

## 2021-11-22 DIAGNOSIS — Z7901 Long term (current) use of anticoagulants: Secondary | ICD-10-CM | POA: Diagnosis not present

## 2021-11-22 LAB — CMP (CANCER CENTER ONLY)
ALT: 6 U/L (ref 0–44)
AST: 13 U/L — ABNORMAL LOW (ref 15–41)
Albumin: 3.9 g/dL (ref 3.5–5.0)
Alkaline Phosphatase: 44 U/L (ref 38–126)
Anion gap: 7 (ref 5–15)
BUN: 7 mg/dL (ref 6–20)
CO2: 29 mmol/L (ref 22–32)
Calcium: 9.2 mg/dL (ref 8.9–10.3)
Chloride: 104 mmol/L (ref 98–111)
Creatinine: 0.71 mg/dL (ref 0.44–1.00)
GFR, Estimated: >60 mL/min (ref >60)
Glucose, Bld: 85 mg/dL (ref 70–99)
Potassium: 4.3 mmol/L (ref 3.5–5.1)
Sodium: 140 mmol/L (ref 135–145)
Total Bilirubin: 0.4 mg/dL (ref 0.3–1.2)
Total Protein: 7.1 g/dL (ref 6.5–8.1)

## 2021-11-22 LAB — CBC WITH DIFFERENTIAL (CANCER CENTER ONLY)
Abs Immature Granulocytes: 0.02 10*3/uL (ref 0.00–0.07)
Basophils Absolute: 0 10*3/uL (ref 0.0–0.1)
Basophils Relative: 1 %
Eosinophils Absolute: 0.1 10*3/uL (ref 0.0–0.5)
Eosinophils Relative: 2 %
HCT: 41.5 % (ref 36.0–46.0)
Hemoglobin: 13.7 g/dL (ref 12.0–15.0)
Immature Granulocytes: 0 %
Lymphocytes Relative: 37 %
Lymphs Abs: 2.1 10*3/uL (ref 0.7–4.0)
MCH: 29 pg (ref 26.0–34.0)
MCHC: 33 g/dL (ref 30.0–36.0)
MCV: 87.9 fL (ref 80.0–100.0)
Monocytes Absolute: 0.5 10*3/uL (ref 0.1–1.0)
Monocytes Relative: 9 %
Neutro Abs: 2.9 10*3/uL (ref 1.7–7.7)
Neutrophils Relative %: 51 %
Platelet Count: 340 10*3/uL (ref 150–400)
RBC: 4.72 MIL/uL (ref 3.87–5.11)
RDW: 12.4 % (ref 11.5–15.5)
WBC Count: 5.7 10*3/uL (ref 4.0–10.5)
nRBC: 0 % (ref 0.0–0.2)

## 2021-11-22 NOTE — Progress Notes (Signed)
Referral MD  Reason for Referral: Right atrial thrombus/IVC thrombus --idiopathic  Chief Complaint  Patient presents with   New Patient (Initial Visit)  : I changed all my doctors over to the [facility]  HPI: [name] is a very charming 30 year old Hispanic female.  She comes in with her daughter.  Her daughter is very cute.  [name] has a history that goes back to 2012.  She had a blood clot in her left leg.  At that time, she was on oral contraceptives and was smoking.  She was placed on Coumadin for 6 months.  She had heavy cycles.  She subsequently had her second pregnancy in 2014 and delivered a healthy baby girl in September.  Unfortunately, in December, she I think has some shortness of breath.  She has some episodes of syncope.  She was subsequently found to have a thrombus in the right atrium.  She had an IVC thrombus.  She was subsequently  seen by cardiothoracic surgery.  They did a thrombectomy.  She has been studied for hypercoagulable conditions.  So far, everything has been unremarkable.  Pressures been on anticoagulation.  She is on Eliquis.  She was followed in the [place] system.  She last saw Dr. Neil Crouch in early November.  She now is switching all of her care over to the [facility].  Problem is that she is going to have a hysterectomy in March.  She is having a lot of pelvic pain.  She is having heavy cycles.  Her gynecologist-Dr. Nira Conn wish for her to be seen to so we can help manage her anticoagulation.  She is still smoking.  She says she smokes maybe 1 cigarette a day.  She is not diabetic.  She is somewhat active.  There is no family history of blood clots.  Currently, I would have to say that her performance status is ECOG 0.  Past Medical History:  Diagnosis Date   Asthma    Constipation    COVID 07/2020   Depression    PTSD (post-traumatic stress disorder)    Thrombosis of right atrium    Tobacco abuse    UTI (urinary  tract infection)   :   Past Surgical History:  Procedure Laterality Date   btl     CARDIAC SURGERY     CHOLECYSTECTOMY     TEE WITHOUT CARDIOVERSION N/A 07/09/2016   Procedure: TRANSESOPHAGEAL ECHOCARDIOGRAM (TEE);  Surgeon: Orpah Cobb, MD;  Location: [facility] ENDOSCOPY;  Service: Cardiovascular;  Laterality: N/A;   TEE WITHOUT CARDIOVERSION N/A 03/19/2017   Procedure: TRANSESOPHAGEAL ECHOCARDIOGRAM (TEE);  Surgeon: Orpah Cobb, MD;  Location: [facility] ENDOSCOPY;  Service: Cardiovascular;  Laterality: N/A;  :   Current Outpatient Medications:    apixaban (ELIQUIS) 5 MG TABS tablet, Take 1 tablet (5 mg total) by mouth 2 (two) times daily., Disp: 60 tablet, Rfl: 1   oxyCODONE-acetaminophen (PERCOCET) 10-325 MG tablet, Take 1 tablet by mouth every 8 (eight) hours as needed for pain., Disp: , Rfl: :  :   Allergies  Allergen Reactions   Fluoxetine Other (See Comments)    Pt told not to take with eliquis   Tramadol Rash    Flushed  Patient states she is not allergic to medication   Ceftriaxone Itching and Other (See Comments)    Red man syndrome   Gabapentin Other (See Comments)    Confusion/ chest pains   Levaquin [Levofloxacin] Other (See Comments)    Severe abdominal pain  Morphine And Related Itching and Nausea And Vomiting   Nsaids Other (See Comments)    Pt told by hematologist not to take   Topamax [Topiramate] Other (See Comments)    Dizziness, black out spells   Toradol [Ketorolac Tromethamine] Itching and Other (See Comments)    Felt hot   Vicodin [Hydrocodone-Acetaminophen] Other (See Comments)    hallucinations   Depakote [Divalproex Sodium] Rash and Other (See Comments)    Skin felt like it was on fire   Methocarbamol Rash   Metoclopramide Rash and Hives  :   Family History  Problem Relation Age of Onset   Bipolar disorder Mother    Glaucoma Mother    Arthritis Father   :   Social History   Socioeconomic History   Marital status: Single    Spouse name:  Not on file   Number of children: Not on file   Years of education: Not on file   Highest education level: Not on file  Occupational History   Not on file  Tobacco Use   Smoking status: Some Days    Types: Cigarettes   Smokeless tobacco: Never  Vaping Use   Vaping Use: Every day  Substance and Sexual Activity   Alcohol use: No   Drug use: No   Sexual activity: Yes    Birth control/protection: Surgical  Other Topics Concern   Not on file  Social History Narrative   Not on file   Social Determinants of Health   Financial Resource Strain: Not on file  Food Insecurity: Not on file  Transportation Needs: Not on file  Physical Activity: Not on file  Stress: Not on file  Social Connections: Not on file  Intimate Partner Violence: Not on file  :  Review of Systems  Constitutional: Negative.   HENT: Negative.    Eyes: Negative.   Respiratory: Negative.    Cardiovascular: Negative.   Gastrointestinal: Negative.   Genitourinary: Negative.   Musculoskeletal: Negative.   Skin: Negative.   Neurological: Negative.   Endo/Heme/Allergies: Negative.   Psychiatric/Behavioral: Negative.       Exam: @IPVITALS @ Physical Exam Vitals reviewed.  HENT:     Head: Normocephalic and atraumatic.  Eyes:     Pupils: Pupils are equal, round, and reactive to light.  Cardiovascular:     Rate and Rhythm: Normal rate and regular rhythm.     Heart sounds: Normal heart sounds.  Pulmonary:     Effort: Pulmonary effort is normal.     Breath sounds: Normal breath sounds.  Abdominal:     General: Bowel sounds are normal.     Palpations: Abdomen is soft.  Musculoskeletal:        General: No tenderness or deformity. Normal range of motion.     Cervical back: Normal range of motion.  Lymphadenopathy:     Cervical: No cervical adenopathy.  Skin:    General: Skin is warm and dry.     Findings: No erythema or rash.  Neurological:     Mental Status: She is alert and oriented to person,  place, and time.  Psychiatric:        Behavior: Behavior normal.        Thought Content: Thought content normal.        Judgment: Judgment normal.    Recent Labs    11/22/21 1439  WBC 5.7  HGB 13.7  HCT 41.5  PLT 340    Recent Labs    11/22/21 1439  NA 140  K 4.3  CL 104  CO2 29  GLUCOSE 85  BUN 7  CREATININE 0.71  CALCIUM 9.2    Blood smear review: None  Pathology: None    Assessment and Plan: [name] is a very charming 30 year old Hispanic female.  She does have a very interesting history with this IVC thrombus and atrial thrombus.  Obviously, these were quite significant and she needed to have these surgically removed.  She is on lifelong anticoagulation.  As far as her hysterectomy is concerned, I really do not think this should be much of a problem.  I think she could probably just be off the Eliquis for couple days.  I told her this would be much for problem for her.  I would suspect this is going be done laparoscopically.  I would then plan to get her back on the day after her surgery.  She also tells me that she is going to have a tooth removed.  She will have the tooth extracted she thinks it may.  She wants to know what to do with the Eliquis at that time.  She is having heavy cycles.  I want to make sure that her blood counts can be okay.  We are checking a CBC and iron studies.  I would like to see her back before her surgery.  I think her surgery is going be in early March.  I will see her back probably in Late February.

## 2021-11-23 LAB — IRON AND TIBC
Iron: 89 ug/dL (ref 41–142)
Saturation Ratios: 24 % (ref 21–57)
TIBC: 369 ug/dL (ref 236–444)
UIBC: 280 ug/dL (ref 120–384)

## 2021-11-23 LAB — FERRITIN: Ferritin: 27 ng/mL (ref 11–307)

## 2021-11-29 ENCOUNTER — Other Ambulatory Visit: Payer: Self-pay

## 2021-11-29 ENCOUNTER — Ambulatory Visit: Payer: Self-pay | Admitting: Medical-Surgical

## 2021-11-29 ENCOUNTER — Emergency Department (INDEPENDENT_AMBULATORY_CARE_PROVIDER_SITE_OTHER)
Admission: EM | Admit: 2021-11-29 | Discharge: 2021-11-29 | Disposition: A | Discharge status: Home / Self Care | Payer: Medicaid Other | Source: Home / Self Care

## 2021-11-29 DIAGNOSIS — U071 COVID-19: Secondary | ICD-10-CM

## 2021-11-29 DIAGNOSIS — Z91199 Patient's noncompliance with other medical treatment and regimen due to unspecified reason: Secondary | ICD-10-CM

## 2021-11-29 LAB — POC SARS CORONAVIRUS 2 AG -  ED: SARS Coronavirus 2 Ag: POSITIVE — AB

## 2021-11-29 NOTE — ED Triage Notes (Addendum)
Pt c/o cough, sore throat and headache since Sunday. Possible exposure at work. Tested pos for covid this am. Wants office confirmed test. Not vaccinated. No OTC meds tried.

## 2021-11-29 NOTE — Discharge Instructions (Addendum)
Advised patient may alternate between Tylenol 1000 mg 1-2 times daily, as needed with ibuprofen 600 mg 1-2 times daily, as needed for fever and myalgias.  Encouraged patient to increase daily water intake while taking these medications.  Advised patient to self quarantine for 10 days or until 12/09/2022.  Advised patient if asymptomatic and afebrile may self quarantine for 5 days.

## 2021-11-29 NOTE — ED Provider Notes (Signed)
[name]    CSN: [id number] Arrival date & time: 11/29/21  0940      History   Chief Complaint Chief Complaint  Patient presents with   Covid Positive   Sore Throat   Cough   Headache    HPI [name] is a 30 y.o. female.   HPI 1 female 30 year old female presents with cough, sore throat, headache since Sunday.  Reports positive COVID-19 test this morning.  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   Calf pain    Nausea with vomiting 07/08/2016   Chest pain 07/07/2016   Numbness on right side 07/07/2016   Depression    PTSD (post-traumatic stress disorder)    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    Tobacco abuse    Atypical chest pain     Past Surgical History:  Procedure Laterality Date   btl     CARDIAC SURGERY     CHOLECYSTECTOMY     TEE WITHOUT CARDIOVERSION N/A 07/09/2016   Procedure: TRANSESOPHAGEAL ECHOCARDIOGRAM (TEE);  Surgeon: Orpah Cobb, MD;  Location: [facility] ENDOSCOPY;  Service: Cardiovascular;  Laterality: N/A;   TEE WITHOUT CARDIOVERSION N/A 03/19/2017   Procedure: TRANSESOPHAGEAL ECHOCARDIOGRAM (TEE);  Surgeon: Orpah Cobb, MD;  Location: [facility] ENDOSCOPY;  Service: Cardiovascular;  Laterality: N/A;    OB History     Gravida  2   Para  2   Term      Preterm      AB      Living  2      SAB      IAB      Ectopic      Multiple      Live Births  2            Home Medications     Prior to Admission medications   Medication Sig Start Date End Date Taking? Authorizing Provider  apixaban (ELIQUIS) 5 MG TABS tablet Take 1 tablet (5 mg total) by mouth 2 (two) times daily. 03/26/17   Richarda Overlie, MD  oxyCODONE-acetaminophen (PERCOCET) 10-325 MG tablet Take 1 tablet by mouth every 8 (eight) hours as needed for pain.    [provider]    Family History Family History  Problem Relation Age of Onset   Bipolar disorder Mother    Glaucoma Mother    Arthritis Father     Social History Social History   Tobacco Use   Smoking status: Some Days    Types: Cigarettes   Smokeless tobacco: Never  Vaping Use   Vaping Use: Every day  Substance Use Topics   Alcohol use: No   Drug use: No     Allergies   Fluoxetine, Tramadol, Ceftriaxone, Gabapentin, Levaquin [levofloxacin], Morphine and related, Nsaids, Topamax [topiramate], Toradol [ketorolac tromethamine], Vicodin [hydrocodone-acetaminophen], Depakote [divalproex sodium], Methocarbamol, and Metoclopramide   Review of Systems Review of Systems  HENT:  Positive for sore throat.   Respiratory:  Positive for cough.   Neurological:  Positive for headaches.  All other systems reviewed and are negative.   Physical Exam Triage Vital Signs ED Triage Vitals [11/29/21 0953]  Enc Vitals Group     BP 123/74     Pulse Rate 87     Resp 18     Temp 98.8 F (37.1 C)     Temp Source Oral     SpO2 99 %     Weight      Height      Head Circumference      Peak Flow      Pain Score 0     Pain Loc      Pain Edu?      Excl. in GC?    No data found.  Updated Vital Signs BP 123/74 (BP Location: Right Arm)    Pulse 87    Temp 98.8 F (37.1 C) (Oral)    Resp 18    SpO2 99%       Physical Exam Vitals and nursing note reviewed.  Constitutional:      General: She is not in acute distress.    Appearance: She is obese. She is not ill-appearing.  HENT:     Head: Normocephalic and atraumatic.     Right Ear:  Tympanic membrane and ear canal normal.     Left Ear: Tympanic membrane and ear canal normal.     Mouth/Throat:     Mouth: Mucous membranes are moist.     Pharynx: Oropharynx is clear. Uvula midline.  Eyes:     Conjunctiva/sclera: Conjunctivae normal.     Pupils: Pupils are equal, round, and reactive to light.  Cardiovascular:     Rate and Rhythm: Normal rate and regular rhythm.     Heart sounds: Normal heart sounds.  Pulmonary:     Effort: Pulmonary effort is normal.     Breath sounds: Normal breath sounds. No rhonchi or rales.  Musculoskeletal:     Cervical back: Normal range of motion and neck supple.  Skin:    General: Skin is warm and dry.  Neurological:     General: No focal deficit present.     Mental Status: She is alert and oriented to person, place, and time.     UC Treatments / Results  Labs (all labs ordered are listed, but only abnormal results are displayed) Labs Reviewed  POC SARS CORONAVIRUS 2 AG -  ED - Abnormal; Notable for the following components:      Result Value   SARS Coronavirus 2 Ag Positive (*)    All other components within normal limits    EKG   Radiology No results found.  Procedures Procedures (including critical care time)  Medications Ordered in UC Medications - No data to display  Initial Impression / Assessment and Plan / UC Course  I have reviewed the triage vital signs and the nursing notes.  Pertinent labs & imaging results that were available during my care of the patient were reviewed by me and considered in my medical decision making (see chart for details).     MDM: 1.  COVID-19. Advised patient may alternate between Tylenol 1000 mg 1-2 times daily, as needed with ibuprofen 600 mg 1-2 times daily, as needed for fever and myalgias.  Encouraged patient to increase daily water intake while taking these medications.  Advised patient to self quarantine for 10 days or until 12/09/2022.  Advised patient if asymptomatic and afebrile  may self quarantine for 5 days.  Patient discharged home, hemodynamically stable. Final Clinical Impressions(s) / UC Diagnoses   Final diagnoses:  COVID-19     Discharge Instructions      Advised patient may alternate between Tylenol 1000 mg 1-2 times daily, as needed with ibuprofen 600 mg 1-2 times daily, as needed for fever and myalgias.  Encouraged patient to increase daily water intake while taking these medications.  Advised patient to self quarantine for 10 days or until 12/09/2022.  Advised patient if asymptomatic and afebrile may self quarantine for 5 days.     ED Prescriptions   None    PDMP not reviewed this encounter.   Trevor Iha, FNP 11/29/21 1032

## 2021-11-29 NOTE — Progress Notes (Signed)
   Complete physical exam  Patient: [name]   DOB: 09/28/2001   30 y.o. Female  MRN: [id number]  Subjective:    No chief complaint on file.   [name] is a 30 y.o. female who presents today for a complete physical exam. She reports consuming a {diet types:17450} diet. {types:19826} She generally feels {DESC; WELL/FAIRLY WELL/POORLY:18703}. She reports sleeping {DESC; WELL/FAIRLY WELL/POORLY:18703}. She {does/does not:200015} have additional problems to discuss today.    Most recent fall risk assessment:    06/05/2022   10:42 AM  Fall Risk   Falls in the past year? 0  Number falls in past yr: 0  Injury with Fall? 0  Risk for fall due to : No Fall Risks  Follow up Falls evaluation completed     Most recent depression screenings:    06/05/2022   10:42 AM 04/26/2021   10:46 AM  PHQ 2/9 Scores  PHQ - 2 Score 0 0  PHQ- 9 Score 5     {VISON DENTAL STD PSA (Optional):27386}  {History (Optional):23778}  Patient Care Team: Navon Kotowski, NP as PCP - General (Nurse Practitioner)   Outpatient Medications Prior to Visit  Medication Sig   fluticasone (FLONASE) 50 MCG/ACT nasal spray Place 2 sprays into both nostrils in the morning and at bedtime. After 7 days, reduce to once daily.   norgestimate-ethinyl estradiol (SPRINTEC 28) 0.25-35 MG-MCG tablet Take 1 tablet by mouth daily.   Nystatin POWD Apply liberally to affected area 2 times per day   spironolactone (ALDACTONE) 100 MG tablet Take 1 tablet (100 mg total) by mouth daily.   No facility-administered medications prior to visit.    ROS        Objective:     There were no vitals taken for this visit. {Vitals History (Optional):23777}  Physical Exam   No results found for any visits on 07/11/22. {Show previous labs (optional):23779}    Assessment & Plan:    Routine Health Maintenance and Physical Exam  Immunization History  Administered Date(s) Administered   DTaP 12/12/1999, 02/07/2000,  04/17/2000, 01/01/2001, 07/17/2004   Hepatitis A 05/13/2008, 05/19/2009   Hepatitis B 09/29/1999, 11/06/1999, 04/17/2000   HiB (PRP-OMP) 12/12/1999, 02/07/2000, 04/17/2000, 01/01/2001   IPV 12/12/1999, 02/07/2000, 10/06/2000, 07/17/2004   Influenza,inj,Quad PF,6+ Mos 08/19/2014   Influenza-Unspecified 11/18/2012   MMR 10/06/2001, 07/17/2004   Meningococcal Polysaccharide 05/18/2012   Pneumococcal Conjugate-13 01/01/2001   Pneumococcal-Unspecified 04/17/2000, 07/01/2000   Tdap 05/18/2012   Varicella 10/06/2000, 05/13/2008    Health Maintenance  Topic Date Due   HIV Screening  Never done   Hepatitis C Screening  Never done   INFLUENZA VACCINE  07/09/2022   PAP-Cervical Cytology Screening  07/11/2022 (Originally 09/27/2020)   PAP SMEAR-Modifier  07/11/2022 (Originally 09/27/2020)   TETANUS/TDAP  07/11/2022 (Originally 05/18/2022)   HPV VACCINES  Discontinued   COVID-19 Vaccine  Discontinued    Discussed health benefits of physical activity, and encouraged her to engage in regular exercise appropriate for her age and condition.  Problem List Items Addressed This Visit   None Visit Diagnoses     Annual physical exam    -  Primary   Cervical cancer screening       Need for Tdap vaccination          No follow-ups on file.     Trashaun Streight, NP   

## 2021-12-04 ENCOUNTER — Telehealth: Payer: Self-pay | Admitting: Medical-Surgical

## 2021-12-04 NOTE — Telephone Encounter (Signed)
Dr. Oneita Hurt called and would like to know if you will take her as a new patient she is the daughter of Maxine Glenn and Skai Lickteig. Please advise so we can schedule.   Thank you  Arline Asp

## 2021-12-09 ENCOUNTER — Emergency Department (HOSPITAL_COMMUNITY): Payer: Medicaid Other

## 2021-12-09 ENCOUNTER — Other Ambulatory Visit: Payer: Self-pay

## 2021-12-09 ENCOUNTER — Encounter (HOSPITAL_COMMUNITY): Payer: Self-pay | Admitting: *Deleted

## 2021-12-09 ENCOUNTER — Emergency Department (HOSPITAL_COMMUNITY)
Admission: EM | Admit: 2021-12-09 | Discharge: 2021-12-10 | Disposition: A | Discharge status: Home / Self Care | Payer: Medicaid Other | Attending: Emergency Medicine | Admitting: Emergency Medicine

## 2021-12-09 DIAGNOSIS — Z7901 Long term (current) use of anticoagulants: Secondary | ICD-10-CM | POA: Diagnosis not present

## 2021-12-09 DIAGNOSIS — R55 Syncope and collapse: Secondary | ICD-10-CM | POA: Diagnosis not present

## 2021-12-09 DIAGNOSIS — Z8616 Personal history of COVID-19: Secondary | ICD-10-CM | POA: Insufficient documentation

## 2021-12-09 DIAGNOSIS — M79662 Pain in left lower leg: Secondary | ICD-10-CM | POA: Insufficient documentation

## 2021-12-09 DIAGNOSIS — M549 Dorsalgia, unspecified: Secondary | ICD-10-CM | POA: Insufficient documentation

## 2021-12-09 DIAGNOSIS — R0789 Other chest pain: Secondary | ICD-10-CM | POA: Diagnosis not present

## 2021-12-09 DIAGNOSIS — R072 Precordial pain: Secondary | ICD-10-CM | POA: Insufficient documentation

## 2021-12-09 DIAGNOSIS — R200 Anesthesia of skin: Secondary | ICD-10-CM | POA: Diagnosis not present

## 2021-12-09 DIAGNOSIS — M79661 Pain in right lower leg: Secondary | ICD-10-CM | POA: Insufficient documentation

## 2021-12-09 LAB — BASIC METABOLIC PANEL
Anion gap: 8 (ref 5–15)
BUN: 11 mg/dL (ref 6–20)
CO2: 24 mmol/L (ref 22–32)
Calcium: 8.8 mg/dL — ABNORMAL LOW (ref 8.9–10.3)
Chloride: 104 mmol/L (ref 98–111)
Creatinine, Ser: 0.8 mg/dL (ref 0.44–1.00)
GFR, Estimated: >60 mL/min (ref >60)
Glucose, Bld: 96 mg/dL (ref 70–99)
Potassium: 3.5 mmol/L (ref 3.5–5.1)
Sodium: 136 mmol/L (ref 135–145)

## 2021-12-09 LAB — CBC
HCT: 41.9 % (ref 36.0–46.0)
Hemoglobin: 14.2 g/dL (ref 12.0–15.0)
MCH: 29 pg (ref 26.0–34.0)
MCHC: 33.9 g/dL (ref 30.0–36.0)
MCV: 85.7 fL (ref 80.0–100.0)
Platelets: 433 10*3/uL — ABNORMAL HIGH (ref 150–400)
RBC: 4.89 MIL/uL (ref 3.87–5.11)
RDW: 12.3 % (ref 11.5–15.5)
WBC: 8.1 10*3/uL (ref 4.0–10.5)
nRBC: 0 % (ref 0.0–0.2)

## 2021-12-09 LAB — D-DIMER, QUANTITATIVE: D-Dimer, Quant: <0.27 ug/mL-FEU (ref 0.00–0.50)

## 2021-12-09 LAB — I-STAT BETA HCG BLOOD, ED (MC, WL, AP ONLY): I-stat hCG, quantitative: <5 m[IU]/mL (ref <5)

## 2021-12-09 LAB — TROPONIN I (HIGH SENSITIVITY): Troponin I (High Sensitivity): <2 ng/L (ref <18)

## 2021-12-09 MED ORDER — ONDANSETRON HCL 4 MG PO TABS: 4.0000 mg | ORAL_TABLET | Freq: Four times a day (QID) | ORAL | 0 refills | Status: DC

## 2021-12-09 MED ORDER — OXYCODONE HCL 5 MG PO TABS
10.0000 mg | ORAL_TABLET | Freq: Once | ORAL | Status: DC
Start: 2021-12-09 — End: 2021-12-09
  Filled 2021-12-09: qty 2

## 2021-12-09 MED ORDER — MECLIZINE HCL 25 MG PO TABS
25.0000 mg | ORAL_TABLET | Freq: Once | ORAL | Status: AC
  Administered 2021-12-10: 25 mg via ORAL
  Filled 2021-12-09: qty 1

## 2021-12-09 MED ORDER — SODIUM CHLORIDE 0.9 % IV SOLN
12.5000 mg | Freq: Four times a day (QID) | INTRAVENOUS | Status: DC | PRN
  Administered 2021-12-10: 12.5 mg via INTRAVENOUS
  Filled 2021-12-09: qty 0.5

## 2021-12-09 MED ORDER — SODIUM CHLORIDE 0.9 % IV BOLUS
500.0000 mL | Freq: Once | INTRAVENOUS | Status: AC
  Administered 2021-12-09: 500 mL via INTRAVENOUS

## 2021-12-09 MED ORDER — ONDANSETRON HCL 4 MG/2ML IJ SOLN
4.0000 mg | Freq: Once | INTRAMUSCULAR | Status: AC
  Administered 2021-12-09: 4 mg via INTRAVENOUS
  Filled 2021-12-09: qty 2

## 2021-12-09 MED ORDER — HYDROMORPHONE HCL 1 MG/ML IJ SOLN
0.5000 mg | Freq: Once | INTRAMUSCULAR | Status: AC
  Administered 2021-12-09: 0.5 mg via INTRAVENOUS
  Filled 2021-12-09: qty 1

## 2021-12-09 MED ORDER — HYDROMORPHONE HCL 1 MG/ML IJ SOLN
0.5000 mg | Freq: Once | INTRAMUSCULAR | Status: AC
  Administered 2021-12-10: 0.5 mg via INTRAVENOUS
  Filled 2021-12-09: qty 1

## 2021-12-09 NOTE — Discharge Instructions (Addendum)
Return for any problem.  ?

## 2021-12-09 NOTE — ED Notes (Signed)
ED Provider at bedside. 

## 2021-12-09 NOTE — ED Triage Notes (Addendum)
Pain in bilateral legs x1 week. Pain in the chest 3-4 days, in the right chest, when takes a deep breath can feel it in the right shoulder. On eliquis. Nausea and vomiting x 2 hours.

## 2021-12-09 NOTE — ED Provider Notes (Signed)
[facility]   CSN: [id number] Arrival date & time: 12/09/21  2113     History  Chief Complaint  Patient presents with   Chest Pain    Barbara MelterKrizia Mirta [name] is a 31 y.o. female.  31 year old female with prior medical history as detailed below presents for evaluation.  Patient complains of constant chest pain for the last 3 to 4 days.  Patient reports that the pain is central.  The pain is worse when she takes a deep breath.  She describes a dull ache.  Patient does take oxycodone at home for chronic pain.  She reports that even with the use of this oxycodone she still has persistent discomfort.    She denies fever.  She is on Eliquis.  She has a significant prior history of atrial thrombus.  She has been on Eliquis for years.  She reports full compliance with Eliquis recently.    She also reports that approximately 10 days ago she had a positive COVID test.  Her symptoms were fairly mild.  This was her second reported COVID infection.  She reports mild URI symptoms that were most significant approximately 7 days prior.  The history is provided by the patient.  Chest Pain Pain location:  Substernal area Pain quality: aching and dull   Pain radiates to:  Does not radiate Pain severity:  Mild Onset quality:  Gradual Duration:  3 days     Home Medications Prior to Admission medications   Medication Sig Start Date End Date Taking? Authorizing Provider  apixaban (ELIQUIS) 5 MG TABS tablet Take 1 tablet (5 mg total) by mouth 2 (two) times daily. 03/26/17   Richarda OverlieAbrol, Nayana, MD  oxyCODONE-acetaminophen (PERCOCET) 10-325 MG tablet Take 1 tablet by mouth every 8 (eight) hours as needed for pain.    [provider]      Allergies    Fluoxetine, Tramadol, Ceftriaxone, Gabapentin, Levaquin [levofloxacin], Morphine and related, Nsaids, Topamax [topiramate], Toradol [ketorolac tromethamine], Vicodin [hydrocodone-acetaminophen],  Depakote [divalproex sodium], Methocarbamol, and Metoclopramide    Review of Systems   Review of Systems  Cardiovascular:  Positive for chest pain.  All other systems reviewed and are negative.  Physical Exam Updated Vital Signs BP (!) 135/57 (BP Location: Right Arm)    Pulse 82    Temp 98.5 F (36.9 C) (Oral)    Resp 18    Ht 5\' 3"  (1.6 m)    Wt 102.1 kg    LMP 11/23/2021    SpO2 99%    BMI 39.86 kg/m  Physical Exam Vitals and nursing note reviewed.  Constitutional:      General: She is not in acute distress.    Appearance: Normal appearance. She is well-developed.  HENT:     Head: Normocephalic and atraumatic.  Eyes:     Conjunctiva/sclera: Conjunctivae normal.     Pupils: Pupils are equal, round, and reactive to light.  Cardiovascular:     Rate and Rhythm: Normal rate and regular rhythm.     Heart sounds: Normal heart sounds.  Pulmonary:     Effort: Pulmonary effort is normal. No respiratory distress.     Breath sounds: Normal breath sounds.  Abdominal:     General: There is no distension.     Palpations: Abdomen is soft.     Tenderness: There is no abdominal tenderness.  Musculoskeletal:        General: No deformity. Normal range of motion.     Cervical back:  Normal range of motion and neck supple.  Skin:    General: Skin is warm and dry.  Neurological:     General: No focal deficit present.     Mental Status: She is alert and oriented to person, place, and time.    ED Results / Procedures / Treatments   Labs (all labs ordered are listed, but only abnormal results are displayed) Labs Reviewed  BASIC METABOLIC PANEL - Abnormal; Notable for the following components:      Result Value   Calcium 8.8 (*)    All other components within normal limits  CBC - Abnormal; Notable for the following components:   Platelets 433 (*)    All other components within normal limits  D-DIMER, QUANTITATIVE  I-STAT BETA HCG BLOOD, ED ([facility], [facility], [facility] ONLY)  TROPONIN I (HIGH SENSITIVITY)     EKG EKG Interpretation  Date/Time:  Sunday December 09 2021 21:23:12 EST Ventricular Rate:  84 PR Interval:  148 QRS Duration: 76 QT Interval:  370 QTC Calculation: 437 R Axis:   81 Text Interpretation: Normal sinus rhythm Normal ECG When compared with ECG of 11/01/2020 No significant change since Confirmed by Kristine Royal [id number]) on 12/09/2021 9:33:28 PM  Radiology DG Chest Port 1 View  Result Date: 12/09/2021 CLINICAL DATA:  Chest pain EXAM: PORTABLE CHEST 1 VIEW COMPARISON:  11/01/2020 FINDINGS: Lungs are well expanded, symmetric, and clear. No pneumothorax or pleural effusion. Cardiac size within normal limits. Median sternotomy has been performed. Pulmonary vascularity is normal. Osseous structures are age-appropriate. No acute bone abnormality. IMPRESSION: No active disease. Electronically Signed   By: Helyn Numbers M.D.   On: 12/09/2021 21:42    Procedures Procedures    Medications Ordered in ED Medications  sodium chloride 0.9 % bolus 500 mL (has no administration in time range)  HYDROmorphone (DILAUDID) injection 0.5 mg (has no administration in time range)  ondansetron (ZOFRAN) injection 4 mg (4 mg Intravenous Given 12/09/21 2157)    ED Course/ Medical Decision Making/ A&P                           Medical Decision Making Amount and/or Complexity of Data Reviewed External Data Reviewed: labs, radiology, ECG and notes. Labs: ordered. Decision-making details documented in ED Course. Radiology: independent interpretation performed.    Details: NAD ECG/medicine tests: independent interpretation performed.    Details: No acute ischemic changes.  Risk Parenteral controlled substances. Decision regarding hospitalization.   This patient presents to the ED for concern of chest pain, this involves an extensive number of treatment options, and is a complaint that carries with it a high risk of complications and morbidity.  The differential diagnosis includes ACS, PE,  pneumothorax, pulmonary infection, etc.   Co morbidities that complicate the patient evaluation  Prior atrial thrombus / IVC thrombus, clotting disorder, chronic narcotic use, covid infection   Additional history obtained:  External records from outside source obtained and reviewed including prior ED evaluations, prior outpatient evaluations, and prior inpatient records.   Lab Tests:  I Ordered, and personally interpreted labs.  The pertinent results include: Troponin undetectable, D-dimer less than 0.27, white count 8.1   Imaging Studies ordered:  I ordered imaging studies including chest x-ray I independently visualized and interpreted imaging which showed no acute pathology I agree with the radiologist interpretation   Cardiac Monitoring:  The patient was maintained on a cardiac monitor.  I personally viewed and interpreted the cardiac monitored which  showed an underlying rhythm of: Sinus rhythm   Medicines ordered and prescription drug management:  I ordered medication including pain and nausea medications Reevaluation of the patient after these medicines showed that the patient improved I have reviewed the patients home medicines and have made adjustments as needed   Test Considered:  Patient with history of prior thrombosis.  D-dimer is less than 0.27.  Patient unlikely to benefit from additional imaging such as CT angio.    Problem List / ED Course:  Chest pain, atypical   Reevaluation:  After the interventions noted above, I reevaluated the patient and found that they have :improved   Dispostion:  After consideration of the diagnostic results and the patients response to treatment, I feel that the patent would benefit from close outpatient followup.    Patient presented with atypical chest discomfort.   She has history of prior thrombosis. She reports full compliance with Eliquis.   Ddimer is undetectable. Troponin is undetectable. EKG is without  ischemic change. CXR shows NAD. Other screening labs unremarkable.   At the end of my shift, the patient appears comfortable. She requests additional pain medication and continued ED evaluation/observation. I advised her that I would let the oncoming EDP re-evaluate her. I advised her that all of her ED workup thus far was reassuringly normal.   She does not appear to require hospitalization at this time for additional workup/treatment.  Dr. Pilar Plate aware of pending disposition.           Final Clinical Impression(s) / ED Diagnoses Final diagnoses:  Atypical chest pain    Rx / DC Orders ED Discharge Orders     None         Wynetta Fines, MD 12/11/21 1028

## 2021-12-09 NOTE — ED Notes (Signed)
Pt up to restroom with assistance from tech and w/c. Pt states she would like to be admitted as MD offered

## 2021-12-09 NOTE — ED Notes (Signed)
Pt mild distress in bed, a/ox4. Pt c/o right sided 7/10 CP that increases on inspiration and radiates to right shoulder. Pt has HX blood clots and is on Eliquis. Pt denies SOB. +dizziness, n/v.

## 2021-12-09 NOTE — ED Provider Notes (Signed)
Emergency Medicine Provider Triage Evaluation Note  [name] , a 31 y.o. female  was evaluated in triage.  Pt complains of midsternal chest pain which radiates through to right side of back as well as pain in her legs.  Patient tested positive for COVID 10 days ago, had a very mild sore throat, only obtained a test because she was exposed to her boss who had COVID.  Patient is on Eliquis, has a history of clots, concerned she may have a clot today.  Review of Systems  Positive: Chest pain, leg pain Negative: Fever, shortness of breath  Physical Exam  BP (!) 135/57 (BP Location: Right Arm)    Pulse 82    Temp 98.5 F (36.9 C) (Oral)    Resp 18    Ht 5\' 3"  (1.6 m)    Wt 102.1 kg    LMP 11/23/2021    SpO2 99%    BMI 39.86 kg/m  Gen:   Awake, no distress   Resp:  Normal effort  MSK:   Moves extremities without difficulty  Other:  HR RRR  Medical Decision Making  Medically screening exam initiated at 9:23 PM.  Appropriate orders placed.  11/25/2021 was informed that the remainder of the evaluation will be completed by another provider, this initial triage assessment does not replace that evaluation, and the importance of remaining in the ED until their evaluation is complete.     Barbara Melter, PA-C 12/09/21 2123    2124, MD 12/11/21 1029

## 2021-12-09 NOTE — ED Provider Notes (Signed)
°  Provider Note MRN:  [id number]  Arrival date & time: 12/10/21    ED Course and Medical Decision Making  Assumed care from Dr. Rodena Medin at shift change.  Chest pain, favored noncardiac, history of clotting disorder but D-dimer here is negative.  Troponin is negative.  Providing additional symptomatic management and will reassess.  1:40 AM update: Patient is sitting comfortably, feeling a lot better, normal vital signs, agreeable with discharge and close follow-up with her regular doctors.  Procedures  Final Clinical Impressions(s) / ED Diagnoses     ICD-10-CM   1. Atypical chest pain  R07.89       ED Discharge Orders          Ordered    ondansetron (ZOFRAN) 4 MG tablet  Every 6 hours        12/09/21 2314              Discharge Instructions      Return for any problem.     Elmer Sow. Pilar Plate, MD [facility] Emergency Medicine [facility] [email or web address]    Sabas Sous, MD 12/10/21 [phone]

## 2021-12-09 NOTE — ED Notes (Signed)
Pt states she will wait for nausea meds before taking pain and meclizine

## 2021-12-10 ENCOUNTER — Emergency Department (HOSPITAL_COMMUNITY): Payer: Medicaid Other

## 2021-12-10 ENCOUNTER — Encounter (HOSPITAL_COMMUNITY): Payer: Self-pay | Admitting: Emergency Medicine

## 2021-12-10 ENCOUNTER — Emergency Department (HOSPITAL_COMMUNITY)
Admission: EM | Admit: 2021-12-10 | Discharge: 2021-12-11 | Disposition: A | Discharge status: Home / Self Care | Payer: Medicaid Other | Source: Home / Self Care | Attending: Emergency Medicine | Admitting: Emergency Medicine

## 2021-12-10 ENCOUNTER — Other Ambulatory Visit: Payer: Self-pay

## 2021-12-10 DIAGNOSIS — M79605 Pain in left leg: Secondary | ICD-10-CM

## 2021-12-10 DIAGNOSIS — R200 Anesthesia of skin: Secondary | ICD-10-CM | POA: Insufficient documentation

## 2021-12-10 DIAGNOSIS — R55 Syncope and collapse: Secondary | ICD-10-CM | POA: Insufficient documentation

## 2021-12-10 DIAGNOSIS — M79661 Pain in right lower leg: Secondary | ICD-10-CM | POA: Insufficient documentation

## 2021-12-10 DIAGNOSIS — M79662 Pain in left lower leg: Secondary | ICD-10-CM | POA: Insufficient documentation

## 2021-12-10 DIAGNOSIS — M79604 Pain in right leg: Secondary | ICD-10-CM

## 2021-12-10 DIAGNOSIS — M549 Dorsalgia, unspecified: Secondary | ICD-10-CM | POA: Insufficient documentation

## 2021-12-10 DIAGNOSIS — R079 Chest pain, unspecified: Secondary | ICD-10-CM | POA: Insufficient documentation

## 2021-12-10 DIAGNOSIS — Z7901 Long term (current) use of anticoagulants: Secondary | ICD-10-CM | POA: Insufficient documentation

## 2021-12-10 DIAGNOSIS — R0789 Other chest pain: Secondary | ICD-10-CM | POA: Diagnosis not present

## 2021-12-10 LAB — BASIC METABOLIC PANEL
Anion gap: 7 (ref 5–15)
BUN: 10 mg/dL (ref 6–20)
CO2: 24 mmol/L (ref 22–32)
Calcium: 8.9 mg/dL (ref 8.9–10.3)
Chloride: 105 mmol/L (ref 98–111)
Creatinine, Ser: 0.66 mg/dL (ref 0.44–1.00)
GFR, Estimated: >60 mL/min (ref >60)
Glucose, Bld: 82 mg/dL (ref 70–99)
Potassium: 3.6 mmol/L (ref 3.5–5.1)
Sodium: 136 mmol/L (ref 135–145)

## 2021-12-10 LAB — CBC
HCT: 42 % (ref 36.0–46.0)
Hemoglobin: 14.1 g/dL (ref 12.0–15.0)
MCH: 29 pg (ref 26.0–34.0)
MCHC: 33.6 g/dL (ref 30.0–36.0)
MCV: 86.4 fL (ref 80.0–100.0)
Platelets: 393 10*3/uL (ref 150–400)
RBC: 4.86 MIL/uL (ref 3.87–5.11)
RDW: 12.3 % (ref 11.5–15.5)
WBC: 7 10*3/uL (ref 4.0–10.5)
nRBC: 0 % (ref 0.0–0.2)

## 2021-12-10 LAB — I-STAT BETA HCG BLOOD, ED (MC, WL, AP ONLY): I-stat hCG, quantitative: 11.7 m[IU]/mL — ABNORMAL HIGH (ref <5)

## 2021-12-10 LAB — TROPONIN I (HIGH SENSITIVITY): Troponin I (High Sensitivity): <2 ng/L (ref <18)

## 2021-12-10 NOTE — ED Provider Notes (Signed)
Emergency Medicine Provider Triage Evaluation Note  [name] , a 31 y.o. female  was evaluated in triage.  Pt complains of substernal chest pain has been ongoing for the last 4 days.  Was seen yesterday for similar symptoms.  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  Having left leg pain and numbness starting yesterday.  She states this was similar to the time she had a TIA.  Reports associated shortness of breath.  No vomiting, diaphoresis, diarrhea, abdominal pain.  She did have 2 syncopal episodes at home today.  Review of Systems  Positive:  Negative: See above   Physical Exam  Ht 5\' 3"  (1.6 m)    Wt 110 kg    LMP 11/23/2021    BMI 42.96 kg/m  Gen:   Awake, no distress   Resp:  Normal effort  MSK:   Moves extremities without difficulty  Other:    Medical Decision Making  Medically screening exam initiated at 8:58 PM.  Appropriate orders placed.  11/25/2021 was informed that the remainder of the evaluation will be completed by another provider, this initial triage assessment does not replace that evaluation, and the importance of remaining in the ED until their evaluation is complete.     [name], PA-C 12/10/21 2100    2101, DO 12/10/21 2340

## 2021-12-10 NOTE — ED Notes (Signed)
Pt NAD, a/ox4. Pt verbalizes understanding of all DC and f/u instructions including not driving home after receiving narcotics. Pt verbalizes understanding, stating she will not be driving but will wait in lobby until she can be picked up. All questions answered. Pt walks with steady gait to lobby at DC.

## 2021-12-10 NOTE — ED Notes (Signed)
Pt given ginger ale and crackers per request.  

## 2021-12-10 NOTE — ED Triage Notes (Signed)
Patient reports persistent pain across her chest radiating to upper back and left leg pain/numbness onset yesterday , mild SOB , no emesis or diaphoresis .

## 2021-12-11 ENCOUNTER — Encounter (HOSPITAL_COMMUNITY): Payer: Self-pay | Admitting: Emergency Medicine

## 2021-12-11 ENCOUNTER — Emergency Department (HOSPITAL_BASED_OUTPATIENT_CLINIC_OR_DEPARTMENT_OTHER): Payer: Medicaid Other

## 2021-12-11 ENCOUNTER — Emergency Department (HOSPITAL_COMMUNITY): Payer: Medicaid Other

## 2021-12-11 DIAGNOSIS — M79604 Pain in right leg: Secondary | ICD-10-CM

## 2021-12-11 DIAGNOSIS — M79605 Pain in left leg: Secondary | ICD-10-CM | POA: Diagnosis not present

## 2021-12-11 LAB — HCG, QUANTITATIVE, PREGNANCY: hCG, Beta Chain, Quant, S: <1 m[IU]/mL (ref <5)

## 2021-12-11 LAB — TROPONIN I (HIGH SENSITIVITY): Troponin I (High Sensitivity): 2 ng/L (ref <18)

## 2021-12-11 MED ORDER — ACETAMINOPHEN 325 MG PO TABS
650.0000 mg | ORAL_TABLET | Freq: Once | ORAL | Status: AC
  Administered 2021-12-11: 650 mg via ORAL
  Filled 2021-12-11: qty 2

## 2021-12-11 MED ORDER — OXYCODONE-ACETAMINOPHEN 7.5-325 MG PO TABS: 1.0000 | ORAL_TABLET | Freq: Once | ORAL | Status: DC

## 2021-12-11 MED ORDER — OXYCODONE HCL 5 MG PO TABS
2.5000 mg | ORAL_TABLET | Freq: Once | ORAL | Status: DC
Start: 2021-12-11 — End: 2021-12-11
  Filled 2021-12-11: qty 1

## 2021-12-11 MED ORDER — OXYCODONE HCL 5 MG PO TABS
5.0000 mg | ORAL_TABLET | Freq: Once | ORAL | Status: DC
Start: 2021-12-11 — End: 2021-12-11

## 2021-12-11 MED ORDER — OXYCODONE-ACETAMINOPHEN 5-325 MG PO TABS
1.0000 | ORAL_TABLET | Freq: Once | ORAL | Status: AC
Start: 2021-12-11 — End: 2021-12-11
  Administered 2021-12-11: 1 via ORAL
  Filled 2021-12-11: qty 1

## 2021-12-11 MED ORDER — OXYCODONE HCL 5 MG PO TABS
5.0000 mg | ORAL_TABLET | Freq: Once | ORAL | Status: AC
  Administered 2021-12-11: 5 mg via ORAL
  Filled 2021-12-11: qty 1

## 2021-12-11 NOTE — Progress Notes (Signed)
Patient c/o L sided weakness, highly effort dependent on exam. MRI brain wo contrast pending. If neg, no further neurologic workup indicated as inpatient. Full consult note to follow.  Bing Neighbors, MD [facility] [phone]  If 7pm- 7am, please page neurology on call as listed in [facility].

## 2021-12-11 NOTE — ED Notes (Signed)
Pt reports feeling slightly dizzy

## 2021-12-11 NOTE — ED Provider Notes (Signed)
I was informed that patient is complaining of worsening chest pain. I spoke with patient.  She has had the chest pain ongoing for 4 days, feels it is worse since she has been initially triaged.  Will repeat EKG. She also complains of numbness/weakness in the left leg, it appears that this was a complaint already documented, and this has been ongoing for 4 days now.    She has subjective decrease sensation to light touch circumferentially from mid thigh distal on the left leg.  2+ left DP/PT pulses.  Left leg and foot are warm and well-perfused.  She is unable to straighten her left leg due to weakness which she reports has been ongoing for well over 24 hours at this point  She recently had cardiac work up with negative d-dimer and her troponin has been negative here so far.    Patient returned to waiting room to await treatment room.   Patient does not appear to have any neurologic deficits that are under 24 hours old.    Cristina Gong, [place] 12/11/21 [id number]    Shon Baton, MD 12/11/21 2259

## 2021-12-11 NOTE — ED Notes (Signed)
Bp after ambulating- 109/57

## 2021-12-11 NOTE — Progress Notes (Signed)
Lower extremity venous has been completed.   Preliminary results in CV Proc.   [name] 12/11/2021 9:56 AM

## 2021-12-11 NOTE — ED Notes (Signed)
Pt complaining of new left leg numbness and worsening chest pain. Triage PA made aware. Repeat EKG obtained and pt brought back to triage for reevaluation.

## 2021-12-11 NOTE — ED Provider Notes (Signed)
[facility] EMERGENCY DEPARTMENT Provider Note   CSN: [id number] Arrival date & time: 12/10/21  2014     History  Chief Complaint  Patient presents with   Chest Pain    [name] is a 31 y.o. female.  Patient is a 31 yo female with PMH of unspecified blood clotting disorder, two DVTs, one PE, echo in 2018 demonstrating PFO with right atrial thrombus and reported TIA presenting for chest pain. Patient admits to chest pain in the sternal region with radiation to the back. No associated sob. Admits to bilateral calf pain x 3-5 days and numbness/tingling in bilateral lower extremities-worse on left that occurred during chest pain. States chest pain is worse than when she got to the ED last night.Marland Kitchen approx 12 hours ago. Of note, pt seen in ED 2 days ago for similar symptoms with negative cardiovascular workup and negative d-dimer.   The history is provided by the patient. No language interpreter was used.  Chest Pain Associated symptoms: back pain and numbness (tingling/numbness bilateral LE)   Associated symptoms: no abdominal pain, no cough, no fever, no palpitations, no shortness of breath and no vomiting       Home Medications Prior to Admission medications   Medication Sig Start Date End Date Taking? Authorizing Provider  apixaban (ELIQUIS) 5 MG TABS tablet Take 1 tablet (5 mg total) by mouth 2 (two) times daily. 03/26/17   Reyne Dumas, MD  ondansetron (ZOFRAN) 4 MG tablet Take 1 tablet (4 mg total) by mouth every 6 (six) hours. 12/09/21   Valarie Merino, MD  oxyCODONE-acetaminophen (PERCOCET) 10-325 MG tablet Take 1 tablet by mouth every 8 (eight) hours as needed for pain.    [provider]      Allergies    Fluoxetine, Tramadol, Ceftriaxone, Gabapentin, Levaquin [levofloxacin], Morphine and related, Nsaids, Topamax [topiramate], Toradol [ketorolac tromethamine], Vicodin [hydrocodone-acetaminophen], Depakote [divalproex sodium], Methocarbamol,  and Metoclopramide    Review of Systems   Review of Systems  Constitutional:  Negative for chills and fever.  HENT:  Negative for ear pain and sore throat.   Eyes:  Negative for pain and visual disturbance.  Respiratory:  Negative for cough and shortness of breath.   Cardiovascular:  Positive for chest pain. Negative for palpitations.  Gastrointestinal:  Negative for abdominal pain and vomiting.  Genitourinary:  Negative for dysuria and hematuria.  Musculoskeletal:  Positive for back pain. Negative for arthralgias.  Skin:  Negative for color change and rash.  Neurological:  Positive for numbness (tingling/numbness bilateral LE). Negative for seizures and syncope.  All other systems reviewed and are negative.  Physical Exam Updated Vital Signs BP (!) 123/54 (BP Location: Left Arm)    Pulse 65    Temp 97.9 F (36.6 C)    Resp 16    Ht 5\' 3"  (1.6 m)    Wt 110 kg    LMP 11/23/2021    SpO2 98%    BMI 42.96 kg/m  Physical Exam Vitals and nursing note reviewed.  Constitutional:      General: She is not in acute distress.    Appearance: She is well-developed.  HENT:     Head: Normocephalic and atraumatic.  Eyes:     Conjunctiva/sclera: Conjunctivae normal.  Cardiovascular:     Rate and Rhythm: Normal rate and regular rhythm.     Heart sounds: No murmur heard. Pulmonary:     Effort: Pulmonary effort is normal. No respiratory distress.     Breath  sounds: Normal breath sounds.  Abdominal:     Palpations: Abdomen is soft.     Tenderness: There is no abdominal tenderness.  Musculoskeletal:        General: No swelling.     Cervical back: Neck supple.  Skin:    General: Skin is warm and dry.     Capillary Refill: Capillary refill takes less than 2 seconds.       Neurological:     Mental Status: She is alert and oriented to person, place, and time.     GCS: GCS eye subscore is 4. GCS verbal subscore is 5. GCS motor subscore is 6.     Cranial Nerves: Cranial nerves 2-12 are intact.      Sensory: Sensory deficit present.     Motor: Motor function is intact.     Coordination: Coordination is intact.  Psychiatric:        Mood and Affect: Mood normal.    ED Results / Procedures / Treatments   Labs (all labs ordered are listed, but only abnormal results are displayed) Labs Reviewed  I-STAT BETA HCG BLOOD, ED ([facility], [facility], [facility] ONLY) - Abnormal; Notable for the following components:      Result Value   I-stat hCG, quantitative 11.7 (*)    All other components within normal limits  BASIC METABOLIC PANEL  CBC  HCG, QUANTITATIVE, PREGNANCY  TROPONIN I (HIGH SENSITIVITY)  TROPONIN I (HIGH SENSITIVITY)    EKG EKG Interpretation  Date/Time:  Tuesday December 11 2021 03:58:24 EST Ventricular Rate:  62 PR Interval:  162 QRS Duration: 80 QT Interval:  414 QTC Calculation: 420 R Axis:   69 Text Interpretation: Normal sinus rhythm Normal ECG When compared with ECG of 09-Dec-2021 21:23, PREVIOUS ECG IS PRESENT Confirmed by Campbell Stall ([id number]) on [id number] 8:43:09 AM  Radiology DG Chest 2 View  Result Date: 12/10/2021 CLINICAL DATA:  Chest pain for 4 days. EXAM: CHEST - 2 VIEW COMPARISON:  Radiograph yesterday. FINDINGS: Post median sternotomy.The cardiomediastinal contours are normal. Surgical clips in the anterior mediastinum. Pulmonary vasculature is normal. No consolidation, pleural effusion, or pneumothorax. No acute osseous abnormalities are seen. IMPRESSION: No acute chest findings. Post median sternotomy. Electronically Signed   By: Keith Rake M.D.   On: 12/10/2021 21:57   CT Head Wo Contrast  Result Date: 12/10/2021 CLINICAL DATA:  Left leg numbness EXAM: CT HEAD WITHOUT CONTRAST TECHNIQUE: Contiguous axial images were obtained from the base of the skull through the vertex without intravenous contrast. COMPARISON:  03/16/2017 FINDINGS: Brain: Normal anatomic configuration. No abnormal intra or extra-axial mass lesion or fluid collection. No abnormal mass effect or  midline shift. No evidence of acute intracranial hemorrhage or infarct. Ventricular size is normal. Cerebellum unremarkable. Vascular: Unremarkable Skull: Intact Sinuses/Orbits: Small mucous retention cyst noted within the right maxillary sinus. Remaining paranasal sinuses are clear. Orbits are unremarkable. Other: Mastoid air cells and middle ear cavities are clear. IMPRESSION: No acute intracranial abnormality. Electronically Signed   By: Fidela Salisbury M.D.   On: 12/10/2021 22:41   DG Chest Port 1 View  Result Date: 12/09/2021 CLINICAL DATA:  Chest pain EXAM: PORTABLE CHEST 1 VIEW COMPARISON:  11/01/2020 FINDINGS: Lungs are well expanded, symmetric, and clear. No pneumothorax or pleural effusion. Cardiac size within normal limits. Median sternotomy has been performed. Pulmonary vascularity is normal. Osseous structures are age-appropriate. No acute bone abnormality. IMPRESSION: No active disease. Electronically Signed   By: Fidela Salisbury M.D.   On: 12/09/2021 21:42  Procedures .Critical Care Performed by: Franne Forts, DO Authorized by: Franne Forts, DO   Critical care provider statement:    Critical care time (minutes):  60   Critical care was time spent personally by me on the following activities:  Development of treatment plan with patient or surrogate, discussions with consultants, evaluation of patient's response to treatment, examination of patient, ordering and review of laboratory studies, ordering and review of radiographic studies, ordering and performing treatments and interventions, pulse oximetry, re-evaluation of patient's condition and review of old charts Comments:     Patient has utilized extensive time and resources in the ED today including multiple re-examinations and consults    Medications Ordered in ED Medications  acetaminophen (TYLENOL) tablet 650 mg (has no administration in time range)  oxyCODONE-acetaminophen (PERCOCET) 7.5-325 MG per tablet 1 tablet (has no  administration in time range)    ED Course/ Medical Decision Making/ A&P                           Medical Decision Making  9:01 AM 31 yo female with PMH of unspecified blood clotting disorder, two DVTs, one PE, and reported TIA presenting for chest pain. Patient is Aox3,  no acute distress, afebrile, with stable vitals. ECG demonstrates normal sinus rhythm. No ST elevation or depression. No delta wave. Troponin wnl. Stable electrolytes. CXR demonstrates no pneumothorax. No focal consultations.D-dimer from 2 days ago was normal. Doubt PE.  After discussion with patient regarding normal test results and plan for discharge patient starts to express concern for leg pain. Admitting to hx of bilateral calf pain and new paraesthesias bilaterally. Low likelihood of DVT due to negative d-dimer, however, due to hx of clotting disorder will order [place] for DVT study.  Vascular US demonstrates no DVT. Upon further discussion with patient and plan for discharge patient now expresses concerns for a stroke stating she had a previous TIA with symptoms of leg pain. 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 After spending 15 minute repeat discussion with patient patient states "I do not feel safe going home if I might be having a stroke". I informed patient that it is unlikely to have a stroke affecting both legs at the same time.. At this time patient endorses worse symptoms in the left leg. Reported sensation deficits in left leg on exam. No motor deficits. Neurology consulted for second opinion. Neurology suspects Conversion Disorder but requests MRI to definitively rule out stroke.   MRI negative for acute stroke.   5:04 PM Upon further explaining MRI results with patient and plan for discharge patient states "you can not send me home. I'm going to pass out again. Patient informed that her ECG is stable with no signs of life threatening arhythmia. No signs of MI. No  electrolyte abnormalities. No hypoglycemia.  No witnessed syncope throughout over 12 hour ED stay and stable vitals throughout entire visit. Patient states she took a picture of a blood pressure of 98/65 here in ED today. Pt has had over eight blood pressures all reading normal. Concern that BP 98/65 was an error. Will do orthostatics with plan for discharge if normal and follow up with cardiology for echocardiogram outpatient.   5:29 PM Orthostatics demonstrate no hypotension. No hypoxia while ambulating. No difficulty ambulating. No witnessed syncope throughout 20 hour ED stay. Patient recommended for close follow up with cardiology outpatient for repeat echocardiogram for reported syncope to  rule out valvular disease.   Patient in no distress and overall condition improved here in the ED. Detailed discussions were had with the patient regarding current findings, and need for close f/u with PCP or on call doctor. The patient has been instructed to return immediately if the symptoms worsen in any way for re-evaluation. Patient verbalized understanding and is in agreement with current care plan. All questions answered prior to discharge.         Final Clinical Impression(s) / ED Diagnoses Final diagnoses:  Chest pain, unspecified type  Pain in both lower extremities  Reported syncope    Rx / DC Orders ED Discharge Orders     None         Lianne Cure, DO [phone]

## 2021-12-11 NOTE — ED Notes (Signed)
Pt ambulated around room, O2 stayed at 98-100 percent

## 2021-12-11 NOTE — ED Notes (Signed)
Patient transported to MRI 

## 2021-12-17 ENCOUNTER — Ambulatory Visit: Payer: Medicaid Other | Admitting: Family Medicine

## 2021-12-26 ENCOUNTER — Ambulatory Visit: Payer: Medicaid Other | Admitting: Family Medicine

## 2021-12-26 ENCOUNTER — Other Ambulatory Visit: Payer: Self-pay

## 2021-12-26 ENCOUNTER — Encounter: Payer: Self-pay | Admitting: Family Medicine

## 2021-12-26 VITALS — BP 110/55 | HR 69 | Resp 18 | Ht 63.0 in | Wt 228.0 lb

## 2021-12-26 DIAGNOSIS — I513 Intracardiac thrombosis, not elsewhere classified: Secondary | ICD-10-CM | POA: Diagnosis not present

## 2021-12-26 DIAGNOSIS — Z8639 Personal history of other endocrine, nutritional and metabolic disease: Secondary | ICD-10-CM | POA: Diagnosis not present

## 2021-12-26 DIAGNOSIS — K219 Gastro-esophageal reflux disease without esophagitis: Secondary | ICD-10-CM | POA: Diagnosis not present

## 2021-12-26 DIAGNOSIS — N926 Irregular menstruation, unspecified: Secondary | ICD-10-CM | POA: Diagnosis not present

## 2021-12-26 LAB — HCG, QUANTITATIVE, PREGNANCY: HCG, Total, QN: <3 m[IU]/mL

## 2021-12-26 MED ORDER — PANTOPRAZOLE SODIUM 40 MG PO TBEC: 40.0000 mg | DELAYED_RELEASE_TABLET | Freq: Every day | ORAL | 1 refills | Status: DC

## 2021-12-26 NOTE — Assessment & Plan Note (Signed)
Do think her symptoms are most consistent with GERD.  We discussed a trial of a PPI she says she remembers taking Protonix a long time ago and is willing to try that again.  If it is helpful after 1 to 2 weeks then recommend a full 6-week course.  Discussed that this is long enough to even treat a gastric ulcer if at that point she is doing well then can taper off of the medication if at any point she still having breakthrough symptoms on the medication then consider GI referral for further work-up.

## 2021-12-26 NOTE — Progress Notes (Signed)
Established Patient Office Visit  Subjective:  Patient ID: [name], female    DOB: 1991/07/18  Age: 31 y.o. MRN: [id number]  CC:  Chief Complaint  Patient presents with   Establish Care   Referral    Cardiologist and GI. Patient would like to see [facility] cardiologist. Patient complains of heartburn for 1 month and would like to see a G provider.    Menstrual Problem    Patient states she was told in the ED that she was pregnant. Patient states she has a tubal Ligation and would like to confirm pregnancy.     HPI [name] presents for establish care here at our clinic.  I actually see both of her parents.  She is previously seeing [facility] Industries[facility]orah Holbert, PA.  She is scheduled for a vaginal hysterectomy in March.  She has had difficulty with heavy bleeding.  More recently she was seen in the emergency department and had a questionable pregnancy test initial point-of-care test was positive for elevated hCG but then the repeat a few hours later was actually negative.  She is about 1 week late for her period now and has had a lot of breast tenderness over the last week.  She just wants to make sure that she is not pregnant as she is scheduled for hysterectomy in March.  Also reports that she has had significant heartburn for about the last month or so.  She gets some relief when she belches.  She has not had any liquid or fluid coming back up to her throat.  No brash.  She does drink orange and cranberry juice.  Tums does help.  She denies any major dietary changes.  She really tries to avoid caffeine.  History of iron deficiency.  She ended up stopping her iron because it was causing a lot of GI upset and constipation but she is working with her hematologist they are following her levels and they discussed that if they drop low again then they will consider iron infusion instead.  She would also like to establish with a new cardiologist.  Her previous cardiologist left the  practice she like to see Dr. Gypsy Balsamobert Krasowski whom her mother sees.  Past Medical History:  Diagnosis Date   Asthma    Constipation    COVID 07/2020   Depression    PTSD (post-traumatic stress disorder)    Thrombosis of right atrium    Tobacco abuse    UTI (urinary tract infection)     Past Surgical History:  Procedure Laterality Date   btl     CARDIAC SURGERY     CHOLECYSTECTOMY     TEE WITHOUT CARDIOVERSION N/A 07/09/2016   Procedure: TRANSESOPHAGEAL ECHOCARDIOGRAM (TEE);  Surgeon: Orpah CobbAjay Kadakia, MD;  [facility];  Service: Cardiovascular;  Laterality: N/A;   TEE WITHOUT CARDIOVERSION N/A 03/19/2017   Procedure: TRANSESOPHAGEAL ECHOCARDIOGRAM (TEE);  Surgeon: Orpah CobbAjay Kadakia, MD;  [facility];  Service: Cardiovascular;  Laterality: N/A;    Family History  Problem Relation Age of Onset   Bipolar disorder Mother    Glaucoma Mother    Arthritis Father    Lupus Sister    Healthy Sister    Healthy Sister    Healthy Brother     Social History   Socioeconomic History   Marital status: Single    Spouse name: Not on file   Number of children: 2   Years of education: Not on file   Highest education level: Not on  file  Occupational History   Occupation: unemployed  Tobacco Use   Smoking status: Some Days    Types: Cigarettes   Smokeless tobacco: Current   Tobacco comments:    Vape   Vaping Use   Vaping Use: Every day  Substance and Sexual Activity   Alcohol use: No   Drug use: No   Sexual activity: Yes    Birth control/protection: Surgical  Other Topics Concern   Not on file  Social History Narrative   No regular exercise. No caffeine.    Social Determinants of Health   Financial Resource Strain: Not on file  Food Insecurity: Not on file  Transportation Needs: Not on file  Physical Activity: Not on file  Stress: Not on file  Social Connections: Not on file  Intimate Partner Violence: Not on file    Outpatient Medications Prior to  Visit  Medication Sig Dispense Refill   apixaban (ELIQUIS) 5 MG TABS tablet Take 1 tablet (5 mg total) by mouth 2 (two) times daily. 60 tablet 1   naloxone (NARCAN) nasal spray 4 mg/0.1 mL SMARTSIG:Both Nares     ondansetron (ZOFRAN) 4 MG tablet Take 1 tablet (4 mg total) by mouth every 6 (six) hours. 12 tablet 0   oxyCODONE-acetaminophen (PERCOCET) 10-325 MG tablet Take 1 tablet by mouth every 8 (eight) hours as needed for pain.     No facility-administered medications prior to visit.    Allergies  Allergen Reactions   Fluoxetine Other (See Comments)    Pt told not to take with eliquis   Tramadol Rash    Flushed  Patient states she is not allergic to medication   Ceftriaxone Itching and Other (See Comments)    Red man syndrome   Gabapentin Other (See Comments)    Confusion/ chest pains   Levaquin [Levofloxacin] Other (See Comments)    Severe abdominal pain   Morphine And Related Itching and Nausea And Vomiting   Nsaids Other (See Comments)    Pt told by hematologist not to take   Topamax [Topiramate] Other (See Comments)    Dizziness, black out spells   Toradol [Ketorolac Tromethamine] Itching and Other (See Comments)    Felt hot   Vicodin [Hydrocodone-Acetaminophen] Other (See Comments)    hallucinations   Depakote [Divalproex Sodium] Rash and Other (See Comments)    Skin felt like it was on fire   Methocarbamol Rash   Metoclopramide Rash and Hives    ROS Review of Systems    Objective:    Physical Exam Constitutional:      Appearance: Normal appearance. She is well-developed.  HENT:     Head: Normocephalic and atraumatic.  Cardiovascular:     Rate and Rhythm: Normal rate and regular rhythm.     Heart sounds: Normal heart sounds.  Pulmonary:     Effort: Pulmonary effort is normal.     Breath sounds: Normal breath sounds.  Musculoskeletal:     Cervical back: Neck supple. No tenderness.  Lymphadenopathy:     Cervical: No cervical adenopathy.  Skin:     General: Skin is warm and dry.  Neurological:     Mental Status: She is alert and oriented to person, place, and time.  Psychiatric:        Behavior: Behavior normal.    BP (!) 110/55    Pulse 69    Resp 18    Ht 5\' 3"  (1.6 m)    Wt 228 lb (103.4 kg)    LMP  12/12/2021    SpO2 99%    BMI 40.39 kg/m  Wt Readings from Last 3 Encounters:  12/26/21 228 lb (103.4 kg)  12/10/21 242 lb 8.1 oz (110 kg)  12/09/21 225 lb (102.1 kg)     Health Maintenance Due  Topic Date Due   Hepatitis C Screening  Never done   PAP SMEAR-Modifier  Never done    There are no preventive care reminders to display for this patient.  Lab Results  Component Value Date   TSH 1.598 02/19/2011   Lab Results  Component Value Date   WBC 7.0 12/10/2021   HGB 14.1 12/10/2021   HCT 42.0 12/10/2021   MCV 86.4 12/10/2021   PLT 393 12/10/2021   Lab Results  Component Value Date   NA 136 12/10/2021   K 3.6 12/10/2021   CO2 24 12/10/2021   GLUCOSE 82 12/10/2021   BUN 10 12/10/2021   CREATININE 0.66 12/10/2021   BILITOT 0.4 11/22/2021   ALKPHOS 44 11/22/2021   AST 13 (L) 11/22/2021   ALT 6 11/22/2021   PROT 7.1 11/22/2021   ALBUMIN 3.9 11/22/2021   CALCIUM 8.9 12/10/2021   ANIONGAP 7 12/10/2021   Lab Results  Component Value Date   CHOL 176 03/18/2017   Lab Results  Component Value Date   HDL 36 (L) 03/18/2017   Lab Results  Component Value Date   LDLCALC 92 03/18/2017   Lab Results  Component Value Date   TRIG 239 (H) 03/18/2017   Lab Results  Component Value Date   CHOLHDL 4.9 03/18/2017   Lab Results  Component Value Date   HGBA1C 5.3 03/18/2017      Assessment & Plan:   Problem List Items Addressed This Visit       Cardiovascular and Mediastinum   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 Bing MatterKrasowski whom her mother sees.      Relevant Orders    Ambulatory referral to Cardiology     Digestive   Gastroesophageal reflux disease without esophagitis    Do think her symptoms are most consistent with GERD.  We discussed a trial of a PPI she says she remembers taking Protonix a long time ago and is willing to try that again.  If it is helpful after 1 to 2 weeks then recommend a full 6-week course.  Discussed that this is long enough to even treat a gastric ulcer if at that point she is doing well then can taper off of the medication if at any point she still having breakthrough symptoms on the medication then consider GI referral for further work-up.      Relevant Medications   pantoprazole (PROTONIX) 40 MG tablet     Other   History of iron deficiency   Other Visit Diagnoses     Menstrual period late    -  Primary   Relevant Orders   B-HCG Quant      Late menstrual period-We will get labs for hCG today to confirm if she really is pregnant or not.  If it comes back elevated then we will get an ultrasound.  She has had her tubes tied.  Meds ordered this encounter  Medications   pantoprazole (PROTONIX) 40 MG tablet    Sig: Take 1 tablet (40 mg total) by mouth daily. Take about 30 min before first meal of day  Dispense:  30 tablet    Refill:  1    Follow-up: No follow-ups on file.    Nani Gasser, MD

## 2021-12-26 NOTE — Assessment & Plan Note (Signed)
Typically follows every 6 to 12 months with her cardiologist we will place new referral to Dr. Bing Matter whom her mother sees.

## 2021-12-26 NOTE — Progress Notes (Signed)
Hi Jenia,  It was nice to meet you today ( again :))!  Your test was negative for pregnancy hormones, so no worries.

## 2021-12-26 NOTE — Progress Notes (Signed)
Patient states last Pap Smear was done 2022. Patient unsure of name of facility. Patient switching to [facility].

## 2021-12-31 ENCOUNTER — Ambulatory Visit: Payer: Medicaid Other | Admitting: Family Medicine

## 2022-01-08 DIAGNOSIS — K5900 Constipation, unspecified: Secondary | ICD-10-CM | POA: Insufficient documentation

## 2022-01-08 DIAGNOSIS — J45909 Unspecified asthma, uncomplicated: Secondary | ICD-10-CM | POA: Insufficient documentation

## 2022-01-09 ENCOUNTER — Ambulatory Visit (INDEPENDENT_AMBULATORY_CARE_PROVIDER_SITE_OTHER): Payer: Medicaid Other | Admitting: Cardiology

## 2022-01-09 ENCOUNTER — Encounter: Payer: Self-pay | Admitting: Cardiology

## 2022-01-09 ENCOUNTER — Other Ambulatory Visit: Payer: Self-pay

## 2022-01-09 VITALS — BP 110/78 | HR 80 | Ht 63.0 in | Wt 225.0 lb

## 2022-01-09 DIAGNOSIS — G459 Transient cerebral ischemic attack, unspecified: Secondary | ICD-10-CM

## 2022-01-09 DIAGNOSIS — R0789 Other chest pain: Secondary | ICD-10-CM | POA: Diagnosis not present

## 2022-01-09 DIAGNOSIS — Z720 Tobacco use: Secondary | ICD-10-CM

## 2022-01-09 DIAGNOSIS — D689 Coagulation defect, unspecified: Secondary | ICD-10-CM | POA: Diagnosis not present

## 2022-01-09 NOTE — Progress Notes (Signed)
Cardiology Consultation:    Date:  01/09/2022   ID:  [name], DOB [birth year], MRN [id number]  PCP:  Hali Marry, MD  Cardiologist:  Jenne Campus, MD   Referring MD: Hali Marry, *   No chief complaint on file. Chest pain  History of Present Illness:    [name] is a 31 y.o. female who is being seen today for the evaluation of chest pain at the request of Hali Marry, *.  Lady have incredible story.  Apparently she does have some coagulopathy.  However extensive hematological work-up have been unrevealing.  In 2014 she ended up having what appears to be PE from the description she gave me she was transferred to [facility] and she required surgical removal of her blood clot from the right atrium.  After that she was put on anticoagulation however in 2016 because of some changes in insurance and financial issues she stopped taking Eliquis and not having another DVT and PE.  At that time transesophageal echocardiogram being done which showed presence of right atrial thrombi.  She was put back on anticoagulation seems to be doing quite well since that time.  Interestingly at that time she was found to have PFO.  Also in 2018 she was find to have probably TIA and again it was blamed on the fact that she did not comply with her medications.  She comes today to my office to be evaluated for chest pain.  She said she gets chest pain for many years pain is located in the epigastrium and left side of the chest not related to exercise not related to eating.  Can last almost all day.  She had this episode quite often.  She had multiple evaluation done for the pain multiple ER visit every single time dose visits unrevealing.  I initiated conversation with her about potentially doing coronary CT angio and she tells me that she did have it done in [place] recently.  I do not have report of it however she said that everything was fine.  She described to have  some fatigue tiredness and shortness of breath.  She does not exercise on the regular basis. She does have 2 children.  She is getting ready to have hysterectomy however she tells me today she probably got postponed the surgery.  The reason for hysterectomy is a lot of pelvic pain.  Past Medical History:  Diagnosis Date   Asthma    Constipation    COVID 07/2020   Depression    PTSD (post-traumatic stress disorder)    Thrombosis of right atrium    Tobacco abuse    UTI (urinary tract infection)     Past Surgical History:  Procedure Laterality Date   btl     CARDIAC SURGERY     CHOLECYSTECTOMY     TEE WITHOUT CARDIOVERSION N/A 07/09/2016   Procedure: TRANSESOPHAGEAL ECHOCARDIOGRAM (TEE);  Surgeon: Dixie Dials, MD;  Location: [facility] ENDOSCOPY;  Service: Cardiovascular;  Laterality: N/A;   TEE WITHOUT CARDIOVERSION N/A 03/19/2017   Procedure: TRANSESOPHAGEAL ECHOCARDIOGRAM (TEE);  Surgeon: Dixie Dials, MD;  Location: [facility] ENDOSCOPY;  Service: Cardiovascular;  Laterality: N/A;    Current Medications: Current Meds  Medication Sig   apixaban (ELIQUIS) 5 MG TABS tablet Take 1 tablet (5 mg total) by mouth 2 (two) times daily.   naloxone (NARCAN) nasal spray 4 mg/0.1 mL SMARTSIG:Both Nares   ondansetron (ZOFRAN) 4 MG tablet Take 1 tablet (4 mg total) by mouth every  6 (six) hours.   oxyCODONE-acetaminophen (PERCOCET) 10-325 MG tablet Take 1 tablet by mouth every 8 (eight) hours as needed for pain.   pantoprazole (PROTONIX) 40 MG tablet Take 1 tablet (40 mg total) by mouth daily. Take about 30 min before first meal of day     Allergies:   Fluoxetine, Tramadol, Ceftriaxone, Gabapentin, Levaquin [levofloxacin], Morphine and related, Nsaids, Topamax [topiramate], Toradol [ketorolac tromethamine], Vicodin [hydrocodone-acetaminophen], Depakote [divalproex sodium], Methocarbamol, and Metoclopramide   Social History   Socioeconomic History   Marital status: Single    Spouse name: Not on file    Number of children: 2   Years of education: Not on file   Highest education level: Not on file  Occupational History   Occupation: unemployed  Tobacco Use   Smoking status: Some Days    Types: Cigarettes   Smokeless tobacco: Current   Tobacco comments:    Vape   Vaping Use   Vaping Use: Every day  Substance and Sexual Activity   Alcohol use: No   Drug use: No   Sexual activity: Yes    Birth control/protection: Surgical  Other Topics Concern   Not on file  Social History Narrative   No regular exercise. No caffeine.    Social Determinants of Health   Financial Resource Strain: Not on file  Food Insecurity: Not on file  Transportation Needs: Not on file  Physical Activity: Not on file  Stress: Not on file  Social Connections: Not on file     Family History: The patient's family history includes Arthritis in her father; Bipolar disorder in her mother; Glaucoma in her mother; Healthy in her brother, sister, and sister; Lupus in her sister. ROS:   Please see the history of present illness.    All 14 point review of systems negative except as described per history of present illness.  EKGs/Labs/Other Studies Reviewed:    The following studies were reviewed today: Last echocardiogram I find in the computer was from 2018 when TEE was performed showing some left atrium blood clots,  Recently in January 3 she had DVT study of lower extremities done which showed no evidence of DVT.  EKG:  EKG is  ordered today.  The ekg ordered today demonstrates normal sinus rhythm, normal P interval, normal QS complex duration morphology, nonspecific ST segment changes  Recent Labs: 11/22/2021: ALT 6 12/10/2021: BUN 10; Creatinine, Ser 0.66; Hemoglobin 14.1; Platelets 393; Potassium 3.6; Sodium 136  Recent Lipid Panel    Component Value Date/Time   CHOL 176 03/18/2017 0406   TRIG 239 (H) 03/18/2017 0406   HDL 36 (L) 03/18/2017 0406   CHOLHDL 4.9 03/18/2017 0406   VLDL 48 (H) 03/18/2017  0406   LDLCALC 92 03/18/2017 0406    Physical Exam:    VS:  BP 110/78 (BP Location: Right Arm)    Pulse 80    Ht 5\' 3"  (1.6 m)    Wt 225 lb (102.1 kg)    LMP 12/12/2021    SpO2 98%    BMI 39.86 kg/m     Wt Readings from Last 3 Encounters:  01/09/22 225 lb (102.1 kg)  12/26/21 228 lb (103.4 kg)  12/10/21 242 lb 8.1 oz (110 kg)     GEN:  Well nourished, well developed in no acute distress HEENT: Normal NECK: No JVD; No carotid bruits LYMPHATICS: No lymphadenopathy CARDIAC: RRR, no murmurs, no rubs, no gallops RESPIRATORY:  Clear to auscultation without rales, wheezing or rhonchi  ABDOMEN: Soft, non-tender,  non-distended MUSCULOSKELETAL:  No edema; No deformity  SKIN: Warm and dry NEUROLOGIC:  Alert and oriented x 3 PSYCHIATRIC:  Normal affect   ASSESSMENT:    1. Atypical chest pain   2. Tobacco abuse   3. TIA (transient ischemic attack)   4. Clotting disorder ([place])    PLAN:    In order of problems listed above:  Atypical chest pain with multiple evaluations before.  It looks like she did have recently coronary CT angio in [place].  I will try to get a copy of this report.  I will not pursue any cardiac work-up until I look at the coronary CT angio.  I will be worried about potentially having obstructive disease caused by embolization or anomalous coronary arteries.  Her symptomatology is not typical but with her past medical history concerning.  She is being anticoagulated for now.  I will ask her to have an echocardiogram done to look at the right ventricle size and function. Tobacco abuse obviously huge problem we spent a great deal of time talking about that she need to quit he understand that. History of TIA which was blamed on the fact that she did not comply with her medications.  She did not take anticoagulation.  She does have known PFO and that is something that need to be reconsidered in the future potential closure however the key for her is to take anticoagulation.   If she is able to maintain her anticoagulation which she should I do not see indication for PFO closure.  Unless she end up having TIA/CVA in spite of anticoagulation that she probably takes. Clotting disorder so far unanswered questions a work type.  Extensive work-up has been performed. She said that she need to have hysterectomy done from cardiac standpoint to view if echocardiogram repeated will be fine and coronary CT angio done recently I see no contraindication however we need to take into the coverage situation defined event interrupted to coagulation for the surgery which obviously can carry some significant risk in her situation.   Medication Adjustments/Labs and Tests Ordered: Current medicines are reviewed at length with the patient today.  Concerns regarding medicines are outlined above.  Orders Placed This Encounter  Procedures   EKG 12-Lead   ECHOCARDIOGRAM COMPLETE   No orders of the defined types were placed in this encounter.   Signed, Park Liter, MD, [facility]. 01/09/2022 11:46 AM    [place]

## 2022-01-09 NOTE — Patient Instructions (Signed)
Medication Instructions:  ?Your physician recommends that you continue on your current medications as directed. Please refer to the Current Medication list given to you today. ? ?*If you need a refill on your cardiac medications before your next appointment, please call your pharmacy* ? ? ?Lab Work: ?None ?If you have labs (blood work) drawn today and your tests are completely normal, you will receive your results only by: ?MyChart Message (if you have MyChart) OR ?A paper copy in the mail ?If you have any lab test that is abnormal or we need to change your treatment, we will call you to review the results. ? ? ?Testing/Procedures: ?Your physician has requested that you have an echocardiogram. Echocardiography is a painless test that uses sound waves to create images of your heart. It provides your doctor with information about the size and shape of your heart and how well your heart?s chambers and valves are working. This procedure takes approximately one hour. There are no restrictions for this procedure.  ? ? ?Follow-Up: ?At [facility], you and your health needs are our priority.  As part of our continuing mission to provide you with exceptional heart care, we have created designated Provider Care Teams.  These Care Teams include your primary Cardiologist (physician) and Advanced Practice Providers (APPs -  Physician Assistants and Nurse Practitioners) who all work together to provide you with the care you need, when you need it. ? ?We recommend signing up for the patient portal called "MyChart".  Sign up information is provided on this After Visit Summary.  MyChart is used to connect with patients for Virtual Visits (Telemedicine).  Patients are able to view lab/test results, encounter notes, upcoming appointments, etc.  Non-urgent messages can be sent to your provider as well.   ?To learn more about what you can do with MyChart, go to [email or web address].   ? ?Your next appointment:   ?6 week(s) ? ?The  format for your next appointment:   ?In Person ? ?Provider:   ?Robert Krasowski, MD  ? ? ?Other Instructions ?None ? ?

## 2022-01-14 ENCOUNTER — Encounter (HOSPITAL_COMMUNITY): Payer: Self-pay

## 2022-01-14 ENCOUNTER — Emergency Department (HOSPITAL_COMMUNITY)
Admission: EM | Admit: 2022-01-14 | Discharge: 2022-01-14 | Disposition: A | Discharge status: Home / Self Care | Payer: Medicaid Other | Attending: Emergency Medicine | Admitting: Emergency Medicine

## 2022-01-14 ENCOUNTER — Emergency Department (HOSPITAL_COMMUNITY): Payer: Medicaid Other

## 2022-01-14 ENCOUNTER — Other Ambulatory Visit: Payer: Self-pay

## 2022-01-14 ENCOUNTER — Telehealth: Payer: Self-pay | Admitting: Cardiology

## 2022-01-14 DIAGNOSIS — R0602 Shortness of breath: Secondary | ICD-10-CM | POA: Insufficient documentation

## 2022-01-14 DIAGNOSIS — R079 Chest pain, unspecified: Secondary | ICD-10-CM

## 2022-01-14 DIAGNOSIS — Z7901 Long term (current) use of anticoagulants: Secondary | ICD-10-CM | POA: Insufficient documentation

## 2022-01-14 DIAGNOSIS — R0789 Other chest pain: Secondary | ICD-10-CM | POA: Diagnosis not present

## 2022-01-14 LAB — CBC
HCT: 42.5 % (ref 36.0–46.0)
Hemoglobin: 13.8 g/dL (ref 12.0–15.0)
MCH: 28.8 pg (ref 26.0–34.0)
MCHC: 32.5 g/dL (ref 30.0–36.0)
MCV: 88.5 fL (ref 80.0–100.0)
Platelets: 385 10*3/uL (ref 150–400)
RBC: 4.8 MIL/uL (ref 3.87–5.11)
RDW: 12.5 % (ref 11.5–15.5)
WBC: 6.3 10*3/uL (ref 4.0–10.5)
nRBC: 0 % (ref 0.0–0.2)

## 2022-01-14 LAB — BASIC METABOLIC PANEL
Anion gap: 7 (ref 5–15)
BUN: 6 mg/dL (ref 6–20)
CO2: 26 mmol/L (ref 22–32)
Calcium: 8.8 mg/dL — ABNORMAL LOW (ref 8.9–10.3)
Chloride: 104 mmol/L (ref 98–111)
Creatinine, Ser: 0.67 mg/dL (ref 0.44–1.00)
GFR, Estimated: >60 mL/min (ref >60)
Glucose, Bld: 89 mg/dL (ref 70–99)
Potassium: 3.9 mmol/L (ref 3.5–5.1)
Sodium: 137 mmol/L (ref 135–145)

## 2022-01-14 LAB — TROPONIN I (HIGH SENSITIVITY)
Troponin I (High Sensitivity): <2 ng/L (ref <18)
Troponin I (High Sensitivity): <2 ng/L (ref <18)

## 2022-01-14 LAB — I-STAT BETA HCG BLOOD, ED (MC, WL, AP ONLY): I-stat hCG, quantitative: <5 m[IU]/mL (ref <5)

## 2022-01-14 LAB — D-DIMER, QUANTITATIVE: D-Dimer, Quant: <0.27 ug/mL-FEU (ref 0.00–0.50)

## 2022-01-14 MED ORDER — LIDOCAINE VISCOUS HCL 2 % MT SOLN
15.0000 mL | Freq: Once | OROMUCOSAL | Status: DC
  Filled 2022-01-14: qty 15

## 2022-01-14 MED ORDER — HYDROMORPHONE HCL 1 MG/ML IJ SOLN
1.5000 mg | Freq: Once | INTRAMUSCULAR | Status: AC
  Administered 2022-01-14: 1.5 mg via INTRAVENOUS
  Filled 2022-01-14: qty 2

## 2022-01-14 MED ORDER — ALUM & MAG HYDROXIDE-SIMETH 200-200-20 MG/5ML PO SUSP
30.0000 mL | Freq: Once | ORAL | Status: AC
  Administered 2022-01-14: 30 mL via ORAL
  Filled 2022-01-14: qty 30

## 2022-01-14 MED ORDER — IOHEXOL 350 MG/ML SOLN
55.0000 mL | Freq: Once | INTRAVENOUS | Status: AC | PRN
  Administered 2022-01-14: 55 mL via INTRAVENOUS

## 2022-01-14 NOTE — Discharge Instructions (Signed)
Your medical work-up today was reassuring did not show signs of heart attack, pneumonia, collapsed lung, or blood clots in your lungs.  The cardiologist felt it would be safe for you to follow-up as an outpatient for your echocardiogram to look at your heart again.  Please continue taking your normal medications, including your Eliquis and the medicines to use for acid reflux.  It is possible some of your symptoms may be related to reflux as well.  If you begin to feel dizzy, lightheaded, have difficulty breathing, or other new medical emergencies, come back to the ER

## 2022-01-14 NOTE — ED Notes (Signed)
With EDP's permission, Pt took her home pain medication (10-325) for chronic pelvic pain.

## 2022-01-14 NOTE — ED Provider Triage Note (Signed)
Emergency Medicine Provider Triage Evaluation Note  [name] , a 31 y.o. female  was evaluated in triage.  Pt complains of chest pain and shortness of breath starting 4 days ago.  She states her symptoms are progressively worsening.  Her symptoms are worse in the left side of her chest radiating to her left axilla and left neck.  She feels that she is "drowning" when she lays down to try to sleep at night.  She reports a similar pain in the past with a history of blood clots in the right atrium, states this is most recently in 2017.  She reports she is on Eliquis and has been compliant with this.  She most recently had an appointment for atypical chest pain with cardiology on 2/1 with normal coronary CTA.  Review of Systems  Positive: Chest pain, shortness of breath, nausea Negative: Fever, chills, cough, leg pain, abdominal pain, vomiting  Physical Exam  BP 121/65 (BP Location: Right Arm)    Pulse 85    Temp 98.2 F (36.8 C) (Oral)    Resp 20    Ht 5\' 3"  (1.6 m)    Wt 100.7 kg    SpO2 98%    BMI 39.33 kg/m  Gen:   Awake, no distress   Resp:  Normal effort  MSK:   Moves extremities without difficulty  Other:    Medical Decision Making  Medically screening exam initiated at 11:45 AM.  Appropriate orders placed.  was informed that the remainder of the evaluation will be completed by another provider, this initial triage assessment does not replace that evaluation, and the importance of remaining in the ED until their evaluation is complete.     Dayshawn Irizarry T, PA-C 01/14/22 1150

## 2022-01-14 NOTE — Telephone Encounter (Signed)
Pt c/o of Chest Pain: STAT if CP now or developed within 24 hours  1. Are you having CP right now?  Yes, and in jaw/left arm  2. Are you experiencing any other symptoms (ex. SOB, nausea, vomiting, sweating)?  SOB   3. How long have you been experiencing CP?  Past few days  4. Is your CP continuous or coming and going?  Coming and going   5. Have you taken Nitroglycerin?  No  Call transferred to [place], [place].?

## 2022-01-14 NOTE — ED Provider Notes (Signed)
[facility] EMERGENCY DEPARTMENT Provider Note   CSN: [id number] Arrival date & time: 01/14/22  1120     History  Chief Complaint  Patient presents with   Chest Pain   Shortness of Breath    [name] is a 31 y.o. female with a history of an unclear blood clotting disorder, history of prior DVT and PE, hx of right atrial thrombus and PFO, presenting to the ED with chest discomfort.  Patient was seen most recently twice, month ago in the ED, for similar issues of chest pressure and discomfort.  She had negative troponins and D-dimers at that time.  She returns the ED today complaining of worsening discomfort that started 3 days ago.  She cannot recall the onset.  She says it feels like a heavy pressure in the left side of her chest, rating up towards her left neck.  It is worse with movement.  It was also worse waking up overnight.  She does suffer from reflux but is not compliant with Protonix and Tums did not relieve her symptoms, she does not feel this is reflux related.  She is concerned that it feels like when she had a clot in her heart.  She did have a cardiology appointment and has echocardiogram scheduled this month, but came into the ED because her pressure was worsening.  Patient reports she has been compliant with her Eliquis.  She had a DVT ultrasound of the bilateral lower extremities performed through the ER on January 3, which was unremarkable per my review of the records.  In April 2019 she underwent transesophageal echo, per my review of the report, she was found to have a highly mobile thrombus in the right atrium.  HPI     Home Medications Prior to Admission medications   Medication Sig Start Date End Date Taking? Authorizing Provider  apixaban (ELIQUIS) 5 MG TABS tablet Take 1 tablet (5 mg total) by mouth 2 (two) times daily. 03/26/17  Yes Richarda Overlie, MD  naloxone [facility]) nasal spray 4 mg/0.1 mL SMARTSIG:Both Nares 11/26/21  Yes  [provider]  ondansetron (ZOFRAN) 4 MG tablet Take 1 tablet (4 mg total) by mouth every 6 (six) hours. 12/09/21  Yes Wynetta Fines, MD  oxyCODONE-acetaminophen (PERCOCET) 10-325 MG tablet Take 1 tablet by mouth every 8 (eight) hours as needed for pain.   Yes [provider]  pantoprazole (PROTONIX) 40 MG tablet Take 1 tablet (40 mg total) by mouth daily. Take about 30 min before first meal of day 12/26/21  Yes Agapito Games, MD      Allergies    Fluoxetine, Tramadol, Ceftriaxone, Gabapentin, Levaquin [levofloxacin], Morphine and related, Nsaids, Topamax [topiramate], Toradol [ketorolac tromethamine], Vicodin [hydrocodone-acetaminophen], Depakote [divalproex sodium], Methocarbamol, and Metoclopramide    Review of Systems   Review of Systems  Physical Exam Updated Vital Signs BP 116/67    Pulse 83    Temp 98 F (36.7 C)    Resp 17    Ht 5\' 3"  (1.6 m)    Wt 100.7 kg    LMP 01/04/2022 (Approximate)    SpO2 98%    BMI 39.33 kg/m  Physical Exam Constitutional:      General: She is not in acute distress. HENT:     Head: Normocephalic and atraumatic.  Eyes:     Conjunctiva/sclera: Conjunctivae normal.     Pupils: Pupils are equal, round, and reactive to light.  Cardiovascular:     Rate and Rhythm: Normal  rate and regular rhythm.  Pulmonary:     Effort: Pulmonary effort is normal. No respiratory distress.  Abdominal:     General: There is no distension.     Tenderness: There is no abdominal tenderness.  Skin:    General: Skin is warm and dry.  Neurological:     General: No focal deficit present.     Mental Status: She is alert. Mental status is at baseline.  Psychiatric:        Mood and Affect: Mood normal.        Behavior: Behavior normal.    ED Results / Procedures / Treatments   Labs (all labs ordered are listed, but only abnormal results are displayed) Labs Reviewed  BASIC METABOLIC PANEL - Abnormal; Notable for the following components:       Result Value   Calcium 8.8 (*)    All other components within normal limits  CBC  D-DIMER, QUANTITATIVE  I-STAT BETA HCG BLOOD, ED ([facility], [facility], [facility] ONLY)  TROPONIN I (HIGH SENSITIVITY)  TROPONIN I (HIGH SENSITIVITY)    EKG EKG Interpretation  Date/Time:  Monday January 14 2022 11:34:23 EST Ventricular Rate:  84 PR Interval:  144 QRS Duration: 70 QT Interval:  378 QTC Calculation: 446 R Axis:   79 Text Interpretation: Normal sinus rhythm  poor data quality, but no obvious ischemia or change when compared to Jan 2023 Confirmed by Pricilla Loveless [phone]) on 01/14/2022 2:31:14 PM  Radiology DG Chest 2 View  Result Date: 01/14/2022 CLINICAL DATA:  Chest pain and shortness of breath EXAM: CHEST - 2 VIEW COMPARISON:  12/10/2021 FINDINGS: No focal consolidation. No pleural effusion or pneumothorax. Heart and mediastinal contours are unremarkable. Median sternotomy No acute osseous abnormality. IMPRESSION: No active cardiopulmonary disease. Electronically Signed   By: Elige Ko M.D.   On: 01/14/2022 12:07   CT Angio Chest PE W and/or Wo Contrast  Result Date: 01/14/2022 CLINICAL DATA:  Left chest pain, shortness of breath EXAM: CT ANGIOGRAPHY CHEST WITH CONTRAST TECHNIQUE: Multidetector CT imaging of the chest was performed using the standard protocol during bolus administration of intravenous contrast. Multiplanar CT image reconstructions and MIPs were obtained to evaluate the vascular anatomy. RADIATION DOSE REDUCTION: This exam was performed according to the departmental dose-optimization program which includes automated exposure control, adjustment of the mA and/or kV according to patient size and/or use of iterative reconstruction technique. CONTRAST:  62mL OMNIPAQUE IOHEXOL 350 MG/ML SOLN COMPARISON:  11/01/2020 FINDINGS: Cardiovascular: There is homogeneous enhancement in thoracic aorta. There are no intraluminal filling defects in pulmonary artery branches. There is previous coronary bypass  surgery. Mediastinum/Nodes: Unremarkable. Lungs/Pleura: There is no focal pulmonary consolidation. There is no pleural effusion or pneumothorax. Upper Abdomen: Surgical clips are seen in gallbladder fossa. Musculoskeletal: Unremarkable. Review of the MIP images confirms the above findings. IMPRESSION: There is no evidence of pulmonary artery embolism. There is no evidence of thoracic aortic dissection. There is no focal pulmonary consolidation. Electronically Signed   By: Ernie Avena M.D.   On: 01/14/2022 20:49    Procedures Procedures    Medications Ordered in ED Medications  alum & mag hydroxide-simeth (MAALOX/MYLANTA) 200-200-20 MG/5ML suspension 30 mL (30 mLs Oral Given 01/14/22 1712)  HYDROmorphone (DILAUDID) injection 1.5 mg (1.5 mg Intravenous Given 01/14/22 2000)  iohexol (OMNIPAQUE) 350 MG/ML injection 55 mL (55 mLs Intravenous Contrast Given 01/14/22 2031)    ED Course/ Medical Decision Making/ A&P Clinical Course as of 01/15/22 0911  Mon Jan 14, 2022  1802  Spoke to Dr Algie Coffer who will come evaluate patient [MT]  1913 Dr Algie Coffer clarified that patient has transferred to [facility] group and has not been seen by him in several years; heartcare attending paged [MT]  1930 I spoke to Dr Antoine Poche from [facility] regarding the patient's presentation.  He feels this is very unlikely to be chest pain related to an intracardiac clot, but raises the possibility of a possible pulmonary embolism, despite negative D-dimer test.  I think it is reasonable to obtain this work-up and spoke to the patient know CT PE scan.  We can also give her some IV Dilaudid for pain (she reports that she does have allergies to morphine with nausea and itching, but has tolerated Dilaudid without problems).  If the scan is negative, her cardiology team felt that it would be reasonable for her to f/u as an outpatient for her scheduled echocardiogram this month [MT]  1930 Patient is feeling significantly better after the  Dilaudid.  She was able to eat and no longer feels nauseous.  We discussed her work-up which is negative and unremarkable, no acute PE, she can follow-up with cardiology [MT]    Clinical Course User Index [MT] Juanette Urizar, Kermit Balo, MD                           Medical Decision Making Amount and/or Complexity of Data Reviewed Labs: ordered. Radiology: ordered.  Risk OTC drugs. Prescription drug management.   This patient presents to the Emergency Department with complaint of chest pain. This involves an extensive number of treatment options, and is a complaint that carries with it a high risk of complications and morbidity, given the patient's comorbidity, including prior clotting disorder on Eliquis.The differential diagnosis includes ACS vs Pneumothorax vs recurring thrombus vs Reflux/Gastritis vs MSK pain vs Pneumonia vs other.  I ordered, reviewed, and interpreted labs.  Pertinent results include troponin undetectable.  No acute anemia.  No leukocytosis. I ordered medication GI cocktail (pt refused lidocaine), IV dilaudid for chest pain I ordered imaging studies which included x-ray of the chest, CTPE I independently visualized and interpreted imaging which showed no focal infiltrate or pneumothorax, and the monitor tracing which showed normal sinus rhythm.. I agree with the radiologist interpretation External records obtained and reviewed showing cardiology outpatient records as noted in history above; TEE record; recent office evaluation 2/1  I personally reviewed the patients ECG which showed sinus rhythm with no acute ischemic findings  After the interventions stated above, I reevaluated the patient and found that they were clinically improved; pain improved  Based on the patient's clinical exam, vital signs, risk factors, and ED testing, I felt that the patient's overall risk of life-threatening emergency such as ACS, PE, sepsis, or infection was low.  At this time, I felt the  patient's presentation was most clinically consistent with nonspecific chest pain and/or reflux, but explained to the patient that this evaluation was not a definitive diagnostic workup.  I discussed outpatient follow up with primary care provider, and provided specialist office number on the patient's discharge paper if a referral was deemed necessary.  Return precautions were discussed with the patient.  I felt the patient was clinically stable for discharge.         Final Clinical Impression(s) / ED Diagnoses Final diagnoses:  Chest pain, unspecified type    Rx / DC Orders ED Discharge Orders     None  Terald Sleeperrifan, Trinette Vera J, MD 01/15/22 [phone]

## 2022-01-14 NOTE — ED Triage Notes (Addendum)
Pt arrives POV for eval of chest pain and SOB onset Friday evening, progressively worsening. 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 Reports she is on eliquis and states compliant.

## 2022-01-14 NOTE — Telephone Encounter (Signed)
Advised to call 911 or go to the ED as her sx are concerning for MI. Pt verbalized understanding and had no additional questions.

## 2022-01-16 ENCOUNTER — Telehealth: Payer: Self-pay | Admitting: General Practice

## 2022-01-16 NOTE — Telephone Encounter (Signed)
Transition Care Management Follow-up Telephone Call Date of discharge and from where: 01/14/22 from [facility] How have you been since you were released from the hospital? Still has some chest pain but feeling better over all. She is schedule to have her echo next week (2/15).  Any questions or concerns? No  Items Reviewed: Did the pt receive and understand the discharge instructions provided? No  Medications obtained and verified? Yes  Other? No  Any new allergies since your discharge? No  Dietary orders reviewed? Yes Do you have support at home? Yes   Home Care and Equipment/Supplies: Were home health services ordered? no  Functional Questionnaire: (I = Independent and D = Dependent) ADLs: I  Bathing/Dressing- I  Meal Prep- I  Eating- I  Maintaining continence- I  Transferring/Ambulation- I  Managing Meds- I  Follow up appointments reviewed:  PCP Hospital f/u appt confirmed? No   Specialist Hospital f/u appt confirmed? Yes  Scheduled to see the cardiologist and echo on 01/23/22. Are transportation arrangements needed? No  If their condition worsens, is the pt aware to call PCP or go to the Emergency Dept.? Yes Was the patient provided with contact information for the PCP's office or ED? Yes Was to pt encouraged to call back with questions or concerns? Yes

## 2022-01-17 ENCOUNTER — Ambulatory Visit: Payer: Medicaid Other | Admitting: Obstetrics and Gynecology

## 2022-01-21 ENCOUNTER — Ambulatory Visit: Payer: Medicaid Other | Admitting: Obstetrics & Gynecology

## 2022-01-23 ENCOUNTER — Ambulatory Visit (HOSPITAL_BASED_OUTPATIENT_CLINIC_OR_DEPARTMENT_OTHER): Payer: Medicaid Other

## 2022-01-28 ENCOUNTER — Ambulatory Visit (HOSPITAL_BASED_OUTPATIENT_CLINIC_OR_DEPARTMENT_OTHER): Payer: Medicaid Other

## 2022-01-31 ENCOUNTER — Ambulatory Visit (HOSPITAL_BASED_OUTPATIENT_CLINIC_OR_DEPARTMENT_OTHER): Payer: Medicaid Other

## 2022-02-04 ENCOUNTER — Telehealth: Payer: Self-pay | Admitting: Obstetrics and Gynecology

## 2022-02-04 ENCOUNTER — Inpatient Hospital Stay: Payer: Medicaid Other | Attending: Hematology & Oncology

## 2022-02-04 ENCOUNTER — Encounter (HOSPITAL_BASED_OUTPATIENT_CLINIC_OR_DEPARTMENT_OTHER): Payer: Self-pay | Admitting: Obstetrics and Gynecology

## 2022-02-04 ENCOUNTER — Inpatient Hospital Stay: Payer: Medicaid Other | Admitting: Hematology & Oncology

## 2022-02-04 NOTE — Telephone Encounter (Signed)
Spoke with [name]. She has been diagnosed with another DVT and has been going through a new cardiac work up for her h/o thrombus/PFO and cardiac symptoms. She had a recent CTPE which was negative for PE.   She has not had her follow up yet with her hem/onc because she plans to postpose the hysterectomy.   She still has the bleeding and pain, but at this time does not want to do any medication for it. Her hgb in the beginning of February was 13. We discussed if she started having HVB, she could do LNG IUD (she did one in the past and did not tolerate it) or she could do POP or Depo. I would avoid Nexplanon in this case. We discussed megace but more potent and higher risk of clot so would not be my first line unless she is acutely bleeding then it would be very appropriate. Again, she declines therapy at this time.   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 I told her this is up to her based on her bleeding. She would like to try.   She tried to call scheduler and reports she left message. Informed pt I would send a message and let her know postponement.   She will contact us January 2024 to reschedule surgery (or of course sooner if desires surgery sooner).   Milas Hock, MD Attending Obstetrician & Gynecologist, [facility] for [facility], [facility]

## 2022-02-11 ENCOUNTER — Inpatient Hospital Stay (HOSPITAL_COMMUNITY): Admission: RE | Admit: 2022-02-11 | Payer: Medicaid Other | Source: Ambulatory Visit

## 2022-02-13 ENCOUNTER — Ambulatory Visit (HOSPITAL_BASED_OUTPATIENT_CLINIC_OR_DEPARTMENT_OTHER): Admit: 2022-02-13 | Payer: Medicaid Other | Admitting: Obstetrics and Gynecology

## 2022-02-13 ENCOUNTER — Encounter (HOSPITAL_BASED_OUTPATIENT_CLINIC_OR_DEPARTMENT_OTHER): Payer: Self-pay

## 2022-02-13 HISTORY — DX: Personal history of diseases of the blood and blood-forming organs and certain disorders involving the immune mechanism: Z86.2

## 2022-02-13 HISTORY — DX: Coagulation defect, unspecified: D68.9

## 2022-02-13 SURGERY — HYSTERECTOMY, VAGINAL
Anesthesia: Choice | Laterality: Bilateral

## 2022-02-19 ENCOUNTER — Ambulatory Visit (HOSPITAL_BASED_OUTPATIENT_CLINIC_OR_DEPARTMENT_OTHER): Admission: RE | Admit: 2022-02-19 | Payer: Medicaid Other | Source: Ambulatory Visit

## 2022-02-26 ENCOUNTER — Ambulatory Visit: Payer: Medicaid Other | Admitting: Cardiology

## 2022-03-07 ENCOUNTER — Encounter: Payer: Self-pay | Admitting: Family Medicine

## 2022-03-07 NOTE — Telephone Encounter (Signed)
I agree needs appt.  If her insurance doesn't require a referral then she is welcome to call for a therapist herself. Or if she is able to get an appt and needs a ref we can send one ?

## 2022-03-07 NOTE — Telephone Encounter (Signed)
Just established care in January. No mental health issues were discussed. Do they need an appt?  ?

## 2022-03-11 ENCOUNTER — Telehealth: Payer: Self-pay | Admitting: General Practice

## 2022-03-11 NOTE — Telephone Encounter (Signed)
Transition Care Management Follow-up Telephone Call ?Date of discharge and from where: 03/11/22 from [facility] ?How have you been since you were released from the hospital? Still having pain but is not as bad as yesterday. ?Any questions or concerns? No ? ?Items Reviewed: ?Did the pt receive and understand the discharge instructions provided? Yes  ?Medications obtained and verified? No  ?Other? No  ?Any new allergies since your discharge? No  ?Dietary orders reviewed? Yes ?Do you have support at home? Yes  ? ?Home Care and Equipment/Supplies: ?Were home health services ordered? no ? ?Functional Questionnaire: (I = Independent and D = Dependent) ?ADLs: I ? ?Bathing/Dressing- I ? ?Meal Prep- I ? ?Eating- I ? ?Maintaining continence- I ? ?Transferring/Ambulation- I ? ?Managing Meds- I ? ?Follow up appointments reviewed: ? ?PCP Hospital f/u appt confirmed? Yes  Scheduled to see Dr. Linford Arnold on 04/04/22 @ 1440. ?Specialist Hospital f/u appt confirmed? No   ?Are transportation arrangements needed? No  ?If their condition worsens, is the pt aware to call PCP or go to the Emergency Dept.? Yes ?Was the patient provided with contact information for the PCP's office or ED? Yes ?Was to pt encouraged to call back with questions or concerns? Yes  ?

## 2022-03-21 ENCOUNTER — Ambulatory Visit (HOSPITAL_BASED_OUTPATIENT_CLINIC_OR_DEPARTMENT_OTHER): Admission: RE | Admit: 2022-03-21 | Payer: Medicaid Other | Source: Ambulatory Visit

## 2022-04-04 ENCOUNTER — Telehealth (INDEPENDENT_AMBULATORY_CARE_PROVIDER_SITE_OTHER): Payer: Medicaid Other | Admitting: Family Medicine

## 2022-04-04 ENCOUNTER — Encounter: Payer: Self-pay | Admitting: Family Medicine

## 2022-04-04 VITALS — Ht 63.0 in | Wt 227.0 lb

## 2022-04-04 DIAGNOSIS — F4310 Post-traumatic stress disorder, unspecified: Secondary | ICD-10-CM

## 2022-04-04 DIAGNOSIS — F39 Unspecified mood [affective] disorder: Secondary | ICD-10-CM | POA: Diagnosis not present

## 2022-04-04 DIAGNOSIS — G8929 Other chronic pain: Secondary | ICD-10-CM | POA: Diagnosis not present

## 2022-04-04 DIAGNOSIS — G43809 Other migraine, not intractable, without status migrainosus: Secondary | ICD-10-CM

## 2022-04-04 DIAGNOSIS — F320 Major depressive disorder, single episode, mild: Secondary | ICD-10-CM | POA: Diagnosis not present

## 2022-04-04 MED ORDER — RIZATRIPTAN BENZOATE 10 MG PO TBDP
10.0000 mg | ORAL_TABLET | ORAL | 2 refills | Status: DC | PRN
Start: 2022-04-04 — End: 2023-10-23

## 2022-04-04 NOTE — Assessment & Plan Note (Addendum)
Discussed optoins. Since she has only had one migraine this year will start but treating prophylaxis.  Will start with Maxalt Mlt.  Monitor future migraines.  Keep track of how often she is having them if they become more frequent again we can also discuss prophylactic options.  She says weather seems to be a trigger as well as increased stress levels. ?

## 2022-04-04 NOTE — Progress Notes (Signed)
? ? ?Virtual Visit via Video Note ? ?I connected with [name] on 04/04/22 at  2:40 PM EDT by a video enabled telemedicine application and verified that I am speaking with the correct person using two identifiers. ?  ?I discussed the limitations of evaluation and management by telemedicine and the availability of in person appointments. The patient expressed understanding and agreed to proceed. ? ?Patient location: in her car.   ?Provider location: in office ? ?Subjective:   ? ?CC:   ?Chief Complaint  ?Patient presents with  ? Follow-up  ?  Migraine ?  ? Referral  ?  [facility] referral for downstairs.  ? ? ?HPI: ?Pt was seen in the ED on 4/2 for migraine headache.  No aura.. She stated that this was her first in over 1 year.  ? She is doing ok at present and states that the headaches are not as bad as it was.  ?  ?She has been seen by Neurology in the past and doesn't feel that she needs to go back at this time. She was prescribed baclofen and getting injections at that time to help with the headaches.  She does not have any rescue medication.  She feels like the migraine that she had was typical of the ones that she used to have there was nothing really unusual about it. ?  ?Pt asked for a referral downstairs to [facility] for therapy. She prefers a female provider.   ? ?She did have an eye exam today and says she is getting new glasses. ? ? ?Past medical history, Surgical history, Family history not pertinant except as noted below, Social history, Allergies, and medications have been entered into the medical record, reviewed, and corrections made.  ? ? ?Objective:   ? ?General: Speaking clearly in complete sentences without any shortness of breath.  Alert and oriented x3.  Normal judgment. No apparent acute distress. ? ? ? ?Impression and Recommendations:   ? ?Problem List Items Addressed This Visit   ? ?  ? Cardiovascular and Mediastinum  ? Migraines  ?  Discussed optoins. Since she has only had one migraine this  year will start but treating prophylaxis.  Will start with Maxalt Mlt.  Monitor future migraines.  Keep track of how often she is having them if they become more frequent again we can also discuss prophylactic options.  She says weather seems to be a trigger as well as increased stress levels. ? ?  ?  ? Relevant Medications  ? rizatriptan (MAXALT-MLT) 10 MG disintegrating tablet  ? oxyCODONE-acetaminophen (PERCOCET) 10-325 MG tablet  ?  ? Other  ? PTSD (post-traumatic stress disorder)  ? Relevant Orders  ? Ambulatory referral to Behavioral Health  ? Encounter for chronic pain management  ? Depression - Primary  ? Relevant Orders  ? Ambulatory referral to Behavioral Health  ? ?Other Visit Diagnoses   ? ? Mood disorder (HCC)      ? ?  ? ?Depression/PTSD-she asked for referral to see a counselor/therapist here in our building.  Referral placed asked her to give [place] call back if she does not hear from anybody in the next 5 business days. ? ? ?Orders Placed This Encounter  ?Procedures  ? Ambulatory referral to Behavioral Health  ?  Referral Priority:   Routine  ?  Referral Type:   Psychiatric  ?  Referral Reason:   Specialty Services Required  ?  Requested Specialty:   Behavioral Health  ?  Number  of Visits Requested:   1  ? ? ?Meds ordered this encounter  ?Medications  ? rizatriptan (MAXALT-MLT) 10 MG disintegrating tablet  ?  Sig: Take 1 tablet (10 mg total) by mouth as needed for migraine. May repeat in 2 hours if needed  ?  Dispense:  10 tablet  ?  Refill:  2  ? ? ? ?I discussed the assessment and treatment plan with the patient. The patient was provided an opportunity to ask questions and all were answered. The patient agreed with the plan and demonstrated an understanding of the instructions. ?  ?The patient was advised to call back or seek an in-person evaluation if the symptoms worsen or if the condition fails to improve as anticipated. ? ? ?Nani Gasser, MD  ? ?

## 2022-04-04 NOTE — Progress Notes (Signed)
Pt was seen in the ED on 4/2 for migraine. She stated that this was her first in over 1 year.  ? ?She is doing ok at present and states that the headaches are not as bad as it was.  ? ?She has been seen by Neurology in the past and doesn't feel that she needs to go back at this time. She was prescribed baclofen at that time to help with the headaches.  ? ?Pt asked for a referral downstairs to [facility].  ?

## 2022-04-08 DIAGNOSIS — H5213 Myopia, bilateral: Secondary | ICD-10-CM | POA: Diagnosis not present

## 2022-04-10 ENCOUNTER — Encounter: Payer: Self-pay | Admitting: Family Medicine

## 2022-04-11 DIAGNOSIS — Z881 Allergy status to other antibiotic agents status: Secondary | ICD-10-CM | POA: Diagnosis not present

## 2022-04-11 DIAGNOSIS — Z886 Allergy status to analgesic agent status: Secondary | ICD-10-CM | POA: Diagnosis not present

## 2022-04-11 DIAGNOSIS — R0789 Other chest pain: Secondary | ICD-10-CM | POA: Diagnosis not present

## 2022-04-11 DIAGNOSIS — R079 Chest pain, unspecified: Secondary | ICD-10-CM | POA: Diagnosis not present

## 2022-04-11 DIAGNOSIS — Z885 Allergy status to narcotic agent status: Secondary | ICD-10-CM | POA: Diagnosis not present

## 2022-04-11 DIAGNOSIS — J9809 Other diseases of bronchus, not elsewhere classified: Secondary | ICD-10-CM | POA: Diagnosis not present

## 2022-04-11 DIAGNOSIS — Z888 Allergy status to other drugs, medicaments and biological substances status: Secondary | ICD-10-CM | POA: Diagnosis not present

## 2022-04-11 DIAGNOSIS — J984 Other disorders of lung: Secondary | ICD-10-CM | POA: Diagnosis not present

## 2022-04-12 ENCOUNTER — Encounter: Payer: Self-pay | Admitting: Physician Assistant

## 2022-04-12 ENCOUNTER — Telehealth: Payer: Self-pay | Admitting: General Practice

## 2022-04-12 ENCOUNTER — Ambulatory Visit: Payer: Medicaid Other | Admitting: Physician Assistant

## 2022-04-12 VITALS — BP 118/51 | HR 83 | Temp 97.8°F | Ht 63.0 in | Wt 226.0 lb

## 2022-04-12 DIAGNOSIS — R9389 Abnormal findings on diagnostic imaging of other specified body structures: Secondary | ICD-10-CM | POA: Diagnosis not present

## 2022-04-12 DIAGNOSIS — R079 Chest pain, unspecified: Secondary | ICD-10-CM

## 2022-04-12 DIAGNOSIS — J209 Acute bronchitis, unspecified: Secondary | ICD-10-CM | POA: Diagnosis not present

## 2022-04-12 DIAGNOSIS — Z7289 Other problems related to lifestyle: Secondary | ICD-10-CM | POA: Diagnosis not present

## 2022-04-12 MED ORDER — ALBUTEROL SULFATE HFA 108 (90 BASE) MCG/ACT IN AERS: 2.0000 | INHALATION_SPRAY | RESPIRATORY_TRACT | 0 refills | Status: DC | PRN

## 2022-04-12 MED ORDER — AZITHROMYCIN 250 MG PO TABS: ORAL_TABLET | ORAL | 0 refills | Status: DC

## 2022-04-12 MED ORDER — DEXAMETHASONE 4 MG PO TABS: 4.0000 mg | ORAL_TABLET | Freq: Two times a day (BID) | ORAL | 0 refills | Status: DC

## 2022-04-12 NOTE — Patient Instructions (Signed)
Acute Bronchitis, Adult ? ?Acute bronchitis is sudden inflammation of the main airways (bronchi) that come off the windpipe (trachea) in the lungs. The swelling causes the airways to get smaller and make more mucus than normal. This can make it hard to breathe and can cause coughing or noisy breathing (wheezing). ?Acute bronchitis may last several weeks. The cough may last longer. Allergies, asthma, and exposure to smoke may make the condition worse. ?What are the causes? ?This condition can be caused by germs and by substances that irritate the lungs, including: ?Cold and flu viruses. The most common cause of this condition is the virus that causes the common cold. ?Bacteria. This is less common. ?Breathing in substances that irritate the lungs, including: ?Smoke from cigarettes and other forms of tobacco. ?Dust and pollen. ?Fumes from household cleaning products, gases, or burned fuel. ?Indoor or outdoor air pollution. ?What increases the risk? ?The following factors may make you more likely to develop this condition: ?A weak body's defense system, also called the immune system. ?A condition that affects your lungs and breathing, such as asthma. ?What are the signs or symptoms? ?Common symptoms of this condition include: ?Coughing. This may bring up clear, yellow, or green mucus from your lungs (sputum). ?Wheezing. ?Runny or stuffy nose. ?Having too much mucus in your lungs (chest congestion). ?Shortness of breath. ?Aches and pains, including sore throat or chest. ?How is this diagnosed? ?This condition is usually diagnosed based on: ?Your symptoms and medical history. ?A physical exam. ?You may also have other tests, including tests to rule out other conditions, such as pneumonia. These tests include: ?A test of lung function. ?Test of a mucus sample to look for the presence of bacteria. ?Tests to check the oxygen level in your blood. ?Blood tests. ?Chest X-ray. ?How is this treated? ?Most cases of acute  bronchitis clear up over time without treatment. Your health care provider may recommend: ?Drinking more fluids to help thin your mucus so it is easier to cough up. ?Taking inhaled medicine (inhaler) to improve air flow in and out of your lungs. ?Using a vaporizer or a humidifier. These are machines that add water to the air to help you breathe better. ?Taking a medicine that thins mucus and clears congestion (expectorant). ?Taking a medicine that prevents or stops coughing (cough suppressant). ?It is notcommon to take an antibiotic medicine for this condition. ?Follow these instructions at home: ? ?Take over-the-counter and prescription medicines only as told by your health care provider. ?Use an inhaler, vaporizer, or humidifier as told by your health care provider. ?Take two teaspoons (10 mL) of honey at bedtime to lessen coughing at night. ?Drink enough fluid to keep your urine pale yellow. ?Do not use any products that contain nicotine or tobacco. These products include cigarettes, chewing tobacco, and vaping devices, such as e-cigarettes. If you need help quitting, ask your health care provider. ?Get plenty of rest. ?Return to your normal activities as told by your health care provider. Ask your health care provider what activities are safe for you. ?Keep all follow-up visits. This is important. ?How is this prevented? ?To lower your risk of getting this condition again: ?Wash your hands often with soap and water for at least 20 seconds. If soap and water are not available, use hand sanitizer. ?Avoid contact with people who have cold symptoms. ?Try not to touch your mouth, nose, or eyes with your hands. ?Avoid breathing in smoke or chemical fumes. Breathing smoke or chemical fumes will make your   condition worse. ?Get the flu shot every year. ?Contact a health care provider if: ?Your symptoms do not improve after 2 weeks. ?You have trouble coughing up the mucus. ?Your cough keeps you awake at night. ?You have a  fever. ?Get help right away if you: ?Cough up blood. ?Feel pain in your chest. ?Have severe shortness of breath. ?Faint or keep feeling like you are going to faint. ?Have a severe headache. ?Have a fever or chills that get worse. ?These symptoms may represent a serious problem that is an emergency. Do not wait to see if the symptoms will go away. Get medical help right away. Call your local emergency services (911 in the [place] Do not drive yourself to the hospital. ?Summary ?Acute bronchitis is inflammation of the main airways (bronchi) that come off the windpipe (trachea) in the lungs. The swelling causes the airways to get smaller and make more mucus than normal. ?Drinking more fluids can help thin your mucus so it is easier to cough up. ?Take over-the-counter and prescription medicines only as told by your health care provider. ?Do not use any products that contain nicotine or tobacco. These products include cigarettes, chewing tobacco, and vaping devices, such as e-cigarettes. If you need help quitting, ask your health care provider. ?Contact a health care provider if your symptoms do not improve after 2 weeks. ?This information is not intended to replace advice given to you by your health care provider. Make sure you discuss any questions you have with your health care provider. ?Document Revised: 03/28/2021 Document Reviewed: 03/28/2021 ?Elsevier Patient Education ? 2023 Elsevier Inc. ? ?

## 2022-04-12 NOTE — Telephone Encounter (Signed)
Transition Care Management Follow-up Telephone Call ?Date of discharge and from where: 04/11/22 from [facility] ?How have you been since you were released from the hospital? Still having intermittent difficulty breathing and intermittent chest pain but no cold symptoms, fever or chills. No nausea/vomiting. Has an appt with PCP on 04/15/22. Patient has a pulse oximeter at home and is monitoring her symptoms. She was told that it could be pneumonia but was not prescribed any medications at this time and was asked to follow up with PCP. She denies any fever, chills, nausea or vomiting at this time.  ?Any questions or concerns? No ? ?Items Reviewed: ?Did the pt receive and understand the discharge instructions provided? Yes  ?Medications obtained and verified? No  ?Other? No  ?Any new allergies since your discharge? No  ?Dietary orders reviewed? No ?Do you have support at home? Yes  ? ?Home Care and Equipment/Supplies: ?Were home health services ordered? no ? ?Functional Questionnaire: (I = Independent and D = Dependent) ?ADLs: I ? ?Bathing/Dressing- I ? ?Meal Prep- I ? ?Eating- I ? ?Maintaining continence- I ? ?Transferring/Ambulation- I ? ?Managing Meds- I ? ?Follow up appointments reviewed: ? ?PCP Hospital f/u appt confirmed? Yes  Scheduled to see Dr. Linford Arnold on 04/15/22. ?Specialist Hospital f/u appt confirmed? No  ?Are transportation arrangements needed? No  ?If their condition worsens, is the pt aware to call PCP or go to the Emergency Dept.? Yes ?Was the patient provided with contact information for the PCP's office or ED? Yes ?Was to pt encouraged to call back with questions or concerns? Yes  ?

## 2022-04-12 NOTE — Progress Notes (Signed)
? ?Acute Office Visit ? ?Subjective:  ? ?  ?Patient ID: [name], female    DOB: 1991-03-11, 31 y.o.   MRN: [id number] ? ?Chief Complaint  ?Patient presents with  ? Shortness of Breath  ? ? ?HPI ?Patient is in today for hospital follow up and left sided chest pain.  ? ? ?CP started yesterday and woke up her in the middle of the night. Her pulse ox was 92 percent at home. No fever, chills, cough, URI symptoms. Went to ED no signs of ACS. Normal EKG.  CXR was abnormal.  ? ?IMPRESSION: Increased density noted in the left perihilar region with peribronchial cuffing which could represent a developing left perihilar infiltrate Electronically Signed by: [name] on 04/11/2022 12:58 AM ? ? ?Pt does admit to vaping a lot over the past few weeks and wonders if this could cause her symptoms.  ? ?. ?Active Ambulatory Problems  ?  Diagnosis Date Noted  ? Left-sided chest pain 07/07/2016  ? Depression   ? PTSD (post-traumatic stress disorder)   ? Numbness on right side 07/07/2016  ? 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 Tobacco abuse   ? Nausea with vomiting 07/08/2016  ? TIA (transient ischemic attack) 03/17/2017  ? Right sided weakness 03/17/2017  ? Calf pain   ? 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 Dysphagia   ? Paresthesia   ? Conversion disorder   ? Noncompliance   ? Clotting disorder (HCC)   ? Gastroesophageal reflux disease without esophagitis 12/26/2021  ? History of iron deficiency 12/26/2021  ? COVID 07/2020  ? Constipation 01/08/2022  ? Asthma 01/08/2022  ? Abnormal uterine and vaginal bleeding, unspecified 04/17/2017  ? 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 Anticoagulation management encounter 02/10/2014  ? Adnexal fullness 04/17/2017  ? History of DVT of lower extremity, 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  ? Abnormal vaginal bleeding 08/05/2018  ? Pelvic pain in female 08/11/2018  ? PCOS (polycystic ovarian syndrome) 07/20/2014  ? Palpitations 05/04/2013  ?  Nicotine addiction 03/13/2018  ? Migraines 07/20/2014  ? Lower abdominal pain 04/17/2017  ? History of PCOS 03/23/2013  ? Gallstones 09/24/2013  ? DVT (deep venous thrombosis) (HCC) 11/29/2013  ? Thrombocytosis 12/06/2014  ? Right leg weakness 10/14/2016  ? Encounter for chronic pain management 04/04/2022  ? Current every day vaping 04/12/2022  ? ?Resolved Ambulatory Problems  ?  Diagnosis Date Noted  ? Atypical chest pain   ? UTI (urinary tract infection) 03/17/2017  ? Dog bite 10/10/2015  ? Postcoital bleeding 02/16/2020  ? ?Past Medical History:  ?Diagnosis Date  ? H/O iron deficiency anemia   ? PFO (patent foramen ovale) 2016  ? Right atrial thrombus 2018  ? Thrombosis of right atrium 2014  ? ? ? ?ROS ?See HPI.  ? ?   ?Objective:  ?  ?BP (!) 118/51   Pulse 83   Temp 97.8 ?F (36.6 ?C) (Oral)   Ht 5\' 3"  (1.6 m)   Wt 226 lb (102.5 kg)   SpO2 99%   BMI 40.03 kg/m?  ?BP Readings from Last 3 Encounters:  ?04/12/22 (!) 118/51  ?01/14/22 116/67  ?01/09/22 110/78  ? ?  ? ?Physical Exam ?Vitals reviewed.  ?Constitutional:   ?   Appearance: Normal appearance. She is obese.  ?HENT:  ?   Head: Normocephalic.  ?Cardiovascular:  ?   Rate and Rhythm: Normal rate and regular rhythm.  ?   Pulses: Normal pulses.  ?  Heart sounds: Normal heart sounds.  ?Pulmonary:  ?   Effort: Pulmonary effort is normal.  ?   Breath sounds: Normal breath sounds.  ?Lymphadenopathy:  ?   Cervical: No cervical adenopathy.  ?Neurological:  ?   General: No focal deficit present.  ?   Mental Status: She is alert and oriented to person, place, and time.  ?Psychiatric:     ?   Mood and Affect: Mood normal.  ? ? ? ? ?   ?Assessment & Plan:  ?..[name] was seen today for shortness of breath. ? ?Diagnoses and all orders for this visit: ? ?Left-sided chest pain ?-     azithromycin (ZITHROMAX) 250 MG tablet; 2 tabs po x1 on Day 1, then 1 tab po daily on Days 2 - 5 ?-     dexamethasone (DECADRON) 4 MG tablet; Take 1 tablet (4 mg total) by mouth 2 (two)  times daily with a meal. ?-     albuterol (VENTOLIN HFA) 108 (90 Base) MCG/ACT inhaler; Inhale 2 puffs into the lungs every 4 (four) hours as needed. ?-     DG Chest 2 View; Future ? ?Abnormal CXR ?-     azithromycin (ZITHROMAX) 250 MG tablet; 2 tabs po x1 on Day 1, then 1 tab po daily on Days 2 - 5 ?-     dexamethasone (DECADRON) 4 MG tablet; Take 1 tablet (4 mg total) by mouth 2 (two) times daily with a meal. ?-     albuterol (VENTOLIN HFA) 108 (90 Base) MCG/ACT inhaler; Inhale 2 puffs into the lungs every 4 (four) hours as needed. ?-     DG Chest 2 View; Future ? ?Acute bronchitis, unspecified organism ?-     azithromycin (ZITHROMAX) 250 MG tablet; 2 tabs po x1 on Day 1, then 1 tab po daily on Days 2 - 5 ?-     dexamethasone (DECADRON) 4 MG tablet; Take 1 tablet (4 mg total) by mouth 2 (two) times daily with a meal. ?-     albuterol (VENTOLIN HFA) 108 (90 Base) MCG/ACT inhaler; Inhale 2 puffs into the lungs every 4 (four) hours as needed. ?-     DG Chest 2 View; Future ? ?Current every day vaping ?-     dexamethasone (DECADRON) 4 MG tablet; Take 1 tablet (4 mg total) by mouth 2 (two) times daily with a meal. ?-     albuterol (VENTOLIN HFA) 108 (90 Base) MCG/ACT inhaler; Inhale 2 puffs into the lungs every 4 (four) hours as needed. ?-     DG Chest 2 View; Future ? ? ?? If excessive vaping did cause the acute bronchitis and possible pneumonia ?Vitals look great ?Reviewed hospital labs and testing ?On eliquis for anti-coagulation ?Stop vaping, pt agrees to stop ?Start decadron and zpak ?Albuterol every 4-6 hours ?Recheck CXR in 2-3 weeks for resolution ?Follow up as needed or if symptoms worsen ? ? ?Return if symptoms worsen or fail to improve. ? ?Barbara Gaw, PA-C ? ? ?

## 2022-04-15 ENCOUNTER — Inpatient Hospital Stay: Payer: Medicaid Other | Admitting: Family Medicine

## 2022-04-15 DIAGNOSIS — Z6841 Body Mass Index (BMI) 40.0 and over, adult: Secondary | ICD-10-CM | POA: Diagnosis not present

## 2022-04-15 DIAGNOSIS — M545 Low back pain, unspecified: Secondary | ICD-10-CM | POA: Diagnosis not present

## 2022-04-15 DIAGNOSIS — N9489 Other specified conditions associated with female genital organs and menstrual cycle: Secondary | ICD-10-CM | POA: Diagnosis not present

## 2022-04-15 DIAGNOSIS — Z79899 Other long term (current) drug therapy: Secondary | ICD-10-CM | POA: Diagnosis not present

## 2022-04-15 DIAGNOSIS — D689 Coagulation defect, unspecified: Secondary | ICD-10-CM | POA: Diagnosis not present

## 2022-04-15 DIAGNOSIS — F17210 Nicotine dependence, cigarettes, uncomplicated: Secondary | ICD-10-CM | POA: Diagnosis not present

## 2022-04-15 DIAGNOSIS — N809 Endometriosis, unspecified: Secondary | ICD-10-CM | POA: Diagnosis not present

## 2022-04-15 NOTE — Progress Notes (Unsigned)
[name], since she saw her would you mind writing her work note.  I am not sure how much time you feel she needs. ?

## 2022-04-18 ENCOUNTER — Encounter: Payer: Self-pay | Admitting: Family Medicine

## 2022-04-18 DIAGNOSIS — Z888 Allergy status to other drugs, medicaments and biological substances status: Secondary | ICD-10-CM | POA: Diagnosis not present

## 2022-04-18 DIAGNOSIS — Z886 Allergy status to analgesic agent status: Secondary | ICD-10-CM | POA: Diagnosis not present

## 2022-04-18 DIAGNOSIS — F17210 Nicotine dependence, cigarettes, uncomplicated: Secondary | ICD-10-CM | POA: Diagnosis not present

## 2022-04-18 DIAGNOSIS — R079 Chest pain, unspecified: Secondary | ICD-10-CM | POA: Diagnosis not present

## 2022-04-18 DIAGNOSIS — R0789 Other chest pain: Secondary | ICD-10-CM | POA: Diagnosis not present

## 2022-04-18 DIAGNOSIS — R0602 Shortness of breath: Secondary | ICD-10-CM | POA: Diagnosis not present

## 2022-04-18 DIAGNOSIS — Z7901 Long term (current) use of anticoagulants: Secondary | ICD-10-CM | POA: Diagnosis not present

## 2022-04-18 DIAGNOSIS — Z79899 Other long term (current) drug therapy: Secondary | ICD-10-CM | POA: Diagnosis not present

## 2022-04-18 DIAGNOSIS — Z885 Allergy status to narcotic agent status: Secondary | ICD-10-CM | POA: Diagnosis not present

## 2022-04-22 ENCOUNTER — Telehealth: Payer: Self-pay | Admitting: General Practice

## 2022-04-22 NOTE — Telephone Encounter (Signed)
Transition Care Management Unsuccessful Follow-up Telephone Call ? ?Date of discharge and from where:  04/18/22 From [facility] ? ?Attempts:  1st Attempt ? ?Reason for unsuccessful TCM follow-up call:  Left voice message ? ?  ?

## 2022-04-23 NOTE — Telephone Encounter (Signed)
Transition Care Management Unsuccessful Follow-up Telephone Call ? ?Date of discharge and from where:  04/18/22 from [facility] ? ?Attempts:  2nd Attempt ? ?Reason for unsuccessful TCM follow-up call:  Left voice message ? ?  ?

## 2022-04-24 NOTE — Telephone Encounter (Signed)
Transition Care Management Unsuccessful Follow-up Telephone Call ? ?Date of discharge and from where:  04/18/22 from [facility] ? ?Attempts:  3rd Attempt ? ?Reason for unsuccessful TCM follow-up call:  Left voice message ? ?  ?

## 2022-05-01 ENCOUNTER — Ambulatory Visit (HOSPITAL_BASED_OUTPATIENT_CLINIC_OR_DEPARTMENT_OTHER)
Admission: RE | Admit: 2022-05-01 | Discharge: 2022-05-01 | Disposition: A | Discharge status: Home / Self Care | Payer: Medicaid Other | Source: Ambulatory Visit | Attending: Cardiology | Admitting: Cardiology

## 2022-05-01 DIAGNOSIS — R0609 Other forms of dyspnea: Secondary | ICD-10-CM

## 2022-05-01 DIAGNOSIS — D689 Coagulation defect, unspecified: Secondary | ICD-10-CM | POA: Insufficient documentation

## 2022-05-01 DIAGNOSIS — G459 Transient cerebral ischemic attack, unspecified: Secondary | ICD-10-CM | POA: Insufficient documentation

## 2022-05-01 DIAGNOSIS — Z720 Tobacco use: Secondary | ICD-10-CM | POA: Diagnosis not present

## 2022-05-01 DIAGNOSIS — R0789 Other chest pain: Secondary | ICD-10-CM | POA: Insufficient documentation

## 2022-05-01 LAB — ECHOCARDIOGRAM COMPLETE
AR max vel: 2.29 cm2
AV Area VTI: 2.42 cm2
AV Area mean vel: 2.25 cm2
AV Mean grad: 4 mmHg
AV Peak grad: 6.9 mmHg
Ao pk vel: 1.31 m/s
Area-P 1/2: 3.37 cm2
S' Lateral: 3.1 cm

## 2022-05-01 NOTE — Progress Notes (Signed)
  Echocardiogram 2D Echocardiogram has been performed.  Roosvelt Maser F 05/01/2022, 5:10 PM

## 2022-05-03 DIAGNOSIS — N9489 Other specified conditions associated with female genital organs and menstrual cycle: Secondary | ICD-10-CM | POA: Diagnosis not present

## 2022-05-03 DIAGNOSIS — F17210 Nicotine dependence, cigarettes, uncomplicated: Secondary | ICD-10-CM | POA: Diagnosis not present

## 2022-05-03 DIAGNOSIS — Z79899 Other long term (current) drug therapy: Secondary | ICD-10-CM | POA: Diagnosis not present

## 2022-05-03 DIAGNOSIS — D689 Coagulation defect, unspecified: Secondary | ICD-10-CM | POA: Diagnosis not present

## 2022-05-03 DIAGNOSIS — Z6841 Body Mass Index (BMI) 40.0 and over, adult: Secondary | ICD-10-CM | POA: Diagnosis not present

## 2022-05-03 DIAGNOSIS — M545 Low back pain, unspecified: Secondary | ICD-10-CM | POA: Diagnosis not present

## 2022-05-03 DIAGNOSIS — N809 Endometriosis, unspecified: Secondary | ICD-10-CM | POA: Diagnosis not present

## 2022-05-05 ENCOUNTER — Emergency Department (HOSPITAL_COMMUNITY)
Admission: EM | Admit: 2022-05-05 | Discharge: 2022-05-05 | Disposition: A | Discharge status: Home / Self Care | Payer: Medicaid Other | Attending: Emergency Medicine | Admitting: Emergency Medicine

## 2022-05-05 ENCOUNTER — Encounter (HOSPITAL_COMMUNITY): Payer: Self-pay | Admitting: Emergency Medicine

## 2022-05-05 ENCOUNTER — Other Ambulatory Visit: Payer: Self-pay

## 2022-05-05 ENCOUNTER — Emergency Department (HOSPITAL_COMMUNITY): Payer: Medicaid Other

## 2022-05-05 DIAGNOSIS — R079 Chest pain, unspecified: Secondary | ICD-10-CM

## 2022-05-05 DIAGNOSIS — N9489 Other specified conditions associated with female genital organs and menstrual cycle: Secondary | ICD-10-CM | POA: Insufficient documentation

## 2022-05-05 DIAGNOSIS — R0789 Other chest pain: Secondary | ICD-10-CM | POA: Diagnosis not present

## 2022-05-05 DIAGNOSIS — R072 Precordial pain: Secondary | ICD-10-CM | POA: Diagnosis not present

## 2022-05-05 DIAGNOSIS — Z7901 Long term (current) use of anticoagulants: Secondary | ICD-10-CM | POA: Insufficient documentation

## 2022-05-05 LAB — BASIC METABOLIC PANEL
Anion gap: 7 (ref 5–15)
BUN: 6 mg/dL (ref 6–20)
CO2: 25 mmol/L (ref 22–32)
Calcium: 8.7 mg/dL — ABNORMAL LOW (ref 8.9–10.3)
Chloride: 104 mmol/L (ref 98–111)
Creatinine, Ser: 0.92 mg/dL (ref 0.44–1.00)
GFR, Estimated: >60 mL/min (ref >60)
Glucose, Bld: 118 mg/dL — ABNORMAL HIGH (ref 70–99)
Potassium: 3.5 mmol/L (ref 3.5–5.1)
Sodium: 136 mmol/L (ref 135–145)

## 2022-05-05 LAB — CBC
HCT: 40.5 % (ref 36.0–46.0)
Hemoglobin: 13.5 g/dL (ref 12.0–15.0)
MCH: 29.4 pg (ref 26.0–34.0)
MCHC: 33.3 g/dL (ref 30.0–36.0)
MCV: 88.2 fL (ref 80.0–100.0)
Platelets: 386 10*3/uL (ref 150–400)
RBC: 4.59 MIL/uL (ref 3.87–5.11)
RDW: 12.3 % (ref 11.5–15.5)
WBC: 6.2 10*3/uL (ref 4.0–10.5)
nRBC: 0 % (ref 0.0–0.2)

## 2022-05-05 LAB — TROPONIN I (HIGH SENSITIVITY): Troponin I (High Sensitivity): 3 ng/L (ref <18)

## 2022-05-05 LAB — I-STAT BETA HCG BLOOD, ED (MC, WL, AP ONLY): I-stat hCG, quantitative: <5 m[IU]/mL (ref <5)

## 2022-05-05 NOTE — ED Provider Notes (Signed)
[facility]   CSN: [id number] Arrival date & time: 05/05/22  1409     History  Chief Complaint  Patient presents with   Chest Pain    Veneda MelterKrizia Mirta Gaillard is a 31 y.o. female.  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  Patient states she was at work, had just moved the patient.  When she sat down at the desk she had an episode of substernal chest pain lasted for about 45 minutes.  She took an antacid and this improved it.  She states it slightly feels like her GERD but was more severe and more sudden onset.  Currently she is chest pain-free.  Denies any shortness of breath.  Recently had a CT PE study and echo both of which showed no clot or thrombus.  States has been compliant with Eliquis.  No swelling of her lower extremities.  No recent traveling or change in medication.  She otherwise feels well.  Home Medications Prior to Admission medications   Medication Sig Start Date End Date Taking? Authorizing Provider  albuterol (VENTOLIN HFA) 108 (90 Base) MCG/ACT inhaler Inhale 2 puffs into the lungs every 4 (four) hours as needed. 04/12/22   Breeback, Lonna CobbJade L, PA-C  apixaban (ELIQUIS) 5 MG TABS tablet Take 1 tablet (5 mg total) by mouth 2 (two) times daily. 03/26/17   Richarda OverlieAbrol, Nayana, MD  azithromycin (ZITHROMAX) 250 MG tablet 2 tabs po x1 on Day 1, then 1 tab po daily on Days 2 - 5 04/12/22   Breeback, Jade L, PA-C  dexamethasone (DECADRON) 4 MG tablet Take 1 tablet (4 mg total) by mouth 2 (two) times daily with a meal. 04/12/22   Breeback, Jade L, PA-C  naloxone (NARCAN) nasal spray 4 mg/0.1 mL SMARTSIG:Both Nares 11/26/21   [provider]  ondansetron (ZOFRAN-ODT) 4 MG disintegrating tablet Take 4 mg by mouth 2 (two) times daily as needed. 03/05/22   [provider]   oxyCODONE-acetaminophen (PERCOCET) 10-325 MG tablet Take 1 tablet by mouth in the morning and at bedtime.    [provider]  rizatriptan (MAXALT-MLT) 10 MG disintegrating tablet Take 1 tablet (10 mg total) by mouth as needed for migraine. May repeat in 2 hours if needed 04/04/22   Agapito GamesMetheney, Catherine D, MD      Allergies    Fluoxetine, Tramadol, Ceftriaxone, Gabapentin, Levaquin [levofloxacin], Morphine and related, Nsaids, Topamax [topiramate], Toradol [ketorolac tromethamine], Vicodin [hydrocodone-acetaminophen], Depakote [divalproex sodium], Methocarbamol, and Metoclopramide    Review of Systems   Review of Systems  Constitutional:  Negative for chills and fever.  Respiratory:  Negative for shortness of breath.   Cardiovascular:  Positive for chest pain. Negative for palpitations and leg swelling.  Gastrointestinal:  Negative for abdominal pain, diarrhea and vomiting.  Musculoskeletal:  Negative for back pain.  Skin:  Negative for rash.  Neurological:  Negative for headaches.   Physical Exam Updated Vital Signs BP 135/71 (BP Location: Right Arm)   Pulse 86   Temp 98.4 F (36.9 C) (Oral)   Resp 16   LMP 05/01/2022   SpO2 98%  Physical Exam Vitals and nursing note reviewed.  Constitutional:      General: She is not in acute distress.    Appearance: Normal appearance. She is not ill-appearing or diaphoretic.  HENT:     Head: Normocephalic.  Mouth/Throat:     Mouth: Mucous membranes are moist.  Cardiovascular:     Rate and Rhythm: Normal rate.     Heart sounds: Heart sounds not distant. No murmur heard. Pulmonary:     Effort: Pulmonary effort is normal. No respiratory distress.     Breath sounds: No decreased breath sounds.  Abdominal:     Palpations: Abdomen is soft.     Tenderness: There is no abdominal tenderness.  Musculoskeletal:     Right lower leg: No edema.     Left lower leg: No edema.  Skin:    General: Skin is warm.  Neurological:     Mental  Status: She is alert and oriented to person, place, and time. Mental status is at baseline.  Psychiatric:        Mood and Affect: Mood normal.    ED Results / Procedures / Treatments   Labs (all labs ordered are listed, but only abnormal results are displayed) Labs Reviewed  BASIC METABOLIC PANEL - Abnormal; Notable for the following components:      Result Value   Glucose, Bld 118 (*)    Calcium 8.7 (*)    All other components within normal limits  CBC  I-STAT BETA HCG BLOOD, ED ([facility], [facility], [facility] ONLY)  TROPONIN I (HIGH SENSITIVITY)  TROPONIN I (HIGH SENSITIVITY)    EKG EKG Interpretation  Date/Time:  Sunday May 05 2022 14:18:10 EDT Ventricular Rate:  82 PR Interval:  154 QRS Duration: 76 QT Interval:  376 QTC Calculation: 439 R Axis:   67 Text Interpretation: Normal sinus rhythm Nonspecific T wave abnormality Abnormal ECG When compared with ECG of 14-Jan-2022 11:34, PREVIOUS ECG IS PRESENT Confirmed by Coralee Pesa [phone]) on 05/05/2022 3:08:34 PM  Radiology DG Chest 2 View  Result Date: 05/05/2022 CLINICAL DATA:  Pain in chest radiating down abdomen and left leg for a couple of hours. EXAM: CHEST - 2 VIEW COMPARISON:  Chest two views 01/14/2022 FINDINGS: Status post median sternotomy. Cardiac silhouette and mediastinal contours are within normal limits. The lungs are clear. No pleural effusion or pneumothorax. No acute skeletal abnormality. Cholecystectomy clips. IMPRESSION: No active cardiopulmonary disease. Electronically Signed   By: Neita Garnet M.D.   On: 05/05/2022 14:58    Procedures Procedures    Medications Ordered in ED Medications - No data to display  ED Course/ Medical Decision Making/ A&P                           Medical Decision Making Amount and/or Complexity of Data Reviewed Labs: ordered. Radiology: ordered.   31 year old female presents emergency department after resolved episode of chest pain.  She is currently chest pain-free.  Vitals are  normal.  She has been compliant with her Eliquis.  Recently had a CT PE study on 5/11 and an echo done 4 days ago both of which showed no PE/thrombus.  She denies any shortness of breath, cough, hemoptysis, swelling of her lower extremities.  EKG shows no acute ischemic changes.  Blood work is normal, troponin is negative.  Chest x-ray shows no acute findings.  Very low suspicion for ACS at this time.  Low suspicion for PE given her compliance with Eliquis and recent negative studies.  Do not feel she needs an emergent CT PE study at this time.  No signs of DVT on exam.  She feels back to baseline, her impression is that this was related to her GERD just  more severe.  Request to be discharged.  Patient at this time appears safe and stable for discharge and close outpatient follow up. Discharge plan and strict return to ED precautions discussed, patient verbalizes understanding and agreement.        Final Clinical Impression(s) / ED Diagnoses Final diagnoses:  Chest pain, unspecified type    Rx / DC Orders ED Discharge Orders     None         Rozelle Logan, DO 05/05/22 1639

## 2022-05-05 NOTE — Discharge Instructions (Addendum)
You have been seen and discharged from the emergency department.  Your EKG, blood work and heart enzyme were normal.  Chest x-ray showed no new finding.  Follow-up with your primary provider for further evaluation and further care. Take home medications as prescribed. If you have any worsening symptoms or further concerns for your health please return to an emergency department for further evaluation.

## 2022-05-05 NOTE — ED Triage Notes (Signed)
Pt reports pain to center of chest x 1 hour with nausea, L jaw tingling, and soreness to L leg.  Pain started while at work.  States she took an oxycodone and pain decreased to 5/10.  Denies vomiting and SOB.

## 2022-05-07 ENCOUNTER — Telehealth: Payer: Self-pay | Admitting: General Practice

## 2022-05-07 NOTE — Telephone Encounter (Signed)
Transition Care Management Unsuccessful Follow-up Telephone Call  Date of discharge and from where:  05/05/22 from [facility]  Attempts:  1st Attempt  Reason for unsuccessful TCM follow-up call:  Left voice message

## 2022-05-08 NOTE — Telephone Encounter (Signed)
Transition Care Management Unsuccessful Follow-up Telephone Call  Date of discharge and from where:  05/05/22 from [facility]  Attempts:  2nd Attempt  Reason for unsuccessful TCM follow-up call:  Left voice message

## 2022-05-13 NOTE — Telephone Encounter (Signed)
Transition Care Management Unsuccessful Follow-up Telephone Call  Date of discharge and from where:  05/05/22 from [facility]  Attempts:  3rd Attempt  Reason for unsuccessful TCM follow-up call:  Left voice message

## 2022-05-22 DIAGNOSIS — Z23 Encounter for immunization: Secondary | ICD-10-CM | POA: Diagnosis not present

## 2022-05-22 DIAGNOSIS — Z79899 Other long term (current) drug therapy: Secondary | ICD-10-CM | POA: Diagnosis not present

## 2022-05-22 DIAGNOSIS — D689 Coagulation defect, unspecified: Secondary | ICD-10-CM | POA: Diagnosis not present

## 2022-05-22 DIAGNOSIS — M545 Low back pain, unspecified: Secondary | ICD-10-CM | POA: Diagnosis not present

## 2022-05-22 DIAGNOSIS — Z6841 Body Mass Index (BMI) 40.0 and over, adult: Secondary | ICD-10-CM | POA: Diagnosis not present

## 2022-05-22 DIAGNOSIS — N809 Endometriosis, unspecified: Secondary | ICD-10-CM | POA: Diagnosis not present

## 2022-05-22 DIAGNOSIS — N9489 Other specified conditions associated with female genital organs and menstrual cycle: Secondary | ICD-10-CM | POA: Diagnosis not present

## 2022-05-22 DIAGNOSIS — F17210 Nicotine dependence, cigarettes, uncomplicated: Secondary | ICD-10-CM | POA: Diagnosis not present

## 2022-05-22 DIAGNOSIS — Z0184 Encounter for antibody response examination: Secondary | ICD-10-CM | POA: Diagnosis not present

## 2022-05-31 DIAGNOSIS — Z23 Encounter for immunization: Secondary | ICD-10-CM | POA: Diagnosis not present

## 2022-06-05 ENCOUNTER — Ambulatory Visit: Payer: Medicaid Other | Admitting: Cardiology

## 2022-06-08 DIAGNOSIS — Z885 Allergy status to narcotic agent status: Secondary | ICD-10-CM | POA: Diagnosis not present

## 2022-06-08 DIAGNOSIS — F17210 Nicotine dependence, cigarettes, uncomplicated: Secondary | ICD-10-CM | POA: Diagnosis not present

## 2022-06-08 DIAGNOSIS — R079 Chest pain, unspecified: Secondary | ICD-10-CM | POA: Diagnosis not present

## 2022-06-08 DIAGNOSIS — Z7901 Long term (current) use of anticoagulants: Secondary | ICD-10-CM | POA: Diagnosis not present

## 2022-06-08 DIAGNOSIS — R110 Nausea: Secondary | ICD-10-CM | POA: Diagnosis not present

## 2022-06-08 DIAGNOSIS — R0789 Other chest pain: Secondary | ICD-10-CM | POA: Diagnosis not present

## 2022-06-08 DIAGNOSIS — Z86718 Personal history of other venous thrombosis and embolism: Secondary | ICD-10-CM | POA: Diagnosis not present

## 2022-06-10 ENCOUNTER — Ambulatory Visit (HOSPITAL_COMMUNITY): Payer: Medicaid Other | Admitting: Licensed Clinical Social Worker

## 2022-06-10 ENCOUNTER — Telehealth: Payer: Self-pay | Admitting: General Practice

## 2022-06-10 NOTE — Telephone Encounter (Signed)
Transition Care Management Follow-up Telephone Call Date of discharge and from where: 06/08/22 from [facility] How have you been since you were released from the hospital? Doing better. Denies any chest pain. Any questions or concerns? No  Items Reviewed: Did the pt receive and understand the discharge instructions provided? Yes  Medications obtained and verified? Yes  Other? No  Any new allergies since your discharge? No  Dietary orders reviewed? Yes Do you have support at home? Yes   Home Care and Equipment/Supplies: Were home health services ordered? no  Functional Questionnaire: (I = Independent and D = Dependent) ADLs: I  Bathing/Dressing- I  Meal Prep- I  Eating- I  Maintaining continence- I  Transferring/Ambulation- I  Managing Meds- I  Follow up appointments reviewed:  PCP Hospital f/u appt confirmed? Yes  Scheduled to see PCP on 06/13/22 @ 930. Specialist Hospital f/u appt confirmed? No   Are transportation arrangements needed? No  If their condition worsens, is the pt aware to call PCP or go to the Emergency Dept.? Yes Was the patient provided with contact information for the PCP's office or ED? Yes Was to pt encouraged to call back with questions or concerns? Yes

## 2022-06-13 ENCOUNTER — Ambulatory Visit: Payer: Medicaid Other | Admitting: Family Medicine

## 2022-06-13 DIAGNOSIS — D689 Coagulation defect, unspecified: Secondary | ICD-10-CM | POA: Diagnosis not present

## 2022-06-13 DIAGNOSIS — Z7901 Long term (current) use of anticoagulants: Secondary | ICD-10-CM | POA: Diagnosis not present

## 2022-06-13 DIAGNOSIS — R110 Nausea: Secondary | ICD-10-CM | POA: Diagnosis not present

## 2022-06-13 DIAGNOSIS — L0390 Cellulitis, unspecified: Secondary | ICD-10-CM | POA: Diagnosis not present

## 2022-06-13 DIAGNOSIS — N809 Endometriosis, unspecified: Secondary | ICD-10-CM | POA: Diagnosis not present

## 2022-06-13 DIAGNOSIS — N9489 Other specified conditions associated with female genital organs and menstrual cycle: Secondary | ICD-10-CM | POA: Diagnosis not present

## 2022-06-13 DIAGNOSIS — Z79899 Other long term (current) drug therapy: Secondary | ICD-10-CM | POA: Diagnosis not present

## 2022-06-13 DIAGNOSIS — Z6841 Body Mass Index (BMI) 40.0 and over, adult: Secondary | ICD-10-CM | POA: Diagnosis not present

## 2022-06-13 DIAGNOSIS — M545 Low back pain, unspecified: Secondary | ICD-10-CM | POA: Diagnosis not present

## 2022-06-13 DIAGNOSIS — R12 Heartburn: Secondary | ICD-10-CM | POA: Diagnosis not present

## 2022-06-13 DIAGNOSIS — M25511 Pain in right shoulder: Secondary | ICD-10-CM | POA: Diagnosis not present

## 2022-06-13 DIAGNOSIS — F17210 Nicotine dependence, cigarettes, uncomplicated: Secondary | ICD-10-CM | POA: Diagnosis not present

## 2022-06-14 DIAGNOSIS — Z881 Allergy status to other antibiotic agents status: Secondary | ICD-10-CM | POA: Diagnosis not present

## 2022-06-14 DIAGNOSIS — L03114 Cellulitis of left upper limb: Secondary | ICD-10-CM | POA: Diagnosis not present

## 2022-06-14 DIAGNOSIS — Z951 Presence of aortocoronary bypass graft: Secondary | ICD-10-CM | POA: Diagnosis not present

## 2022-06-14 DIAGNOSIS — Z888 Allergy status to other drugs, medicaments and biological substances status: Secondary | ICD-10-CM | POA: Diagnosis not present

## 2022-06-14 DIAGNOSIS — Z886 Allergy status to analgesic agent status: Secondary | ICD-10-CM | POA: Diagnosis not present

## 2022-06-14 DIAGNOSIS — F17210 Nicotine dependence, cigarettes, uncomplicated: Secondary | ICD-10-CM | POA: Diagnosis not present

## 2022-06-14 DIAGNOSIS — R0789 Other chest pain: Secondary | ICD-10-CM | POA: Diagnosis not present

## 2022-06-14 DIAGNOSIS — Z8673 Personal history of transient ischemic attack (TIA), and cerebral infarction without residual deficits: Secondary | ICD-10-CM | POA: Diagnosis not present

## 2022-06-14 DIAGNOSIS — Z86718 Personal history of other venous thrombosis and embolism: Secondary | ICD-10-CM | POA: Diagnosis not present

## 2022-06-14 DIAGNOSIS — Z885 Allergy status to narcotic agent status: Secondary | ICD-10-CM | POA: Diagnosis not present

## 2022-06-14 DIAGNOSIS — Z7901 Long term (current) use of anticoagulants: Secondary | ICD-10-CM | POA: Diagnosis not present

## 2022-06-14 DIAGNOSIS — F17290 Nicotine dependence, other tobacco product, uncomplicated: Secondary | ICD-10-CM | POA: Diagnosis not present

## 2022-06-15 DIAGNOSIS — L03114 Cellulitis of left upper limb: Secondary | ICD-10-CM | POA: Diagnosis not present

## 2022-06-15 DIAGNOSIS — R0789 Other chest pain: Secondary | ICD-10-CM | POA: Diagnosis not present

## 2022-06-16 DIAGNOSIS — Z79899 Other long term (current) drug therapy: Secondary | ICD-10-CM | POA: Diagnosis not present

## 2022-06-17 ENCOUNTER — Telehealth: Payer: Self-pay | Admitting: General Practice

## 2022-06-17 NOTE — Telephone Encounter (Signed)
Transition Care Management Unsuccessful Follow-up Telephone Call  Date of discharge and from where:  06/14/22 from [facility]  Attempts:  1st Attempt  Reason for unsuccessful TCM follow-up call:  Left voice message

## 2022-06-18 NOTE — Telephone Encounter (Signed)
Transition Care Management Unsuccessful Follow-up Telephone Call  Date of discharge and from where:  06/14/22 from [facility]  Attempts:  2nd Attempt  Reason for unsuccessful TCM follow-up call:  Left voice message

## 2022-06-19 NOTE — Telephone Encounter (Signed)
Transition Care Management Unsuccessful Follow-up Telephone Call  Date of discharge and from where:  06/14/22 from [facility]  Attempts:  3rd Attempt  Reason for unsuccessful TCM follow-up call:  Left voice message

## 2022-06-28 ENCOUNTER — Telehealth (INDEPENDENT_AMBULATORY_CARE_PROVIDER_SITE_OTHER): Payer: Medicaid Other | Admitting: Physician Assistant

## 2022-06-28 ENCOUNTER — Encounter: Payer: Self-pay | Admitting: Physician Assistant

## 2022-06-28 VITALS — BP 120/76 | HR 81 | Ht 63.0 in | Wt 223.0 lb

## 2022-06-28 DIAGNOSIS — R1013 Epigastric pain: Secondary | ICD-10-CM | POA: Diagnosis not present

## 2022-06-28 DIAGNOSIS — R142 Eructation: Secondary | ICD-10-CM | POA: Diagnosis not present

## 2022-06-28 DIAGNOSIS — R140 Abdominal distension (gaseous): Secondary | ICD-10-CM | POA: Diagnosis not present

## 2022-06-28 DIAGNOSIS — K5900 Constipation, unspecified: Secondary | ICD-10-CM | POA: Diagnosis not present

## 2022-06-28 DIAGNOSIS — Z8639 Personal history of other endocrine, nutritional and metabolic disease: Secondary | ICD-10-CM

## 2022-06-28 DIAGNOSIS — R079 Chest pain, unspecified: Secondary | ICD-10-CM | POA: Diagnosis not present

## 2022-06-28 MED ORDER — FAMOTIDINE 40 MG PO TABS: ORAL_TABLET | ORAL | 2 refills | Status: DC

## 2022-06-28 NOTE — Progress Notes (Unsigned)
Having reflux, trapped gas, bloating Taking Mylanta x 1 week, helping  Seeing therapist starting next month, having issues with hypochondria due to past heart issues  No gallbladder oily stool   Get labs Start pepcid  Follow up in 3weeks

## 2022-06-29 NOTE — Progress Notes (Signed)
..Virtual Visit via Telephone Note  I connected with [name] on 06/28/2022 at  1:20 PM EDT by telephone and verified that I am speaking with the correct person using two identifiers.  Location: Patient: home Provider: clinic  .Marland KitchenParticipating in visit:  Patient: [name] Provider: Tandy Gaw PA-C   I discussed the limitations, risks, security and privacy concerns of performing an evaluation and management service by telephone and the availability of in person appointments. I also discussed with the patient that there may be a patient responsible charge related to this service. The patient expressed understanding and agreed to proceed.   History of Present Illness: Pt is a 31 yo female who is c/o acid reflux, burping, epigastric discomfort, bloating for over a week. She had symptoms so bad Sunday night that she started to have chest pain and almost went to ED. Her anxiety at times with run away with her. Mylanta is helping. Hx of cholecystectomy. She is anxious to start medication for this. No changes in diet or medications. Stool is oily but denies any melena or hematochezia.   Marland Kitchen. 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   Dysphagia    Paresthesia    Conversion disorder    Noncompliance    Clotting disorder (HCC)    Gastroesophageal reflux disease without esophagitis 12/26/2021   History of iron deficiency 12/26/2021   COVID 07/2020   Constipation 01/08/2022   Asthma 01/08/2022   Abnormal uterine and vaginal bleeding, unspecified 04/17/2017   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   Anticoagulation management encounter 02/10/2014   Adnexal fullness 04/17/2017   History of DVT of lower extremity, 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   Abnormal vaginal bleeding 08/05/2018   Pelvic pain in female 08/11/2018   PCOS (polycystic ovarian syndrome) 07/20/2014   Palpitations 05/04/2013   Nicotine addiction 03/13/2018   Migraines 07/20/2014   Lower abdominal pain 04/17/2017   History of PCOS 03/23/2013   Gallstones 09/24/2013   DVT (deep venous thrombosis) (HCC) 11/29/2013   Thrombocytosis 12/06/2014   Right leg weakness 10/14/2016   Encounter for chronic pain management 04/04/2022   Current every day vaping 04/12/2022   Bloating 06/28/2022   Epigastric abdominal pain 06/28/2022   Burping 06/28/2022   Resolved Ambulatory Problems    Diagnosis Date Noted   Atypical chest pain    UTI (urinary tract infection) 03/17/2017   Dog bite 10/10/2015   Postcoital bleeding 02/16/2020   Past Medical History:  Diagnosis Date   H/O iron deficiency anemia    PFO (patent foramen ovale) 2016   Right atrial thrombus 2018   Thrombosis of right atrium 2014    Observations/Objective: No acute distress Normal mood  Normal breathing  .Marland Kitchen Today's Vitals   06/28/22 1316  BP: 120/76  Pulse: 81  Weight: 223 lb (101.2 kg)  Height: 5\' 3"  (1.6 m)   Body mass index is 39.5 kg/m.    Assessment and Plan: Marland Kitchen was seen today for gastroesophageal reflux.  Diagnoses and all orders for this visit:  Bloating -     famotidine (PEPCID) 40  MG tablet; Take one tablet before dinner. -     CBC with Differential/Platelet -     Lipase -     COMPLETE METABOLIC PANEL WITH GFR -     TSH -     Fe+TIBC+Fer  Chest pain, unspecified type -     famotidine (PEPCID) 40 MG tablet; Take one tablet before dinner. -     CBC with Differential/Platelet -     Lipase -     COMPLETE METABOLIC PANEL WITH GFR -     TSH -     Fe+TIBC+Fer  Epigastric abdominal pain -     famotidine (PEPCID) 40 MG  tablet; Take one tablet before dinner. -     CBC with Differential/Platelet -     Lipase -     COMPLETE METABOLIC PANEL WITH GFR -     TSH -     Fe+TIBC+Fer  Burping -     famotidine (PEPCID) 40 MG tablet; Take one tablet before dinner. -     CBC with Differential/Platelet -     Lipase -     COMPLETE METABOLIC PANEL WITH GFR -     TSH -     Fe+TIBC+Fer  Constipation, unspecified constipation type  History of iron deficiency -     CBC with Differential/Platelet -     Fe+TIBC+Fer   Symptoms sound classic for GERD. Start pepcid bid for the next 3 weeks. If no improvement consider PPI.  Discussed GERD diet.  Ok to use mylanta prn.  Will get cbc/lipase/cmp Follow up 3 weeks.  Discussed red flags to follow up sooner.    Follow Up Instructions:    I discussed the assessment and treatment plan with the patient. The patient was provided an opportunity to ask questions and all were answered. The patient agreed with the plan and demonstrated an understanding of the instructions.   The patient was advised to call back or seek an in-person evaluation if the symptoms worsen or if the condition fails to improve as anticipated.  I provided 11 minutes of non-face-to-face time during this encounter.   Tandy Gaw, PA-C

## 2022-07-01 DIAGNOSIS — R1013 Epigastric pain: Secondary | ICD-10-CM | POA: Diagnosis not present

## 2022-07-01 DIAGNOSIS — Z8639 Personal history of other endocrine, nutritional and metabolic disease: Secondary | ICD-10-CM | POA: Diagnosis not present

## 2022-07-01 DIAGNOSIS — R079 Chest pain, unspecified: Secondary | ICD-10-CM | POA: Diagnosis not present

## 2022-07-01 DIAGNOSIS — R142 Eructation: Secondary | ICD-10-CM | POA: Diagnosis not present

## 2022-07-01 DIAGNOSIS — R140 Abdominal distension (gaseous): Secondary | ICD-10-CM | POA: Diagnosis not present

## 2022-07-02 ENCOUNTER — Encounter: Payer: Self-pay | Admitting: Family Medicine

## 2022-07-02 DIAGNOSIS — R110 Nausea: Secondary | ICD-10-CM | POA: Diagnosis not present

## 2022-07-02 DIAGNOSIS — M545 Low back pain, unspecified: Secondary | ICD-10-CM | POA: Diagnosis not present

## 2022-07-02 DIAGNOSIS — R102 Pelvic and perineal pain: Secondary | ICD-10-CM | POA: Diagnosis not present

## 2022-07-02 DIAGNOSIS — N9489 Other specified conditions associated with female genital organs and menstrual cycle: Secondary | ICD-10-CM | POA: Diagnosis not present

## 2022-07-02 DIAGNOSIS — Z6841 Body Mass Index (BMI) 40.0 and over, adult: Secondary | ICD-10-CM | POA: Diagnosis not present

## 2022-07-02 DIAGNOSIS — E559 Vitamin D deficiency, unspecified: Secondary | ICD-10-CM | POA: Diagnosis not present

## 2022-07-02 DIAGNOSIS — D689 Coagulation defect, unspecified: Secondary | ICD-10-CM | POA: Diagnosis not present

## 2022-07-02 DIAGNOSIS — N809 Endometriosis, unspecified: Secondary | ICD-10-CM | POA: Diagnosis not present

## 2022-07-02 DIAGNOSIS — M25511 Pain in right shoulder: Secondary | ICD-10-CM | POA: Diagnosis not present

## 2022-07-02 DIAGNOSIS — F17210 Nicotine dependence, cigarettes, uncomplicated: Secondary | ICD-10-CM | POA: Diagnosis not present

## 2022-07-02 DIAGNOSIS — Z0000 Encounter for general adult medical examination without abnormal findings: Secondary | ICD-10-CM | POA: Diagnosis not present

## 2022-07-02 DIAGNOSIS — R12 Heartburn: Secondary | ICD-10-CM | POA: Diagnosis not present

## 2022-07-02 DIAGNOSIS — Z79899 Other long term (current) drug therapy: Secondary | ICD-10-CM | POA: Diagnosis not present

## 2022-07-02 LAB — CBC WITH DIFFERENTIAL/PLATELET
Absolute Monocytes: 378 cells/uL (ref 200–950)
Basophils Absolute: 32 cells/uL (ref 0–200)
Basophils Relative: 0.5 %
Eosinophils Absolute: 58 cells/uL (ref 15–500)
Eosinophils Relative: 0.9 %
HCT: 42.8 % (ref 35.0–45.0)
Hemoglobin: 14.4 g/dL (ref 11.7–15.5)
Lymphs Abs: 1882 cells/uL (ref 850–3900)
MCH: 28.9 pg (ref 27.0–33.0)
MCHC: 33.6 g/dL (ref 32.0–36.0)
MCV: 85.9 fL (ref 80.0–100.0)
MPV: 9.6 fL (ref 7.5–12.5)
Monocytes Relative: 5.9 %
Neutro Abs: 4051 cells/uL (ref 1500–7800)
Neutrophils Relative %: 63.3 %
Platelets: 415 10*3/uL — ABNORMAL HIGH (ref 140–400)
RBC: 4.98 10*6/uL (ref 3.80–5.10)
RDW: 12 % (ref 11.0–15.0)
Total Lymphocyte: 29.4 %
WBC: 6.4 10*3/uL (ref 3.8–10.8)

## 2022-07-02 LAB — COMPLETE METABOLIC PANEL WITH GFR
AG Ratio: 1.5 (calc) (ref 1.0–2.5)
ALT: 7 U/L (ref 6–29)
AST: 12 U/L (ref 10–30)
Albumin: 4.3 g/dL (ref 3.6–5.1)
Alkaline phosphatase (APISO): 54 U/L (ref 31–125)
BUN: 11 mg/dL (ref 7–25)
CO2: 26 mmol/L (ref 20–32)
Calcium: 9.4 mg/dL (ref 8.6–10.2)
Chloride: 103 mmol/L (ref 98–110)
Creat: 0.84 mg/dL (ref 0.50–0.97)
Globulin: 2.8 g/dL (calc) (ref 1.9–3.7)
Glucose, Bld: 105 mg/dL — ABNORMAL HIGH (ref 65–99)
Potassium: 4.4 mmol/L (ref 3.5–5.3)
Sodium: 137 mmol/L (ref 135–146)
Total Bilirubin: 0.5 mg/dL (ref 0.2–1.2)
Total Protein: 7.1 g/dL (ref 6.1–8.1)
eGFR: 95 mL/min/{1.73_m2} (ref >=60)

## 2022-07-02 LAB — IRON,TIBC AND FERRITIN PANEL
%SAT: 22 % (calc) (ref 16–45)
Ferritin: 10 ng/mL — ABNORMAL LOW (ref 16–154)
Iron: 100 ug/dL (ref 40–190)
TIBC: 447 mcg/dL (calc) (ref 250–450)

## 2022-07-02 LAB — LIPASE: Lipase: 11 U/L (ref 7–60)

## 2022-07-02 LAB — TSH: TSH: 1.31 mIU/L

## 2022-07-02 NOTE — Progress Notes (Signed)
Platelets are slightly elevated similar to what they have been in the past year.  Calcium is normal and metabolic panel looks good.  Iron stores are low.  Recommend starting an over-the-counter daily iron supplement.  Sometimes iron can be constipating so you may want to use a stool softener with it.  Plan to recheck iron panel in about 8 to 12 weeks.  No inflammation of the pancreas.  Thyroid is normal.

## 2022-07-04 ENCOUNTER — Ambulatory Visit: Payer: Medicaid Other | Admitting: Obstetrics and Gynecology

## 2022-07-05 DIAGNOSIS — Z79899 Other long term (current) drug therapy: Secondary | ICD-10-CM | POA: Diagnosis not present

## 2022-07-16 NOTE — Progress Notes (Deleted)
GYNECOLOGY OFFICE VISIT NOTE  History:   [name] is a 31 y.o. G2P2 here today for pelvic pain and vaginal irritation, possibly yeast infection in setting of long standing pelvic pain that is worse during her cycle but also during other times. She is managed by a pain specialist for this. She has had a tubal ligation.   She is on blood thinners due to significant clotting risk - she had emergency open heart surgery for a blood clot in her right atrium. She also has had TIAs.  She has had other indicated surgery since being on blood thinners (gall bladder). She was placed on a taper.   She has had two vaginal deliveries tested to 6lb11oz. She has no history of fibroids.     Past Medical History:  Diagnosis Date   Asthma    Clotting disorder (HCC)    per 01/09/22 OV note from Dr. Bing Matter, pt has some coagulopathy. However extensive hematological work-up has been unrevealing.   Constipation    COVID 07/2020   COVID 11/29/2021   mild symptoms, cough, sore throat & HA   Depression    Dog bite 10/10/2015   H/O iron deficiency anemia    PFO (patent foramen ovale) 2016   PTSD (post-traumatic stress disorder)    Right atrial thrombus 2018   per 03/19/2017 TEE in Epic   Thrombosis of right atrium 2014   surgical removal   TIA (transient ischemic attack) 2018   right-sided weakness   Tobacco abuse    UTI (urinary tract infection)     Past Surgical History:  Procedure Laterality Date   btl     CARDIAC SURGERY  2014   thrombectomy to remove thrombi in right atrium   CHOLECYSTECTOMY     TEE WITHOUT CARDIOVERSION N/A 07/09/2016   Procedure: TRANSESOPHAGEAL ECHOCARDIOGRAM (TEE);  Surgeon: Orpah Cobb, MD;  Location: [facility] ENDOSCOPY;  Service: Cardiovascular;  Laterality: N/A;   TEE WITHOUT CARDIOVERSION N/A 03/19/2017   Procedure: TRANSESOPHAGEAL ECHOCARDIOGRAM (TEE);  Surgeon: Orpah Cobb, MD;  Location: [facility] ENDOSCOPY;  Service: Cardiovascular;  Laterality: N/A;    The  following portions of the patient's history were reviewed and updated as appropriate: allergies, current medications, past family history, past medical history, past social history, past surgical history and problem list.   Health Maintenance:   Normal pap on 07/2019 (CE)  Review of Systems:  Pertinent items noted in HPI and remainder of comprehensive ROS otherwise negative.  Physical Exam:  There were no vitals taken for this visit. CONSTITUTIONAL: Well-developed, well-nourished female in no acute distress.  HEENT:  Normocephalic, atraumatic. External right and left ear normal. No scleral icterus.  NECK: Normal range of motion, supple, no masses noted on observation SKIN: No rash noted. Not diaphoretic. No erythema. No pallor. MUSCULOSKELETAL: Normal range of motion. No edema noted. NEUROLOGIC: Alert and oriented to person, place, and time. Normal muscle tone coordination. No cranial nerve deficit noted. PSYCHIATRIC: Normal mood and affect. Normal behavior. Normal judgment and thought content.  CARDIOVASCULAR: Normal heart rate noted RESPIRATORY: Effort and breath sounds normal, no problems with respiration noted ABDOMEN: No masses noted. No other overt distention noted.    PELVIC: {Blank single:19197::"Deferred","Normal appearing external genitalia; normal urethral meatus; normal appearing vaginal mucosa and cervix.  No abnormal discharge noted.  Normal uterine size, no other palpable masses, no uterine or adnexal tenderness. Performed in the presence of a chaperone"}  Labs and Imaging No results found for this or any previous visit (from the  past 168 hour(s)). No results found.  Assessment and Plan:   1. Pelvic pain in female Discussed management options remain the same as before - can do ablation but I think hysterectomy in her case would be superior. With ablation it will be unlikely to help her pain and she is going to be at high risk for need for a hysterectomy later due to  bleeding resuming due to her young age. 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    ***  2. Vaginal irritation Cultures done  3. Abnormal vaginal bleeding Check pap with HPV today since due    Diagnoses and all orders for this visit:  Pelvic pain in female  Vaginal irritation  Abnormal vaginal bleeding    Routine preventative health maintenance measures emphasized. Please refer to After Visit Summary for other counseling recommendations.   No follow-ups on file.  Milas Hock, MD, FACOG Obstetrician & Gynecologist, [facility] for [facility], [facility]

## 2022-07-18 ENCOUNTER — Ambulatory Visit: Payer: Medicaid Other | Admitting: Obstetrics and Gynecology

## 2022-07-18 DIAGNOSIS — M25511 Pain in right shoulder: Secondary | ICD-10-CM | POA: Diagnosis not present

## 2022-07-18 DIAGNOSIS — N898 Other specified noninflammatory disorders of vagina: Secondary | ICD-10-CM

## 2022-07-18 DIAGNOSIS — M545 Low back pain, unspecified: Secondary | ICD-10-CM | POA: Diagnosis not present

## 2022-07-18 DIAGNOSIS — R12 Heartburn: Secondary | ICD-10-CM | POA: Diagnosis not present

## 2022-07-18 DIAGNOSIS — R110 Nausea: Secondary | ICD-10-CM | POA: Diagnosis not present

## 2022-07-18 DIAGNOSIS — E7800 Pure hypercholesterolemia, unspecified: Secondary | ICD-10-CM | POA: Diagnosis not present

## 2022-07-18 DIAGNOSIS — R102 Pelvic and perineal pain: Secondary | ICD-10-CM

## 2022-07-18 DIAGNOSIS — N9489 Other specified conditions associated with female genital organs and menstrual cycle: Secondary | ICD-10-CM | POA: Diagnosis not present

## 2022-07-18 DIAGNOSIS — F17210 Nicotine dependence, cigarettes, uncomplicated: Secondary | ICD-10-CM | POA: Diagnosis not present

## 2022-07-18 DIAGNOSIS — Z6841 Body Mass Index (BMI) 40.0 and over, adult: Secondary | ICD-10-CM | POA: Diagnosis not present

## 2022-07-18 DIAGNOSIS — N939 Abnormal uterine and vaginal bleeding, unspecified: Secondary | ICD-10-CM

## 2022-07-18 DIAGNOSIS — N809 Endometriosis, unspecified: Secondary | ICD-10-CM | POA: Diagnosis not present

## 2022-07-18 DIAGNOSIS — D689 Coagulation defect, unspecified: Secondary | ICD-10-CM | POA: Diagnosis not present

## 2022-07-18 DIAGNOSIS — Z79899 Other long term (current) drug therapy: Secondary | ICD-10-CM | POA: Diagnosis not present

## 2022-07-18 DIAGNOSIS — E559 Vitamin D deficiency, unspecified: Secondary | ICD-10-CM | POA: Diagnosis not present

## 2022-07-22 DIAGNOSIS — Z79899 Other long term (current) drug therapy: Secondary | ICD-10-CM | POA: Diagnosis not present

## 2022-07-30 ENCOUNTER — Telehealth (HOSPITAL_COMMUNITY): Payer: Self-pay | Admitting: Licensed Clinical Social Worker

## 2022-07-30 ENCOUNTER — Ambulatory Visit (INDEPENDENT_AMBULATORY_CARE_PROVIDER_SITE_OTHER): Payer: Self-pay | Admitting: Licensed Clinical Social Worker

## 2022-07-30 DIAGNOSIS — Z91199 Patient's noncompliance with other medical treatment and regimen due to unspecified reason: Secondary | ICD-10-CM

## 2022-07-30 NOTE — Telephone Encounter (Signed)
LCSW counselor attempted to connect with patient for scheduled appointment via MyChart video text request x 2 and email request with no response; also attempted to connect via phone without success.   Attempt 1: Text and email: 2:03p  Attempt 2: Text and email:2:07p  Attempt 3: phone call:  2:15p (could not leave message)  Attempted another phone call: pt picked up phone and stated that she had to work today--wants to reschedule to 10/9@10 virtual and be placed on a call list   Visit coded as no show  

## 2022-07-30 NOTE — Progress Notes (Signed)
LCSW counselor attempted to connect with patient for scheduled appointment via MyChart video text request x 2 and email request with no response; also attempted to connect via phone without success.   Attempt 1: Text and email: 2:03p  Attempt 2: Text and email:2:07p  Attempt 3: phone call:  2:15p (could not leave message)  Attempted another phone call: pt picked up phone and stated that she had to work today--wants to reschedule to 10/9@10  virtual and be placed on a call list   Visit coded as no show

## 2022-08-06 ENCOUNTER — Telehealth: Payer: Self-pay | Admitting: Family Medicine

## 2022-08-06 DIAGNOSIS — R790 Abnormal level of blood mineral: Secondary | ICD-10-CM

## 2022-08-06 NOTE — Telephone Encounter (Signed)
Orders Placed This Encounter  Procedures   Fe+TIBC+Fer    

## 2022-08-07 DIAGNOSIS — M545 Low back pain, unspecified: Secondary | ICD-10-CM | POA: Diagnosis not present

## 2022-08-07 DIAGNOSIS — F17210 Nicotine dependence, cigarettes, uncomplicated: Secondary | ICD-10-CM | POA: Diagnosis not present

## 2022-08-07 DIAGNOSIS — N9489 Other specified conditions associated with female genital organs and menstrual cycle: Secondary | ICD-10-CM | POA: Diagnosis not present

## 2022-08-07 DIAGNOSIS — Z79899 Other long term (current) drug therapy: Secondary | ICD-10-CM | POA: Diagnosis not present

## 2022-08-07 DIAGNOSIS — Z7901 Long term (current) use of anticoagulants: Secondary | ICD-10-CM | POA: Diagnosis not present

## 2022-08-07 DIAGNOSIS — R12 Heartburn: Secondary | ICD-10-CM | POA: Diagnosis not present

## 2022-08-07 DIAGNOSIS — R102 Pelvic and perineal pain: Secondary | ICD-10-CM | POA: Diagnosis not present

## 2022-08-07 DIAGNOSIS — E7800 Pure hypercholesterolemia, unspecified: Secondary | ICD-10-CM | POA: Diagnosis not present

## 2022-08-07 DIAGNOSIS — E559 Vitamin D deficiency, unspecified: Secondary | ICD-10-CM | POA: Diagnosis not present

## 2022-08-07 DIAGNOSIS — R110 Nausea: Secondary | ICD-10-CM | POA: Diagnosis not present

## 2022-08-07 DIAGNOSIS — N809 Endometriosis, unspecified: Secondary | ICD-10-CM | POA: Diagnosis not present

## 2022-08-07 DIAGNOSIS — D689 Coagulation defect, unspecified: Secondary | ICD-10-CM | POA: Diagnosis not present

## 2022-08-07 DIAGNOSIS — Z6841 Body Mass Index (BMI) 40.0 and over, adult: Secondary | ICD-10-CM | POA: Diagnosis not present

## 2022-08-07 DIAGNOSIS — R790 Abnormal level of blood mineral: Secondary | ICD-10-CM | POA: Diagnosis not present

## 2022-08-08 ENCOUNTER — Ambulatory Visit: Payer: Medicaid Other | Admitting: Obstetrics & Gynecology

## 2022-08-08 LAB — IRON,TIBC AND FERRITIN PANEL
%SAT: 18 % (calc) (ref 16–45)
Ferritin: 10 ng/mL — ABNORMAL LOW (ref 16–154)
Iron: 79 ug/dL (ref 40–190)
TIBC: 435 mcg/dL (calc) (ref 250–450)

## 2022-08-08 NOTE — Progress Notes (Signed)
Iron stores are still low.  They are about what they were a month ago.  See if you can increase the medication to twice a day?  If it is causing constipation you can always go back down to once a day.  Try to eat more iron rich foods.  We can always send you a list if you would like or if you want to go on to the [employer] website there is a great list of iron rich foods as well.

## 2022-08-15 ENCOUNTER — Ambulatory Visit (HOSPITAL_COMMUNITY): Payer: Medicaid Other | Admitting: Licensed Clinical Social Worker

## 2022-08-22 DIAGNOSIS — N9489 Other specified conditions associated with female genital organs and menstrual cycle: Secondary | ICD-10-CM | POA: Diagnosis not present

## 2022-08-22 DIAGNOSIS — R12 Heartburn: Secondary | ICD-10-CM | POA: Diagnosis not present

## 2022-08-22 DIAGNOSIS — Z79899 Other long term (current) drug therapy: Secondary | ICD-10-CM | POA: Diagnosis not present

## 2022-08-22 DIAGNOSIS — D689 Coagulation defect, unspecified: Secondary | ICD-10-CM | POA: Diagnosis not present

## 2022-08-22 DIAGNOSIS — Z6841 Body Mass Index (BMI) 40.0 and over, adult: Secondary | ICD-10-CM | POA: Diagnosis not present

## 2022-08-22 DIAGNOSIS — R102 Pelvic and perineal pain: Secondary | ICD-10-CM | POA: Diagnosis not present

## 2022-08-22 DIAGNOSIS — E7800 Pure hypercholesterolemia, unspecified: Secondary | ICD-10-CM | POA: Diagnosis not present

## 2022-08-22 DIAGNOSIS — N809 Endometriosis, unspecified: Secondary | ICD-10-CM | POA: Diagnosis not present

## 2022-08-22 DIAGNOSIS — F17210 Nicotine dependence, cigarettes, uncomplicated: Secondary | ICD-10-CM | POA: Diagnosis not present

## 2022-08-22 DIAGNOSIS — R059 Cough, unspecified: Secondary | ICD-10-CM | POA: Diagnosis not present

## 2022-08-22 DIAGNOSIS — M545 Low back pain, unspecified: Secondary | ICD-10-CM | POA: Diagnosis not present

## 2022-08-22 DIAGNOSIS — J329 Chronic sinusitis, unspecified: Secondary | ICD-10-CM | POA: Diagnosis not present

## 2022-08-22 DIAGNOSIS — Z20822 Contact with and (suspected) exposure to covid-19: Secondary | ICD-10-CM | POA: Diagnosis not present

## 2022-08-26 DIAGNOSIS — Z79899 Other long term (current) drug therapy: Secondary | ICD-10-CM | POA: Diagnosis not present

## 2022-09-04 ENCOUNTER — Telehealth: Payer: Self-pay | Admitting: Cardiology

## 2022-09-04 ENCOUNTER — Telehealth: Payer: Self-pay | Admitting: General Practice

## 2022-09-04 DIAGNOSIS — R072 Precordial pain: Secondary | ICD-10-CM | POA: Diagnosis not present

## 2022-09-04 DIAGNOSIS — R0602 Shortness of breath: Secondary | ICD-10-CM | POA: Diagnosis not present

## 2022-09-04 DIAGNOSIS — R61 Generalized hyperhidrosis: Secondary | ICD-10-CM | POA: Diagnosis not present

## 2022-09-04 DIAGNOSIS — R0789 Other chest pain: Secondary | ICD-10-CM | POA: Diagnosis not present

## 2022-09-04 DIAGNOSIS — I517 Cardiomegaly: Secondary | ICD-10-CM | POA: Diagnosis not present

## 2022-09-04 DIAGNOSIS — J986 Disorders of diaphragm: Secondary | ICD-10-CM | POA: Diagnosis not present

## 2022-09-04 DIAGNOSIS — R079 Chest pain, unspecified: Secondary | ICD-10-CM | POA: Diagnosis not present

## 2022-09-04 NOTE — Telephone Encounter (Signed)
Transition Care Management Follow-up Telephone Call Date of discharge and from where: 09/04/22 from [facility] How have you been since you were released from the hospital? Patient is doing ok but has been having panic attacks lately.  Any questions or concerns? No  Items Reviewed: Did the pt receive and understand the discharge instructions provided? Yes  Medications obtained and verified? No  Other? No  Any new allergies since your discharge? No  Dietary orders reviewed? Yes Do you have support at home? Yes   Home Care and Equipment/Supplies: Were home health services ordered? no  Functional Questionnaire: (I = Independent and D = Dependent) ADLs: I  Bathing/Dressing- I  Meal Prep- I  Eating- I  Maintaining continence- I  Transferring/Ambulation- I  Managing Meds- I  Follow up appointments reviewed:  PCP Hospital f/u appt confirmed? Yes. Scheduled with PCP on 09/17/22 at 3:10pm [facility] f/u appt confirmed? Yes  Scheduled to see the cardiologist on 11/04/22. Are transportation arrangements needed? No  If their condition worsens, is the pt aware to call PCP or go to the Emergency Dept.? Yes Was the patient provided with contact information for the PCP's office or ED? Yes Was to pt encouraged to call back with questions or concerns? Yes

## 2022-09-04 NOTE — Telephone Encounter (Signed)
Spoke with pt who states that she was in the ED at [facility] and her heart is enlarged. Pt request an earlier appointment. I sent her xray to your email. Please advise.

## 2022-09-04 NOTE — Telephone Encounter (Signed)
Patient was seen at the ED and advised to follow-up with Dr. Agustin Cree due to enlarged heart. She scheduled follow-up for 11/29 and would like to know if she can be worked in for a sooner appointment. Please advise.

## 2022-09-07 DIAGNOSIS — N809 Endometriosis, unspecified: Secondary | ICD-10-CM | POA: Diagnosis not present

## 2022-09-07 DIAGNOSIS — D689 Coagulation defect, unspecified: Secondary | ICD-10-CM | POA: Diagnosis not present

## 2022-09-07 DIAGNOSIS — Z79899 Other long term (current) drug therapy: Secondary | ICD-10-CM | POA: Diagnosis not present

## 2022-09-07 DIAGNOSIS — M542 Cervicalgia: Secondary | ICD-10-CM | POA: Diagnosis not present

## 2022-09-07 DIAGNOSIS — Z6841 Body Mass Index (BMI) 40.0 and over, adult: Secondary | ICD-10-CM | POA: Diagnosis not present

## 2022-09-07 DIAGNOSIS — F17210 Nicotine dependence, cigarettes, uncomplicated: Secondary | ICD-10-CM | POA: Diagnosis not present

## 2022-09-07 DIAGNOSIS — R12 Heartburn: Secondary | ICD-10-CM | POA: Diagnosis not present

## 2022-09-07 DIAGNOSIS — N9489 Other specified conditions associated with female genital organs and menstrual cycle: Secondary | ICD-10-CM | POA: Diagnosis not present

## 2022-09-07 DIAGNOSIS — R102 Pelvic and perineal pain: Secondary | ICD-10-CM | POA: Diagnosis not present

## 2022-09-07 DIAGNOSIS — E7800 Pure hypercholesterolemia, unspecified: Secondary | ICD-10-CM | POA: Diagnosis not present

## 2022-09-07 DIAGNOSIS — M545 Low back pain, unspecified: Secondary | ICD-10-CM | POA: Diagnosis not present

## 2022-09-10 ENCOUNTER — Encounter: Payer: Self-pay | Admitting: Family Medicine

## 2022-09-16 ENCOUNTER — Ambulatory Visit (HOSPITAL_COMMUNITY): Payer: Medicaid Other | Admitting: Licensed Clinical Social Worker

## 2022-09-17 ENCOUNTER — Ambulatory Visit: Payer: Medicaid Other | Admitting: Family Medicine

## 2022-09-17 ENCOUNTER — Telehealth: Payer: Self-pay | Admitting: Family Medicine

## 2022-09-17 VITALS — BP 129/79 | HR 85 | Ht 63.0 in | Wt 237.0 lb

## 2022-09-17 DIAGNOSIS — N898 Other specified noninflammatory disorders of vagina: Secondary | ICD-10-CM | POA: Diagnosis not present

## 2022-09-17 DIAGNOSIS — E785 Hyperlipidemia, unspecified: Secondary | ICD-10-CM

## 2022-09-17 DIAGNOSIS — M542 Cervicalgia: Secondary | ICD-10-CM

## 2022-09-17 MED ORDER — ATORVASTATIN CALCIUM 10 MG PO TABS: 10.0000 mg | ORAL_TABLET | Freq: Every day | ORAL | 3 refills | Status: DC

## 2022-09-17 NOTE — Patient Instructions (Signed)
Call back if needed I placed a referral to physical therapy for your neck.

## 2022-09-17 NOTE — Progress Notes (Signed)
Established Patient Office Visit  Subjective   Patient ID: [name], female    DOB: Feb 19, 1991  Age: 31 y.o. MRN: [id number]  Chief Complaint  Patient presents with   Neck Pain    headaches   Vaginal Discharge    HPI Been dealing with some neck pain for probably most a month.  1 night she was horsing around with one of her kids and felt a pop.  Its been gradually getting worse since then.  It is very painful especially in the mornings.  It is also painful with flexion.  She says she is planning on seeing a chiropractor tomorrow she did have x-rays done at [facility].  She also reports some vaginal discharge. She recently broke up with her long term partner.    Also seen in the emergency department for chest pain on July 27.  Chest x-ray from September 27 shows slightly enlarged cardiac silhouette with elevation of the right hemidiaphragm.    ROS    Objective:     BP 129/79   Pulse 85   Ht 5\' 3"  (1.6 m)   Wt 237 lb (107.5 kg)   SpO2 98%   BMI 41.98 kg/m    Physical Exam Vitals and nursing note reviewed.  Constitutional:      Appearance: She is well-developed.  HENT:     Head: Normocephalic and atraumatic.  Pulmonary:     Effort: Pulmonary effort is normal.  Musculoskeletal:     Comments: Pain with flexion but normal range of motion with flexion and extension.  Pain with rotation to the right and pain with side bending to the right.  She has some palpable knots in the muscle on the right upper cervical area.  Skin:    General: Skin is warm and dry.  Neurological:     Mental Status: She is alert and oriented to person, place, and time.  Psychiatric:        Behavior: Behavior normal.      No results found for any visits on 09/17/22.    The ASCVD Risk score (Arnett DK, et al., 2019) failed to calculate for the following reasons:   The 2019 ASCVD risk score is only valid for ages 6 to 62    Assessment & Plan:   Problem List Items  Addressed This Visit   None Visit Diagnoses     Vaginal discharge    -  Primary   Relevant Orders   WET PREP FOR [place], YEAST, CLUE   C. trachomatis/N. gonorrhoeae RNA   Neck pain       Hyperlipidemia, unspecified hyperlipidemia type       Relevant Medications   atorvastatin (LIPITOR) 10 MG tablet      Vaginal discharge-wet prep performed we will call with results once available.  Also with recent break-up of long-term partner we decided to do gonorrhea and Chlamydia testing as well.  Denies do sure swab if symptoms persist and everything looks negative.  Cervical pain from muscle tension and strain-I think working with a chiropractor is reasonable.  Also given a handout to do some home stretches and exercises on her own.  She was given a small quantity of pain medication to use as needed as well she has been told to avoid NSAIDs.  Muscle relaxers have not worked great for her in the past.  They tend to cause side effects.  We did discuss that if she is not improving over the next couple of weeks  then we will do a formal physical therapy referral as well.  Enlarged cardiac silhouette -she did have an echocardiogram in May of this year.  No sign of cardiomegaly on echocardiogram and EF was 60 to 65%.   No follow-ups on file.    Beatrice Lecher, MD

## 2022-09-17 NOTE — Telephone Encounter (Signed)
MyChart note sent

## 2022-09-18 DIAGNOSIS — M9901 Segmental and somatic dysfunction of cervical region: Secondary | ICD-10-CM | POA: Diagnosis not present

## 2022-09-18 DIAGNOSIS — M5413 Radiculopathy, cervicothoracic region: Secondary | ICD-10-CM | POA: Diagnosis not present

## 2022-09-18 DIAGNOSIS — M9903 Segmental and somatic dysfunction of lumbar region: Secondary | ICD-10-CM | POA: Diagnosis not present

## 2022-09-18 DIAGNOSIS — M9904 Segmental and somatic dysfunction of sacral region: Secondary | ICD-10-CM | POA: Diagnosis not present

## 2022-09-18 DIAGNOSIS — M9902 Segmental and somatic dysfunction of thoracic region: Secondary | ICD-10-CM | POA: Diagnosis not present

## 2022-09-18 LAB — WET PREP FOR TRICH, YEAST, CLUE
MICRO NUMBER:: 14032584
Specimen Quality: ADEQUATE

## 2022-09-18 NOTE — Progress Notes (Signed)
Wet prep is normal. Waiting on urine test.

## 2022-09-19 LAB — C. TRACHOMATIS/N. GONORRHOEAE RNA
C. trachomatis RNA, TMA: NOT DETECTED
N. gonorrhoeae RNA, TMA: NOT DETECTED

## 2022-09-20 NOTE — Progress Notes (Signed)
Negative for gonorrhea and chlamydia

## 2022-09-21 DIAGNOSIS — F17210 Nicotine dependence, cigarettes, uncomplicated: Secondary | ICD-10-CM | POA: Diagnosis not present

## 2022-09-21 DIAGNOSIS — E7800 Pure hypercholesterolemia, unspecified: Secondary | ICD-10-CM | POA: Diagnosis not present

## 2022-09-21 DIAGNOSIS — M545 Low back pain, unspecified: Secondary | ICD-10-CM | POA: Diagnosis not present

## 2022-09-21 DIAGNOSIS — M542 Cervicalgia: Secondary | ICD-10-CM | POA: Diagnosis not present

## 2022-09-21 DIAGNOSIS — N9489 Other specified conditions associated with female genital organs and menstrual cycle: Secondary | ICD-10-CM | POA: Diagnosis not present

## 2022-09-21 DIAGNOSIS — R102 Pelvic and perineal pain: Secondary | ICD-10-CM | POA: Diagnosis not present

## 2022-09-21 DIAGNOSIS — D689 Coagulation defect, unspecified: Secondary | ICD-10-CM | POA: Diagnosis not present

## 2022-09-21 DIAGNOSIS — N809 Endometriosis, unspecified: Secondary | ICD-10-CM | POA: Diagnosis not present

## 2022-09-21 DIAGNOSIS — Z6841 Body Mass Index (BMI) 40.0 and over, adult: Secondary | ICD-10-CM | POA: Diagnosis not present

## 2022-09-21 DIAGNOSIS — R12 Heartburn: Secondary | ICD-10-CM | POA: Diagnosis not present

## 2022-09-24 ENCOUNTER — Telehealth (HOSPITAL_COMMUNITY): Payer: Self-pay | Admitting: Licensed Clinical Social Worker

## 2022-09-24 ENCOUNTER — Ambulatory Visit (INDEPENDENT_AMBULATORY_CARE_PROVIDER_SITE_OTHER): Payer: Medicaid Other | Admitting: Licensed Clinical Social Worker

## 2022-09-24 DIAGNOSIS — Z91199 Patient's noncompliance with other medical treatment and regimen due to unspecified reason: Secondary | ICD-10-CM

## 2022-09-24 NOTE — Telephone Encounter (Addendum)
LCSW counselor attempted to connect with patient for scheduled appointment via MyChart video text request x 2 and email request with no response; also attempted to connect via phone without success.   Attempt 1: Text and email: 1:05p  Attempt 2: Text and email: 1:10p  Attempt 3: phone call: 1:15p--phone was answered/hung up on x 2.  Attempted another phone call--pt answered and stated she thought today was Monday and not Tuesday and had to go to work so cannot complete appointment.  Visit will be coded as no show  

## 2022-09-24 NOTE — Progress Notes (Addendum)
LCSW counselor attempted to connect with patient for scheduled appointment via MyChart video text request x 2 and email request with no response; also attempted to connect via phone without success.   Attempt 1: Text and email: 1:05p  Attempt 2: Text and email: 1:10p  Attempt 3: phone call: 1:15p--phone was answered/hung up on x 2.  Attempted another phone call--pt answered and stated she thought today was Monday and not Tuesday and had to go to work so cannot complete appointment.  Visit will be coded as no show

## 2022-09-25 DIAGNOSIS — M9902 Segmental and somatic dysfunction of thoracic region: Secondary | ICD-10-CM | POA: Diagnosis not present

## 2022-09-25 DIAGNOSIS — M9904 Segmental and somatic dysfunction of sacral region: Secondary | ICD-10-CM | POA: Diagnosis not present

## 2022-09-25 DIAGNOSIS — M5413 Radiculopathy, cervicothoracic region: Secondary | ICD-10-CM | POA: Diagnosis not present

## 2022-09-25 DIAGNOSIS — M9901 Segmental and somatic dysfunction of cervical region: Secondary | ICD-10-CM | POA: Diagnosis not present

## 2022-09-25 DIAGNOSIS — M9903 Segmental and somatic dysfunction of lumbar region: Secondary | ICD-10-CM | POA: Diagnosis not present

## 2022-10-03 DIAGNOSIS — M9901 Segmental and somatic dysfunction of cervical region: Secondary | ICD-10-CM | POA: Diagnosis not present

## 2022-10-03 DIAGNOSIS — M5413 Radiculopathy, cervicothoracic region: Secondary | ICD-10-CM | POA: Diagnosis not present

## 2022-10-03 DIAGNOSIS — M9904 Segmental and somatic dysfunction of sacral region: Secondary | ICD-10-CM | POA: Diagnosis not present

## 2022-10-03 DIAGNOSIS — M9902 Segmental and somatic dysfunction of thoracic region: Secondary | ICD-10-CM | POA: Diagnosis not present

## 2022-10-03 DIAGNOSIS — M9903 Segmental and somatic dysfunction of lumbar region: Secondary | ICD-10-CM | POA: Diagnosis not present

## 2022-10-06 ENCOUNTER — Telehealth: Payer: Medicaid Other | Admitting: Family

## 2022-10-06 DIAGNOSIS — F17290 Nicotine dependence, other tobacco product, uncomplicated: Secondary | ICD-10-CM | POA: Diagnosis not present

## 2022-10-06 DIAGNOSIS — R531 Weakness: Secondary | ICD-10-CM | POA: Diagnosis not present

## 2022-10-06 DIAGNOSIS — R112 Nausea with vomiting, unspecified: Secondary | ICD-10-CM | POA: Diagnosis not present

## 2022-10-06 DIAGNOSIS — Z86718 Personal history of other venous thrombosis and embolism: Secondary | ICD-10-CM | POA: Diagnosis not present

## 2022-10-06 DIAGNOSIS — Z8673 Personal history of transient ischemic attack (TIA), and cerebral infarction without residual deficits: Secondary | ICD-10-CM | POA: Diagnosis not present

## 2022-10-06 DIAGNOSIS — R0789 Other chest pain: Secondary | ICD-10-CM | POA: Diagnosis not present

## 2022-10-06 DIAGNOSIS — J45909 Unspecified asthma, uncomplicated: Secondary | ICD-10-CM | POA: Diagnosis not present

## 2022-10-06 DIAGNOSIS — Z885 Allergy status to narcotic agent status: Secondary | ICD-10-CM | POA: Diagnosis not present

## 2022-10-06 DIAGNOSIS — R202 Paresthesia of skin: Secondary | ICD-10-CM | POA: Diagnosis not present

## 2022-10-06 DIAGNOSIS — R079 Chest pain, unspecified: Secondary | ICD-10-CM | POA: Diagnosis not present

## 2022-10-06 DIAGNOSIS — Z888 Allergy status to other drugs, medicaments and biological substances status: Secondary | ICD-10-CM | POA: Diagnosis not present

## 2022-10-06 DIAGNOSIS — M5412 Radiculopathy, cervical region: Secondary | ICD-10-CM | POA: Diagnosis not present

## 2022-10-06 DIAGNOSIS — Z7901 Long term (current) use of anticoagulants: Secondary | ICD-10-CM | POA: Diagnosis not present

## 2022-10-06 NOTE — Patient Instructions (Signed)

## 2022-10-06 NOTE — Progress Notes (Signed)
Virtual Visit Consent   [name], you are scheduled for a virtual visit with a [facility] provider today. Just as with appointments in the office, your consent must be obtained to participate. Your consent will be active for this visit and any virtual visit you may have with one of our providers in the next 365 days. If you have a MyChart account, a copy of this consent can be sent to you electronically.  As this is a virtual visit, video technology does not allow for your provider to perform a traditional examination. This may limit your provider's ability to fully assess your condition. If your provider identifies any concerns that need to be evaluated in person or the need to arrange testing (such as labs, EKG, etc.), we will make arrangements to do so. Although advances in technology are sophisticated, we cannot ensure that it will always work on either your end or our end. If the connection with a video visit is poor, the visit may have to be switched to a telephone visit. With either a video or telephone visit, we are not always able to ensure that we have a secure connection.  By engaging in this virtual visit, you consent to the provision of healthcare and authorize for your insurance to be billed (if applicable) for the services provided during this visit. Depending on your insurance coverage, you may receive a charge related to this service.  I need to obtain your verbal consent now. Are you willing to proceed with your visit today? [name] has provided verbal consent on 10/06/2022 for a virtual visit (video or telephone). Barbara Rodney, FNP  Date: 10/06/2022 2:13 PM  Virtual Visit via Video Note   I, [name], connected with  [name]  ([id number], 02/01/1991) on 10/06/22 at  2:15 PM EDT by a video-enabled telemedicine application and verified that I am speaking with the correct person using two identifiers.  Location: Patient: Virtual Visit  Location Patient: Home Provider: Virtual Visit Location Provider: Home Office   I discussed the limitations of evaluation and management by telemedicine and the availability of in person appointments. The patient expressed understanding and agreed to proceed.    History of Present Illness: [name] is a 31 y.o. who identifies as a female who was assigned female at birth, and is being seen today for nausea and vomiting that started 2 AM.  HPI: Emesis  This is a new problem. The current episode started today. The problem occurs 2 to 4 times per day. The problem has been unchanged. The emesis has an appearance of stomach contents. There has been no fever. Associated symptoms include diarrhea. Pertinent negatives include no chest pain, chills, coughing, dizziness, fever, myalgias, sweats or URI. Risk factors include ill contacts. She has tried bed rest and increased fluids for the symptoms. The treatment provided mild relief.    Problems:  Patient Active Problem List   Diagnosis Date Noted   Epigastric abdominal pain 06/28/2022   Burping 06/28/2022   Current every day vaping 04/12/2022   Encounter for chronic pain management 04/04/2022   Constipation 01/08/2022   Gastroesophageal reflux disease without esophagitis 12/26/2021   History of iron deficiency 12/26/2021   COVID 07/2020   Pelvic pain in female 08/11/2018   Abnormal vaginal bleeding 08/05/2018   Nicotine addiction 03/13/2018   Abnormal uterine and vaginal bleeding, unspecified 04/17/2017   Adnexal fullness 04/17/2017   Lower abdominal pain 04/17/2017   Dysphagia    Paresthesia  Noncompliance    Clotting disorder ([place])    TIA (transient ischemic attack) 03/17/2017   Right sided weakness 03/17/2017   Calf pain    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   Right leg weakness 10/14/2016   Nausea with vomiting 07/08/2016   Chest pain 07/07/2016   Numbness on right side 07/07/2016   Depression     PTSD (post-traumatic stress disorder)    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    Tobacco abuse    Thrombocytosis 12/06/2014   PCOS (polycystic ovarian syndrome) 07/20/2014   Migraines 07/20/2014   Anticoagulation management encounter 02/10/2014   DVT (deep venous thrombosis) ([place]) 11/29/2013   Gallstones 09/24/2013   Palpitations 05/04/2013   History of PCOS 03/23/2013   History of DVT of lower extremity, 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    Allergies:  Allergies  Allergen Reactions   Fluoxetine Other (See Comments)    Pt told not to take with eliquis   Tramadol Rash    Flushed  Patient states she is not allergic to medication   Ceftriaxone Itching and Other (See Comments)    Red man syndrome   Gabapentin Other (See Comments)    Confusion/ chest pains   Levaquin [Levofloxacin] Other (See Comments)    Severe abdominal pain   Morphine And Related Itching and Nausea And Vomiting    Pt states not allergic   Nsaids Other (See Comments)    Pt told by hematologist not to take   Topamax [Topiramate] Other (See Comments)    Dizziness, black out spells   Toradol [Ketorolac Tromethamine] Itching and Other (See Comments)    Felt hot   Vicodin [Hydrocodone-Acetaminophen] Other (See Comments)    hallucinations   Depakote [Divalproex Sodium] Rash and Other (See Comments)    Skin felt like it was on fire   Methocarbamol Rash   Metoclopramide Rash and Hives   Medications:  Current Outpatient Medications:    alum & mag hydroxide-simeth (MAALOX/MYLANTA) 200-200-20 MG/5ML suspension, Take by mouth every 6 (six) hours as needed for indigestion or heartburn., Disp: , Rfl:    apixaban (ELIQUIS) 5 MG TABS tablet, Take 1 tablet (5 mg total) by mouth 2 (two) times daily., Disp: 60 tablet, Rfl: 1   atorvastatin (LIPITOR) 10 MG tablet, Take 1 tablet (10 mg total) by mouth at bedtime., Disp: 90 tablet, Rfl: 3   cholecalciferol (VITAMIN D3) 25 MCG (1000 UNIT) tablet, Take 1,000  Units by mouth daily., Disp: , Rfl:    ferrous sulfate 325 (65 FE) MG EC tablet, Take 325 mg by mouth daily with breakfast., Disp: , Rfl:    ondansetron (ZOFRAN-ODT) 4 MG disintegrating tablet, Take 4 mg by mouth 2 (two) times daily as needed., Disp: , Rfl: 2   oxyCODONE-acetaminophen (PERCOCET) 10-325 MG tablet, Take 1 tablet by mouth in the morning and at bedtime., Disp: , Rfl:    rizatriptan (MAXALT-MLT) 10 MG disintegrating tablet, Take 1 tablet (10 mg total) by mouth as needed for migraine. May repeat in 2 hours if needed, Disp: 10 tablet, Rfl: 2  Observations/Objective: Patient is well-developed, well-nourished in no acute distress.  Resting comfortably  at home.  Head is normocephalic, atraumatic.  No labored breathing.  Speech is clear and coherent with logical content.  Patient is alert and oriented at baseline.    Assessment and Plan: 1. Nausea and vomiting, unspecified vomiting type  Continue Zofran Force fluids [place] diet Follow up if symptoms worsen or do not improve  Follow Up Instructions: I discussed the assessment and treatment plan with the patient. The patient was provided an opportunity to ask questions and all were answered. The patient agreed with the plan and demonstrated an understanding of the instructions.  A copy of instructions were sent to the patient via MyChart unless otherwise noted below.     The patient was advised to call back or seek an in-person evaluation if the symptoms worsen or if the condition fails to improve as anticipated.  Time:  I spent 8 minutes with the patient via telehealth technology discussing the above problems/concerns.    Barbara Rodney, FNP

## 2022-10-07 DIAGNOSIS — D689 Coagulation defect, unspecified: Secondary | ICD-10-CM | POA: Diagnosis not present

## 2022-10-07 DIAGNOSIS — Z6841 Body Mass Index (BMI) 40.0 and over, adult: Secondary | ICD-10-CM | POA: Diagnosis not present

## 2022-10-07 DIAGNOSIS — R12 Heartburn: Secondary | ICD-10-CM | POA: Diagnosis not present

## 2022-10-07 DIAGNOSIS — N809 Endometriosis, unspecified: Secondary | ICD-10-CM | POA: Diagnosis not present

## 2022-10-07 DIAGNOSIS — E7800 Pure hypercholesterolemia, unspecified: Secondary | ICD-10-CM | POA: Diagnosis not present

## 2022-10-07 DIAGNOSIS — M542 Cervicalgia: Secondary | ICD-10-CM | POA: Diagnosis not present

## 2022-10-07 DIAGNOSIS — M545 Low back pain, unspecified: Secondary | ICD-10-CM | POA: Diagnosis not present

## 2022-10-07 DIAGNOSIS — F17210 Nicotine dependence, cigarettes, uncomplicated: Secondary | ICD-10-CM | POA: Diagnosis not present

## 2022-10-07 DIAGNOSIS — E559 Vitamin D deficiency, unspecified: Secondary | ICD-10-CM | POA: Diagnosis not present

## 2022-10-07 DIAGNOSIS — R102 Pelvic and perineal pain: Secondary | ICD-10-CM | POA: Diagnosis not present

## 2022-10-07 DIAGNOSIS — Z79899 Other long term (current) drug therapy: Secondary | ICD-10-CM | POA: Diagnosis not present

## 2022-10-07 DIAGNOSIS — N9489 Other specified conditions associated with female genital organs and menstrual cycle: Secondary | ICD-10-CM | POA: Diagnosis not present

## 2022-10-09 ENCOUNTER — Telehealth: Payer: Self-pay | Admitting: General Practice

## 2022-10-09 NOTE — Telephone Encounter (Signed)
Transition Care Management Unsuccessful Follow-up Telephone Call  Date of discharge and from where:  10/06/22 from [facility]  Attempts:  1st Attempt  Reason for unsuccessful TCM follow-up call:  No answer/busy Unable to leave voicemail.    

## 2022-10-11 NOTE — Telephone Encounter (Signed)
Transition Care Management Unsuccessful Follow-up Telephone Call  Date of discharge and from where:  10/29/3 from [facility]  Attempts:  2nd Attempt  Reason for unsuccessful TCM follow-up call:  No answer/busy Unable to leave voicemail.

## 2022-10-14 DIAGNOSIS — M5413 Radiculopathy, cervicothoracic region: Secondary | ICD-10-CM | POA: Diagnosis not present

## 2022-10-14 DIAGNOSIS — M9903 Segmental and somatic dysfunction of lumbar region: Secondary | ICD-10-CM | POA: Diagnosis not present

## 2022-10-14 DIAGNOSIS — M9901 Segmental and somatic dysfunction of cervical region: Secondary | ICD-10-CM | POA: Diagnosis not present

## 2022-10-14 DIAGNOSIS — M9902 Segmental and somatic dysfunction of thoracic region: Secondary | ICD-10-CM | POA: Diagnosis not present

## 2022-10-14 DIAGNOSIS — M9904 Segmental and somatic dysfunction of sacral region: Secondary | ICD-10-CM | POA: Diagnosis not present

## 2022-10-16 NOTE — Telephone Encounter (Signed)
Transition Care Management Unsuccessful Follow-up Telephone Call  Date of discharge and from where:  10/06/22 from [facility]  Attempts:  3rd Attempt  Reason for unsuccessful TCM follow-up call:  No answer/busy

## 2022-10-22 DIAGNOSIS — E7800 Pure hypercholesterolemia, unspecified: Secondary | ICD-10-CM | POA: Diagnosis not present

## 2022-10-22 DIAGNOSIS — N809 Endometriosis, unspecified: Secondary | ICD-10-CM | POA: Diagnosis not present

## 2022-10-22 DIAGNOSIS — N915 Oligomenorrhea, unspecified: Secondary | ICD-10-CM | POA: Diagnosis not present

## 2022-10-22 DIAGNOSIS — F17210 Nicotine dependence, cigarettes, uncomplicated: Secondary | ICD-10-CM | POA: Diagnosis not present

## 2022-10-22 DIAGNOSIS — M545 Low back pain, unspecified: Secondary | ICD-10-CM | POA: Diagnosis not present

## 2022-10-22 DIAGNOSIS — R102 Pelvic and perineal pain: Secondary | ICD-10-CM | POA: Diagnosis not present

## 2022-10-22 DIAGNOSIS — R12 Heartburn: Secondary | ICD-10-CM | POA: Diagnosis not present

## 2022-10-22 DIAGNOSIS — M542 Cervicalgia: Secondary | ICD-10-CM | POA: Diagnosis not present

## 2022-10-22 DIAGNOSIS — D689 Coagulation defect, unspecified: Secondary | ICD-10-CM | POA: Diagnosis not present

## 2022-10-22 DIAGNOSIS — N9489 Other specified conditions associated with female genital organs and menstrual cycle: Secondary | ICD-10-CM | POA: Diagnosis not present

## 2022-10-22 DIAGNOSIS — E559 Vitamin D deficiency, unspecified: Secondary | ICD-10-CM | POA: Diagnosis not present

## 2022-10-22 DIAGNOSIS — Z6841 Body Mass Index (BMI) 40.0 and over, adult: Secondary | ICD-10-CM | POA: Diagnosis not present

## 2022-11-06 ENCOUNTER — Ambulatory Visit: Payer: Medicaid Other | Attending: Cardiology | Admitting: Cardiology

## 2022-11-06 DIAGNOSIS — R12 Heartburn: Secondary | ICD-10-CM | POA: Diagnosis not present

## 2022-11-06 DIAGNOSIS — F17210 Nicotine dependence, cigarettes, uncomplicated: Secondary | ICD-10-CM | POA: Diagnosis not present

## 2022-11-06 DIAGNOSIS — R102 Pelvic and perineal pain: Secondary | ICD-10-CM | POA: Diagnosis not present

## 2022-11-06 DIAGNOSIS — Z79899 Other long term (current) drug therapy: Secondary | ICD-10-CM | POA: Diagnosis not present

## 2022-11-06 DIAGNOSIS — D689 Coagulation defect, unspecified: Secondary | ICD-10-CM | POA: Diagnosis not present

## 2022-11-06 DIAGNOSIS — N915 Oligomenorrhea, unspecified: Secondary | ICD-10-CM | POA: Diagnosis not present

## 2022-11-06 DIAGNOSIS — E7800 Pure hypercholesterolemia, unspecified: Secondary | ICD-10-CM | POA: Diagnosis not present

## 2022-11-06 DIAGNOSIS — M542 Cervicalgia: Secondary | ICD-10-CM | POA: Diagnosis not present

## 2022-11-06 DIAGNOSIS — N9489 Other specified conditions associated with female genital organs and menstrual cycle: Secondary | ICD-10-CM | POA: Diagnosis not present

## 2022-11-06 DIAGNOSIS — Z6841 Body Mass Index (BMI) 40.0 and over, adult: Secondary | ICD-10-CM | POA: Diagnosis not present

## 2022-11-06 DIAGNOSIS — N809 Endometriosis, unspecified: Secondary | ICD-10-CM | POA: Diagnosis not present

## 2022-11-06 DIAGNOSIS — M545 Low back pain, unspecified: Secondary | ICD-10-CM | POA: Diagnosis not present

## 2022-11-06 DIAGNOSIS — E559 Vitamin D deficiency, unspecified: Secondary | ICD-10-CM | POA: Diagnosis not present

## 2022-11-17 DIAGNOSIS — S39012A Strain of muscle, fascia and tendon of lower back, initial encounter: Secondary | ICD-10-CM | POA: Diagnosis not present

## 2022-11-17 DIAGNOSIS — Z8673 Personal history of transient ischemic attack (TIA), and cerebral infarction without residual deficits: Secondary | ICD-10-CM | POA: Diagnosis not present

## 2022-11-17 DIAGNOSIS — Z86718 Personal history of other venous thrombosis and embolism: Secondary | ICD-10-CM | POA: Diagnosis not present

## 2022-11-17 DIAGNOSIS — Z951 Presence of aortocoronary bypass graft: Secondary | ICD-10-CM | POA: Diagnosis not present

## 2022-11-17 DIAGNOSIS — F17290 Nicotine dependence, other tobacco product, uncomplicated: Secondary | ICD-10-CM | POA: Diagnosis not present

## 2022-11-17 DIAGNOSIS — Z888 Allergy status to other drugs, medicaments and biological substances status: Secondary | ICD-10-CM | POA: Diagnosis not present

## 2022-11-17 DIAGNOSIS — Z7901 Long term (current) use of anticoagulants: Secondary | ICD-10-CM | POA: Diagnosis not present

## 2022-11-17 DIAGNOSIS — Z886 Allergy status to analgesic agent status: Secondary | ICD-10-CM | POA: Diagnosis not present

## 2022-11-17 DIAGNOSIS — Z885 Allergy status to narcotic agent status: Secondary | ICD-10-CM | POA: Diagnosis not present

## 2022-11-17 DIAGNOSIS — F17210 Nicotine dependence, cigarettes, uncomplicated: Secondary | ICD-10-CM | POA: Diagnosis not present

## 2022-11-18 ENCOUNTER — Telehealth: Payer: Self-pay | Admitting: General Practice

## 2022-11-18 NOTE — Telephone Encounter (Signed)
Transition Care Management Unsuccessful Follow-up Telephone Call  Date of discharge and from where:  11/17/22 from [facility]  Attempts:  1st Attempt  Reason for unsuccessful TCM follow-up call:  Left voice message

## 2022-11-18 NOTE — Telephone Encounter (Signed)
Transition Care Management Follow-up Telephone Call Date of discharge and from where: 11/17/22 from [facility] How have you been since you were released from the hospital? Got injured at work. She has been following up with occupational health for her worker's comp for her back injury. She is doing ok at this time. Any questions or concerns? No  Items Reviewed: Did the pt receive and understand the discharge instructions provided? Yes  Medications obtained and verified? Yes  Other? No  Any new allergies since your discharge? No  Dietary orders reviewed? Yes Do you have support at home? Yes   Home Care and Equipment/Supplies: Were home health services ordered? no   Functional Questionnaire: (I = Independent and D = Dependent) ADLs: I  Bathing/Dressing- I  Meal Prep- I  Eating- I  Maintaining continence- I  Transferring/Ambulation- I  Managing Meds- I  Follow up appointments reviewed:  PCP Hospital f/u appt confirmed? No   Specialist Hospital f/u appt confirmed? No   Are transportation arrangements needed? No  If their condition worsens, is the pt aware to call PCP or go to the Emergency Dept.? Yes Was the patient provided with contact information for the PCP's office or ED? Yes Was to pt encouraged to call back with questions or concerns? Yes

## 2022-11-22 DIAGNOSIS — R12 Heartburn: Secondary | ICD-10-CM | POA: Diagnosis not present

## 2022-11-22 DIAGNOSIS — E559 Vitamin D deficiency, unspecified: Secondary | ICD-10-CM | POA: Diagnosis not present

## 2022-11-22 DIAGNOSIS — M545 Low back pain, unspecified: Secondary | ICD-10-CM | POA: Diagnosis not present

## 2022-11-22 DIAGNOSIS — R102 Pelvic and perineal pain: Secondary | ICD-10-CM | POA: Diagnosis not present

## 2022-11-22 DIAGNOSIS — N9489 Other specified conditions associated with female genital organs and menstrual cycle: Secondary | ICD-10-CM | POA: Diagnosis not present

## 2022-11-22 DIAGNOSIS — N915 Oligomenorrhea, unspecified: Secondary | ICD-10-CM | POA: Diagnosis not present

## 2022-11-22 DIAGNOSIS — D689 Coagulation defect, unspecified: Secondary | ICD-10-CM | POA: Diagnosis not present

## 2022-11-22 DIAGNOSIS — Z79899 Other long term (current) drug therapy: Secondary | ICD-10-CM | POA: Diagnosis not present

## 2022-11-22 DIAGNOSIS — Z6841 Body Mass Index (BMI) 40.0 and over, adult: Secondary | ICD-10-CM | POA: Diagnosis not present

## 2022-11-22 DIAGNOSIS — F17210 Nicotine dependence, cigarettes, uncomplicated: Secondary | ICD-10-CM | POA: Diagnosis not present

## 2022-11-22 DIAGNOSIS — M542 Cervicalgia: Secondary | ICD-10-CM | POA: Diagnosis not present

## 2022-11-22 DIAGNOSIS — E7800 Pure hypercholesterolemia, unspecified: Secondary | ICD-10-CM | POA: Diagnosis not present

## 2022-11-22 DIAGNOSIS — N809 Endometriosis, unspecified: Secondary | ICD-10-CM | POA: Diagnosis not present

## 2022-12-06 ENCOUNTER — Telehealth: Payer: Self-pay

## 2022-12-06 NOTE — Telephone Encounter (Signed)
[name] called wanting Dr Linford Arnold to send her in Oxycodone 10-325 mg every 6 hours for 7 days (she takes 3 daily). She states her provider at the pain clinic is out of town and is unable to fill the prescription. She states she has an appointment on 12/10/22. However, the PA will run out January 1st and it will take up to 3 days for the PA to process.

## 2022-12-06 NOTE — Telephone Encounter (Signed)
We do not cover pain medications.  The provider should have a partner or an on-call number that could be accessed.  But we do not do this because it would break her pain contract.

## 2022-12-10 DIAGNOSIS — N915 Oligomenorrhea, unspecified: Secondary | ICD-10-CM | POA: Diagnosis not present

## 2022-12-10 DIAGNOSIS — N809 Endometriosis, unspecified: Secondary | ICD-10-CM | POA: Diagnosis not present

## 2022-12-10 DIAGNOSIS — D689 Coagulation defect, unspecified: Secondary | ICD-10-CM | POA: Diagnosis not present

## 2022-12-10 DIAGNOSIS — E7800 Pure hypercholesterolemia, unspecified: Secondary | ICD-10-CM | POA: Diagnosis not present

## 2022-12-10 DIAGNOSIS — M542 Cervicalgia: Secondary | ICD-10-CM | POA: Diagnosis not present

## 2022-12-10 DIAGNOSIS — N9489 Other specified conditions associated with female genital organs and menstrual cycle: Secondary | ICD-10-CM | POA: Diagnosis not present

## 2022-12-10 DIAGNOSIS — F17210 Nicotine dependence, cigarettes, uncomplicated: Secondary | ICD-10-CM | POA: Diagnosis not present

## 2022-12-10 DIAGNOSIS — E559 Vitamin D deficiency, unspecified: Secondary | ICD-10-CM | POA: Diagnosis not present

## 2022-12-10 DIAGNOSIS — R102 Pelvic and perineal pain: Secondary | ICD-10-CM | POA: Diagnosis not present

## 2022-12-10 DIAGNOSIS — Z6841 Body Mass Index (BMI) 40.0 and over, adult: Secondary | ICD-10-CM | POA: Diagnosis not present

## 2022-12-10 DIAGNOSIS — R12 Heartburn: Secondary | ICD-10-CM | POA: Diagnosis not present

## 2022-12-10 DIAGNOSIS — Z79899 Other long term (current) drug therapy: Secondary | ICD-10-CM | POA: Diagnosis not present

## 2022-12-10 DIAGNOSIS — M545 Low back pain, unspecified: Secondary | ICD-10-CM | POA: Diagnosis not present

## 2022-12-11 NOTE — Telephone Encounter (Signed)
Sent patient a message via Mychart message as stated by Dr. Madilyn Fireman.

## 2022-12-12 DIAGNOSIS — R200 Anesthesia of skin: Secondary | ICD-10-CM | POA: Diagnosis not present

## 2022-12-12 DIAGNOSIS — Z885 Allergy status to narcotic agent status: Secondary | ICD-10-CM | POA: Diagnosis not present

## 2022-12-12 DIAGNOSIS — F17210 Nicotine dependence, cigarettes, uncomplicated: Secondary | ICD-10-CM | POA: Diagnosis not present

## 2022-12-12 DIAGNOSIS — F419 Anxiety disorder, unspecified: Secondary | ICD-10-CM | POA: Diagnosis not present

## 2022-12-12 DIAGNOSIS — Z888 Allergy status to other drugs, medicaments and biological substances status: Secondary | ICD-10-CM | POA: Diagnosis not present

## 2022-12-12 DIAGNOSIS — Z886 Allergy status to analgesic agent status: Secondary | ICD-10-CM | POA: Diagnosis not present

## 2022-12-12 DIAGNOSIS — R0789 Other chest pain: Secondary | ICD-10-CM | POA: Diagnosis not present

## 2022-12-12 DIAGNOSIS — Z7901 Long term (current) use of anticoagulants: Secondary | ICD-10-CM | POA: Diagnosis not present

## 2022-12-12 DIAGNOSIS — Z79899 Other long term (current) drug therapy: Secondary | ICD-10-CM | POA: Diagnosis not present

## 2022-12-12 DIAGNOSIS — R202 Paresthesia of skin: Secondary | ICD-10-CM | POA: Diagnosis not present

## 2022-12-13 ENCOUNTER — Telehealth: Payer: Self-pay | Admitting: General Practice

## 2022-12-13 NOTE — Telephone Encounter (Signed)
Transition Care Management Follow-up Telephone Call Date of discharge and from where: 12/12/22 from [facility] How have you been since you were released from the hospital? Doing better. All of her symptoms have resolved. Any questions or concerns? No  Items Reviewed: Did the pt receive and understand the discharge instructions provided? Yes  Medications obtained and verified? Yes  Other? No  Any new allergies since your discharge? No  Dietary orders reviewed? Yes Do you have support at home? Yes   Home Care and Equipment/Supplies: Were home health services ordered? no   Functional Questionnaire: (I = Independent and D = Dependent) ADLs: I  Bathing/Dressing- I  Meal Prep- I  Eating- I  Maintaining continence- I  Transferring/Ambulation- I  Managing Meds- I  Follow up appointments reviewed:  PCP Hospital f/u appt confirmed? No  She said that she will call back to schedule. [facility] f/u appt confirmed? No   Are transportation arrangements needed? No  If their condition worsens, is the pt aware to call PCP or go to the Emergency Dept.? Yes Was the patient provided with contact information for the PCP's office or ED? Yes Was to pt encouraged to call back with questions or concerns? Yes

## 2022-12-25 DIAGNOSIS — Z20822 Contact with and (suspected) exposure to covid-19: Secondary | ICD-10-CM | POA: Diagnosis not present

## 2022-12-25 DIAGNOSIS — J069 Acute upper respiratory infection, unspecified: Secondary | ICD-10-CM | POA: Diagnosis not present

## 2022-12-25 DIAGNOSIS — J029 Acute pharyngitis, unspecified: Secondary | ICD-10-CM | POA: Diagnosis not present

## 2022-12-25 DIAGNOSIS — R6883 Chills (without fever): Secondary | ICD-10-CM | POA: Diagnosis not present

## 2023-01-03 DIAGNOSIS — Z6841 Body Mass Index (BMI) 40.0 and over, adult: Secondary | ICD-10-CM | POA: Diagnosis not present

## 2023-01-03 DIAGNOSIS — Z79899 Other long term (current) drug therapy: Secondary | ICD-10-CM | POA: Diagnosis not present

## 2023-01-03 DIAGNOSIS — R12 Heartburn: Secondary | ICD-10-CM | POA: Diagnosis not present

## 2023-01-03 DIAGNOSIS — R200 Anesthesia of skin: Secondary | ICD-10-CM | POA: Diagnosis not present

## 2023-01-03 DIAGNOSIS — D689 Coagulation defect, unspecified: Secondary | ICD-10-CM | POA: Diagnosis not present

## 2023-01-03 DIAGNOSIS — M542 Cervicalgia: Secondary | ICD-10-CM | POA: Diagnosis not present

## 2023-01-03 DIAGNOSIS — M545 Low back pain, unspecified: Secondary | ICD-10-CM | POA: Diagnosis not present

## 2023-01-03 DIAGNOSIS — F17210 Nicotine dependence, cigarettes, uncomplicated: Secondary | ICD-10-CM | POA: Diagnosis not present

## 2023-01-03 DIAGNOSIS — S5012XA Contusion of left forearm, initial encounter: Secondary | ICD-10-CM | POA: Diagnosis not present

## 2023-01-03 DIAGNOSIS — N915 Oligomenorrhea, unspecified: Secondary | ICD-10-CM | POA: Diagnosis not present

## 2023-01-03 DIAGNOSIS — E7800 Pure hypercholesterolemia, unspecified: Secondary | ICD-10-CM | POA: Diagnosis not present

## 2023-01-03 DIAGNOSIS — N809 Endometriosis, unspecified: Secondary | ICD-10-CM | POA: Diagnosis not present

## 2023-01-03 DIAGNOSIS — N9489 Other specified conditions associated with female genital organs and menstrual cycle: Secondary | ICD-10-CM | POA: Diagnosis not present

## 2023-01-08 DIAGNOSIS — Z79899 Other long term (current) drug therapy: Secondary | ICD-10-CM | POA: Diagnosis not present

## 2023-01-25 DIAGNOSIS — R12 Heartburn: Secondary | ICD-10-CM | POA: Diagnosis not present

## 2023-01-25 DIAGNOSIS — D689 Coagulation defect, unspecified: Secondary | ICD-10-CM | POA: Diagnosis not present

## 2023-01-25 DIAGNOSIS — M542 Cervicalgia: Secondary | ICD-10-CM | POA: Diagnosis not present

## 2023-01-25 DIAGNOSIS — Z6841 Body Mass Index (BMI) 40.0 and over, adult: Secondary | ICD-10-CM | POA: Diagnosis not present

## 2023-01-25 DIAGNOSIS — F17210 Nicotine dependence, cigarettes, uncomplicated: Secondary | ICD-10-CM | POA: Diagnosis not present

## 2023-01-25 DIAGNOSIS — N915 Oligomenorrhea, unspecified: Secondary | ICD-10-CM | POA: Diagnosis not present

## 2023-01-25 DIAGNOSIS — Z1331 Encounter for screening for depression: Secondary | ICD-10-CM | POA: Diagnosis not present

## 2023-01-25 DIAGNOSIS — E7800 Pure hypercholesterolemia, unspecified: Secondary | ICD-10-CM | POA: Diagnosis not present

## 2023-01-25 DIAGNOSIS — N809 Endometriosis, unspecified: Secondary | ICD-10-CM | POA: Diagnosis not present

## 2023-01-25 DIAGNOSIS — Z79899 Other long term (current) drug therapy: Secondary | ICD-10-CM | POA: Diagnosis not present

## 2023-01-25 DIAGNOSIS — M545 Low back pain, unspecified: Secondary | ICD-10-CM | POA: Diagnosis not present

## 2023-01-25 DIAGNOSIS — N9489 Other specified conditions associated with female genital organs and menstrual cycle: Secondary | ICD-10-CM | POA: Diagnosis not present

## 2023-01-30 ENCOUNTER — Telehealth: Payer: Medicaid Other | Admitting: Physician Assistant

## 2023-01-30 ENCOUNTER — Encounter: Payer: Self-pay | Admitting: Physician Assistant

## 2023-01-30 DIAGNOSIS — R6889 Other general symptoms and signs: Secondary | ICD-10-CM | POA: Diagnosis not present

## 2023-01-30 NOTE — Progress Notes (Signed)
Virtual Visit Consent   [name], you are scheduled for a virtual visit with a [facility] provider today. Just as with appointments in the office, your consent must be obtained to participate. Your consent will be active for this visit and any virtual visit you may have with one of our providers in the next 365 days. If you have a MyChart account, a copy of this consent can be sent to you electronically.  As this is a virtual visit, video technology does not allow for your provider to perform a traditional examination. This may limit your provider's ability to fully assess your condition. If your provider identifies any concerns that need to be evaluated in person or the need to arrange testing (such as labs, EKG, etc.), we will make arrangements to do so. Although advances in technology are sophisticated, we cannot ensure that it will always work on either your end or our end. If the connection with a video visit is poor, the visit may have to be switched to a telephone visit. With either a video or telephone visit, we are not always able to ensure that we have a secure connection.  By engaging in this virtual visit, you consent to the provision of healthcare and authorize for your insurance to be billed (if applicable) for the services provided during this visit. Depending on your insurance coverage, you may receive a charge related to this service.  I need to obtain your verbal consent now. Are you willing to proceed with your visit today? [name] has provided verbal consent on 01/30/2023 for a virtual visit (video or telephone). [name], [place]  Date: 01/30/2023 6:31 PM  Virtual Visit via Video Note   I, [name], connected with  [name]  ([id number], Jul 20, 1991) on 01/30/23 at  6:30 PM EST by a video-enabled telemedicine application and verified that I am speaking with the correct person using two identifiers.  Location: Patient:  Virtual Visit Location Patient: Home Provider: Virtual Visit Location Provider: Home Office   I discussed the limitations of evaluation and management by telemedicine and the availability of in person appointments. The patient expressed understanding and agreed to proceed.    History of Present Illness: [name] is a 32 y.o. who identifies as a female who was assigned female at birth, and is being seen today for URI symptoms tarting yesterday. Noted fever (Tmax 102.3), headaches, body aches, nasal congestion, some scratchy throat  Has not taken a home COVID test. Took younger daughter to UC earlier in the week and was negative or flu/COVID/strep. She was having similar symptoms but better now. Has taken Tylenol for fever. Missed work today due to symptoms.    HPI: HPI  Problems:  Patient Active Problem List   Diagnosis Date Noted   Epigastric abdominal pain 06/28/2022   Burping 06/28/2022   Current every day vaping 04/12/2022   Encounter for chronic pain management 04/04/2022   Constipation 01/08/2022   Gastroesophageal reflux disease without esophagitis 12/26/2021   History of iron deficiency 12/26/2021   COVID 07/2020   Pelvic pain in female 08/11/2018   Abnormal vaginal bleeding 08/05/2018   Nicotine addiction 03/13/2018   Abnormal uterine and vaginal bleeding, unspecified 04/17/2017   Adnexal fullness 04/17/2017   Lower abdominal pain 04/17/2017   Dysphagia    Paresthesia    Noncompliance    Clotting disorder ([place])    TIA (transient ischemic attack) 03/17/2017   Right sided weakness 03/17/2017  Calf pain    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   Right leg weakness 10/14/2016   Nausea with vomiting 07/08/2016   Chest pain 07/07/2016   Numbness on right side 07/07/2016   Depression    PTSD (post-traumatic stress disorder)    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    Tobacco abuse    Thrombocytosis  12/06/2014   PCOS (polycystic ovarian syndrome) 07/20/2014   Migraines 07/20/2014   Anticoagulation management encounter 02/10/2014   DVT (deep venous thrombosis) ([place]) 11/29/2013   Gallstones 09/24/2013   Palpitations 05/04/2013   History of PCOS 03/23/2013   History of DVT of lower extremity, 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    Allergies:  Allergies  Allergen Reactions   Fluoxetine Other (See Comments)    Pt told not to take with eliquis   Tramadol Rash    Flushed  Patient states she is not allergic to medication   Ceftriaxone Itching and Other (See Comments)    Red man syndrome   Gabapentin Other (See Comments)    Confusion/ chest pains   Levaquin [Levofloxacin] Other (See Comments)    Severe abdominal pain   Morphine And Related Itching and Nausea And Vomiting    Pt states not allergic   Nsaids Other (See Comments)    Pt told by hematologist not to take   Topamax [Topiramate] Other (See Comments)    Dizziness, black out spells   Toradol [Ketorolac Tromethamine] Itching and Other (See Comments)    Felt hot   Vicodin [Hydrocodone-Acetaminophen] Other (See Comments)    hallucinations   Depakote [Divalproex Sodium] Rash and Other (See Comments)    Skin felt like it was on fire   Methocarbamol Rash   Metoclopramide Rash and Hives   Medications:  Current Outpatient Medications:    alum & mag hydroxide-simeth (MAALOX/MYLANTA) 200-200-20 MG/5ML suspension, Take by mouth every 6 (six) hours as needed for indigestion or heartburn., Disp: , Rfl:    apixaban (ELIQUIS) 5 MG TABS tablet, Take 1 tablet (5 mg total) by mouth 2 (two) times daily., Disp: 60 tablet, Rfl: 1   atorvastatin (LIPITOR) 10 MG tablet, Take 1 tablet (10 mg total) by mouth at bedtime., Disp: 90 tablet, Rfl: 3   cholecalciferol (VITAMIN D3) 25 MCG (1000 UNIT) tablet, Take 1,000 Units by mouth daily., Disp: , Rfl:    ferrous sulfate 325 (65 FE) MG EC tablet, Take 325 mg by mouth daily with breakfast., Disp: , Rfl:    ondansetron  (ZOFRAN-ODT) 4 MG disintegrating tablet, Take 4 mg by mouth 2 (two) times daily as needed., Disp: , Rfl: 2   oxyCODONE-acetaminophen (PERCOCET) 10-325 MG tablet, Take 1 tablet by mouth in the morning and at bedtime., Disp: , Rfl:    rizatriptan (MAXALT-MLT) 10 MG disintegrating tablet, Take 1 tablet (10 mg total) by mouth as needed for migraine. May repeat in 2 hours if needed, Disp: 10 tablet, Rfl: 2  Observations/Objective: Patient is well-developed, well-nourished in no acute distress.  Resting comfortably, walking around [place], shopping during her visit.\ Head is normocephalic, atraumatic.  No labored breathing. Speech is clear and coherent with logical content.  Patient is alert and oriented at baseline.   Assessment and Plan: 1. Flu-like symptoms  She is walking around the store shopping during entirety of interview without any windedness, or shortness of breath. Discussed concern for influenza but needs COVID rule out. Should not be out an about with her fever and symptoms, and unmasked due to risk  of spreading illness to others. Instructed her to get a COVID test to take at home and to message [place] back with results ASAP. If positive, will treat accordingly. If negative, will treat as influenza and start Tamiflu giving health history. Supportive measures and OTC medications reviewed. Discussed work note to excuse today through tomorrow. Will adjust pending COVID results.   Follow Up Instructions: I discussed the assessment and treatment plan with the patient. The patient was provided an opportunity to ask questions and all were answered. The patient agreed with the plan and demonstrated an understanding of the instructions.  A copy of instructions were sent to the patient via MyChart unless otherwise noted below.   The patient was advised to call back or seek an in-person evaluation if the symptoms worsen or if the condition fails to improve as anticipated.  Time:  I spent 10 minutes  with the patient via telehealth technology discussing the above problems/concerns.    Barbara Rio, PA-C

## 2023-01-30 NOTE — Patient Instructions (Signed)
  [name], thank you for joining [name], PA-C for today's virtual visit.  While this provider is not your primary care provider (PCP), if your PCP is located in our provider database this encounter information will be shared with them immediately following your visit.   [facility] account gives you access to today's visit and all your visits, tests, and labs performed at [facility] " click here if you don't have a [facility] account or go to [email or web address]  Consent: (Patient) [name] provided verbal consent for this virtual visit at the beginning of the encounter.  Current Medications:  Current Outpatient Medications:    alum & mag hydroxide-simeth (MAALOX/MYLANTA) 200-200-20 MG/5ML suspension, Take by mouth every 6 (six) hours as needed for indigestion or heartburn., Disp: , Rfl:    apixaban (ELIQUIS) 5 MG TABS tablet, Take 1 tablet (5 mg total) by mouth 2 (two) times daily., Disp: 60 tablet, Rfl: 1   atorvastatin (LIPITOR) 10 MG tablet, Take 1 tablet (10 mg total) by mouth at bedtime., Disp: 90 tablet, Rfl: 3   cholecalciferol (VITAMIN D3) 25 MCG (1000 UNIT) tablet, Take 1,000 Units by mouth daily., Disp: , Rfl:    ferrous sulfate 325 (65 FE) MG EC tablet, Take 325 mg by mouth daily with breakfast., Disp: , Rfl:    ondansetron (ZOFRAN-ODT) 4 MG disintegrating tablet, Take 4 mg by mouth 2 (two) times daily as needed., Disp: , Rfl: 2   oxyCODONE-acetaminophen (PERCOCET) 10-325 MG tablet, Take 1 tablet by mouth in the morning and at bedtime., Disp: , Rfl:    rizatriptan (MAXALT-MLT) 10 MG disintegrating tablet, Take 1 tablet (10 mg total) by mouth as needed for migraine. May repeat in 2 hours if needed, Disp: 10 tablet, Rfl: 2   Medications ordered in this encounter:  No orders of the defined types were placed in this encounter.    *If you need refills on other medications prior to your next appointment, please  contact your pharmacy*  Follow-Up: Call back or seek an in-person evaluation if the symptoms worsen or if the condition fails to improve as anticipated.  [facility] [phone]  Other Instructions Take the COVID test as directed. Message me with the results.  Keep hydrated and rest. Ok to continue Tylenol.  Salt-water gargles for sore throat.  You can use OTc Mucinex to thin chest congestion.  Start a saline nasal rinse.  We will make further adjustments to treatment once we have your COVID results.     If you have been instructed to have an in-person evaluation today at a local Urgent Care facility, please use the link below. It will take you to a list of all of our available [facility], including address, phone number and hours of operation. Please do not delay care.  [facility]  If you or a family member do not have a primary care provider, use the link below to schedule a visit and establish care. When you choose a [facility] primary care physician or advanced practice provider, you gain a long-term partner in health. Find a Primary Care Provider  Learn more about [facility] in-office and virtual care options: [facility]

## 2023-02-01 ENCOUNTER — Emergency Department (HOSPITAL_BASED_OUTPATIENT_CLINIC_OR_DEPARTMENT_OTHER): Payer: Medicaid Other

## 2023-02-01 ENCOUNTER — Encounter (HOSPITAL_BASED_OUTPATIENT_CLINIC_OR_DEPARTMENT_OTHER): Payer: Self-pay | Admitting: Emergency Medicine

## 2023-02-01 ENCOUNTER — Emergency Department (HOSPITAL_BASED_OUTPATIENT_CLINIC_OR_DEPARTMENT_OTHER)
Admission: EM | Admit: 2023-02-01 | Discharge: 2023-02-01 | Disposition: A | Discharge status: Home / Self Care | Payer: Medicaid Other | Attending: Emergency Medicine | Admitting: Emergency Medicine

## 2023-02-01 DIAGNOSIS — J45909 Unspecified asthma, uncomplicated: Secondary | ICD-10-CM | POA: Insufficient documentation

## 2023-02-01 DIAGNOSIS — Z1152 Encounter for screening for COVID-19: Secondary | ICD-10-CM | POA: Insufficient documentation

## 2023-02-01 DIAGNOSIS — R0789 Other chest pain: Secondary | ICD-10-CM | POA: Diagnosis not present

## 2023-02-01 DIAGNOSIS — R079 Chest pain, unspecified: Secondary | ICD-10-CM | POA: Insufficient documentation

## 2023-02-01 DIAGNOSIS — Z8673 Personal history of transient ischemic attack (TIA), and cerebral infarction without residual deficits: Secondary | ICD-10-CM | POA: Insufficient documentation

## 2023-02-01 DIAGNOSIS — Z7901 Long term (current) use of anticoagulants: Secondary | ICD-10-CM | POA: Insufficient documentation

## 2023-02-01 LAB — BASIC METABOLIC PANEL
Anion gap: 7 (ref 5–15)
BUN: 9 mg/dL (ref 6–20)
CO2: 27 mmol/L (ref 22–32)
Calcium: 8.7 mg/dL — ABNORMAL LOW (ref 8.9–10.3)
Chloride: 101 mmol/L (ref 98–111)
Creatinine, Ser: 0.7 mg/dL (ref 0.44–1.00)
GFR, Estimated: >60 mL/min (ref >60)
Glucose, Bld: 97 mg/dL (ref 70–99)
Potassium: 3.7 mmol/L (ref 3.5–5.1)
Sodium: 135 mmol/L (ref 135–145)

## 2023-02-01 LAB — CBC WITH DIFFERENTIAL/PLATELET
Abs Immature Granulocytes: 0.01 10*3/uL (ref 0.00–0.07)
Basophils Absolute: 0 10*3/uL (ref 0.0–0.1)
Basophils Relative: 0 %
Eosinophils Absolute: 0.1 10*3/uL (ref 0.0–0.5)
Eosinophils Relative: 2 %
HCT: 44.4 % (ref 36.0–46.0)
Hemoglobin: 15 g/dL (ref 12.0–15.0)
Immature Granulocytes: 0 %
Lymphocytes Relative: 31 %
Lymphs Abs: 1.7 10*3/uL (ref 0.7–4.0)
MCH: 29.2 pg (ref 26.0–34.0)
MCHC: 33.8 g/dL (ref 30.0–36.0)
MCV: 86.5 fL (ref 80.0–100.0)
Monocytes Absolute: 0.4 10*3/uL (ref 0.1–1.0)
Monocytes Relative: 8 %
Neutro Abs: 3.3 10*3/uL (ref 1.7–7.7)
Neutrophils Relative %: 59 %
Platelets: 414 10*3/uL — ABNORMAL HIGH (ref 150–400)
RBC: 5.13 MIL/uL — ABNORMAL HIGH (ref 3.87–5.11)
RDW: 12.2 % (ref 11.5–15.5)
WBC: 5.6 10*3/uL (ref 4.0–10.5)
nRBC: 0 % (ref 0.0–0.2)

## 2023-02-01 LAB — RESP PANEL BY RT-PCR (RSV, FLU A&B, COVID)  RVPGX2
Influenza A by PCR: NEGATIVE
Influenza B by PCR: NEGATIVE
Resp Syncytial Virus by PCR: NEGATIVE
SARS Coronavirus 2 by RT PCR: NEGATIVE

## 2023-02-01 LAB — TROPONIN I (HIGH SENSITIVITY): Troponin I (High Sensitivity): <2 ng/L (ref <18)

## 2023-02-01 NOTE — ED Provider Notes (Signed)
[place] [facility] Provider Note   CSN: [id number] Arrival date & time: 02/01/23  1243     History  Chief Complaint  Patient presents with   Chest Pain    Der [name] is a 32 y.o. female.  Patient here after episode of some chest discomfort.  Patient on Eliquis, Lipitor.  History of PTSD, depression, asthma, thrombosis of the right atrium in 2014, PFO, TIA, clotting disorder.  She had a recent illness.  But feels little bit better now.  Not much of a cough or sputum production.  Asymptomatic now.  Denies any weakness numbness tingling.  Had brief chest pain that radiated to left arm but resolved.  Make sure she was okay.  The history is provided by the patient.       Home Medications Prior to Admission medications   Medication Sig Start Date End Date Taking? Authorizing Provider  alum & mag hydroxide-simeth (MAALOX/MYLANTA) 200-200-20 MG/5ML suspension Take by mouth every 6 (six) hours as needed for indigestion or heartburn.    [provider]  apixaban (ELIQUIS) 5 MG TABS tablet Take 1 tablet (5 mg total) by mouth 2 (two) times daily. 03/26/17   Reyne Dumas, MD  atorvastatin (LIPITOR) 10 MG tablet Take 1 tablet (10 mg total) by mouth at bedtime. 09/17/22   Hali Marry, MD  cholecalciferol (VITAMIN D3) 25 MCG (1000 UNIT) tablet Take 1,000 Units by mouth daily.    [provider]  ferrous sulfate 325 (65 FE) MG EC tablet Take 325 mg by mouth daily with breakfast.    [provider]  ondansetron (ZOFRAN-ODT) 4 MG disintegrating tablet Take 4 mg by mouth 2 (two) times daily as needed. 03/05/22   [provider]  oxyCODONE-acetaminophen (PERCOCET) 10-325 MG tablet Take 1 tablet by mouth in the morning and at bedtime.    [provider]  rizatriptan (MAXALT-MLT) 10 MG disintegrating tablet Take 1 tablet (10 mg total) by mouth as needed for migraine. May repeat in 2 hours if needed  04/04/22   Hali Marry, MD      Allergies    Fluoxetine, Tramadol, Ceftriaxone, Gabapentin, Levaquin [levofloxacin], Morphine and related, Nsaids, Topamax [topiramate], Toradol [ketorolac tromethamine], Vicodin [hydrocodone-acetaminophen], Depakote [divalproex sodium], Methocarbamol, and Metoclopramide    Review of Systems   Review of Systems  Physical Exam Updated Vital Signs BP 119/76 (BP Location: Left Arm)   Pulse 99   Temp 98.6 F (37 C) (Oral)   Resp 18   Ht '5\' 3"'$  (1.6 m)   Wt 103 kg   SpO2 97%   BMI 40.21 kg/m  Physical Exam Vitals and nursing note reviewed.  Constitutional:      General: She is not in acute distress.    Appearance: She is well-developed. She is not ill-appearing.  HENT:     Head: Normocephalic and atraumatic.  Eyes:     Extraocular Movements: Extraocular movements intact.     Conjunctiva/sclera: Conjunctivae normal.     Pupils: Pupils are equal, round, and reactive to light.  Cardiovascular:     Rate and Rhythm: Normal rate and regular rhythm.     Pulses:          Radial pulses are 2+ on the right side and 2+ on the left side.     Heart sounds: Normal heart sounds. No murmur heard. Pulmonary:     Effort: Pulmonary effort is normal. No respiratory distress.     Breath sounds: Normal  breath sounds.  Abdominal:     Palpations: Abdomen is soft.     Tenderness: There is no abdominal tenderness.  Musculoskeletal:        General: No swelling. Normal range of motion.     Cervical back: Normal range of motion and neck supple.     Right lower leg: No edema.     Left lower leg: No edema.  Skin:    General: Skin is warm and dry.     Capillary Refill: Capillary refill takes less than 2 seconds.  Neurological:     Mental Status: She is alert.  Psychiatric:        Mood and Affect: Mood normal.     ED Results / Procedures / Treatments   Labs (all labs ordered are listed, but only abnormal results are displayed) Labs Reviewed  CBC WITH  DIFFERENTIAL/PLATELET - Abnormal; Notable for the following components:      Result Value   RBC 5.13 (*)    Platelets 414 (*)    All other components within normal limits  BASIC METABOLIC PANEL - Abnormal; Notable for the following components:   Calcium 8.7 (*)    All other components within normal limits  TROPONIN I (HIGH SENSITIVITY)    EKG EKG Interpretation  Date/Time:  Saturday February 01 2023 12:50:03 EST Ventricular Rate:  97 PR Interval:  142 QRS Duration: 78 QT Interval:  353 QTC Calculation: 449 R Axis:   53 Text Interpretation: Sinus rhythm Confirmed by Lennice Sites ([id number]) on 02/01/2023 12:54:26 PM  Radiology DG Chest Portable 1 View  Result Date: 02/01/2023 CLINICAL DATA:  Intermittent LEFT-sided chest pain. EXAM: PORTABLE CHEST 1 VIEW COMPARISON:  Chest x-ray dated 05/05/2022 FINDINGS: Heart size and mediastinal contours are within normal limits. Median sternotomy wires appear intact and stable in alignment. Lungs are clear. No pleural effusion or pneumothorax is seen. No acute-appearing osseous abnormality. IMPRESSION: No active disease. No evidence of pneumonia or pulmonary edema. Electronically Signed   By: Franki Cabot M.D.   On: 02/01/2023 13:41    Procedures Procedures    Medications Ordered in ED Medications - No data to display  ED Course/ Medical Decision Making/ A&P                             Medical Decision Making Amount and/or Complexity of Data Reviewed Labs: ordered. Radiology: ordered.   [name] is here with chest pain.  Normal vitals.  No fever.  Asymptomatic now.  Had a nonspecific feeling of some chest discomfort.  Recent viral symptoms.  Per chart review she had a sick visit 2 days ago endorsing some flulike symptoms.  Took a COVID test supposed that was negative.  Was supposed to be prescribed Tamiflu but does not like she got it filled.  She has a history of PTSD, depression, thrombosis in her right  atrium/PFO/TIA/clotting disorder and is on Eliquis.  She has had normal echocardiograms within the last year.  I have no concern that there is any acute thrombosis or PE going on.  She has an EKG that shows sinus rhythm.  No ischemic changes.  She is appears comfortable.  She has normal vitals.  No tachycardia or hypoxia.  She is on anticoagulation.  Will get COVID and flu test and will get chest x-ray.  Suspicion is that she is having some postviral bronchitis type process.  Doubt PE or ACS, will check basic labs and  troponin.  Per my review and interpretation of labs no significant anemia or electrolyte abnormality or kidney injury.  Troponin is normal.  Chest x-ray shows no evidence of pneumonia or pneumothorax.  She refuses COVID and flu testing.  Overall nonspecific symptoms.  Given reassurance and discharged from the ED in good condition.  This chart was dictated using voice recognition software.  Despite best efforts to proofread,  errors can occur which can change the documentation meaning.         Final Clinical Impression(s) / ED Diagnoses Final diagnoses:  Nonspecific chest pain    Rx / DC Orders ED Discharge Orders     None         Lennice Sites, DO 02/01/23 1415

## 2023-02-01 NOTE — ED Notes (Signed)
Patient refused covid swab

## 2023-02-01 NOTE — ED Triage Notes (Signed)
Pt reports intermittent L side chest pain for the past few days. Says when she has chest pain she also feels lightheaded and like "the air got knocked out of my chest." Says she is having chest pain right now. Pt speaking in full sentences. Ambulatory to triage.

## 2023-02-03 ENCOUNTER — Telehealth: Payer: Self-pay | Admitting: General Practice

## 2023-02-03 NOTE — Transitions of Care (Post Inpatient/ED Visit) (Signed)
   02/03/2023  Name: [name] MRN: [id number] DOB: 29-Sep-1991  Today's TOC FU Call Status: Today's TOC FU Call Status:: Successful TOC FU Call Competed TOC FU Call Complete Date: 02/03/23  Transition Care Management Follow-up Telephone Call Date of Discharge: 02/01/23 Discharge Facility: [facility] Type of Discharge: Emergency Department Reason for ED Visit: Cardiac Conditions Cardiac Conditions Diagnosis:  (non specific chest pain) How have you been since you were released from the hospital?: Same Any questions or concerns?: No  Items Reviewed: Did you receive and understand the discharge instructions provided?: Yes Medications obtained and verified?: Yes (Medications Reviewed) Any new allergies since your discharge?: No Dietary orders reviewed?: No Do you have support at home?: No  Home Care and Equipment/Supplies: [place] Ordered?: No Any new equipment or medical supplies ordered?: No  Functional Questionnaire: Do you need assistance with bathing/showering or dressing?: No Do you need assistance with meal preparation?: No Do you need assistance with eating?: No Do you have difficulty maintaining continence: No Do you need assistance with getting out of bed/getting out of a chair/moving?: No Do you have difficulty managing or taking your medications?: No  Folllow up appointments reviewed: PCP Follow-up appointment confirmed?: Yes Date of PCP follow-up appointment?: 02/04/23 Follow-up Provider: Dr. Madilyn Fireman Specialist [facility] Follow-up appointment confirmed?: NA Do you need transportation to your follow-up appointment?: No Do you understand care options if your condition(s) worsen?: Yes-patient verbalized understanding    SIGNATURE: Tinnie Gens RN BSN

## 2023-02-04 ENCOUNTER — Telehealth (INDEPENDENT_AMBULATORY_CARE_PROVIDER_SITE_OTHER): Payer: Medicaid Other | Admitting: Family Medicine

## 2023-02-04 ENCOUNTER — Ambulatory Visit: Payer: Medicaid Other | Admitting: Family Medicine

## 2023-02-04 ENCOUNTER — Encounter: Payer: Self-pay | Admitting: Family Medicine

## 2023-02-04 DIAGNOSIS — R0602 Shortness of breath: Secondary | ICD-10-CM

## 2023-02-04 DIAGNOSIS — D689 Coagulation defect, unspecified: Secondary | ICD-10-CM

## 2023-02-04 DIAGNOSIS — R0789 Other chest pain: Secondary | ICD-10-CM | POA: Diagnosis not present

## 2023-02-04 NOTE — Progress Notes (Signed)
Established Patient Office Visit  Subjective   Patient ID: [name], female    DOB: 13-Jan-1991  Age: 32 y.o. MRN: [id number]  No chief complaint on file.   HPI Here for follow-up from recent emergency department visit on 02/01/23 for atypical chest pain.  She does take Eliquis for history of clotting disorder with prior history of thrombosis.  She has not been consistent with the Eliquis she has been missing some doses here there.  She has a prior history of thrombosis in the right atrium back in 2014.  She had COVID last month but felt like she completely bounced back from that she did not have any residual symptoms.  She feels like the chest pain is like a tightness and when it happens she feels short of breath she says it hits suddenly.  She has not noticed any wheezing.  No fevers or chills.  She also has a history of low iron and has been taking her iron supplement.  Chest x-ray was performed and was negative.  Negative for flu and COVID.  Normal troponin.  Calcium level was a little low similar to what it has been in the past.  Normal white blood cell count.     ROS    Objective:     There were no vitals taken for this visit.   Physical Exam Vitals and nursing note reviewed.  Constitutional:      Appearance: She is well-developed.  HENT:     Head: Normocephalic and atraumatic.  Cardiovascular:     Rate and Rhythm: Normal rate and regular rhythm.     Heart sounds: Normal heart sounds.  Pulmonary:     Effort: Pulmonary effort is normal.     Breath sounds: Normal breath sounds.  Skin:    General: Skin is warm and dry.  Neurological:     Mental Status: She is alert and oriented to person, place, and time.  Psychiatric:        Behavior: Behavior normal.     No results found for any visits on 02/04/23.    The ASCVD Risk score (Arnett DK, et al., 2019) failed to calculate for the following reasons:   The 2019 ASCVD risk score is only valid for ages 28 to  65    Assessment & Plan:   Problem List Items Addressed This Visit       Hematopoietic and Hemostatic   Clotting disorder [facility])    Discussed with her the short half-life of Eliquis and to make sure that she is taking it as consistently as possible.  Sometimes she just gets busy at work and cannot take it right then.  She has been trying to use an reminder app to assist her.      Other Visit Diagnoses     SOB (shortness of breath)    -  Primary   Relevant Orders   D-dimer, quantitative   Atypical chest pain       Relevant Orders   D-dimer, quantitative      Shortness of breath with atypical chest pain-she is most worried about having another clot especially since she has been missing some doses of her Eliquis.  I do not think it would be unreasonable to check a D-dimer.  If negative then that is reassuring.  If it is positive then we will need to do further workup.  Atypical chest pain - will check stat d-dimer.  She is not feeling tremendously better in regards to  the chest pain.  Consider chest wall pain or more musculoskeletal.  Pain worsens please let us know.  No follow-ups on file.    Beatrice Lecher, MD

## 2023-02-04 NOTE — Progress Notes (Signed)
Pt reports that she was having palpitations that was waking her up from sleep. She went to the ED   Pt did miss a few doses of her Eliquis she admits that she has done this before. She would like to have her D-dimer checked.

## 2023-02-04 NOTE — Assessment & Plan Note (Signed)
Discussed with her the short half-life of Eliquis and to make sure that she is taking it as consistently as possible.  Sometimes she just gets busy at work and cannot take it right then.  She has been trying to use an reminder app to assist her.

## 2023-02-05 DIAGNOSIS — R0602 Shortness of breath: Secondary | ICD-10-CM | POA: Diagnosis not present

## 2023-02-05 DIAGNOSIS — R0789 Other chest pain: Secondary | ICD-10-CM | POA: Diagnosis not present

## 2023-02-05 LAB — D-DIMER, QUANTITATIVE: D-Dimer, Quant: <0.19 mcg/mL FEU (ref <0.50)

## 2023-02-06 NOTE — Progress Notes (Signed)
Great news!! No sign of blood clot

## 2023-02-22 DIAGNOSIS — M545 Low back pain, unspecified: Secondary | ICD-10-CM | POA: Diagnosis not present

## 2023-02-22 DIAGNOSIS — Z79899 Other long term (current) drug therapy: Secondary | ICD-10-CM | POA: Diagnosis not present

## 2023-02-22 DIAGNOSIS — E7800 Pure hypercholesterolemia, unspecified: Secondary | ICD-10-CM | POA: Diagnosis not present

## 2023-02-22 DIAGNOSIS — N9489 Other specified conditions associated with female genital organs and menstrual cycle: Secondary | ICD-10-CM | POA: Diagnosis not present

## 2023-02-22 DIAGNOSIS — Z6841 Body Mass Index (BMI) 40.0 and over, adult: Secondary | ICD-10-CM | POA: Diagnosis not present

## 2023-02-22 DIAGNOSIS — D689 Coagulation defect, unspecified: Secondary | ICD-10-CM | POA: Diagnosis not present

## 2023-02-22 DIAGNOSIS — R200 Anesthesia of skin: Secondary | ICD-10-CM | POA: Diagnosis not present

## 2023-02-22 DIAGNOSIS — M542 Cervicalgia: Secondary | ICD-10-CM | POA: Diagnosis not present

## 2023-02-22 DIAGNOSIS — F17210 Nicotine dependence, cigarettes, uncomplicated: Secondary | ICD-10-CM | POA: Diagnosis not present

## 2023-02-22 DIAGNOSIS — N809 Endometriosis, unspecified: Secondary | ICD-10-CM | POA: Diagnosis not present

## 2023-02-22 DIAGNOSIS — N915 Oligomenorrhea, unspecified: Secondary | ICD-10-CM | POA: Diagnosis not present

## 2023-02-22 DIAGNOSIS — R12 Heartburn: Secondary | ICD-10-CM | POA: Diagnosis not present

## 2023-02-22 DIAGNOSIS — E559 Vitamin D deficiency, unspecified: Secondary | ICD-10-CM | POA: Diagnosis not present

## 2023-02-22 DIAGNOSIS — D539 Nutritional anemia, unspecified: Secondary | ICD-10-CM | POA: Diagnosis not present

## 2023-02-28 DIAGNOSIS — Z79899 Other long term (current) drug therapy: Secondary | ICD-10-CM | POA: Diagnosis not present

## 2023-03-03 ENCOUNTER — Telehealth: Payer: Self-pay | Admitting: General Practice

## 2023-03-03 NOTE — Transitions of Care (Post Inpatient/ED Visit) (Signed)
   03/03/2023  Name: [name] MRN: [id number] DOB: 1991/09/18  Today's TOC FU Call Status: Today's TOC FU Call Status:: Unsuccessul Call (1st Attempt) Unsuccessful Call (1st Attempt) Date: 03/03/23  Attempted to reach the patient regarding the most recent Inpatient/ED visit.  Follow Up Plan: Additional outreach attempts will be made to reach the patient to complete the Transitions of Care (Post Inpatient/ED visit) call.   Signature Tinnie Gens, RN BSN

## 2023-03-06 NOTE — Transitions of Care (Post Inpatient/ED Visit) (Signed)
   03/06/2023  Name: [name] MRN: [id number] DOB: April 05, 1991  Today's TOC FU Call Status: Today's TOC FU Call Status:: Unsuccessful Call (2nd Attempt) Unsuccessful Call (1st Attempt) Date: 03/03/23 Unsuccessful Call (2nd Attempt) Date: 03/06/23  Attempted to reach the patient regarding the most recent Inpatient/ED visit.  Follow Up Plan: Additional outreach attempts will be made to reach the patient to complete the Transitions of Care (Post Inpatient/ED visit) call.   Signature Tinnie Gens, RN BSN

## 2023-03-10 NOTE — Transitions of Care (Post Inpatient/ED Visit) (Signed)
   03/10/2023  Name: [name] MRN: [id number] DOB: 07/02/1991  Today's TOC FU Call Status: Today's TOC FU Call Status:: Unsuccessful Call (3rd Attempt) Unsuccessful Call (1st Attempt) Date: 03/03/23 Unsuccessful Call (2nd Attempt) Date: 03/06/23 Unsuccessful Call (3rd Attempt) Date: 03/10/23  Attempted to reach the patient regarding the most recent Inpatient/ED visit.  Follow Up Plan: No further outreach attempts will be made at this time. We have been unable to contact the patient.  Signature Tinnie Gens, RN BSN

## 2023-03-12 ENCOUNTER — Ambulatory Visit: Payer: Medicaid Other | Admitting: Family Medicine

## 2023-03-12 NOTE — Progress Notes (Deleted)
   Acute Office Visit  Subjective:     Patient ID: [name], female    DOB: Nov 11, 1991, 32 y.o.   MRN: [id number]  No chief complaint on file.   HPI Patient is in today for ***  ROS      Objective:    There were no vitals taken for this visit. {Vitals History (Optional):23777}  Physical Exam  No results found for any visits on 03/12/23.      Assessment & Plan:   Problem List Items Addressed This Visit   None   No orders of the defined types were placed in this encounter.   No follow-ups on file.  Beatrice Lecher, MD

## 2023-03-24 ENCOUNTER — Encounter: Payer: Self-pay | Admitting: *Deleted

## 2023-03-25 DIAGNOSIS — D689 Coagulation defect, unspecified: Secondary | ICD-10-CM | POA: Diagnosis not present

## 2023-03-25 DIAGNOSIS — N809 Endometriosis, unspecified: Secondary | ICD-10-CM | POA: Diagnosis not present

## 2023-03-25 DIAGNOSIS — R12 Heartburn: Secondary | ICD-10-CM | POA: Diagnosis not present

## 2023-03-25 DIAGNOSIS — R200 Anesthesia of skin: Secondary | ICD-10-CM | POA: Diagnosis not present

## 2023-03-25 DIAGNOSIS — F17210 Nicotine dependence, cigarettes, uncomplicated: Secondary | ICD-10-CM | POA: Diagnosis not present

## 2023-03-25 DIAGNOSIS — N915 Oligomenorrhea, unspecified: Secondary | ICD-10-CM | POA: Diagnosis not present

## 2023-03-25 DIAGNOSIS — N9489 Other specified conditions associated with female genital organs and menstrual cycle: Secondary | ICD-10-CM | POA: Diagnosis not present

## 2023-03-25 DIAGNOSIS — Z6841 Body Mass Index (BMI) 40.0 and over, adult: Secondary | ICD-10-CM | POA: Diagnosis not present

## 2023-03-25 DIAGNOSIS — M545 Low back pain, unspecified: Secondary | ICD-10-CM | POA: Diagnosis not present

## 2023-03-25 DIAGNOSIS — E7800 Pure hypercholesterolemia, unspecified: Secondary | ICD-10-CM | POA: Diagnosis not present

## 2023-03-25 DIAGNOSIS — M542 Cervicalgia: Secondary | ICD-10-CM | POA: Diagnosis not present

## 2023-03-25 DIAGNOSIS — Z79899 Other long term (current) drug therapy: Secondary | ICD-10-CM | POA: Diagnosis not present

## 2023-03-28 DIAGNOSIS — Z79899 Other long term (current) drug therapy: Secondary | ICD-10-CM | POA: Diagnosis not present

## 2023-04-02 ENCOUNTER — Ambulatory Visit: Payer: Medicaid Other | Admitting: Family Medicine

## 2023-04-02 ENCOUNTER — Encounter: Payer: Self-pay | Admitting: Family Medicine

## 2023-04-02 VITALS — BP 124/69 | HR 63 | Ht 63.0 in | Wt 237.0 lb

## 2023-04-02 DIAGNOSIS — R21 Rash and other nonspecific skin eruption: Secondary | ICD-10-CM

## 2023-04-02 DIAGNOSIS — R051 Acute cough: Secondary | ICD-10-CM | POA: Diagnosis not present

## 2023-04-02 DIAGNOSIS — R0789 Other chest pain: Secondary | ICD-10-CM | POA: Diagnosis not present

## 2023-04-02 DIAGNOSIS — H7291 Unspecified perforation of tympanic membrane, right ear: Secondary | ICD-10-CM | POA: Diagnosis not present

## 2023-04-02 LAB — POCT INFLUENZA A/B
Influenza A, POC: NEGATIVE
Influenza B, POC: NEGATIVE

## 2023-04-02 MED ORDER — NYSTATIN 100000 UNIT/GM EX POWD: 1.0000 | Freq: Two times a day (BID) | CUTANEOUS | 1 refills | Status: DC

## 2023-04-02 NOTE — Patient Instructions (Signed)
Can also use a benzyl peroxide wash on the affected skin area daily and that can help as well.  Not use on any open wounds.

## 2023-04-02 NOTE — Progress Notes (Signed)
Acute Office Visit  Subjective:     Patient ID: [name], female    DOB: 1991-07-04, 32 y.o.   MRN: [id number]  Chief Complaint  Patient presents with   Rash   Chest Pain    HPI Patient is in today for skin issues, she says for months on and off she has had thinning irritated skin just underneath the pannus in the lower abdomen.  She usually just tries to use a barrier ointment and sometimes baby powder.  But sometimes it gets irritated that the skin actually cracks.  Also for about 2 days she just has not felt good she has had some tightness in her chest she has had a sore throat some congestion and just feeling achy.  The lady behind her in class was recently diagnosed with flu and she was coughing.  She also has been vaping and wonders if that could be contributing.  She has had a slight cough.  ROS      Objective:    BP 124/69   Pulse 63   Ht  (1.6 m)   Wt 237 lb (107.5 kg)   SpO2 97%   BMI 41.98 kg/m    Physical Exam Constitutional:      Appearance: She is well-developed.  HENT:     Head: Normocephalic and atraumatic.     Right Ear: Ear canal and external ear normal.     Left Ear: Tympanic membrane, ear canal and external ear normal.     Ears:     Comments: Right TM with small lower ant perforation    Nose: Nose normal.  Eyes:     Conjunctiva/sclera: Conjunctivae normal.     Pupils: Pupils are equal, round, and reactive to light.  Neck:     Thyroid: No thyromegaly.  Cardiovascular:     Rate and Rhythm: Normal rate and regular rhythm.     Heart sounds: Normal heart sounds.  Pulmonary:     Effort: Pulmonary effort is normal.     Breath sounds: Normal breath sounds. No wheezing.  Musculoskeletal:     Cervical back: Neck supple.  Lymphadenopathy:     Cervical: No cervical adenopathy.  Skin:    General: Skin is warm and dry.  Neurological:     Mental Status: She is alert and oriented to person, place, and time.     Results for  orders placed or performed in visit on 04/02/23  POCT Influenza A/B  Result Value Ref Range   Influenza A, POC Negative Negative   Influenza B, POC Negative Negative        Assessment & Plan:   Problem List Items Addressed This Visit   None Visit Diagnoses     Atypical chest pain    -  Primary   Rash       Relevant Medications   nystatin (MYCOSTATIN/NYSTOP) powder   Acute cough       Relevant Orders   POCT Influenza A/B (Completed)   Perforation of ear drum, right       Relevant Orders   Ambulatory referral to ENT      Rash-most consistent with Candida versus erythrasma.  Will have her do a benzyl peroxide wash and start applying nystatin powder.  Recommend full treatment for 2 weeks and see if symptoms resolve.  Then can use as needed over the summer.  Did visualize what looks like a perforation of the right ear.  She is not having any pain or  discharge no recent upper ear or respiratory infections.  Will refer to ENT.  Cough-negative for influenza.  Recommend symptomatic care.  Call if not proving Adeleke.  Chest exam is clear.  Meds ordered this encounter  Medications   nystatin (MYCOSTATIN/NYSTOP) powder    Sig: Apply 1 Application topically 2 (two) times daily.    Dispense:  60 g    Refill:  1    Return if symptoms worsen or fail to improve.  Nani Gasser, MD

## 2023-04-22 DIAGNOSIS — N9489 Other specified conditions associated with female genital organs and menstrual cycle: Secondary | ICD-10-CM | POA: Diagnosis not present

## 2023-04-22 DIAGNOSIS — E7800 Pure hypercholesterolemia, unspecified: Secondary | ICD-10-CM | POA: Diagnosis not present

## 2023-04-22 DIAGNOSIS — Z6841 Body Mass Index (BMI) 40.0 and over, adult: Secondary | ICD-10-CM | POA: Diagnosis not present

## 2023-04-22 DIAGNOSIS — R12 Heartburn: Secondary | ICD-10-CM | POA: Diagnosis not present

## 2023-04-22 DIAGNOSIS — N809 Endometriosis, unspecified: Secondary | ICD-10-CM | POA: Diagnosis not present

## 2023-04-22 DIAGNOSIS — M545 Low back pain, unspecified: Secondary | ICD-10-CM | POA: Diagnosis not present

## 2023-04-22 DIAGNOSIS — N915 Oligomenorrhea, unspecified: Secondary | ICD-10-CM | POA: Diagnosis not present

## 2023-04-22 DIAGNOSIS — M542 Cervicalgia: Secondary | ICD-10-CM | POA: Diagnosis not present

## 2023-04-22 DIAGNOSIS — R200 Anesthesia of skin: Secondary | ICD-10-CM | POA: Diagnosis not present

## 2023-04-22 DIAGNOSIS — D689 Coagulation defect, unspecified: Secondary | ICD-10-CM | POA: Diagnosis not present

## 2023-04-22 DIAGNOSIS — F17210 Nicotine dependence, cigarettes, uncomplicated: Secondary | ICD-10-CM | POA: Diagnosis not present

## 2023-04-22 DIAGNOSIS — Z79899 Other long term (current) drug therapy: Secondary | ICD-10-CM | POA: Diagnosis not present

## 2023-04-28 DIAGNOSIS — Z79899 Other long term (current) drug therapy: Secondary | ICD-10-CM | POA: Diagnosis not present

## 2023-04-29 ENCOUNTER — Encounter: Payer: Self-pay | Admitting: Family Medicine

## 2023-04-29 ENCOUNTER — Telehealth (INDEPENDENT_AMBULATORY_CARE_PROVIDER_SITE_OTHER): Payer: Medicaid Other | Admitting: Family Medicine

## 2023-04-29 DIAGNOSIS — R1013 Epigastric pain: Secondary | ICD-10-CM | POA: Diagnosis not present

## 2023-04-29 DIAGNOSIS — R6881 Early satiety: Secondary | ICD-10-CM | POA: Diagnosis not present

## 2023-04-29 DIAGNOSIS — K219 Gastro-esophageal reflux disease without esophagitis: Secondary | ICD-10-CM | POA: Diagnosis not present

## 2023-04-29 DIAGNOSIS — R140 Abdominal distension (gaseous): Secondary | ICD-10-CM

## 2023-04-29 NOTE — Progress Notes (Signed)
    Virtual Visit via Video Note  I connected with [name] on 04/29/23 at  3:20 PM EDT by a video enabled telemedicine application and verified that I am speaking with the correct person using two identifiers.   I discussed the limitations of evaluation and management by telemedicine and the availability of in person appointments. The patient expressed understanding and agreed to proceed.  Patient location: at car Provider location: in office  Subjective:    CC:  No chief complaint on file.   HPI:  She reports that for about 4 months she has been having fullness and discomfort in her stomach she says she will wake up that way.  It is worse if she tries to eat she gets full very quickly she has been very bloated.  She has had a lot of irregularity in her stools she will have episodes of diarrhea and then go back to normal bowels or maybe even constipation.  She has been having more GERD and gas than usual.  She did try Protonix but did not find it helpful.  So has been using Mylanta twice a day and that does seem to provide temporary relief.  She has a history of cholecystectomy.  No recent vomiting.  She would like to be seen locally by GI if possible.    Past medical history, Surgical history, Family history not pertinant except as noted below, Social history, Allergies, and medications have been entered into the medical record, reviewed, and corrections made.    Objective:    General: Speaking clearly in complete sentences without any shortness of breath.  Alert and oriented x3.  Normal judgment. No apparent acute distress.    Impression and Recommendations:    Problem List Items Addressed This Visit       Digestive   Gastroesophageal reflux disease without esophagitis   Other Visit Diagnoses     Bloating    -  Primary   Relevant Orders   Ambulatory referral to Gastroenterology   Early satiety       Relevant Orders   Ambulatory referral to Gastroenterology    Epigastric pain       Relevant Orders   Ambulatory referral to Gastroenterology       Bloating, early satiety/epigastric pain-discussed GI referral.  Will get her scheduled here locally especially since she is already failed a PPI.  She is also had a history of cholecystectomy.  Encouraged her to call back to schedule 2 step TB test.  Her work will not except a QuantiFERON.  Orders Placed This Encounter  Procedures   Ambulatory referral to Gastroenterology    Referral Priority:   Routine    Referral Type:   Consultation    Referral Reason:   Specialty Services Required    Number of Visits Requested:   1    No orders of the defined types were placed in this encounter.    I discussed the assessment and treatment plan with the patient. The patient was provided an opportunity to ask questions and all were answered. The patient agreed with the plan and demonstrated an understanding of the instructions.   The patient was advised to call back or seek an in-person evaluation if the symptoms worsen or if the condition fails to improve as anticipated.   Nani Gasser, MD

## 2023-04-29 NOTE — Progress Notes (Signed)
Recurrent gas,heartburn, GERD. She would like a referral to GI for this. She is ok with either [facility] or [facility].   Pt also mentioned that she would need a 2 step tb test she was advised to call back and schedule a NV for this.

## 2023-05-12 ENCOUNTER — Ambulatory Visit: Payer: Medicaid Other

## 2023-05-13 ENCOUNTER — Ambulatory Visit (INDEPENDENT_AMBULATORY_CARE_PROVIDER_SITE_OTHER): Payer: Medicaid Other | Admitting: Family Medicine

## 2023-05-13 DIAGNOSIS — Z111 Encounter for screening for respiratory tuberculosis: Secondary | ICD-10-CM

## 2023-05-13 NOTE — Progress Notes (Unsigned)
Agree with documentation as above.   Treysen Sudbeck, MD  

## 2023-05-13 NOTE — Progress Notes (Unsigned)
Patient is here for a PPD reading.   Patient tolerated the injection well on the Right forearm without any complications.  Patient advised to return for PPD reading within 48 - 72 hours.

## 2023-05-14 ENCOUNTER — Ambulatory Visit: Payer: Medicaid Other

## 2023-05-15 ENCOUNTER — Ambulatory Visit (INDEPENDENT_AMBULATORY_CARE_PROVIDER_SITE_OTHER): Payer: Medicaid Other | Admitting: Family Medicine

## 2023-05-15 VITALS — BP 121/71 | HR 88 | Ht 63.0 in | Wt 239.8 lb

## 2023-05-15 DIAGNOSIS — Z111 Encounter for screening for respiratory tuberculosis: Secondary | ICD-10-CM | POA: Diagnosis not present

## 2023-05-15 LAB — TB SKIN TEST
Induration: 0 mm
TB Skin Test: NEGATIVE

## 2023-05-15 NOTE — Progress Notes (Signed)
   Established Patient Office Visit  Subjective   Patient ID: [name], female    DOB: 10/15/1991  Age: 32 y.o. MRN: [id number]  Chief Complaint  Patient presents with   PPD Reading    Nurse visit PPD read    HPI  PPD reading - PPD placed on 05/13/23. Patient within acceptable window for PPD read.   ROS    Objective:     BP 121/71   Pulse 88   Ht 5\' 3"  (1.6 m)   Wt 239 lb 12 oz (108.7 kg)   SpO2 98%   BMI 42.47 kg/m    Physical Exam   No results found for any visits on 05/15/23.    The ASCVD Risk score (Arnett DK, et al., 2019) failed to calculate for the following reasons:   The 2019 ASCVD risk score is only valid for ages 52 to 22   The patient has a prior MI or stroke diagnosis    Assessment & Plan:  PPD read results= no redness, no induration, 0mm. Patient given letter of negative result to give to employer.  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

## 2023-05-15 NOTE — Progress Notes (Signed)
Agree with documentation as above.   Barbara Pascarella, MD  

## 2023-05-16 DIAGNOSIS — R102 Pelvic and perineal pain: Secondary | ICD-10-CM | POA: Diagnosis not present

## 2023-05-16 DIAGNOSIS — Z0000 Encounter for general adult medical examination without abnormal findings: Secondary | ICD-10-CM | POA: Diagnosis not present

## 2023-05-16 DIAGNOSIS — Z1159 Encounter for screening for other viral diseases: Secondary | ICD-10-CM | POA: Diagnosis not present

## 2023-05-16 DIAGNOSIS — Z113 Encounter for screening for infections with a predominantly sexual mode of transmission: Secondary | ICD-10-CM | POA: Diagnosis not present

## 2023-05-16 DIAGNOSIS — Z01419 Encounter for gynecological examination (general) (routine) without abnormal findings: Secondary | ICD-10-CM | POA: Diagnosis not present

## 2023-05-16 LAB — HM PAP SMEAR: HM Pap smear: NEGATIVE

## 2023-05-21 DIAGNOSIS — N915 Oligomenorrhea, unspecified: Secondary | ICD-10-CM | POA: Diagnosis not present

## 2023-05-21 DIAGNOSIS — N9489 Other specified conditions associated with female genital organs and menstrual cycle: Secondary | ICD-10-CM | POA: Diagnosis not present

## 2023-05-21 DIAGNOSIS — Z6841 Body Mass Index (BMI) 40.0 and over, adult: Secondary | ICD-10-CM | POA: Diagnosis not present

## 2023-05-21 DIAGNOSIS — Z79899 Other long term (current) drug therapy: Secondary | ICD-10-CM | POA: Diagnosis not present

## 2023-05-21 DIAGNOSIS — M542 Cervicalgia: Secondary | ICD-10-CM | POA: Diagnosis not present

## 2023-05-21 DIAGNOSIS — R12 Heartburn: Secondary | ICD-10-CM | POA: Diagnosis not present

## 2023-05-21 DIAGNOSIS — N809 Endometriosis, unspecified: Secondary | ICD-10-CM | POA: Diagnosis not present

## 2023-05-21 DIAGNOSIS — M545 Low back pain, unspecified: Secondary | ICD-10-CM | POA: Diagnosis not present

## 2023-05-21 DIAGNOSIS — E7800 Pure hypercholesterolemia, unspecified: Secondary | ICD-10-CM | POA: Diagnosis not present

## 2023-05-21 DIAGNOSIS — F17210 Nicotine dependence, cigarettes, uncomplicated: Secondary | ICD-10-CM | POA: Diagnosis not present

## 2023-05-21 DIAGNOSIS — R200 Anesthesia of skin: Secondary | ICD-10-CM | POA: Diagnosis not present

## 2023-05-21 DIAGNOSIS — D689 Coagulation defect, unspecified: Secondary | ICD-10-CM | POA: Diagnosis not present

## 2023-05-26 DIAGNOSIS — Z79899 Other long term (current) drug therapy: Secondary | ICD-10-CM | POA: Diagnosis not present

## 2023-06-03 DIAGNOSIS — S39012D Strain of muscle, fascia and tendon of lower back, subsequent encounter: Secondary | ICD-10-CM | POA: Insufficient documentation

## 2023-06-10 ENCOUNTER — Emergency Department (HOSPITAL_COMMUNITY)
Admission: EM | Admit: 2023-06-10 | Discharge: 2023-06-11 | Discharge status: Against Medical Advice | Payer: Medicaid Other | Attending: Emergency Medicine | Admitting: Emergency Medicine

## 2023-06-10 ENCOUNTER — Emergency Department (HOSPITAL_COMMUNITY): Payer: Medicaid Other

## 2023-06-10 ENCOUNTER — Encounter (HOSPITAL_COMMUNITY): Payer: Self-pay

## 2023-06-10 DIAGNOSIS — R079 Chest pain, unspecified: Secondary | ICD-10-CM | POA: Diagnosis not present

## 2023-06-10 DIAGNOSIS — R109 Unspecified abdominal pain: Secondary | ICD-10-CM | POA: Insufficient documentation

## 2023-06-10 DIAGNOSIS — Z5321 Procedure and treatment not carried out due to patient leaving prior to being seen by health care provider: Secondary | ICD-10-CM | POA: Diagnosis not present

## 2023-06-10 LAB — URINALYSIS, ROUTINE W REFLEX MICROSCOPIC
Bilirubin Urine: NEGATIVE
Glucose, UA: NEGATIVE mg/dL
Hgb urine dipstick: NEGATIVE
Ketones, ur: NEGATIVE mg/dL
Nitrite: NEGATIVE
Protein, ur: NEGATIVE mg/dL
Specific Gravity, Urine: 1.011 (ref 1.005–1.030)
pH: 6 (ref 5.0–8.0)

## 2023-06-10 LAB — CBC
HCT: 42.3 % (ref 36.0–46.0)
Hemoglobin: 13.9 g/dL (ref 12.0–15.0)
MCH: 29 pg (ref 26.0–34.0)
MCHC: 32.9 g/dL (ref 30.0–36.0)
MCV: 88.1 fL (ref 80.0–100.0)
Platelets: 461 10*3/uL — ABNORMAL HIGH (ref 150–400)
RBC: 4.8 MIL/uL (ref 3.87–5.11)
RDW: 12.2 % (ref 11.5–15.5)
WBC: 7.9 10*3/uL (ref 4.0–10.5)
nRBC: 0 % (ref 0.0–0.2)

## 2023-06-10 LAB — BASIC METABOLIC PANEL
Anion gap: 8 (ref 5–15)
BUN: 7 mg/dL (ref 6–20)
CO2: 25 mmol/L (ref 22–32)
Calcium: 9 mg/dL (ref 8.9–10.3)
Chloride: 104 mmol/L (ref 98–111)
Creatinine, Ser: 0.76 mg/dL (ref 0.44–1.00)
GFR, Estimated: >60 mL/min (ref >60)
Glucose, Bld: 111 mg/dL — ABNORMAL HIGH (ref 70–99)
Potassium: 3.6 mmol/L (ref 3.5–5.1)
Sodium: 137 mmol/L (ref 135–145)

## 2023-06-10 LAB — HCG, SERUM, QUALITATIVE: Preg, Serum: NEGATIVE

## 2023-06-10 LAB — TROPONIN I (HIGH SENSITIVITY): Troponin I (High Sensitivity): 3 ng/L (ref <18)

## 2023-06-10 NOTE — ED Notes (Signed)
Pt stated to this tech she is leaving as she has to be at work early in the morning. Stated she will come back if her symptoms get worse.

## 2023-06-10 NOTE — ED Triage Notes (Signed)
Pt to ED c/o intermittent chest pain x 3 days, reports no relieving or aggravating factors. Pt also c/o left flank pain, reports odorous urine, but denies urinary burning and frequency.

## 2023-06-11 ENCOUNTER — Telehealth: Payer: Self-pay | Admitting: General Practice

## 2023-06-11 NOTE — Transitions of Care (Post Inpatient/ED Visit) (Signed)
   06/11/2023  Name: [name] MRN: [id number] DOB: November 09, 1991  Today's TOC FU Call Status: Today's TOC FU Call Status:: Successful TOC FU Call Competed TOC FU Call Complete Date: 06/11/23  Transition Care Management Follow-up Telephone Call Date of Discharge: 06/11/23 Discharge Facility: [facility]) Type of Discharge: Emergency Department Reason for ED Visit: Other: (flank pain) How have you been since you were released from the hospital?: Worse Any questions or concerns?: No  Items Reviewed: Did you receive and understand the discharge instructions provided?: Yes Medications obtained,verified, and reconciled?: Yes (Medications Reviewed) Any new allergies since your discharge?: No Dietary orders reviewed?: NA Do you have support at home?: No  Medications Reviewed Today: Medications Reviewed Today     Reviewed by Modesto Charon, RN (Registered Nurse) on 06/11/23 at 0815  Med List Status: <None>   Medication Order Taking? Sig Documenting Provider Last Dose Status Informant  alum & mag hydroxide-simeth (MAALOX/MYLANTA) 200-200-20 MG/5ML suspension [id number] No Take by mouth every 6 (six) hours as needed for indigestion or heartburn. [provider] Taking Active   apixaban (ELIQUIS) 5 MG TABS tablet [id number] No Take 1 tablet (5 mg total) by mouth 2 (two) times daily. Richarda Overlie, MD Taking Active Self           Med Note Harrington Challenger Jan 14, 2022  2:51 PM)    cholecalciferol (VITAMIN D3) 25 MCG (1000 UNIT) tablet [id number] No Take 5,000 Units by mouth daily. [provider] Taking Active   ferrous sulfate 325 (65 FE) MG EC tablet [id number] No Take 325 mg by mouth daily with breakfast. [provider] Taking Active   nystatin (MYCOSTATIN/NYSTOP) powder [id number] No Apply 1 Application topically 2 (two) times daily. Agapito Games, MD Taking Active   ondansetron (ZOFRAN-ODT) 4 MG disintegrating tablet [id number] No Take 4 mg  by mouth 2 (two) times daily as needed. [provider] Taking Active   oxyCODONE-acetaminophen (PERCOCET) 10-325 MG tablet [id number] No Take 1 tablet by mouth in the morning and at bedtime. [provider] Taking Active   rizatriptan (MAXALT-MLT) 10 MG disintegrating tablet [id number] No Take 1 tablet (10 mg total) by mouth as needed for migraine. May repeat in 2 hours if needed Agapito Games, MD Taking Active             Home Care and Equipment/Supplies: Were Home Health Services Ordered?: NA Any new equipment or medical supplies ordered?: NA  Functional Questionnaire: Do you need assistance with bathing/showering or dressing?: No Do you need assistance with meal preparation?: No Do you need assistance with eating?: No Do you have difficulty maintaining continence: No Do you need assistance with getting out of bed/getting out of a chair/moving?: No  Follow up appointments reviewed: PCP Follow-up appointment confirmed?:  (Offered patient appointment for 950 am today (06/11/23) with PCP; Patient declined and stated that she is not able to leave work and 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

## 2023-06-12 DIAGNOSIS — N390 Urinary tract infection, site not specified: Secondary | ICD-10-CM | POA: Diagnosis not present

## 2023-06-12 DIAGNOSIS — Z9049 Acquired absence of other specified parts of digestive tract: Secondary | ICD-10-CM | POA: Diagnosis not present

## 2023-06-12 DIAGNOSIS — R140 Abdominal distension (gaseous): Secondary | ICD-10-CM | POA: Diagnosis not present

## 2023-06-12 DIAGNOSIS — K5730 Diverticulosis of large intestine without perforation or abscess without bleeding: Secondary | ICD-10-CM | POA: Diagnosis not present

## 2023-06-12 DIAGNOSIS — N3289 Other specified disorders of bladder: Secondary | ICD-10-CM | POA: Diagnosis not present

## 2023-06-12 DIAGNOSIS — F17210 Nicotine dependence, cigarettes, uncomplicated: Secondary | ICD-10-CM | POA: Diagnosis not present

## 2023-06-12 DIAGNOSIS — R110 Nausea: Secondary | ICD-10-CM | POA: Diagnosis not present

## 2023-06-14 DIAGNOSIS — Z8709 Personal history of other diseases of the respiratory system: Secondary | ICD-10-CM | POA: Diagnosis not present

## 2023-06-14 DIAGNOSIS — R55 Syncope and collapse: Secondary | ICD-10-CM | POA: Diagnosis not present

## 2023-06-14 DIAGNOSIS — Z7901 Long term (current) use of anticoagulants: Secondary | ICD-10-CM | POA: Diagnosis not present

## 2023-06-14 DIAGNOSIS — Z86718 Personal history of other venous thrombosis and embolism: Secondary | ICD-10-CM | POA: Diagnosis not present

## 2023-06-14 DIAGNOSIS — Z8673 Personal history of transient ischemic attack (TIA), and cerebral infarction without residual deficits: Secondary | ICD-10-CM | POA: Diagnosis not present

## 2023-06-14 DIAGNOSIS — R42 Dizziness and giddiness: Secondary | ICD-10-CM | POA: Diagnosis not present

## 2023-06-14 DIAGNOSIS — F17210 Nicotine dependence, cigarettes, uncomplicated: Secondary | ICD-10-CM | POA: Diagnosis not present

## 2023-06-14 DIAGNOSIS — R0789 Other chest pain: Secondary | ICD-10-CM | POA: Diagnosis not present

## 2023-06-14 DIAGNOSIS — R069 Unspecified abnormalities of breathing: Secondary | ICD-10-CM | POA: Diagnosis not present

## 2023-06-16 ENCOUNTER — Telehealth: Payer: Self-pay | Admitting: General Practice

## 2023-06-16 NOTE — Transitions of Care (Post Inpatient/ED Visit) (Signed)
   06/16/2023  Name: [name] MRN: [id number] DOB: [birth year]  Today's TOC FU Call Status: Today's TOC FU Call Status:: Successful TOC FU Call Competed TOC FU Call Complete Date: 06/16/23  Transition Care Management Follow-up Telephone Call Date of Discharge: 06/14/23 Discharge Facility: Other ([facility]) Name of Other ([facility]) Discharge Facility: [facility] Type of Discharge: Emergency Department Reason for ED Visit: Other: (syncope) How have you been since you were released from the hospital?: Same Any questions or concerns?: No  Items Reviewed: Did you receive and understand the discharge instructions provided?: Yes Medications obtained,verified, and reconciled?: Yes (Medications Reviewed) Any new allergies since your discharge?: No Dietary orders reviewed?: NA Do you have support at home?: Yes  Medications Reviewed Today: Medications Reviewed Today     Reviewed by Modesto Charon, RN (Registered Nurse) on 06/16/23 at 1041  Med List Status: <None>   Medication Order Taking? Sig Documenting Provider Last Dose Status Informant  alum & mag hydroxide-simeth (MAALOX/MYLANTA) 200-200-20 MG/5ML suspension [id number]  Take by mouth every 6 (six) hours as needed for indigestion or heartburn. [provider]  Active   apixaban (ELIQUIS) 5 MG TABS tablet [id number]  Take 1 tablet (5 mg total) by mouth 2 (two) times daily. Richarda Overlie, MD  Active Self           Med Note Harrington Challenger Jan 14, 2022  2:51 PM)    cephALEXin (KEFLEX) 500 MG capsule [id number]  Take 500 mg by mouth 3 (three) times daily. [provider]  Active   cholecalciferol (VITAMIN D3) 25 MCG (1000 UNIT) tablet [id number]  Take 5,000 Units by mouth daily. [provider]  Active   ferrous sulfate 325 (65 FE) MG EC tablet [id number]  Take 325 mg by mouth daily with breakfast. [provider]  Active   nystatin (MYCOSTATIN/NYSTOP) powder [id number]  Apply 1  Application topically 2 (two) times daily. Agapito Games, MD  Active   ondansetron (ZOFRAN-ODT) 4 MG disintegrating tablet [id number]  Take 4 mg by mouth 2 (two) times daily as needed. [provider]  Active   oxyCODONE-acetaminophen (PERCOCET) 10-325 MG tablet [id number]  Take 1 tablet by mouth in the morning and at bedtime. [provider]  Active   rizatriptan (MAXALT-MLT) 10 MG disintegrating tablet [id number]  Take 1 tablet (10 mg total) by mouth as needed for migraine. May repeat in 2 hours if needed Agapito Games, MD  Active             Home Care and Equipment/Supplies: Were Home Health Services Ordered?: NA Any new equipment or medical supplies ordered?: NA  Functional Questionnaire: Do you need assistance with bathing/showering or dressing?: No Do you need assistance with meal preparation?: No Do you need assistance with eating?: No Do you have difficulty maintaining continence: No Do you need assistance with getting out of bed/getting out of a chair/moving?: No Do you have difficulty managing or taking your medications?: No  Follow up appointments reviewed: PCP Follow-up appointment confirmed?: Yes Date of PCP follow-up appointment?: 06/20/23 Follow-up Provider: PCP Specialist Hospital Follow-up appointment confirmed?: NA Do you need transportation to your follow-up appointment?: No Do you understand care options if your condition(s) worsen?: Yes-patient verbalized understanding  SDOH Interventions Today    Flowsheet Row Most Recent Value  SDOH Interventions   Transportation Interventions Intervention Not Indicated       SIGNATURE Modesto Charon, RN BSN Nurse Health Advisor

## 2023-06-17 ENCOUNTER — Emergency Department (HOSPITAL_COMMUNITY)
Admission: EM | Admit: 2023-06-17 | Discharge: 2023-06-18 | Disposition: A | Discharge status: Home / Self Care | Payer: Medicaid Other | Attending: Emergency Medicine | Admitting: Emergency Medicine

## 2023-06-17 ENCOUNTER — Encounter: Payer: Self-pay | Admitting: Family Medicine

## 2023-06-17 ENCOUNTER — Emergency Department (HOSPITAL_COMMUNITY): Payer: Medicaid Other

## 2023-06-17 ENCOUNTER — Encounter (HOSPITAL_COMMUNITY): Payer: Self-pay

## 2023-06-17 DIAGNOSIS — R1032 Left lower quadrant pain: Secondary | ICD-10-CM | POA: Insufficient documentation

## 2023-06-17 DIAGNOSIS — R109 Unspecified abdominal pain: Secondary | ICD-10-CM

## 2023-06-17 DIAGNOSIS — E785 Hyperlipidemia, unspecified: Secondary | ICD-10-CM

## 2023-06-17 DIAGNOSIS — R0602 Shortness of breath: Secondary | ICD-10-CM | POA: Insufficient documentation

## 2023-06-17 DIAGNOSIS — E559 Vitamin D deficiency, unspecified: Secondary | ICD-10-CM | POA: Diagnosis not present

## 2023-06-17 DIAGNOSIS — R42 Dizziness and giddiness: Secondary | ICD-10-CM | POA: Diagnosis not present

## 2023-06-17 DIAGNOSIS — Z6841 Body Mass Index (BMI) 40.0 and over, adult: Secondary | ICD-10-CM | POA: Diagnosis not present

## 2023-06-17 DIAGNOSIS — Z79899 Other long term (current) drug therapy: Secondary | ICD-10-CM | POA: Diagnosis not present

## 2023-06-17 DIAGNOSIS — E611 Iron deficiency: Secondary | ICD-10-CM | POA: Diagnosis not present

## 2023-06-17 DIAGNOSIS — Z7901 Long term (current) use of anticoagulants: Secondary | ICD-10-CM | POA: Insufficient documentation

## 2023-06-17 DIAGNOSIS — R079 Chest pain, unspecified: Secondary | ICD-10-CM | POA: Diagnosis not present

## 2023-06-17 DIAGNOSIS — E7800 Pure hypercholesterolemia, unspecified: Secondary | ICD-10-CM | POA: Diagnosis not present

## 2023-06-17 DIAGNOSIS — R0789 Other chest pain: Secondary | ICD-10-CM | POA: Diagnosis not present

## 2023-06-17 DIAGNOSIS — R918 Other nonspecific abnormal finding of lung field: Secondary | ICD-10-CM | POA: Diagnosis not present

## 2023-06-17 DIAGNOSIS — N915 Oligomenorrhea, unspecified: Secondary | ICD-10-CM | POA: Diagnosis not present

## 2023-06-17 DIAGNOSIS — R3129 Other microscopic hematuria: Secondary | ICD-10-CM | POA: Diagnosis not present

## 2023-06-17 DIAGNOSIS — D539 Nutritional anemia, unspecified: Secondary | ICD-10-CM | POA: Diagnosis not present

## 2023-06-17 DIAGNOSIS — F17210 Nicotine dependence, cigarettes, uncomplicated: Secondary | ICD-10-CM | POA: Diagnosis not present

## 2023-06-17 DIAGNOSIS — N9489 Other specified conditions associated with female genital organs and menstrual cycle: Secondary | ICD-10-CM | POA: Diagnosis not present

## 2023-06-17 LAB — CBC
HCT: 41.8 % (ref 36.0–46.0)
Hemoglobin: 13.7 g/dL (ref 12.0–15.0)
MCH: 28.9 pg (ref 26.0–34.0)
MCHC: 32.8 g/dL (ref 30.0–36.0)
MCV: 88.2 fL (ref 80.0–100.0)
Platelets: 485 10*3/uL — ABNORMAL HIGH (ref 150–400)
RBC: 4.74 MIL/uL (ref 3.87–5.11)
RDW: 12.3 % (ref 11.5–15.5)
WBC: 8.6 10*3/uL (ref 4.0–10.5)
nRBC: 0 % (ref 0.0–0.2)

## 2023-06-17 NOTE — ED Triage Notes (Signed)
Pt is coming in for not feeling better since this past Saturday when she came in for a syncopal episode that she brought here for by medic. Still expressing some concerns for chest pain, dizziness, and overall generally not feeling well. Pt also having some hematuria since being Dx with Kidney infection on July 3rd, and still having some urinary symptoms.

## 2023-06-18 LAB — BASIC METABOLIC PANEL
Anion gap: 9 (ref 5–15)
BUN: 7 mg/dL (ref 6–20)
CO2: 25 mmol/L (ref 22–32)
Calcium: 9 mg/dL (ref 8.9–10.3)
Chloride: 101 mmol/L (ref 98–111)
Creatinine, Ser: 0.73 mg/dL (ref 0.44–1.00)
GFR, Estimated: >60 mL/min (ref >60)
Glucose, Bld: 91 mg/dL (ref 70–99)
Potassium: 3.8 mmol/L (ref 3.5–5.1)
Sodium: 135 mmol/L (ref 135–145)

## 2023-06-18 LAB — URINALYSIS, ROUTINE W REFLEX MICROSCOPIC
Bilirubin Urine: NEGATIVE
Glucose, UA: NEGATIVE mg/dL
Hgb urine dipstick: NEGATIVE
Ketones, ur: NEGATIVE mg/dL
Nitrite: NEGATIVE
Protein, ur: NEGATIVE mg/dL
Specific Gravity, Urine: 1.012 (ref 1.005–1.030)
pH: 5 (ref 5.0–8.0)

## 2023-06-18 LAB — TROPONIN I (HIGH SENSITIVITY)
Troponin I (High Sensitivity): <2 ng/L (ref <18)
Troponin I (High Sensitivity): <2 ng/L (ref <18)

## 2023-06-18 NOTE — ED Provider Notes (Signed)
[place] EMERGENCY DEPARTMENT AT [facility] Provider Note   CSN: [id number] Arrival date & time: 06/17/23  2255     History  Chief Complaint  Patient presents with   Chest Pain    [name] is a 32 y.o. female.  32 year old female with dizziness, SHOB, ongoing left flank pain. 06/12/23 seen and diagnosed with kidney infection, started on Keflex on 06/13/23, continues to have left flank pain, worse with lying on left side. Syncopal episode on Saturday, seen at the ER again that day and dc.  Nausea, dry heaves. No changes in bowel habits. Urinary frequency without dysuria. Seen at pain management today (chronic pelvic pain) and told she had blood in her urine.        Home Medications Prior to Admission medications   Medication Sig Start Date End Date Taking? Authorizing Provider  alum & mag hydroxide-simeth (MAALOX/MYLANTA) 200-200-20 MG/5ML suspension Take by mouth every 6 (six) hours as needed for indigestion or heartburn.    [provider]  apixaban (ELIQUIS) 5 MG TABS tablet Take 1 tablet (5 mg total) by mouth 2 (two) times daily. 03/26/17   Richarda Overlie, MD  cephALEXin (KEFLEX) 500 MG capsule Take 500 mg by mouth 3 (three) times daily.    [provider]  cholecalciferol (VITAMIN D3) 25 MCG (1000 UNIT) tablet Take 5,000 Units by mouth daily.    [provider]  ferrous sulfate 325 (65 FE) MG EC tablet Take 325 mg by mouth daily with breakfast.    [provider]  nystatin (MYCOSTATIN/NYSTOP) powder Apply 1 Application topically 2 (two) times daily. 04/02/23   Agapito Games, MD  ondansetron (ZOFRAN-ODT) 4 MG disintegrating tablet Take 4 mg by mouth 2 (two) times daily as needed. 03/05/22   [provider]  oxyCODONE-acetaminophen (PERCOCET) 10-325 MG tablet Take 1 tablet by mouth in the morning and at bedtime.    [provider]  rizatriptan (MAXALT-MLT) 10 MG disintegrating tablet Take 1 tablet (10  mg total) by mouth as needed for migraine. May repeat in 2 hours if needed 04/04/22   Agapito Games, MD      Allergies    Fluoxetine, Methocarbamol, Tramadol, Ceftriaxone, Gabapentin, Hydrocodone-acetaminophen, Levofloxacin, Morphine, Nsaids, Topiramate, Vicodin [hydrocodone-acetaminophen], Ketorolac tromethamine, and Metoclopramide    Review of Systems   Review of Systems Negative except as per HPI Physical Exam Updated Vital Signs BP 112/64 (BP Location: Right Arm)   Pulse 72   Temp 98.4 F (36.9 C) (Oral)   Resp 15   LMP 06/04/2023   SpO2 100%  Physical Exam Vitals and nursing note reviewed.  Constitutional:      General: She is not in acute distress.    Appearance: She is well-developed. She is not diaphoretic.  HENT:     Head: Normocephalic and atraumatic.  Cardiovascular:     Rate and Rhythm: Normal rate and regular rhythm.     Heart sounds: Normal heart sounds.  Pulmonary:     Effort: Pulmonary effort is normal.     Breath sounds: Normal breath sounds.  Chest:     Chest wall: No tenderness.  Abdominal:     Palpations: Abdomen is soft.     Tenderness: There is abdominal tenderness in the left lower quadrant.     Comments: Mild LLQ tenderness   Musculoskeletal:     Cervical back: Neck supple.     Thoracic back: No tenderness or bony tenderness.     Lumbar back:  Tenderness present. No bony tenderness.       Back:     Right lower leg: No tenderness. No edema.     Left lower leg: No tenderness. No edema.  Skin:    General: Skin is warm and dry.     Findings: No ecchymosis, erythema or rash.  Neurological:     Mental Status: She is alert and oriented to person, place, and time.  Psychiatric:        Behavior: Behavior normal.     ED Results / Procedures / Treatments   Labs (all labs ordered are listed, but only abnormal results are displayed) Labs Reviewed  CBC - Abnormal; Notable for the following components:      Result Value   Platelets 485 (*)     All other components within normal limits  URINALYSIS, ROUTINE W REFLEX MICROSCOPIC - Abnormal; Notable for the following components:   APPearance HAZY (*)    Leukocytes,Ua TRACE (*)    Bacteria, UA RARE (*)    All other components within normal limits  BASIC METABOLIC PANEL  TROPONIN I (HIGH SENSITIVITY)  TROPONIN I (HIGH SENSITIVITY)    EKG EKG Interpretation Date/Time:  Tuesday June 17 2023 23:09:47 EDT Ventricular Rate:  78 PR Interval:  162 QRS Duration:  76 QT Interval:  388 QTC Calculation: 442 R Axis:   38  Text Interpretation: Normal sinus rhythm Normal ECG When compared with ECG of 10-Jun-2023 20:36, PREVIOUS ECG IS PRESENT No previous ECGs available Confirmed by Glynn Octave [phone]) on 06/18/2023 2:49:06 AM  Radiology DG Chest 2 View  Result Date: 06/17/2023 CLINICAL DATA:  Chest pain. EXAM: CHEST - 2 VIEW COMPARISON:  06/10/2023, 1 week ago FINDINGS: Prior median sternotomythe cardiomediastinal contours are normal. Bronchial thickening. Pulmonary vasculature is normal. No consolidation, pleural effusion, or pneumothorax. No acute osseous abnormalities are seen. IMPRESSION: Bronchial thickening, can be seen with bronchitis or asthma. Electronically Signed   By: Narda Rutherford M.D.   On: 06/17/2023 23:30    Procedures Procedures    Medications Ordered in ED Medications - No data to display  ED Course/ Medical Decision Making/ A&P                             Medical Decision Making Amount and/or Complexity of Data Reviewed Labs: ordered. Radiology: ordered.   This patient presents to the ED for concern of SHOB, left flank pain with report of microscopic hematuria at pain management, dizziness, this involves an extensive number of treatment options, and is a complaint that carries with it a high risk of complications and morbidity.  The differential diagnosis includes but not limited to UTI, kidney stone, ovarian cyst, PE, arrhythmia    Co morbidities  that complicate the patient evaluation  TIA, IDA, migraines, DVT, right atrial thrombus, asthma   Additional history obtained:  External records from outside source obtained and reviewed including recent visits to the ER including July 2 24th where patient was presented for chest pain but left without being seen.  ER visit dated 06/12/2023 to [facility], CT negative for stone, diagnosed with UTI and started on Keflex, urine culture was ultimately negative.  ER visit at [facility] dated 06/14/2023 after syncopal episode thought to be related to being out in the heat all day.  Patient states that she had taken her daughter inside to clean up a scrape on her leg, felt like she was going to pass out and was  assisted to a chair, did not hit head/fall.   Lab Tests:  I Ordered, and personally interpreted labs.  The pertinent results include: Troponins are unremarkable.  Urinalysis is contaminated.  BMP within normal notes.  CBC without significant findings.   Imaging Studies ordered:  I ordered imaging studies including chest x-ray I independently visualized and interpreted imaging which showed asthma, bronchitic changes I agree with the radiologist interpretation   Cardiac Monitoring: / EKG:  The patient was maintained on a cardiac monitor.  I personally viewed and interpreted the cardiac monitored which showed an underlying rhythm of: Normal sinus rhythm, rate 78   Problem List / ED Course / Critical interventions / Medication management  32 year old female returns to the ER, continues to feel unwell.  She states that she is having ongoing left flank pain, diagnosed with a urinary tract infection recently and is concerned the antibiotics are not working.  She reports urinary frequency without dysuria.  Patient went her main management doctor today, had a urinalysis and was told there was blood in her urine.  Regarding this concern, I reviewed her recent CT stone study obtained in the ER in the past week which  was negative for any findings including any ovarian or renal pathology.  Her urinalysis today is contaminated, her culture last week was negative.  Her white count is normal. Patient reports ongoing shortness of breath, she is on Eliquis, recent D-dimer in the ER in the past week was negative, chest x-ray today suggesting asthma. Regarding her dizziness, current EKG shows normal sinus rhythm.  She is not orthostatic tonight.  Patient is encouraged to follow-up with her cardiologist and primary care providers for ongoing care. I have reviewed the patients home medicines and have made adjustments as needed   Social Determinants of Health:  Has PCP and cardiology, recommend follow-up   Test / Admission - Considered:  Stable for discharge with plan to recheck with PCP and cardiologist.         Final Clinical Impression(s) / ED Diagnoses Final diagnoses:  Flank pain  Shortness of breath  Dizziness    Rx / DC Orders ED Discharge Orders     None         Jeannie Fend, PA-C 06/18/23 0414    Glynn Octave, MD 06/18/23 [phone]

## 2023-06-18 NOTE — Discharge Instructions (Addendum)
Follow-up with your primary care provider and cardiologist.  Return to the ER for worsening or concerning symptoms. Labs reviewed today and are reassuring.  Your urine culture from your July 4 visit was negative for infection.  Your CT scan from recent visit also does not show any kidney stones or other acute findings to suggest cause for your pain.  Your recent blood test is negative for blood clot/PE.  Recommend further workup with your care team as ER evaluations have been unrevealing and reassuring.

## 2023-06-19 ENCOUNTER — Ambulatory Visit (INDEPENDENT_AMBULATORY_CARE_PROVIDER_SITE_OTHER): Payer: Medicaid Other | Admitting: Family Medicine

## 2023-06-19 ENCOUNTER — Encounter: Payer: Self-pay | Admitting: Family Medicine

## 2023-06-19 VITALS — BP 127/64 | HR 72 | Temp 98.3°F | Ht 63.0 in | Wt 238.0 lb

## 2023-06-19 DIAGNOSIS — R109 Unspecified abdominal pain: Secondary | ICD-10-CM

## 2023-06-19 DIAGNOSIS — R55 Syncope and collapse: Secondary | ICD-10-CM

## 2023-06-19 DIAGNOSIS — R051 Acute cough: Secondary | ICD-10-CM

## 2023-06-19 DIAGNOSIS — R0789 Other chest pain: Secondary | ICD-10-CM

## 2023-06-19 DIAGNOSIS — R42 Dizziness and giddiness: Secondary | ICD-10-CM

## 2023-06-19 LAB — POC COVID19 BINAXNOW: SARS Coronavirus 2 Ag: NEGATIVE

## 2023-06-19 NOTE — Progress Notes (Signed)
Established Patient Office Visit  Subjective   Patient ID: [name], female    DOB: 1991-08-22  Age: 32 y.o. MRN: [id number]  Chief Complaint  Patient presents with   Dizziness   Hypotension    HPI Here for hospital/ED follow-up.  She was seen on July 6 for syncope and collapse and then seen again 3 days later on July 9 for abdominal pain, shortness of breath and dizziness. Chest is hurting and tight. Hard to breath.  She has a prior history of DVT and had a negative D-dimer at the emergency room.  Hemoglobin was normal but she has had an iron panel drawn at her pain management but does not have results back yet.  No cough or shortness of breath.  Little bit of mild nasal congestion today.  No wheezing.  Also having lower abdominal pain. Worse with BM and eatings.   Has been doing PT for her back in April, May and June.   She feels her iron is low.    She is also still having some left flank pain that has not really eased off.  Pressures have been going low and then normalizing.  Her pain management told her to hold her pain meds if her blood pressure is low so she has been taking her pain meds more regularly.  She says when she starts to get a little dizzy or lightheaded her ears will start ringing and then she feels like her vision is going to go black.     ROS    Objective:     BP 127/64   Pulse 72   Temp 98.3 F (36.8 C)   Ht 5\' 3"  (1.6 m)   Wt 238 lb (108 kg)   LMP 06/04/2023   SpO2 99%   BMI 42.16 kg/m    Physical Exam Vitals and nursing note reviewed.  Constitutional:      Appearance: She is well-developed.  HENT:     Head: Normocephalic and atraumatic.     Right Ear: Tympanic membrane, ear canal and external ear normal.     Left Ear: External ear normal.     Nose: Nose normal.     Mouth/Throat:     Pharynx: Oropharynx is clear.  Eyes:     Conjunctiva/sclera: Conjunctivae normal.     Pupils: Pupils are equal, round, and reactive to  light.  Neck:     Thyroid: No thyromegaly.  Cardiovascular:     Rate and Rhythm: Normal rate and regular rhythm.     Heart sounds: Normal heart sounds.  Pulmonary:     Effort: Pulmonary effort is normal.     Breath sounds: Normal breath sounds. No wheezing.  Musculoskeletal:     Cervical back: Neck supple.  Lymphadenopathy:     Cervical: No cervical adenopathy.  Skin:    General: Skin is warm and dry.     Coloration: Skin is not pale.  Neurological:     Mental Status: She is alert and oriented to person, place, and time.  Psychiatric:        Behavior: Behavior normal.      Results for orders placed or performed in visit on 06/19/23  POC COVID-19  Result Value Ref Range   SARS Coronavirus 2 Ag Negative Negative      The ASCVD Risk score (Arnett DK, et al., 2019) failed to calculate for the following reasons:   The 2019 ASCVD risk score is only valid for ages 56 to 23  The patient has a prior MI or stroke diagnosis    Assessment & Plan:   Problem List Items Addressed This Visit   None Visit Diagnoses     Dizziness    -  Primary   Relevant Orders   POC COVID-19 (Completed)   Ambulatory referral to Cardiology   Near syncope       Relevant Orders   Ambulatory referral to Cardiology   Acute cough       Chest tightness       Left flank pain            Dizziness/near syncope-orthostatics were normal today.  Will refer back to cardiology for further workup and evaluation.  She might benefit from blood pressure monitoring and/or heart monitor.  She is not feeling any palpitations or tachycardia.  She only notices that if she feels like she is getting ready to pass out.  Chest tightness-negative for COVID today.  She says she is taking her Eliquis regularly and recent D-dimer was negative so pulmonary embolism is very unlikely.  Consider other causes such as pericarditis.  She just had an echocardiogram about 6 to 7 weeks ago and everything looked great.  Iron-she  has not been able to take her iron lately because it has been making her stomach hurt she even tried an over-the-counter plant-based iron and was not helpful.  Still awaiting iron panel from pain management I encouraged her to let me know what her numbers show when she gets that information back she says she will hopefully hear back tomorrow.  Could consider referral to hematology for iron infusion if it is low.  Return if symptoms worsen or fail to improve.    Nani Gasser, MD

## 2023-06-20 ENCOUNTER — Ambulatory Visit: Payer: Medicaid Other | Admitting: Family Medicine

## 2023-06-25 ENCOUNTER — Telehealth: Payer: Self-pay

## 2023-06-25 ENCOUNTER — Telehealth: Payer: Self-pay | Admitting: Cardiology

## 2023-06-25 DIAGNOSIS — R790 Abnormal level of blood mineral: Secondary | ICD-10-CM

## 2023-06-25 NOTE — Addendum Note (Signed)
Addended by: Elizabeth Palau on: 06/25/2023 01:28 PM   Modules accepted: Orders

## 2023-06-25 NOTE — Telephone Encounter (Signed)
Line disconnected when waiting for triage. Let triage team know.

## 2023-06-25 NOTE — Telephone Encounter (Signed)
A for UA, urine culture, TSH and free T4

## 2023-06-25 NOTE — Telephone Encounter (Signed)
Attempted call to patient to find out when she plans to return for blood work  as will need to know this to be sure that is entered into the chart correctly for either [facility].

## 2023-06-25 NOTE — Telephone Encounter (Signed)
Called patient and she reported that on 07/09/23 she had passed out and was unresponsive for 1-2 minutes and was taken to the ER. Since then she has had about 8 episodes where her vision goes black, her hearing goes away, she gets headaches and has chest tightness and she becomes SOB. Her blood pressure fluctuates up and down. These episodes last around 2-3 hours and she has had 8 of them since July 09, 2023. I recommended that the patient go to the ER to be evaluated and she stated that she had been to the ER twice the past week and every test they perform is negative. Their only recommendation is to follow up with Hematology. Spoke to Dr. Bing Matter and he asked that she be added to his schedule tomorrow. Patient was scheduled an appointment for 06/26/23 at 3:00 pm. Patient asked what she should do if she has another episode and I recommended that she go to the ER. Patient was agreeable with this plan and had no further questions at this time.

## 2023-06-25 NOTE — Telephone Encounter (Signed)
Orders added to patient chart.

## 2023-06-25 NOTE — Telephone Encounter (Signed)
Glad she has cardiology follow-up.  Referral placed.

## 2023-06-25 NOTE — Telephone Encounter (Signed)
Pt c/o of Chest Pain: STAT if CP now or developed within 24 hours  1. Are you having CP right now? Yes   2. Are you experiencing any other symptoms (ex. SOB, nausea, vomiting, sweating)? Sweating, low bp, nausea and unsteady   3. How long have you been experiencing CP? Two weeks   4. Is your CP continuous or coming and going? Coming and going   5. Have you taken Nitroglycerin? No  ?

## 2023-06-25 NOTE — Telephone Encounter (Signed)
Patient informed. 

## 2023-06-25 NOTE — Telephone Encounter (Signed)
Patient called  States she was told that hematology referral has not been received  states she sees them once a year  regarding iron and can not schedule until the referral is received.  She sees [place] [facility] [place].  Pended this referral   She also wanted to let Dr. Linford Arnold know that  she has an upcoming appt with cardiology ( Dr. Gypsy Balsam)  tomorrow 06/26/23 at 3pm = she is continuing with random low blood pressure readings, syncope, loss of vision and hearing and sweating. She was told that if patient experienced these events between now and cardiology appt to go ahead to the ER for treatment.

## 2023-06-26 ENCOUNTER — Ambulatory Visit: Payer: Medicaid Other | Attending: Cardiology | Admitting: Cardiology

## 2023-06-26 ENCOUNTER — Encounter: Payer: Self-pay | Admitting: Cardiology

## 2023-06-26 ENCOUNTER — Ambulatory Visit: Payer: Medicaid Other | Attending: Cardiology

## 2023-06-26 VITALS — BP 112/72 | HR 82 | Ht 63.0 in | Wt 241.2 lb

## 2023-06-26 DIAGNOSIS — E785 Hyperlipidemia, unspecified: Secondary | ICD-10-CM | POA: Diagnosis not present

## 2023-06-26 DIAGNOSIS — D689 Coagulation defect, unspecified: Secondary | ICD-10-CM

## 2023-06-26 DIAGNOSIS — R55 Syncope and collapse: Secondary | ICD-10-CM

## 2023-06-26 DIAGNOSIS — R109 Unspecified abdominal pain: Secondary | ICD-10-CM | POA: Diagnosis not present

## 2023-06-26 DIAGNOSIS — R0789 Other chest pain: Secondary | ICD-10-CM | POA: Diagnosis not present

## 2023-06-26 DIAGNOSIS — R002 Palpitations: Secondary | ICD-10-CM | POA: Diagnosis not present

## 2023-06-26 NOTE — Progress Notes (Signed)
Cardiology Office Note:    Date:  06/26/2023   ID:  [name], DOB 1991/07/21, MRN [id number]  PCP:  Agapito Games, MD  Cardiologist:  Gypsy Balsam, MD    Referring MD: Agapito Games, *   Chief Complaint  Patient presents with   Chest Pain   Loss of Consciousness   Dizziness   Fatigue   Decreased Visual Acuity   Night Sweats    History of Present Illness:    [name] is a 32 y.o. female with past medical history significant for coagulation issues 2014 she end up having pulmonary emboli, she required surgical removal of her blood clot from her right atrium that was in 2014 at the time she was put anticoagulation however in 2016 she ended up having some financial problems and stopped taking anticoagulation and of having DVT and PE at that time transesophageal echocardiogram was done which showed presence of right atrial thrombi.  She was put back on anticoagulation at the same time she was find to have small PFO.  In 2019 she ended up having most likely TIA that was blamed on the fact that she was not compliant with medication.  Since that time she seems to be taking medication religiously and seems to be doing quite well.  She came to me today because she had episode of syncope.  Apparently was very hot day and she did not drink much and she came back from a pool at home and started feeling swimmy headed tunnel vision and ringing in the ear and she ended up sitting in the chair and passing out.  She was taken to the hospital.  All test were negative D-dimer was negative she was told to be dehydrated probably having heatstroke and she was discharged home.  Since that time she describes few episodes of similar but not as profound as the day that she described above.  Past Medical History:  Diagnosis Date   Asthma    Clotting disorder (HCC)    per 01/09/22 OV note from Dr. Bing Matter, pt has some coagulopathy. However extensive hematological work-up  has been unrevealing.   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   TIA (transient ischemic attack) 2018   right-sided weakness   Tobacco abuse    UTI (urinary tract infection)     Past Surgical History:  Procedure Laterality Date   btl     CARDIAC SURGERY  2014   thrombectomy to remove thrombi in right atrium   CHOLECYSTECTOMY     TEE WITHOUT CARDIOVERSION N/A 07/09/2016   Procedure: TRANSESOPHAGEAL ECHOCARDIOGRAM (TEE);  Surgeon: Orpah Cobb, MD;  Location: [facility] ENDOSCOPY;  Service: Cardiovascular;  Laterality: N/A;   TEE WITHOUT CARDIOVERSION N/A 03/19/2017   Procedure: TRANSESOPHAGEAL ECHOCARDIOGRAM (TEE);  Surgeon: Orpah Cobb, MD;  Location[facility] ENDOSCOPY;  Service: Cardiovascular;  Laterality: N/A;    Current Medications: Current Meds  Medication Sig   apixaban (ELIQUIS) 5 MG TABS tablet Take 1 tablet (5 mg total) by mouth 2 (two) times daily.   ferrous sulfate 325 (65 FE) MG EC tablet  Take 325 mg by mouth daily with breakfast.   ondansetron (ZOFRAN-ODT) 4 MG disintegrating tablet Take 4 mg by mouth 2 (two) times daily as needed for nausea or vomiting.   oxyCODONE-acetaminophen (PERCOCET) 10-325 MG tablet Take 1 tablet by mouth every 6 (six) hours as needed for pain.   rizatriptan (MAXALT-MLT) 10 MG disintegrating tablet Take 1 tablet (10 mg total) by mouth as needed for migraine. May repeat in 2 hours if needed     Allergies:   Ceftriaxone, Gabapentin, Levofloxacin, Morphine, Nsaids, and Vicodin [hydrocodone-acetaminophen]   Social History   Socioeconomic History   Marital status: Single    Spouse name: Not on file   Number of children: 2    Years of education: Not on file   Highest education level: Not on file  Occupational History   Occupation: unemployed  Tobacco Use   Smoking status: Some Days    Types: Cigarettes   Smokeless tobacco: Current   Tobacco comments:    Vape   Vaping Use   Vaping status: Every Day  Substance and Sexual Activity   Alcohol use: No   Drug use: No   Sexual activity: Yes    Birth control/protection: Surgical  Other Topics Concern   Not on file  Social History Narrative   No regular exercise. No caffeine.    Social Determinants of Health   Financial Resource Strain: Not on file  Food Insecurity: Low Risk  (05/28/2023)   [facility]   Food vital sign    Within the past 12 months, you worried that your food would run out before you got money to buy more: Never true    Within the past 12 months, the food you bought just didn't last and you didn't have money to get more. : Never true  Transportation Needs: No Transportation Needs (06/16/2023)   PRAPARE - Administrator, Civil Service (Medical): No    Lack of Transportation (Non-Medical): No  Physical Activity: Not on file  Stress: Not on file  Social Connections: Unknown (04/10/2022)   Received from [facility], [facility]   Social Network    Social Network: Not on file     Family History: The patient's family history includes Arthritis in her father; Bipolar disorder in her mother; Glaucoma in her mother; Healthy in her brother, sister, and sister; Lupus in her sister. ROS:   Please see the history of present illness.    All 14 point review of systems negative except as described per history of present illness  EKGs/Labs/Other Studies Reviewed:         Recent Labs: 07/01/2022: ALT 7; TSH 1.31 06/17/2023: BUN 7; Creatinine, Ser 0.73; Hemoglobin 13.7; Platelets 485; Potassium 3.8; Sodium 135  Recent Lipid Panel    Component Value Date/Time   CHOL 176 03/18/2017 0406   TRIG 239 (H)  03/18/2017 0406   HDL 36 (L) 03/18/2017 0406   CHOLHDL 4.9 03/18/2017 0406   VLDL 48 (H) 03/18/2017 0406   LDLCALC 92 03/18/2017 0406    Physical Exam:    VS:  BP 112/72 (BP Location: Left Arm, Patient Position: Sitting)   Pulse 82   Ht 5\' 3"  (1.6 m)   Wt 241 lb 3.2 oz (109.4 kg)   LMP 06/04/2023   SpO2 99%   BMI 42.73 kg/m     Wt Readings from Last 3 Encounters:  06/26/23 241 lb 3.2 oz (109.4 kg)  06/19/23 238 lb (108 kg)  06/10/23 230 lb (104.3 kg)     GEN:  Well nourished, well developed in no acute distress HEENT: Normal NECK: No JVD; No carotid bruits LYMPHATICS: No lymphadenopathy CARDIAC: RRR, no murmurs, no rubs, no gallops RESPIRATORY:  Clear to auscultation without rales, wheezing or rhonchi  ABDOMEN: Soft, non-tender, non-distended MUSCULOSKELETAL:  No edema; No deformity  SKIN: Warm and dry LOWER EXTREMITIES: no swelling NEUROLOGIC:  Alert and oriented x 3 PSYCHIATRIC:  Normal affect   ASSESSMENT:    1. Atypical chest pain   2. Syncope and collapse   3. Palpitations   4. Clotting disorder (HCC)    PLAN:    In order of problems listed above:  Atypical chest pain.  I did review coronary scans that she had done in [place] in 2021 which was normal.  Will continue monitoring. Near syncopal episode probably vasovagal component of dehydration.  At this stage I told her to stay well-hydrated on top of that I will ask her to wear Zio patch make sure she does not have any arrhythmia associated with it even though she did not feel any palpitations while it happens except sometimes spitting up her heart rate.  I will schedule her also to have an echocardiogram to assess left ventricle ejection fraction. Palpitations Zio patch will be placed on her. Clotting disorder she continue taking anticoagulation which is very appropriate.   Medication Adjustments/Labs and Tests Ordered: Current medicines are reviewed at length with the patient today.  Concerns regarding  medicines are outlined above.  Orders Placed This Encounter  Procedures   LONG TERM MONITOR (3-14 DAYS)   EKG 12-Lead   ECHOCARDIOGRAM COMPLETE   Medication changes: No orders of the defined types were placed in this encounter.   Signed, Georgeanna Lea, MD, [facility] 06/26/2023 4:39 PM    [facility]

## 2023-06-26 NOTE — Patient Instructions (Signed)
Medication Instructions:  Your physician recommends that you continue on your current medications as directed. Please refer to the Current Medication list given to you today.  *If you need a refill on your cardiac medications before your next appointment, please call your pharmacy*   Lab Work: None Ordered If you have labs (blood work) drawn today and your tests are completely normal, you will receive your results only by: MyChart Message (if you have MyChart) OR A paper copy in the mail If you have any lab test that is abnormal or we need to change your treatment, we will call you to review the results.   Testing/Procedures: Your physician has requested that you have an echocardiogram. Echocardiography is a painless test that uses sound waves to create images of your heart. It provides your doctor with information about the size and shape of your heart and how well your heart's chambers and valves are working. This procedure takes approximately one hour. There are no restrictions for this procedure. Please do NOT wear cologne, perfume, aftershave, or lotions (deodorant is allowed). Please arrive 15 minutes prior to your appointment time.    WHY IS MY DOCTOR PRESCRIBING ZIO? The Zio system is proven and trusted by physicians to detect and diagnose irregular heart rhythms -- and has been prescribed to hundreds of thousands of patients.  The FDA has cleared the Zio system to monitor for many different kinds of irregular heart rhythms. In a study, physicians were able to reach a diagnosis 90% of the time with the Zio system1.  You can wear the Zio monitor -- a small, discreet, comfortable patch -- during your normal day-to-day activity, including while you sleep, shower, and exercise, while it records every single heartbeat for analysis.  1Barrett, P., et al. Comparison of 24 Hour Holter Monitoring Versus 14 Day Novel Adhesive Patch Electrocardiographic Monitoring. American Journal of  Medicine, 2014.  ZIO VS. HOLTER MONITORING The Zio monitor can be comfortably worn for up to 14 days. Holter monitors can be worn for 24 to 48 hours, limiting the time to record any irregular heart rhythms you may have. Zio is able to capture data for the 51% of patients who have their first symptom-triggered arrhythmia after 48 hours.1  LIVE WITHOUT RESTRICTIONS The Zio ambulatory cardiac monitor is a small, unobtrusive, and water-resistant patch--you might even forget you're wearing it. The Zio monitor records and stores every beat of your heart, whether you're sleeping, working out, or showering.     Follow-Up: At [facility], you and your health needs are our priority.  As part of our continuing mission to provide you with exceptional heart care, we have created designated Provider Care Teams.  These Care Teams include your primary Cardiologist (physician) and Advanced Practice Providers (APPs -  Physician Assistants and Nurse Practitioners) who all work together to provide you with the care you need, when you need it.  We recommend signing up for the patient portal called "MyChart".  Sign up information is provided on this After Visit Summary.  MyChart is used to connect with patients for Virtual Visits (Telemedicine).  Patients are able to view lab/test results, encounter notes, upcoming appointments, etc.  Non-urgent messages can be sent to your provider as well.   To learn more about what you can do with MyChart, go to [email or web address].com.    Your next appointment:   3 month(s)  The format for your next appointment:   In Person  Provider:   Robert Krasowski, MD      Other Instructions NA  

## 2023-06-27 ENCOUNTER — Encounter: Payer: Self-pay | Admitting: Family Medicine

## 2023-06-27 LAB — URINALYSIS
Bilirubin Urine: NEGATIVE
Glucose, UA: NEGATIVE
Hgb urine dipstick: NEGATIVE
Ketones, ur: NEGATIVE
Nitrite: NEGATIVE
Protein, ur: NEGATIVE
Specific Gravity, Urine: 1.025 (ref 1.001–1.035)
pH: 6 (ref 5.0–8.0)

## 2023-06-27 LAB — URINE CULTURE
MICRO NUMBER:: 15218895
SPECIMEN QUALITY:: ADEQUATE

## 2023-06-27 LAB — T4, FREE: Free T4: 1.2 ng/dL (ref 0.8–1.8)

## 2023-06-27 LAB — TSH: TSH: 0.8 mIU/L

## 2023-06-27 NOTE — Progress Notes (Signed)
Labs are urine look good so far .Awaiting the culture results

## 2023-06-30 ENCOUNTER — Encounter: Payer: Self-pay | Admitting: Family Medicine

## 2023-06-30 NOTE — Progress Notes (Signed)
HI [name],  Urine culture is negative no infection which is very reassuring.  So, please let us know you had your last Pap smear, so we can get your chart updated.  Do you see a GYN?

## 2023-07-02 ENCOUNTER — Telehealth: Payer: Self-pay | Admitting: Cardiology

## 2023-07-02 NOTE — Telephone Encounter (Signed)
Patient called stating she had to missed work today because she has been feeling dizzy and lightheaded.  She stated she needs a note for work.

## 2023-07-02 NOTE — Telephone Encounter (Signed)
Spoke with pt. She stated that she had an episode that she had to press her button for on the Zio monitor and was weak and lightheaded. She was afraid to drive due to being lightheaded and was not able to go to work. She is requesting a work note. Sent work note by My Chart.

## 2023-07-07 ENCOUNTER — Telehealth: Payer: Self-pay | Admitting: *Deleted

## 2023-07-07 NOTE — Telephone Encounter (Signed)
This email is to notify you that the patient listed below had a skin reaction to the Zio monitor and is sending the patch back early.

## 2023-07-08 ENCOUNTER — Telehealth: Payer: Self-pay | Admitting: Cardiology

## 2023-07-08 ENCOUNTER — Other Ambulatory Visit: Payer: Self-pay | Admitting: Family

## 2023-07-08 DIAGNOSIS — D509 Iron deficiency anemia, unspecified: Secondary | ICD-10-CM

## 2023-07-08 NOTE — Telephone Encounter (Signed)
Pt is requesting a callback from nurse regarding appt and zio monitor. Please advise

## 2023-07-08 NOTE — Telephone Encounter (Signed)
Called patient and she stated that her heart monitor fell off about 1 week ago. She called the number to zio and they told her to go ahead and send in the monitor. She asked if she needed to wear another heart monitor and Zio told her that it was up to her physician. She also has developed a rash with an open area on her left upper chest because she put a tegaderm over the heart monitor so it would not get wet in the shower. We are still waiting for the report from the first heart monitor from [facility]. Patient is asking if she should wear another monitor. Please advise.

## 2023-07-09 ENCOUNTER — Inpatient Hospital Stay: Payer: Medicaid Other | Admitting: Family

## 2023-07-09 ENCOUNTER — Ambulatory Visit (HOSPITAL_BASED_OUTPATIENT_CLINIC_OR_DEPARTMENT_OTHER): Payer: Medicaid Other

## 2023-07-09 ENCOUNTER — Inpatient Hospital Stay: Payer: Medicaid Other

## 2023-07-10 ENCOUNTER — Encounter: Payer: Self-pay | Admitting: Family Medicine

## 2023-07-10 NOTE — Telephone Encounter (Signed)
Left message for the patient to call back.

## 2023-07-10 NOTE — Telephone Encounter (Signed)
Pt returning nurses call from earlier. Please advise 

## 2023-07-11 ENCOUNTER — Ambulatory Visit (HOSPITAL_COMMUNITY): Payer: Medicaid Other

## 2023-07-11 NOTE — Telephone Encounter (Signed)
Called patient and she reported that she is still having the dizzy spells and does not feel safe driving to work while she is having dizzy spells. She requested a letter from Dr. Bing Matter allowing her to stay at home on 8/1 - 8/2 and to go back to work on Monday (8/5). Spoke with Dr. Bing Matter and he agreed to writing the letter to stay out of work. A letter was written that she could stay out of work on 8/1 - 8/2 and return to work on 8/5. Informed the patient that Dr. Bing Matter agreed to write the letter. Patient verbalized understanding and had no further questions at this time.

## 2023-07-15 ENCOUNTER — Ambulatory Visit: Payer: Medicaid Other

## 2023-07-16 ENCOUNTER — Ambulatory Visit: Payer: Medicaid Other

## 2023-07-16 DIAGNOSIS — E611 Iron deficiency: Secondary | ICD-10-CM | POA: Diagnosis not present

## 2023-07-16 DIAGNOSIS — N915 Oligomenorrhea, unspecified: Secondary | ICD-10-CM | POA: Diagnosis not present

## 2023-07-16 DIAGNOSIS — F17210 Nicotine dependence, cigarettes, uncomplicated: Secondary | ICD-10-CM | POA: Diagnosis not present

## 2023-07-16 DIAGNOSIS — E7800 Pure hypercholesterolemia, unspecified: Secondary | ICD-10-CM | POA: Diagnosis not present

## 2023-07-16 DIAGNOSIS — N9489 Other specified conditions associated with female genital organs and menstrual cycle: Secondary | ICD-10-CM | POA: Diagnosis not present

## 2023-07-16 DIAGNOSIS — Z6841 Body Mass Index (BMI) 40.0 and over, adult: Secondary | ICD-10-CM | POA: Diagnosis not present

## 2023-07-16 DIAGNOSIS — Z79899 Other long term (current) drug therapy: Secondary | ICD-10-CM | POA: Diagnosis not present

## 2023-07-16 DIAGNOSIS — E538 Deficiency of other specified B group vitamins: Secondary | ICD-10-CM | POA: Diagnosis not present

## 2023-07-17 ENCOUNTER — Ambulatory Visit (HOSPITAL_COMMUNITY): Payer: Medicaid Other

## 2023-07-18 ENCOUNTER — Inpatient Hospital Stay: Payer: Medicaid Other | Admitting: Family

## 2023-07-18 ENCOUNTER — Telehealth: Payer: Self-pay | Admitting: *Deleted

## 2023-07-18 ENCOUNTER — Ambulatory Visit (HOSPITAL_COMMUNITY)
Admission: RE | Admit: 2023-07-18 | Discharge: 2023-07-18 | Disposition: A | Discharge status: Home / Self Care | Payer: Medicaid Other | Source: Ambulatory Visit | Attending: Cardiology | Admitting: Cardiology

## 2023-07-18 ENCOUNTER — Inpatient Hospital Stay: Payer: Medicaid Other | Attending: Hematology & Oncology

## 2023-07-18 DIAGNOSIS — F17200 Nicotine dependence, unspecified, uncomplicated: Secondary | ICD-10-CM | POA: Insufficient documentation

## 2023-07-18 DIAGNOSIS — Z8673 Personal history of transient ischemic attack (TIA), and cerebral infarction without residual deficits: Secondary | ICD-10-CM | POA: Insufficient documentation

## 2023-07-18 DIAGNOSIS — R0600 Dyspnea, unspecified: Secondary | ICD-10-CM | POA: Insufficient documentation

## 2023-07-18 DIAGNOSIS — R0789 Other chest pain: Secondary | ICD-10-CM

## 2023-07-18 DIAGNOSIS — I358 Other nonrheumatic aortic valve disorders: Secondary | ICD-10-CM | POA: Diagnosis not present

## 2023-07-18 NOTE — Telephone Encounter (Signed)
Per Maralyn Sago, NP, do not reschedule this patient. She has had several NO Shows for a new patient appointment. Thanks

## 2023-07-21 ENCOUNTER — Telehealth: Payer: Self-pay | Admitting: Cardiology

## 2023-07-21 NOTE — Telephone Encounter (Signed)
  Patient is calling inquiring about her echo and monitor results

## 2023-07-21 NOTE — Telephone Encounter (Signed)
Advised that the result note has not been made by Dr. Bing Matter at this time. Will call once completed.

## 2023-07-22 ENCOUNTER — Telehealth: Payer: Medicaid Other | Admitting: Physician Assistant

## 2023-07-22 ENCOUNTER — Encounter: Payer: Self-pay | Admitting: Physician Assistant

## 2023-07-22 ENCOUNTER — Ambulatory Visit: Payer: Medicaid Other

## 2023-07-22 DIAGNOSIS — B349 Viral infection, unspecified: Secondary | ICD-10-CM

## 2023-07-22 MED ORDER — ONDANSETRON 4 MG PO TBDP
4.0000 mg | ORAL_TABLET | Freq: Three times a day (TID) | ORAL | 0 refills | Status: AC | PRN
Start: 2023-07-22 — End: ?

## 2023-07-22 MED ORDER — COVID-19 AT HOME ANTIGEN TEST VI KIT: PACK | 0 refills | Status: DC

## 2023-07-22 NOTE — Progress Notes (Signed)
Virtual Visit Consent   [name], you are scheduled for a virtual visit with a [facility] provider today. Just as with appointments in the office, your consent must be obtained to participate. Your consent will be active for this visit and any virtual visit you may have with one of our providers in the next 365 days. If you have a MyChart account, a copy of this consent can be sent to you electronically.  As this is a virtual visit, video technology does not allow for your provider to perform a traditional examination. This may limit your provider's ability to fully assess your condition. If your provider identifies any concerns that need to be evaluated in person or the need to arrange testing (such as labs, EKG, etc.), we will make arrangements to do so. Although advances in technology are sophisticated, we cannot ensure that it will always work on either your end or our end. If the connection with a video visit is poor, the visit may have to be switched to a telephone visit. With either a video or telephone visit, we are not always able to ensure that we have a secure connection.  By engaging in this virtual visit, you consent to the provision of healthcare and authorize for your insurance to be billed (if applicable) for the services provided during this visit. Depending on your insurance coverage, you may receive a charge related to this service.  I need to obtain your verbal consent now. Are you willing to proceed with your visit today? [name] has provided verbal consent on 07/22/2023 for a virtual visit (video or telephone). [name], [place]  Date: 07/22/2023 6:16 PM  Virtual Visit via Video Note   I, [name], connected with  Alica Loseke  ([id number], 21-Mar-1991) on 07/22/23 at  6:00 PM EDT by a video-enabled telemedicine application and verified that I am speaking with the correct person using two identifiers.  Location: Patient:  Virtual Visit Location Patient: Home Provider: Virtual Visit Location Provider: Home Office   I discussed the limitations of evaluation and management by telemedicine and the availability of in person appointments. The patient expressed understanding and agreed to proceed.    History of Present Illness: [name] is a 32 y.o. who identifies as a female who was assigned female at birth, and is being seen today for symptoms starting Saturday (4 days ago) with body aches, headache, nausea and loose stool. Notes some nasal congestion but without chest congestion or cough, fever. Denies melena, hematochezia or tenesmus. Denies SOB. Notes COVID is going around at work but she has not tested for this yet.   HPI: HPI  Problems:  Patient Active Problem List   Diagnosis Date Noted   Syncope and collapse 06/26/2023   Screening-pulmonary TB 05/15/2023   Epigastric abdominal pain 06/28/2022   Current every day vaping 04/12/2022   Encounter for chronic pain management 04/04/2022   Constipation 01/08/2022   Gastroesophageal reflux disease without esophagitis 12/26/2021   History of iron deficiency 12/26/2021   COVID 07/2020   Pelvic pain in female 08/11/2018   Abnormal vaginal bleeding 08/05/2018   Nicotine addiction 03/13/2018   Abnormal uterine and vaginal bleeding, unspecified 04/17/2017   Adnexal fullness 04/17/2017   Lower abdominal pain 04/17/2017   Dysphagia    Paresthesia    Noncompliance    Clotting disorder (HCC)    TIA (transient ischemic attack) 03/17/2017   Right sided weakness 03/17/2017   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   Right leg weakness 10/14/2016   Chest pain 07/07/2016   Numbness on right side 07/07/2016   Depression    PTSD (post-traumatic stress disorder)    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    Tobacco abuse    Thrombocytosis 12/06/2014   PCOS (polycystic ovarian syndrome) 07/20/2014    Migraines 07/20/2014   Anticoagulation management encounter 02/10/2014   DVT (deep venous thrombosis) (HCC) 11/29/2013   Palpitations 05/04/2013   History of PCOS 03/23/2013   History of DVT of lower extremity, 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    Allergies:  Allergies  Allergen Reactions   Ceftriaxone Itching and Other (See Comments)    Red man syndrome   Gabapentin Other (See Comments)    Confusion/ chest pains   Levofloxacin Other (See Comments)    Severe abdominal pain  Other Reaction(s): GI Intolerance   Morphine Itching and Nausea Only    Other Reaction(s): dizziness, GI Intolerance   Nsaids Other (See Comments)    Pt told by hematologist not to take   Vicodin [Hydrocodone-Acetaminophen] Other (See Comments)    hallucinations   Medications:  Current Outpatient Medications:    COVID-19 At Home Antigen Test KIT, Use as directed to test for COVID-19, Disp: 1 kit, Rfl: 0   ondansetron (ZOFRAN-ODT) 4 MG disintegrating tablet, Take 1 tablet (4 mg total) by mouth every 8 (eight) hours as needed for nausea or vomiting., Disp: 20 tablet, Rfl: 0   apixaban (ELIQUIS) 5 MG TABS tablet, Take 1 tablet (5 mg total) by mouth 2 (two) times daily., Disp: 60 tablet, Rfl: 1   ferrous sulfate 325 (65 FE) MG EC tablet, Take 325 mg by mouth daily with breakfast., Disp: , Rfl:    oxyCODONE-acetaminophen (PERCOCET) 10-325 MG tablet, Take 1 tablet by mouth every 6 (six) hours as needed for pain., Disp: , Rfl:    rizatriptan (MAXALT-MLT) 10 MG disintegrating tablet, Take 1 tablet (10 mg total) by mouth as needed for migraine. May repeat in 2 hours if needed, Disp: 10 tablet, Rfl: 2  Observations/Objective: Patient is well-developed, well-nourished in no acute distress.  Resting comfortably at home.  Head is normocephalic, atraumatic.  No labored breathing. Speech is clear and coherent with logical content.  Patient is alert and oriented at baseline.   Assessment and Plan: 1. Viral illness - ondansetron  (ZOFRAN-ODT) 4 MG disintegrating tablet; Take 1 tablet (4 mg total) by mouth every 8 (eight) hours as needed for nausea or vomiting.  Dispense: 20 tablet; Refill: 0 - COVID-19 At Home Antigen Test KIT; Use as directed to test for COVID-19  Dispense: 1 kit; Refill: 0  Aches, headache, fatigue along with mild nasal congestion and GI symptoms. No alarm symptoms present. Giving exposures and current symptoms, needs COVID testing. She does not have a home test but agrees to get one. Message sent to MyChart so once she has tested and results are in, she can message our team directly. If positive, will discuss antiviral medications and quarantine. For now, she is to hydrate and rest. Start saline rinse. Can use OTC Mucinex, Imodium. BRAT diet reviewed. Rx zofran to help with nausea/vomiting. Work note provided for today and tomorrow. Will adjust if COVID-positive. Strict in-person evaluation precautions reviewed with patient.   Follow Up Instructions: I discussed the assessment and treatment plan with the patient. The patient was provided an opportunity to ask questions and all were answered. The patient agreed with the plan and demonstrated an understanding  of the instructions.  A copy of instructions were sent to the patient via MyChart unless otherwise noted below.   The patient was advised to call back or seek an in-person evaluation if the symptoms worsen or if the condition fails to improve as anticipated.  Time:  I spent 10 minutes with the patient via telehealth technology discussing the above problems/concerns.    Barbara Climes, PA-C

## 2023-07-22 NOTE — Patient Instructions (Signed)
[name], thank you for joining [name], PA-C for today's virtual visit.  While this provider is not your primary care provider (PCP), if your PCP is located in our provider database this encounter information will be shared with them immediately following your visit.   A [facility] MyChart account gives you access to today's visit and all your visits, tests, and labs performed at [facility] " click here if you don't have a [facility] MyChart account or go to [email or web address]  Consent: (Patient) [name] provided verbal consent for this virtual visit at the beginning of the encounter.  Current Medications:  Current Outpatient Medications:    apixaban (ELIQUIS) 5 MG TABS tablet, Take 1 tablet (5 mg total) by mouth 2 (two) times daily., Disp: 60 tablet, Rfl: 1   ferrous sulfate 325 (65 FE) MG EC tablet, Take 325 mg by mouth daily with breakfast., Disp: , Rfl:    ondansetron (ZOFRAN-ODT) 4 MG disintegrating tablet, Take 4 mg by mouth 2 (two) times daily as needed for nausea or vomiting., Disp: , Rfl: 2   oxyCODONE-acetaminophen (PERCOCET) 10-325 MG tablet, Take 1 tablet by mouth every 6 (six) hours as needed for pain., Disp: , Rfl:    rizatriptan (MAXALT-MLT) 10 MG disintegrating tablet, Take 1 tablet (10 mg total) by mouth as needed for migraine. May repeat in 2 hours if needed, Disp: 10 tablet, Rfl: 2   Medications ordered in this encounter:  No orders of the defined types were placed in this encounter.    *If you need refills on other medications prior to your next appointment, please contact your pharmacy*  Follow-Up: Call back or seek an in-person evaluation if the symptoms worsen or if the condition fails to improve as anticipated.  [facility] [phone]  Other Instructions Please take the home COVID test and message [place] with the results. If positive, we will call you to discuss further treatments including  discussion of antiviral therapies.   Regardless, stay well-hydrated and rest. Start a saline nasal rinse. You can use OTC Mucinex for congestion. Follow a bland diet -- see below. You can use Imodium OTC if needed to slow down frequency of bowel movements. The Zofran is to be used as directed, when needed for nausea and to prevent vomiting.  For now, you need to stay home through tomorrow.  If COVID test is positive we will discuss further isolation/time from work. If negative, you can return once symptoms are improved and we make sure no fever.  Bland Diet A bland diet may consist of soft foods or foods that are not high in fat or are not greasy, acidic, or spicy. Avoiding certain foods may cause less irritation to your mouth, throat, stomach, or gastrointestinal tract. Avoiding certain foods may make you feel better. Everyone's tolerances are different. A bland diet should be based on what you can tolerate and what may cause discomfort. What is my plan? Your health care provider or dietitian may recommend specific changes to your diet to treat your symptoms. These changes may include: Eating small meals frequently. Cooking food until it is soft enough to chew easily. Taking the time to chew your food thoroughly, so it is easy to swallow and digest. Avoiding foods that cause you discomfort. These may include spicy food, fried food, greasy foods, hard-to-chew foods, or citrus fruits and juices. Drinking slowly. What are tips for following this plan? Reading food labels To reduce fiber intake, look for  food labels that say "whole," such as whole wheat or whole grain. Shopping Avoid food items that may have nuts or seeds. Avoid vegetables that may make you gassy or have a tough texture, such as broccoli, cauliflower, or corn. Cooking Cook foods thoroughly so they have a soft texture. Meal planning Make sure you include foods from all food groups to eat a balanced diet. Eat a variety of  types of foods. Eat foods and drink beverages that do not cause you discomfort. These may include soups and broths with cooked meats, pasta, and vegetables. Lifestyle Sit up after meals, avoid tight clothing, and take time to eat and chew your food slowly. Ask your health care provider whether you should take dietary supplements. General information Mildly season your foods. Some seasonings, such as cayenne pepper, vinegar, or hot sauce, may cause irritation. The foods, beverages, or seasonings to avoid should be based on individual tolerance. What foods should I eat? Fruits Canned or cooked fruit such as peaches, pears, or applesauce. Bananas. Vegetables Well-cooked vegetables. Canned or cooked vegetables such as carrots, green beans, beets, or spinach. Mashed or boiled potatoes. Grains  Hot cereals, such as cream of wheat and processed oatmeal. Rice. Bread, crackers, pasta, or tortillas made from refined white flour. Meats and other proteins  Eggs. Creamy peanut butter or other nut butters. Lean, well-cooked tender meats, such as beef, pork, chicken, or fish. Dairy Low-fat dairy products such as milk, cottage cheese, or yogurt. Beverages  Water. Herbal tea. Apple juice. Fats and oils Mild salad dressings. Canola or olive oil. Sweets and desserts Low-fat pudding, custard, or ice cream. Fruit gelatin. The items listed above may not be a complete list of foods and beverages you can eat. Contact a dietitian for more information. What foods should I avoid? Fruits Citrus fruits, such as oranges and grapefruit. Fruits with a stringy texture. Fruits that have lots of seeds, such as kiwi or strawberries. Dried fruits. Vegetables Raw, uncooked vegetables. Salads. Grains Whole grain breads, muffins, and cereals. Meats and other proteins Tough, fibrous meats. Highly seasoned meat such as corned beef, smoked meats, or fish. Processed high-fat meats such as brats, hot dogs, or  sausage. Dairy Full-fat dairy foods such as ice cream and cheese. Beverages Caffeinated drinks. Alcohol. Seasonings and condiments Strongly flavored seasonings or condiments. Hot sauce. Salsa. Other foods Spicy foods. Fried or greasy foods. Sour foods, such as pickled or fermented foods like sauerkraut. Foods high in fiber. The items listed above may not be a complete list of foods and beverages you should avoid. Contact a dietitian for more information. Summary A bland diet should be based on individual tolerance. It may consist of foods that are soft textured and do not have a lot of fat, fiber, acid, or seasonings. A bland diet may be recommended because avoiding certain foods, beverages, or spices may make you feel better. This information is not intended to replace advice given to you by your health care provider. Make sure you discuss any questions you have with your health care provider. Document Revised: 10/15/2021 Document Reviewed: 10/15/2021 Elsevier Patient Education  2024 Elsevier Inc.    If you have been instructed to have an in-person evaluation today at a local Urgent Care facility, please use the link below. It will take you to a list of all of our available [facility], including address, phone number and hours of operation. Please do not delay care.  [facility]  If you or a  family member do not have a primary care provider, use the link below to schedule a visit and establish care. When you choose a [facility] primary care physician or advanced practice provider, you gain a long-term partner in health. Find a Primary Care Provider  Learn more about [facility] in-office and virtual care options: [facility] - Get Care Now

## 2023-07-23 ENCOUNTER — Telehealth: Payer: Self-pay

## 2023-07-23 NOTE — Telephone Encounter (Signed)
-----   Message from Gypsy Balsam sent at 07/23/2023  9:38 AM EDT ----- Echocardiogram was normal

## 2023-07-23 NOTE — Telephone Encounter (Signed)
Patient notified through my chart.

## 2023-07-23 NOTE — Telephone Encounter (Signed)
Pt viewed results in My Chart per Dr. Krasowski's note. Routed to PCP.  

## 2023-07-23 NOTE — Telephone Encounter (Signed)
Left message on My Chart with normal results per Dr. Krasowski's note. Routed to PCP. 

## 2023-08-13 IMAGING — CT CT ANGIO CHEST
2 of 6 series · 19 of 36 positions shown · IV contrast (agent unspecified)
Comparison: 11/01/2020

CLINICAL DATA: Left chest pain, shortness of breath

EXAM:
CT ANGIOGRAPHY CHEST WITH CONTRAST
TECHNIQUE: Multidetector CT imaging of the chest was performed using the
standard protocol during bolus administration of intravenous
contrast. Multiplanar CT image reconstructions and MIPs were
obtained to evaluate the vascular anatomy.

[Series 7: pe thins · axial · 0.63mm/px · z∈[+927,+1136]mm · 18 of 332 slices shown]
[im 17/332  lung]
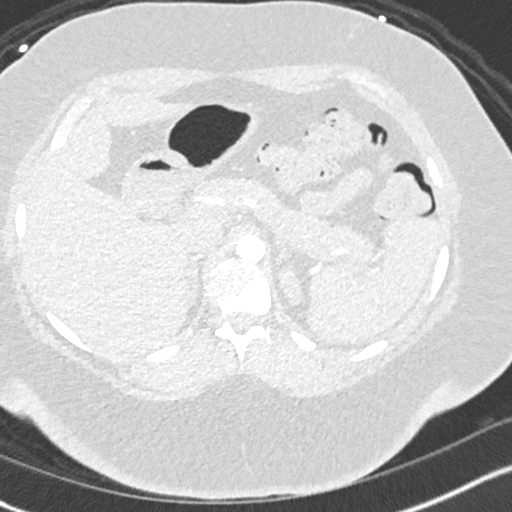
[im 34/332  mediastinal]
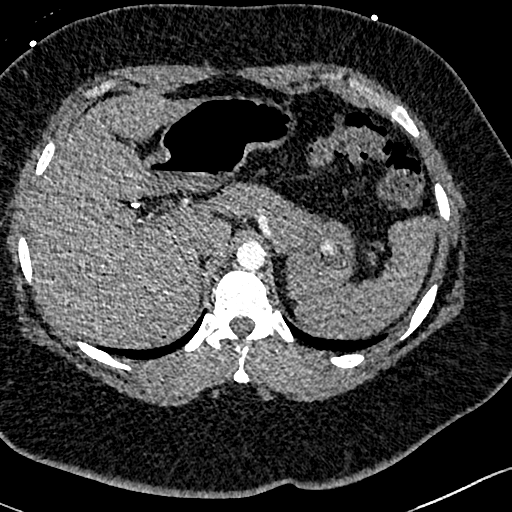
[im 50/332  lung]
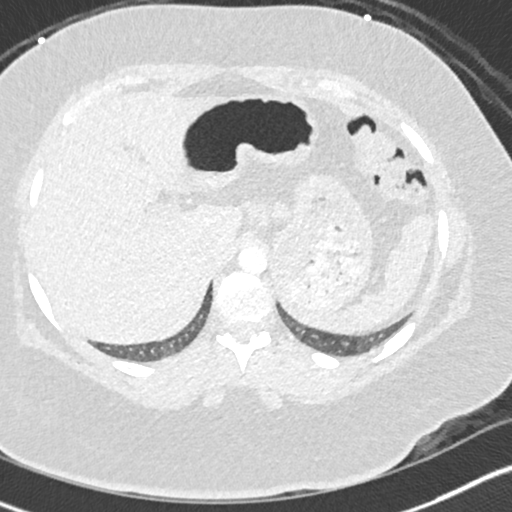
[im 67/332  mediastinal]
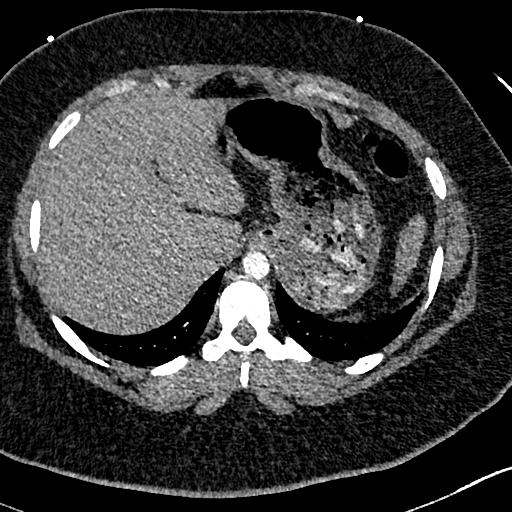
[im 83/332  lung]
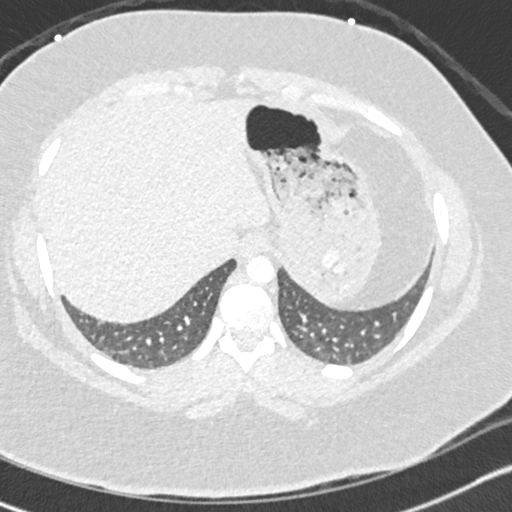
[im 100/332  mediastinal]
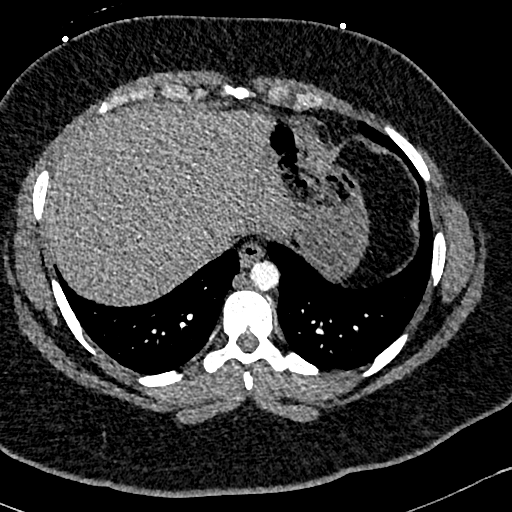
[im 116/332  lung]
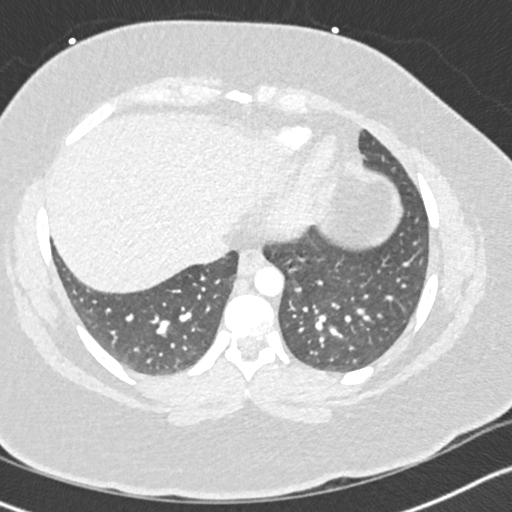
[im 133/332  mediastinal]
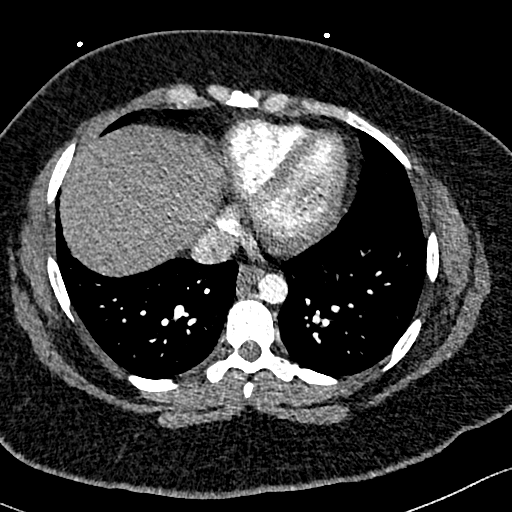
[im 149/332  lung]
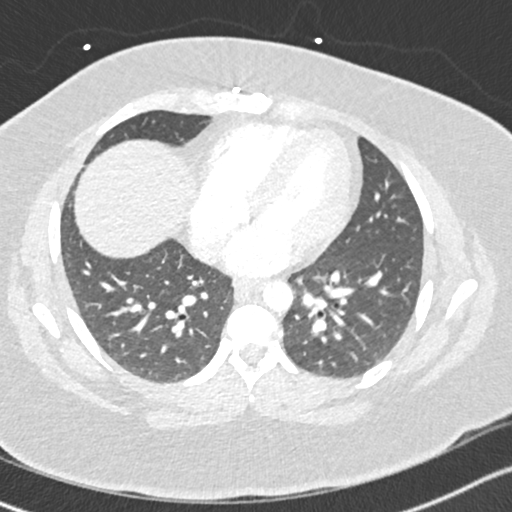
[im 183/332  mediastinal]
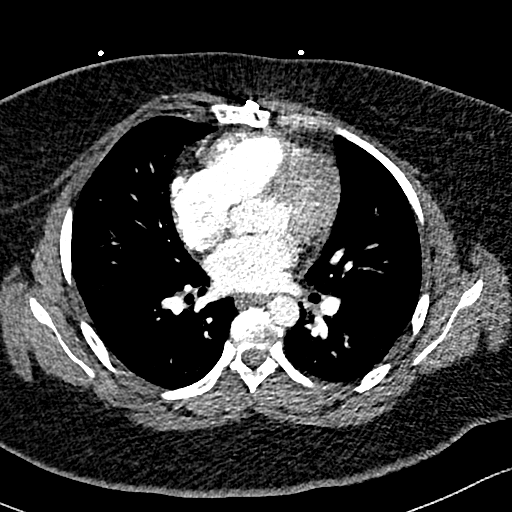
[im 199/332  lung]
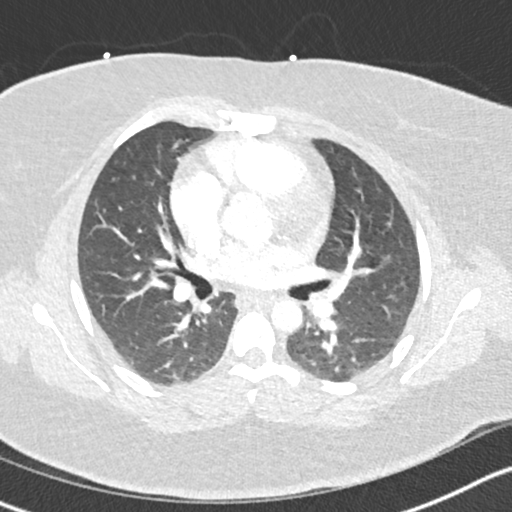
[im 216/332  mediastinal]
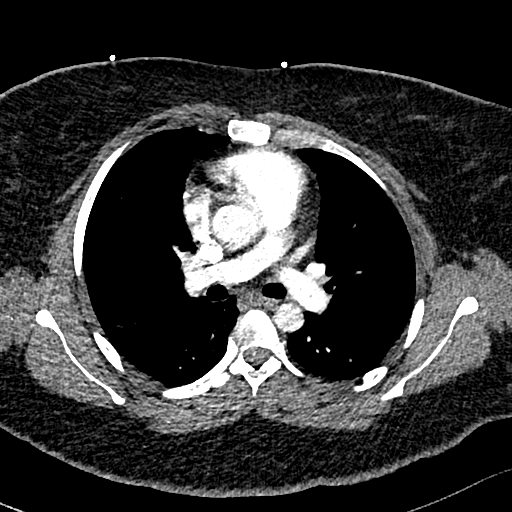
[im 232/332  lung]
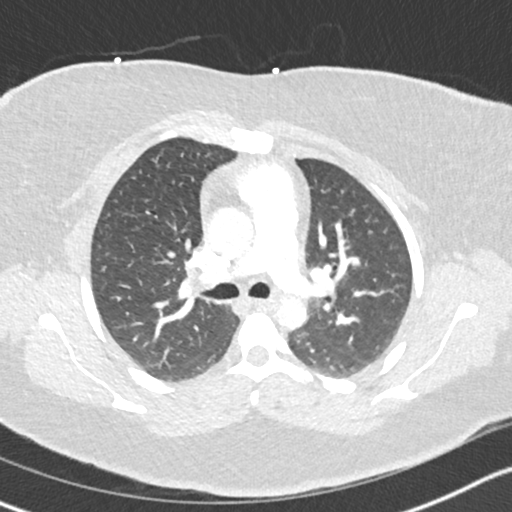
[im 249/332  mediastinal]
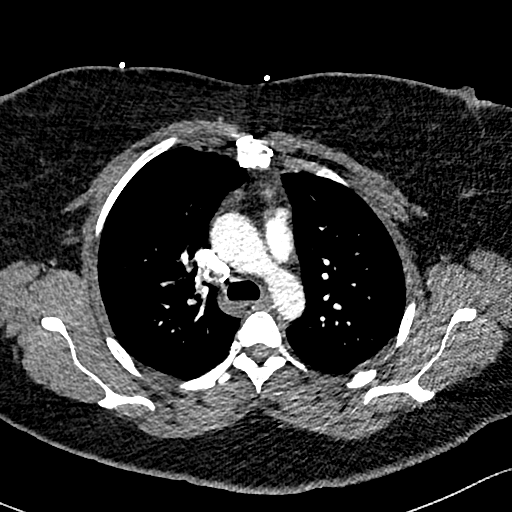
[im 265/332  lung]
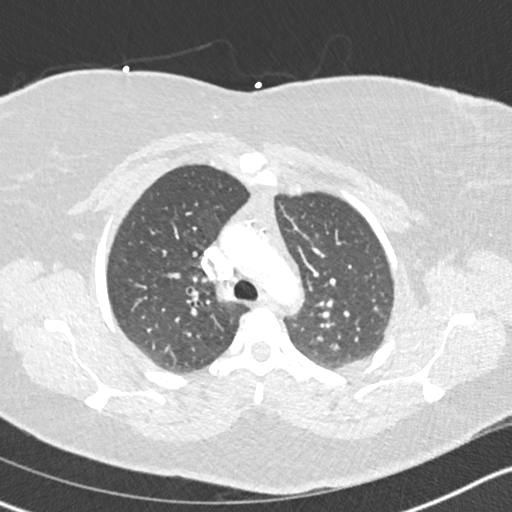
[im 282/332  mediastinal]
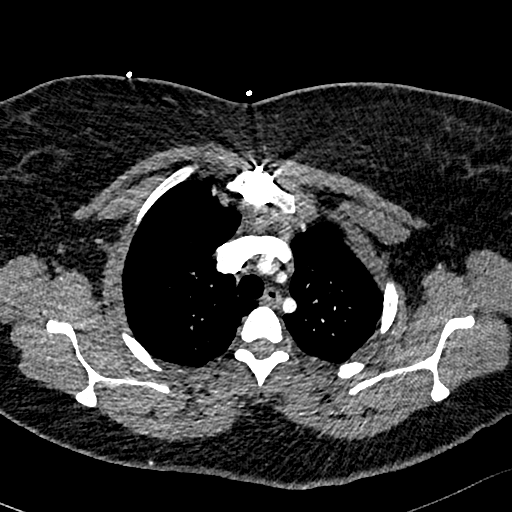
[im 298/332  lung]
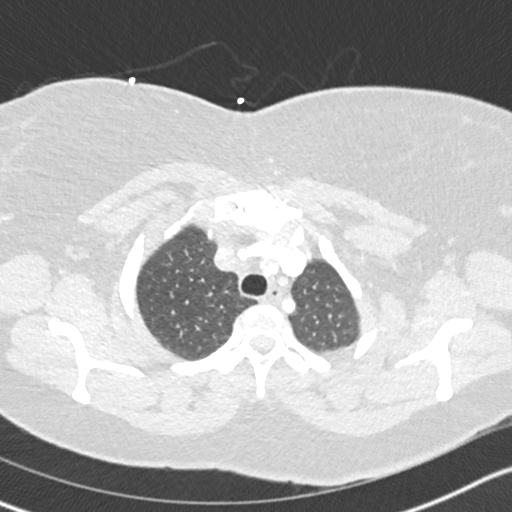
[im 315/332  mediastinal]
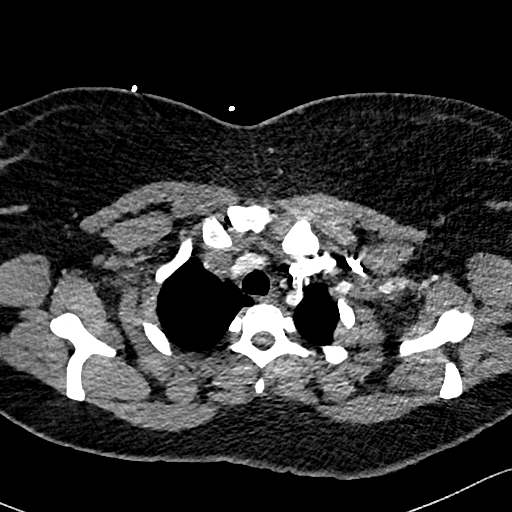

[Series 8: pe 2mm cor · coronal · 0.46mm/px · 1 of 165 slices shown]
[im 83/165  mediastinal]
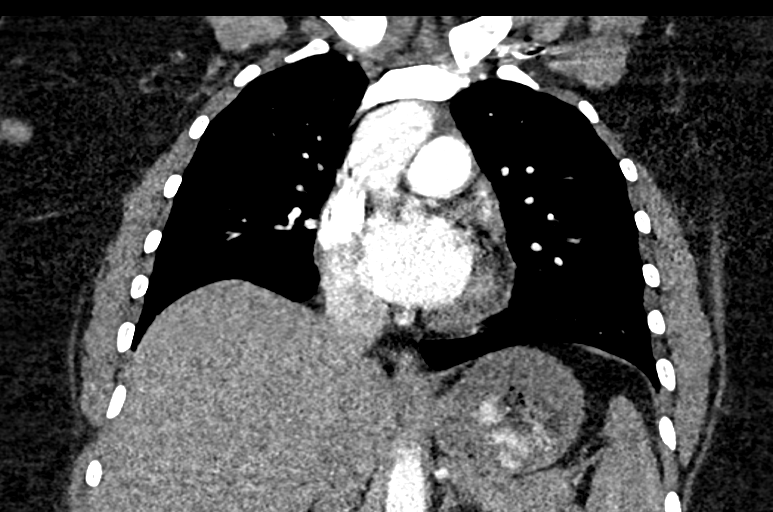

[19 of 36 positions shown; findings below may reference images not displayed]

RADIATION DOSE REDUCTION: This exam was performed according to the
departmental dose-optimization program which includes automated
exposure control, adjustment of the mA and/or kV according to
patient size and/or use of iterative reconstruction technique.

CONTRAST:  55mL OMNIPAQUE IOHEXOL 350 MG/ML SOLN
FINDINGS: Cardiovascular: There is homogeneous enhancement in thoracic aorta.
There are no intraluminal filling defects in pulmonary artery
branches. There is previous coronary bypass surgery.

Mediastinum/Nodes: Unremarkable.

Lungs/Pleura: There is no focal pulmonary consolidation. There is no
pleural effusion or pneumothorax.

Upper Abdomen: Surgical clips are seen in gallbladder fossa.

Musculoskeletal: Unremarkable.

Review of the MIP images confirms the above findings.
IMPRESSION: There is no evidence of pulmonary artery embolism. There is no
evidence of thoracic aortic dissection. There is no focal pulmonary
consolidation.

## 2023-08-14 ENCOUNTER — Telehealth: Payer: Self-pay

## 2023-08-14 NOTE — Telephone Encounter (Signed)
Patient notified on my chart. Awaiting her response where she want to come in sooner.

## 2023-08-14 NOTE — Telephone Encounter (Signed)
-----   Message from Gypsy Balsam sent at 08/13/2023  2:30 PM EDT ----- Regarding: RE: My chart message All those findings insignificant, the usually do not progress, monitor was normal without any significant arrhythmia.  She does have appoint with me in November but if she wants to meet sooner just to discuss this the morning what to do that ----- Message ----- From: Heywood Bene, CMA Sent: 08/13/2023   9:26 AM EDT To: Georgeanna Lea, MD Subject: My chart message                               Patient message this after reviewing her results with her through my chart:   So multiple valve regurgitation, thickening of heart muscle, aortic valve sclerosis and the hole between the atriums are normal? Nothing I should worry about? No treatment plan to help prevent worsening ? Doesn't relate to the symptoms I've been having ? I need clarification because to me normal would mean nothing was found. What about the heart monitor results ?   I reassured her all test are normal. I think she want to know why is she symptomatic. Please advise

## 2023-08-16 DIAGNOSIS — N915 Oligomenorrhea, unspecified: Secondary | ICD-10-CM | POA: Diagnosis not present

## 2023-08-16 DIAGNOSIS — N9489 Other specified conditions associated with female genital organs and menstrual cycle: Secondary | ICD-10-CM | POA: Diagnosis not present

## 2023-08-16 DIAGNOSIS — Z79899 Other long term (current) drug therapy: Secondary | ICD-10-CM | POA: Diagnosis not present

## 2023-08-16 DIAGNOSIS — E7800 Pure hypercholesterolemia, unspecified: Secondary | ICD-10-CM | POA: Diagnosis not present

## 2023-08-16 DIAGNOSIS — E559 Vitamin D deficiency, unspecified: Secondary | ICD-10-CM | POA: Diagnosis not present

## 2023-08-16 DIAGNOSIS — D689 Coagulation defect, unspecified: Secondary | ICD-10-CM | POA: Diagnosis not present

## 2023-08-16 DIAGNOSIS — E538 Deficiency of other specified B group vitamins: Secondary | ICD-10-CM | POA: Diagnosis not present

## 2023-08-16 DIAGNOSIS — Z6841 Body Mass Index (BMI) 40.0 and over, adult: Secondary | ICD-10-CM | POA: Diagnosis not present

## 2023-08-16 DIAGNOSIS — Z0000 Encounter for general adult medical examination without abnormal findings: Secondary | ICD-10-CM | POA: Diagnosis not present

## 2023-08-16 DIAGNOSIS — F17210 Nicotine dependence, cigarettes, uncomplicated: Secondary | ICD-10-CM | POA: Diagnosis not present

## 2023-08-16 DIAGNOSIS — E611 Iron deficiency: Secondary | ICD-10-CM | POA: Diagnosis not present

## 2023-08-22 DIAGNOSIS — Z79899 Other long term (current) drug therapy: Secondary | ICD-10-CM | POA: Diagnosis not present

## 2023-08-27 ENCOUNTER — Other Ambulatory Visit: Payer: Self-pay

## 2023-08-27 ENCOUNTER — Ambulatory Visit
Admission: RE | Admit: 2023-08-27 | Discharge: 2023-08-27 | Disposition: A | Discharge status: Home / Self Care | Payer: Medicaid Other | Source: Ambulatory Visit | Attending: Internal Medicine | Admitting: Internal Medicine

## 2023-08-27 VITALS — BP 107/71 | HR 74 | Temp 98.4°F | Resp 18 | Ht 63.0 in | Wt 230.0 lb

## 2023-08-27 DIAGNOSIS — Z7901 Long term (current) use of anticoagulants: Secondary | ICD-10-CM | POA: Diagnosis not present

## 2023-08-27 DIAGNOSIS — F17290 Nicotine dependence, other tobacco product, uncomplicated: Secondary | ICD-10-CM | POA: Diagnosis not present

## 2023-08-27 DIAGNOSIS — R079 Chest pain, unspecified: Secondary | ICD-10-CM | POA: Diagnosis not present

## 2023-08-27 NOTE — ED Provider Notes (Addendum)
[name]    CSN: [id number] Arrival date & time: 08/27/23  1103      History   Chief Complaint Chief Complaint  Patient presents with   Chest Injury    Worried I may have pneumonia or popcorn lung from vaping! - Entered by patient    HPI [name] is a 32 y.o. female.   [name] is a 32 y.o. female presenting for chief complaint of left sided chest pain that is described as a tightness and pressure for the last few days. Chest pain is localized to the left upper chest and radiates through to he left upper back intermittently. Pain is constant, currently 6/10, and there are no identifiable triggering or relieving factors for pain. Deep breathing, movement, position changes do not change pain. Eating does not change pain. She has GERD at baseline and takes Mylanta as needed at home, clarifies this "does not feel the same as GERD". No recent cough, viral URI symptoms, trauma/injury to the chest wall, increase in exercise, rash, N/V/D, abdominal pain, headaches, fever/chills, or radicular chest pain to the left arm/jaw. History of PFO, takes eloquis daily. Quit smoking cigarettes 9 months ago, currently vapes nicotine. Takes oxycodone for chronic pelvic pain associated with endometriosis/PCOS, this has not helped with chest pain. On further questioning, she remembers she recently had her arms up above her head for an extended period of time shortly before pain started and wonders if this may have caused pain to the chest.      Past Medical History:  Diagnosis Date   Asthma    Clotting disorder (HCC)    per 01/09/22 OV note from Dr. Bing Matter, pt has some coagulopathy. However extensive hematological work-up has been unrevealing.   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   TIA (transient ischemic attack) 2018   right-sided weakness   Tobacco abuse    UTI (urinary tract infection)     Patient Active Problem List   Diagnosis Date Noted   Syncope and collapse 06/26/2023   Screening-pulmonary TB 05/15/2023   Epigastric abdominal pain 06/28/2022   Current every day vaping 04/12/2022   Encounter for chronic pain management 04/04/2022   Constipation 01/08/2022   Gastroesophageal reflux disease without esophagitis 12/26/2021   History of iron deficiency 12/26/2021   COVID 07/2020   Pelvic pain in female 08/11/2018   Abnormal vaginal bleeding 08/05/2018   Nicotine addiction 03/13/2018   Abnormal uterine and vaginal bleeding, unspecified 04/17/2017   Adnexal fullness 04/17/2017   Lower abdominal pain 04/17/2017   Dysphagia    Paresthesia    Noncompliance    Clotting disorder (HCC)    TIA (transient ischemic attack) 03/17/2017   Right sided weakness 03/17/2017   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   Right leg weakness 10/14/2016   Chest pain 07/07/2016   Numbness on right side 07/07/2016   Depression    PTSD (post-traumatic stress disorder)    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    Tobacco abuse    Thrombocytosis 12/06/2014  PCOS (polycystic ovarian syndrome) 07/20/2014   Migraines 07/20/2014   Anticoagulation management encounter 02/10/2014   DVT (deep venous thrombosis) (HCC) 11/29/2013   Palpitations 05/04/2013   History of PCOS 03/23/2013   History of DVT of lower extremity, 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    Past Surgical History:  Procedure Laterality Date   btl     CARDIAC SURGERY  2014   thrombectomy to remove thrombi in right atrium   CHOLECYSTECTOMY     TEE WITHOUT CARDIOVERSION N/A 07/09/2016   Procedure: TRANSESOPHAGEAL ECHOCARDIOGRAM  (TEE);  Surgeon: Orpah Cobb, MD;  Location[facility] ENDOSCOPY;  Service: Cardiovascular;  Laterality: N/A;   TEE WITHOUT CARDIOVERSION N/A 03/19/2017   Procedure: TRANSESOPHAGEAL ECHOCARDIOGRAM (TEE);  Surgeon: Orpah Cobb, MD;  Location: [facility] ENDOSCOPY;  Service: Cardiovascular;  Laterality: N/A;    OB History     Gravida  2   Para  2   Term      Preterm      AB      Living  2      SAB      IAB      Ectopic      Multiple      Live Births  2            Home Medications    Prior to Admission medications   Medication Sig Start Date End Date Taking? Authorizing Provider  apixaban (ELIQUIS) 5 MG TABS tablet Take 1 tablet (5 mg total) by mouth 2 (two) times daily. 03/26/17  Yes Richarda Overlie, MD  COVID-19 At Home Antigen Test KIT Use as directed to test for COVID-19 07/22/23  Yes Waldon Merl, PA-C  ferrous sulfate 325 (65 FE) MG EC tablet Take 325 mg by mouth daily with breakfast.   Yes [provider]  ondansetron (ZOFRAN-ODT) 4 MG disintegrating tablet Take 1 tablet (4 mg total) by mouth every 8 (eight) hours as needed for nausea or vomiting. 07/22/23  Yes Waldon Merl, PA-C  oxyCODONE-acetaminophen (PERCOCET) 10-325 MG tablet Take 1 tablet by mouth every 6 (six) hours as needed for pain.   Yes [provider]  rizatriptan (MAXALT-MLT) 10 MG disintegrating tablet Take 1 tablet (10 mg total) by mouth as needed for migraine. May repeat in 2 hours if needed 04/04/22  Yes Metheney, Barbarann Ehlers, MD    Family History Family History  Problem Relation Age of Onset   Bipolar disorder Mother    Glaucoma Mother    Arthritis Father    Lupus Sister    Healthy Sister    Healthy Sister    Healthy Brother     Social History Social History   Tobacco Use   Smoking status: Some Days    Types: Cigarettes   Smokeless tobacco: Current   Tobacco comments:    Vape   Vaping Use   Vaping status: Every Day  Substance Use Topics   Alcohol use: No   Drug  use: No     Allergies   Ceftriaxone, Gabapentin, Levofloxacin, Morphine, Nsaids, and Vicodin [hydrocodone-acetaminophen]   Review of Systems Review of Systems Per HPI  Physical Exam Triage Vital Signs ED Triage Vitals  Encounter Vitals Group     BP 08/27/23 1132 107/71     Systolic BP Percentile --      Diastolic BP Percentile --      Pulse Rate 08/27/23 1132 74     Resp 08/27/23 1132 18     Temp 08/27/23 1132 98.4 F (36.9 C)  Temp Source 08/27/23 1132 Oral     SpO2 08/27/23 1132 96 %     Weight 08/27/23 1133 230 lb (104.3 kg)     Height 08/27/23 1133 5\' 3"  (1.6 m)     Head Circumference --      Peak Flow --      Pain Score 08/27/23 1133 6     Pain Loc --      Pain Education --      Exclude from Growth Chart --    No data found.  Updated Vital Signs BP 107/71 (BP Location: Right Arm)   Pulse 74   Temp 98.4 F (36.9 C) (Oral)   Resp 18   Ht 5\' 3"  (1.6 m)   Wt 230 lb (104.3 kg)   LMP 07/29/2023 (Exact Date)   SpO2 96%   BMI 40.74 kg/m   Visual Acuity Right Eye Distance:   Left Eye Distance:   Bilateral Distance:    Right Eye Near:   Left Eye Near:    Bilateral Near:     Physical Exam Vitals and nursing note reviewed.  Constitutional:      Appearance: She is not ill-appearing or toxic-appearing.     Comments: Well-appearing female seated in position of comfort in no acute distress.  HENT:     Head: Normocephalic and atraumatic.     Right Ear: Hearing, tympanic membrane, ear canal and external ear normal.     Left Ear: Hearing, tympanic membrane, ear canal and external ear normal.     Nose: Nose normal.     Mouth/Throat:     Lips: Pink.     Mouth: Mucous membranes are moist. No injury.     Tongue: No lesions. Tongue does not deviate from midline.     Palate: No mass and lesions.     Pharynx: Oropharynx is clear. Uvula midline. No pharyngeal swelling, oropharyngeal exudate, posterior oropharyngeal erythema or uvula swelling.     Tonsils: No  tonsillar exudate or tonsillar abscesses.  Eyes:     General: Lids are normal. Vision grossly intact. Gaze aligned appropriately.     Extraocular Movements: Extraocular movements intact.     Conjunctiva/sclera: Conjunctivae normal.  Cardiovascular:     Rate and Rhythm: Normal rate and regular rhythm.     Heart sounds: Normal heart sounds, S1 normal and S2 normal.  Pulmonary:     Effort: Pulmonary effort is normal. No respiratory distress.     Breath sounds: Normal breath sounds and air entry. No wheezing, rhonchi or rales.  Chest:     Chest wall: No tenderness (Non-tender to palpation of the chest wall and left upper thoracic spine).  Musculoskeletal:     Cervical back: Neck supple.     Right lower leg: No edema.     Left lower leg: No edema.  Skin:    General: Skin is warm and dry.     Capillary Refill: Capillary refill takes less than 2 seconds.     Findings: No rash.  Neurological:     General: No focal deficit present.     Mental Status: She is alert and oriented to person, place, and time. Mental status is at baseline.     Cranial Nerves: No dysarthria or facial asymmetry.  Psychiatric:        Mood and Affect: Mood normal.        Speech: Speech normal.        Behavior: Behavior normal.  Thought Content: Thought content normal.        Judgment: Judgment normal.      UC Treatments / Results  Labs (all labs ordered are listed, but only abnormal results are displayed) Labs Reviewed - No data to display  EKG   Radiology No results found.  Procedures Procedures (including critical care time)  Medications Ordered in UC Medications - No data to display  Initial Impression / Assessment and Plan / UC Course  I have reviewed the triage vital signs and the nursing notes.  Pertinent labs & imaging results that were available during my care of the patient were reviewed by me and considered in my medical decision making (see chart for details).   1. Chest pain,  chronic anticoagulation, nicotine dependence Unclear etiology of patient's chest discomfort. She is clinically well appearing. Low suspicion for GERD etiology and cardiac etiology. EKG shows normal sinus rhythm without ST/T wave changes, similar to previous EKG on file. Offered GI cocktail, patient declined.  Patient concerned for pneumonia/popcorn lung, low suspicion for this given clear cardiopulmonary exam and hemodynamically stable vital signs, therefore deferred chest x-ray.  Will manage this as muscular chest discomfort with as needed use of heat, gentle ROM exercises, and tylenol as needed for pain. PCP follow-up encouraged in 2-3 days to discuss further. Strict ER return precautions discussed.  Patient declined AVS.  Counseled patient on potential for adverse effects with medications prescribed/recommended today, strict ER and return-to-clinic precautions discussed, patient verbalized understanding.    Final Clinical Impressions(s) / UC Diagnoses   Final diagnoses:  Chest pain, unspecified type  Chronic anticoagulation  Other tobacco product nicotine dependence, uncomplicated   Discharge Instructions   None    ED Prescriptions   None    PDMP not reviewed this encounter.   Carlisle Beers, FNP 08/27/23 1310    Reita May [place], [place] 08/27/23 1311

## 2023-08-27 NOTE — ED Triage Notes (Signed)
Patient c/o left sided chest pain x 3 days.  No SOB, no injury, but patient does vape.  She is unsure if she got "popcorn lung" from vaping.  Patient has taken Oxycodone for pain w/o relief.

## 2023-09-12 DIAGNOSIS — D689 Coagulation defect, unspecified: Secondary | ICD-10-CM | POA: Diagnosis not present

## 2023-09-12 DIAGNOSIS — N915 Oligomenorrhea, unspecified: Secondary | ICD-10-CM | POA: Diagnosis not present

## 2023-09-12 DIAGNOSIS — E559 Vitamin D deficiency, unspecified: Secondary | ICD-10-CM | POA: Diagnosis not present

## 2023-09-12 DIAGNOSIS — R109 Unspecified abdominal pain: Secondary | ICD-10-CM | POA: Diagnosis not present

## 2023-09-12 DIAGNOSIS — N9489 Other specified conditions associated with female genital organs and menstrual cycle: Secondary | ICD-10-CM | POA: Diagnosis not present

## 2023-09-12 DIAGNOSIS — E611 Iron deficiency: Secondary | ICD-10-CM | POA: Diagnosis not present

## 2023-09-12 DIAGNOSIS — Z6841 Body Mass Index (BMI) 40.0 and over, adult: Secondary | ICD-10-CM | POA: Diagnosis not present

## 2023-09-12 DIAGNOSIS — F17210 Nicotine dependence, cigarettes, uncomplicated: Secondary | ICD-10-CM | POA: Diagnosis not present

## 2023-09-12 DIAGNOSIS — Z79899 Other long term (current) drug therapy: Secondary | ICD-10-CM | POA: Diagnosis not present

## 2023-09-12 DIAGNOSIS — E7800 Pure hypercholesterolemia, unspecified: Secondary | ICD-10-CM | POA: Diagnosis not present

## 2023-09-23 DIAGNOSIS — Z951 Presence of aortocoronary bypass graft: Secondary | ICD-10-CM | POA: Diagnosis not present

## 2023-09-23 DIAGNOSIS — R079 Chest pain, unspecified: Secondary | ICD-10-CM | POA: Diagnosis not present

## 2023-09-23 DIAGNOSIS — R0789 Other chest pain: Secondary | ICD-10-CM | POA: Diagnosis not present

## 2023-09-23 DIAGNOSIS — R001 Bradycardia, unspecified: Secondary | ICD-10-CM | POA: Diagnosis not present

## 2023-09-23 DIAGNOSIS — Z86718 Personal history of other venous thrombosis and embolism: Secondary | ICD-10-CM | POA: Diagnosis not present

## 2023-09-23 DIAGNOSIS — Z7901 Long term (current) use of anticoagulants: Secondary | ICD-10-CM | POA: Diagnosis not present

## 2023-10-07 DIAGNOSIS — M9902 Segmental and somatic dysfunction of thoracic region: Secondary | ICD-10-CM | POA: Diagnosis not present

## 2023-10-07 DIAGNOSIS — M9901 Segmental and somatic dysfunction of cervical region: Secondary | ICD-10-CM | POA: Diagnosis not present

## 2023-10-07 DIAGNOSIS — M9904 Segmental and somatic dysfunction of sacral region: Secondary | ICD-10-CM | POA: Diagnosis not present

## 2023-10-07 DIAGNOSIS — M9903 Segmental and somatic dysfunction of lumbar region: Secondary | ICD-10-CM | POA: Diagnosis not present

## 2023-10-07 DIAGNOSIS — M5413 Radiculopathy, cervicothoracic region: Secondary | ICD-10-CM | POA: Diagnosis not present

## 2023-10-11 DIAGNOSIS — R109 Unspecified abdominal pain: Secondary | ICD-10-CM | POA: Diagnosis not present

## 2023-10-11 DIAGNOSIS — Z20822 Contact with and (suspected) exposure to covid-19: Secondary | ICD-10-CM | POA: Diagnosis not present

## 2023-10-11 DIAGNOSIS — N9489 Other specified conditions associated with female genital organs and menstrual cycle: Secondary | ICD-10-CM | POA: Diagnosis not present

## 2023-10-11 DIAGNOSIS — N915 Oligomenorrhea, unspecified: Secondary | ICD-10-CM | POA: Diagnosis not present

## 2023-10-11 DIAGNOSIS — E559 Vitamin D deficiency, unspecified: Secondary | ICD-10-CM | POA: Diagnosis not present

## 2023-10-11 DIAGNOSIS — F17210 Nicotine dependence, cigarettes, uncomplicated: Secondary | ICD-10-CM | POA: Diagnosis not present

## 2023-10-11 DIAGNOSIS — Z79899 Other long term (current) drug therapy: Secondary | ICD-10-CM | POA: Diagnosis not present

## 2023-10-11 DIAGNOSIS — E7800 Pure hypercholesterolemia, unspecified: Secondary | ICD-10-CM | POA: Diagnosis not present

## 2023-10-11 DIAGNOSIS — D689 Coagulation defect, unspecified: Secondary | ICD-10-CM | POA: Diagnosis not present

## 2023-10-11 DIAGNOSIS — E538 Deficiency of other specified B group vitamins: Secondary | ICD-10-CM | POA: Diagnosis not present

## 2023-10-11 DIAGNOSIS — R110 Nausea: Secondary | ICD-10-CM | POA: Diagnosis not present

## 2023-10-11 DIAGNOSIS — E611 Iron deficiency: Secondary | ICD-10-CM | POA: Diagnosis not present

## 2023-10-11 DIAGNOSIS — Z6841 Body Mass Index (BMI) 40.0 and over, adult: Secondary | ICD-10-CM | POA: Diagnosis not present

## 2023-10-13 ENCOUNTER — Telehealth: Payer: Medicaid Other | Admitting: Physician Assistant

## 2023-10-13 ENCOUNTER — Telehealth: Payer: Medicaid Other | Admitting: Emergency Medicine

## 2023-10-13 DIAGNOSIS — J069 Acute upper respiratory infection, unspecified: Secondary | ICD-10-CM | POA: Diagnosis not present

## 2023-10-13 DIAGNOSIS — Z5321 Procedure and treatment not carried out due to patient leaving prior to being seen by health care provider: Secondary | ICD-10-CM

## 2023-10-13 NOTE — Progress Notes (Signed)
Virtual Visit Consent   [name], you are scheduled for a virtual visit with a [facility] provider today. Just as with appointments in the office, your consent must be obtained to participate. Your consent will be active for this visit and any virtual visit you may have with one of our providers in the next 365 days. If you have a MyChart account, a copy of this consent can be sent to you electronically.  As this is a virtual visit, video technology does not allow for your provider to perform a traditional examination. This may limit your provider's ability to fully assess your condition. If your provider identifies any concerns that need to be evaluated in person or the need to arrange testing (such as labs, EKG, etc.), we will make arrangements to do so. Although advances in technology are sophisticated, we cannot ensure that it will always work on either your end or our end. If the connection with a video visit is poor, the visit may have to be switched to a telephone visit. With either a video or telephone visit, we are not always able to ensure that we have a secure connection.  By engaging in this virtual visit, you consent to the provision of healthcare and authorize for your insurance to be billed (if applicable) for the services provided during this visit. Depending on your insurance coverage, you may receive a charge related to this service.  I need to obtain your verbal consent now. Are you willing to proceed with your visit today? [name] has provided verbal consent on 10/13/2023 for a virtual visit (video or telephone). Cathlyn Parsons, NP  Date: 10/13/2023 1:50 PM  Virtual Visit via Video Note   I, Cathlyn Parsons, connected with  [name]  ([id number], 09-21-1991) on 10/13/23 at  1:15 PM EST by a video-enabled telemedicine application and verified that I am speaking with the correct person using two identifiers.  Location: Patient: Virtual Visit  Location Patient: Other: work Provider: Pharmacist, community: Home Office   I discussed the limitations of evaluation and management by telemedicine and the availability of in person appointments. The patient expressed understanding and agreed to proceed.    History of Present Illness: [name] is a 32 y.o. who identifies as a female who was assigned female at birth, and is being seen today for nasal congestion and post nasal drainage. Works Mondays and Thursdays and missed some work days over the last 2 weeks due to GI symptoms. Is back at work today. Needs a note to ok her absences during previous weeks.   Thinks she had foot poisoning from sushi 10/22 - vomited multiple times that day. Felt better 10/23, then worse again with more vomiting 10/24. Also had diarrhea during this time. Missed work again 10/28 as she still had vomiting. Has rx for zofran as she is on chronic pain medicine that sometimes makes her nauseated. Took zofran 8mg  po q8 hours for 2 days starting 10/28. HAs not had further n/v since. No further diarrhea in last week but stool has been looser than normal - is prone to constipation. Feels she has recovered from GI illness.   Kids had "the crud" and now pt has congestion x2 days. No taking any meds to help manage. Was seen by outside provider 11/2 and tested negtaive for strep, flu, and covid  Denies any fever during last 2 weeks  HPI: HPI  Problems:  Patient Active Problem List  Diagnosis Date Noted   Syncope and collapse 06/26/2023   Screening-pulmonary TB 05/15/2023   Epigastric abdominal pain 06/28/2022   Current every day vaping 04/12/2022   Encounter for chronic pain management 04/04/2022   Constipation 01/08/2022   Gastroesophageal reflux disease without esophagitis 12/26/2021   History of iron deficiency 12/26/2021   COVID 07/2020   Pelvic pain in female 08/11/2018   Abnormal vaginal bleeding 08/05/2018   Nicotine addiction 03/13/2018    Abnormal uterine and vaginal bleeding, unspecified 04/17/2017   Adnexal fullness 04/17/2017   Lower abdominal pain 04/17/2017   Dysphagia    Paresthesia    Noncompliance    Clotting disorder (HCC)    TIA (transient ischemic attack) 03/17/2017   Right sided weakness 03/17/2017   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   Right leg weakness 10/14/2016   Chest pain 07/07/2016   Numbness on right side 07/07/2016   Depression    PTSD (post-traumatic stress disorder)    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    Tobacco abuse    Thrombocytosis 12/06/2014   PCOS (polycystic ovarian syndrome) 07/20/2014   Migraines 07/20/2014   Anticoagulation management encounter 02/10/2014   DVT (deep venous thrombosis) (HCC) 11/29/2013   Palpitations 05/04/2013   History of PCOS 03/23/2013   History of DVT of lower extremity, 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    Allergies:  Allergies  Allergen Reactions   Ceftriaxone Itching and Other (See Comments)    Red man syndrome   Gabapentin Other (See Comments)    Confusion/ chest pains   Levofloxacin Other (See Comments)    Severe abdominal pain  Other Reaction(s): GI Intolerance   Morphine Itching and Nausea Only    Other Reaction(s): dizziness, GI Intolerance   Nsaids Other (See Comments)    Pt told by hematologist not to take   Vicodin [Hydrocodone-Acetaminophen] Other (See Comments)    hallucinations   Medications:  Current Outpatient Medications:    apixaban (ELIQUIS) 5 MG TABS tablet, Take 1 tablet (5 mg total) by mouth 2 (two) times daily., Disp: 60 tablet, Rfl: 1   COVID-19 At Home Antigen Test KIT, Use as directed to test for COVID-19, Disp: 1 kit, Rfl: 0   ferrous sulfate 325 (65 FE) MG EC tablet, Take 325 mg by mouth daily with breakfast., Disp: , Rfl:    ondansetron (ZOFRAN-ODT) 4 MG disintegrating tablet, Take 1 tablet (4 mg total) by mouth every 8 (eight) hours as needed for nausea or vomiting.,  Disp: 20 tablet, Rfl: 0   oxyCODONE-acetaminophen (PERCOCET) 10-325 MG tablet, Take 1 tablet by mouth every 6 (six) hours as needed for pain., Disp: , Rfl:    rizatriptan (MAXALT-MLT) 10 MG disintegrating tablet, Take 1 tablet (10 mg total) by mouth as needed for migraine. May repeat in 2 hours if needed, Disp: 10 tablet, Rfl: 2  Observations/Objective: Patient is well-developed, well-nourished in no acute distress.  Resting comfortably  at work.  Head is normocephalic, atraumatic.  No labored breathing.  Speech is clear and coherent with logical content.  Patient is alert and oriented at baseline.    Assessment and Plan: 1. Upper respiratory tract infection, unspecified type  Discussed supportive care. Given note stating ok to be at work today  Follow Up Instructions: I discussed the assessment and treatment plan with the patient. The patient was provided an opportunity to ask questions and all were answered. The patient agreed with the plan and demonstrated an understanding of the instructions.  A copy  of instructions were sent to the patient via MyChart unless otherwise noted below.   The patient was advised to call back or seek an in-person evaluation if the symptoms worsen or if the condition fails to improve as anticipated.    Cathlyn Parsons, NP

## 2023-10-13 NOTE — Patient Instructions (Signed)
  [name], thank you for joining Barbara Parsons, NP for today's virtual visit.  While this provider is not your primary care provider (PCP), if your PCP is located in our provider database this encounter information will be shared with them immediately following your visit.   A [name] MyChart account gives you access to today's visit and all your visits, tests, and labs performed at [facility] " click here if you don't have a [name] MyChart account or go to [email or web address]  Consent: (Patient) [name] provided verbal consent for this virtual visit at the beginning of the encounter.  Current Medications:  Current Outpatient Medications:    apixaban (ELIQUIS) 5 MG TABS tablet, Take 1 tablet (5 mg total) by mouth 2 (two) times daily., Disp: 60 tablet, Rfl: 1   COVID-19 At Home Antigen Test KIT, Use as directed to test for COVID-19, Disp: 1 kit, Rfl: 0   ferrous sulfate 325 (65 FE) MG EC tablet, Take 325 mg by mouth daily with breakfast., Disp: , Rfl:    ondansetron (ZOFRAN-ODT) 4 MG disintegrating tablet, Take 1 tablet (4 mg total) by mouth every 8 (eight) hours as needed for nausea or vomiting., Disp: 20 tablet, Rfl: 0   oxyCODONE-acetaminophen (PERCOCET) 10-325 MG tablet, Take 1 tablet by mouth every 6 (six) hours as needed for pain., Disp: , Rfl:    rizatriptan (MAXALT-MLT) 10 MG disintegrating tablet, Take 1 tablet (10 mg total) by mouth as needed for migraine. May repeat in 2 hours if needed, Disp: 10 tablet, Rfl: 2   Medications ordered in this encounter:  No orders of the defined types were placed in this encounter.    *If you need refills on other medications prior to your next appointment, please contact your pharmacy*  Follow-Up: Call back or seek an in-person evaluation if the symptoms worsen or if the condition fails to improve as anticipated.  [name] [phone]  Other Instructions Try saline nasal  spray and Mucinex (ok to use generic version) to help with congestion.    If you have been instructed to have an in-person evaluation today at a local Urgent Care facility, please use the link below. It will take you to a list of all of our available [facility], including address, phone number and hours of operation. Please do not delay care.  [name]  If you or a family member do not have a primary care provider, use the link below to schedule a visit and establish care. When you choose a [facility] primary care physician or advanced practice provider, you gain a long-term partner in health. Find a Primary Care Provider  Learn more about [facility]'s in-office and virtual care options: [facility] - Get Care Now

## 2023-10-13 NOTE — Progress Notes (Signed)
The patient no-showed for appointment despite this provider sending direct link with no response and waiting for at least 10 minutes from appointment time for patient to join. Patient had logged in early and left. Did not return at appointment time despite links being sent. They will be marked as a NS for this appointment/time.   Margaretann Loveless, PA-C

## 2023-10-14 ENCOUNTER — Ambulatory Visit: Payer: Medicaid Other | Attending: Cardiology | Admitting: Cardiology

## 2023-10-23 ENCOUNTER — Ambulatory Visit (INDEPENDENT_AMBULATORY_CARE_PROVIDER_SITE_OTHER): Payer: Medicaid Other | Admitting: Family Medicine

## 2023-10-23 ENCOUNTER — Encounter: Payer: Self-pay | Admitting: Family Medicine

## 2023-10-23 VITALS — BP 127/66 | HR 91 | Ht 63.0 in | Wt 242.0 lb

## 2023-10-23 DIAGNOSIS — R051 Acute cough: Secondary | ICD-10-CM | POA: Diagnosis not present

## 2023-10-23 DIAGNOSIS — R221 Localized swelling, mass and lump, neck: Secondary | ICD-10-CM

## 2023-10-23 DIAGNOSIS — J069 Acute upper respiratory infection, unspecified: Secondary | ICD-10-CM | POA: Diagnosis not present

## 2023-10-23 NOTE — Progress Notes (Signed)
Acute Office Visit  Subjective:     Patient ID: [name], female    DOB: [birth year], 32 y.o.   MRN: [id number]  Chief Complaint  Patient presents with   Nasal Congestion    HPI Patient is in today for upper respiratory symptoms.  She has had a cough and some drainage she has had a pretty bad headache and some fatigue a little bit of pain in her upper shoulders no chest discomfort no shortness of breath.  She feels like it is a little bit better it started about a week ago.  She was worried about possibility of pneumonia as it is going around the community and 2 of her daughters have been sick recently.  Also noticed a lump on the posterior right neck that is been present for well over a month.  She kept thinking it was a muscle and was trying to massage and she is even been working with a [employer] because she has had just some discomfort in that area.  So she is concerned as it really has not improved or gone away.  ROS      Objective:    BP 127/66   Pulse 91   Ht 5\' 3"  (1.6 m)   Wt 242 lb (109.8 kg)   SpO2 97%   BMI 42.87 kg/m    Physical Exam Constitutional:      Appearance: Normal appearance.  HENT:     Head: Normocephalic and atraumatic.     Right Ear: Tympanic membrane, ear canal and external ear normal. There is no impacted cerumen.     Left Ear: Tympanic membrane, ear canal and external ear normal. There is no impacted cerumen.     Nose: Nose normal.     Mouth/Throat:     Pharynx: Oropharynx is clear. No posterior oropharyngeal erythema.     Comments: Tonsils are enlarged bilaterally but no significant erythema or exudate. Eyes:     Conjunctiva/sclera: Conjunctivae normal.  Cardiovascular:     Rate and Rhythm: Normal rate and regular rhythm.  Pulmonary:     Effort: Pulmonary effort is normal.     Breath sounds: Normal breath sounds.  Musculoskeletal:     Cervical back: Neck supple. No tenderness.     Comments: Does have a palpable knot on  the posterior right neck.  Is a little difficult to tell if it is possibly a lymph node or the muscles tight.  Lymphadenopathy:     Cervical: No cervical adenopathy.  Skin:    General: Skin is warm and dry.  Neurological:     Mental Status: She is alert and oriented to person, place, and time.  Psychiatric:        Mood and Affect: Mood normal.     No results found for any visits on 10/23/23.      Assessment & Plan:   Problem List Items Addressed This Visit   None Visit Diagnoses     Acute cough    -  Primary   Viral upper respiratory tract infection       Neck swelling       Relevant Orders   US Soft Tissue Head/Neck (NON-THYROID)      Upper respiratory illness-most likely viral exam is reassuring today.  Call if not better after the weekend keep well-hydrated use throat lozenges.  Neck swelling-possible lymph node versus a knot in the muscle.  But I do think most likely lymph node will get an ultrasound since been  present for greater than 1 month  No orders of the defined types were placed in this encounter.   No follow-ups on file.  Nani Gasser, MD

## 2023-10-24 ENCOUNTER — Other Ambulatory Visit: Payer: Medicaid Other

## 2023-10-27 ENCOUNTER — Other Ambulatory Visit: Payer: Medicaid Other

## 2023-10-28 ENCOUNTER — Other Ambulatory Visit: Payer: Medicaid Other

## 2023-11-12 DIAGNOSIS — E611 Iron deficiency: Secondary | ICD-10-CM | POA: Diagnosis not present

## 2023-11-12 DIAGNOSIS — Z79899 Other long term (current) drug therapy: Secondary | ICD-10-CM | POA: Diagnosis not present

## 2023-11-12 DIAGNOSIS — R109 Unspecified abdominal pain: Secondary | ICD-10-CM | POA: Diagnosis not present

## 2023-11-12 DIAGNOSIS — E538 Deficiency of other specified B group vitamins: Secondary | ICD-10-CM | POA: Diagnosis not present

## 2023-11-12 DIAGNOSIS — E7800 Pure hypercholesterolemia, unspecified: Secondary | ICD-10-CM | POA: Diagnosis not present

## 2023-11-12 DIAGNOSIS — F17210 Nicotine dependence, cigarettes, uncomplicated: Secondary | ICD-10-CM | POA: Diagnosis not present

## 2023-11-12 DIAGNOSIS — N915 Oligomenorrhea, unspecified: Secondary | ICD-10-CM | POA: Diagnosis not present

## 2023-11-12 DIAGNOSIS — D689 Coagulation defect, unspecified: Secondary | ICD-10-CM | POA: Diagnosis not present

## 2023-11-12 DIAGNOSIS — E559 Vitamin D deficiency, unspecified: Secondary | ICD-10-CM | POA: Diagnosis not present

## 2023-11-12 DIAGNOSIS — Z6841 Body Mass Index (BMI) 40.0 and over, adult: Secondary | ICD-10-CM | POA: Diagnosis not present

## 2023-11-12 DIAGNOSIS — N9489 Other specified conditions associated with female genital organs and menstrual cycle: Secondary | ICD-10-CM | POA: Diagnosis not present

## 2023-11-12 DIAGNOSIS — D539 Nutritional anemia, unspecified: Secondary | ICD-10-CM | POA: Diagnosis not present

## 2023-11-12 DIAGNOSIS — R5383 Other fatigue: Secondary | ICD-10-CM | POA: Diagnosis not present

## 2023-11-17 ENCOUNTER — Telehealth (HOSPITAL_BASED_OUTPATIENT_CLINIC_OR_DEPARTMENT_OTHER): Payer: Self-pay | Admitting: Family Medicine

## 2023-11-18 ENCOUNTER — Other Ambulatory Visit: Payer: Medicaid Other

## 2023-11-18 DIAGNOSIS — Z79899 Other long term (current) drug therapy: Secondary | ICD-10-CM | POA: Diagnosis not present

## 2023-11-20 ENCOUNTER — Other Ambulatory Visit: Payer: Medicaid Other

## 2023-11-21 ENCOUNTER — Ambulatory Visit: Payer: Medicaid Other

## 2023-11-21 DIAGNOSIS — R221 Localized swelling, mass and lump, neck: Secondary | ICD-10-CM

## 2023-11-21 DIAGNOSIS — R590 Localized enlarged lymph nodes: Secondary | ICD-10-CM | POA: Diagnosis not present

## 2023-11-24 NOTE — Progress Notes (Signed)
HI [name], you do have some mildly enlarged lymph nodes but are all under 1 cm so they are considered normal no other worrisome findings or mass.

## 2023-11-25 ENCOUNTER — Ambulatory Visit: Payer: Self-pay | Admitting: Family Medicine

## 2023-11-25 DIAGNOSIS — Z8673 Personal history of transient ischemic attack (TIA), and cerebral infarction without residual deficits: Secondary | ICD-10-CM | POA: Diagnosis not present

## 2023-11-25 DIAGNOSIS — K625 Hemorrhage of anus and rectum: Secondary | ICD-10-CM | POA: Diagnosis not present

## 2023-11-25 DIAGNOSIS — Z86718 Personal history of other venous thrombosis and embolism: Secondary | ICD-10-CM | POA: Diagnosis not present

## 2023-11-25 DIAGNOSIS — Z7901 Long term (current) use of anticoagulants: Secondary | ICD-10-CM | POA: Diagnosis not present

## 2023-11-25 DIAGNOSIS — F17210 Nicotine dependence, cigarettes, uncomplicated: Secondary | ICD-10-CM | POA: Diagnosis not present

## 2023-11-25 NOTE — Telephone Encounter (Signed)
  Chief Complaint: Rectal Bleeding Symptoms: abdominal pain along with rectal bleeding and bloody stools Frequency: started today Pertinent Negatives: Patient denies fever, dizziness Disposition: [x] ED /[] Urgent Care (no appt availability in office) / [] Appointment(In office/virtual)/ []  [facility]/ [] Home Care/ [] Refused Recommended Disposition /[] [facility]/ []  Follow-up with PCP Additional Notes: Patient called with [name]/o rectal bleeding with abdominal pain. Patient reports having a bowel movement and passing a lot of blood. Patient endorses hemorrhoids and has had some blood before but states that today there was a lot of blood. Patient endorses taking Eliquis for a clotting disorder. Per protocol, the recommendation of the ED was given to the patient. Patient verbalizes understanding of the plan. All questions answered.    Copied from CRM [id number]. Topic: Clinical - Red Word Triage >> Nov 25, 2023 10:01 AM [name] wrote: Red Word that prompted transfer to Nurse Triage: Pt [name]/o blood in stool started today, stomach hurts a little bit. Pt denies dizziness, nor fever. Pt wants to speak w/a nurse. [phone] Reason for Disposition  Taking Coumadin (warfarin) or other strong blood thinner, or known bleeding disorder (e.g., thrombocytopenia)  Answer Assessment - Initial Assessment Questions 1. APPEARANCE of BLOOD: "What color is it?" "Is it passed separately, on the surface of the stool, or mixed in with the stool?"      Bright Red 2. AMOUNT: "How much blood was passed?"      unsure 3. FREQUENCY: "How many times has blood been passed with the stools?"      Only once 4. ONSET: "When was the blood first seen in the stools?" (Days or weeks)      today 5. DIARRHEA: "Is there also some diarrhea?" If Yes, ask: "How many diarrhea stools in the past 24 hours?"      no 6. CONSTIPATION: "Do you have constipation?" If Yes, ask: "How bad is it?"     At times 7. RECURRENT  SYMPTOMS: "Have you had blood in your stools before?" If Yes, ask: "When was the last time?" and "What happened that time?"      A few weeks ago 8. BLOOD THINNERS: "Do you take any blood thinners?" (e.g., Coumadin/warfarin, Pradaxa/dabigatran, aspirin)     Eliquis-blood clotting disorder 9. OTHER SYMPTOMS: "Do you have any other symptoms?"  (e.g., abdomen pain, vomiting, dizziness, fever)     Some abdominal pain but suppose to start period today  Protocols used: Rectal Bleeding-A-AH

## 2023-11-26 ENCOUNTER — Other Ambulatory Visit: Payer: Self-pay | Admitting: *Deleted

## 2023-11-26 DIAGNOSIS — D509 Iron deficiency anemia, unspecified: Secondary | ICD-10-CM

## 2023-11-26 DIAGNOSIS — R790 Abnormal level of blood mineral: Secondary | ICD-10-CM

## 2023-11-26 NOTE — Progress Notes (Signed)
Patient informed. 

## 2023-11-27 LAB — TSH: TSH: 1.94 u[IU]/mL (ref 0.450–4.500)

## 2023-11-27 NOTE — Progress Notes (Signed)
Your lab work is within acceptable range and there are no concerning findings.   ?

## 2023-12-02 IMAGING — CR DG CHEST 2V
2 series · 2 of 2 positions shown · non-contrast
Comparison: Chest two views 01/14/2022

CLINICAL DATA: Pain in chest radiating down abdomen and left leg
for a couple of hours.

EXAM:
CHEST - 2 VIEW

[chest pa]
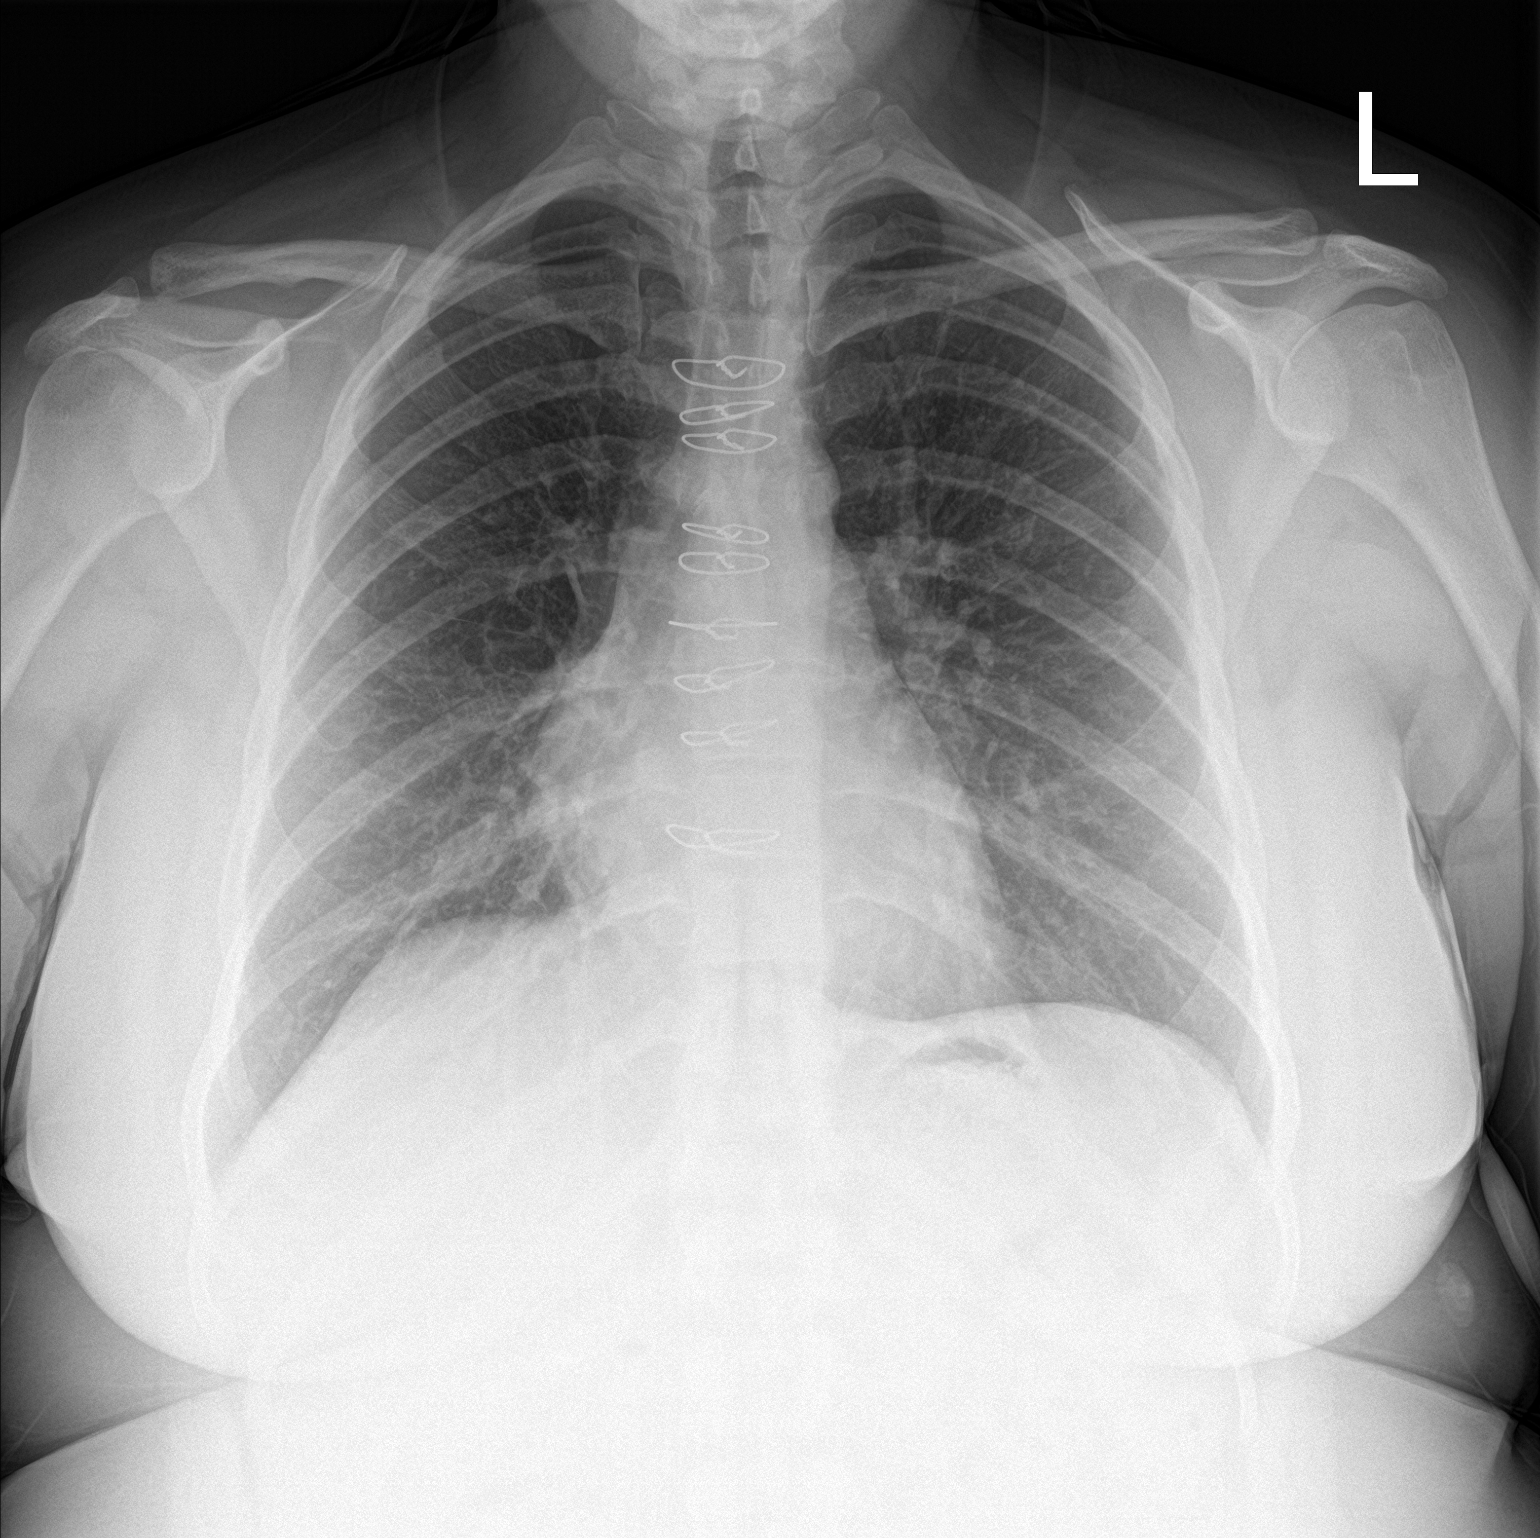

[chest lat]
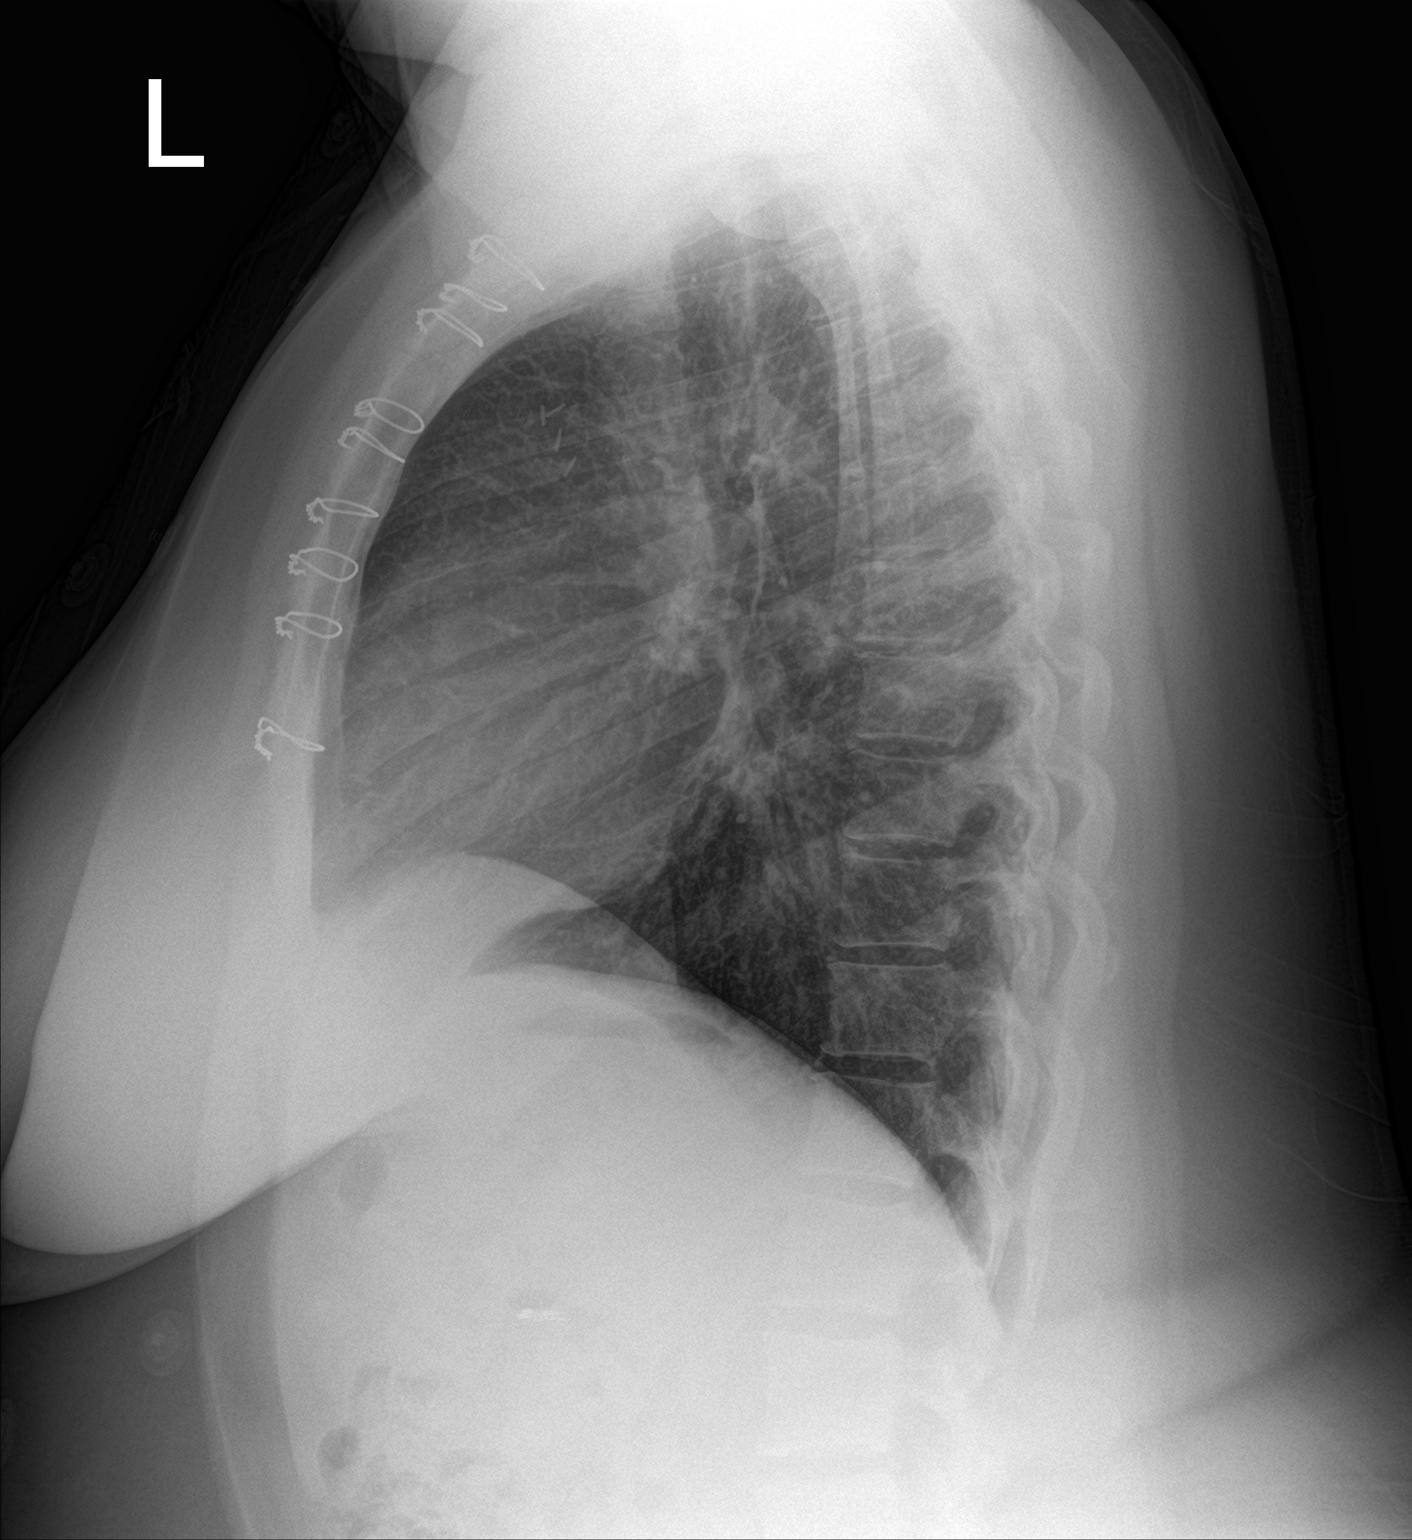

[2 of 2 positions shown; findings below may reference images not displayed]

FINDINGS: Status post median sternotomy. Cardiac silhouette and mediastinal
contours are within normal limits. The lungs are clear. No pleural
effusion or pneumothorax. No acute skeletal abnormality.
Cholecystectomy clips.
IMPRESSION: No active cardiopulmonary disease.

## 2023-12-12 DIAGNOSIS — E538 Deficiency of other specified B group vitamins: Secondary | ICD-10-CM | POA: Diagnosis not present

## 2023-12-12 DIAGNOSIS — E559 Vitamin D deficiency, unspecified: Secondary | ICD-10-CM | POA: Diagnosis not present

## 2023-12-12 DIAGNOSIS — Z79899 Other long term (current) drug therapy: Secondary | ICD-10-CM | POA: Diagnosis not present

## 2023-12-12 DIAGNOSIS — Z6841 Body Mass Index (BMI) 40.0 and over, adult: Secondary | ICD-10-CM | POA: Diagnosis not present

## 2023-12-12 DIAGNOSIS — D689 Coagulation defect, unspecified: Secondary | ICD-10-CM | POA: Diagnosis not present

## 2023-12-12 DIAGNOSIS — E611 Iron deficiency: Secondary | ICD-10-CM | POA: Diagnosis not present

## 2023-12-12 DIAGNOSIS — N915 Oligomenorrhea, unspecified: Secondary | ICD-10-CM | POA: Diagnosis not present

## 2023-12-12 DIAGNOSIS — E7800 Pure hypercholesterolemia, unspecified: Secondary | ICD-10-CM | POA: Diagnosis not present

## 2023-12-12 DIAGNOSIS — N9489 Other specified conditions associated with female genital organs and menstrual cycle: Secondary | ICD-10-CM | POA: Diagnosis not present

## 2023-12-12 DIAGNOSIS — F17210 Nicotine dependence, cigarettes, uncomplicated: Secondary | ICD-10-CM | POA: Diagnosis not present

## 2023-12-30 DIAGNOSIS — M9903 Segmental and somatic dysfunction of lumbar region: Secondary | ICD-10-CM | POA: Diagnosis not present

## 2023-12-30 DIAGNOSIS — M9901 Segmental and somatic dysfunction of cervical region: Secondary | ICD-10-CM | POA: Diagnosis not present

## 2023-12-30 DIAGNOSIS — M542 Cervicalgia: Secondary | ICD-10-CM | POA: Diagnosis not present

## 2023-12-30 DIAGNOSIS — M545 Low back pain, unspecified: Secondary | ICD-10-CM | POA: Diagnosis not present

## 2023-12-30 DIAGNOSIS — M546 Pain in thoracic spine: Secondary | ICD-10-CM | POA: Diagnosis not present

## 2023-12-30 DIAGNOSIS — M9902 Segmental and somatic dysfunction of thoracic region: Secondary | ICD-10-CM | POA: Diagnosis not present

## 2023-12-30 DIAGNOSIS — M9904 Segmental and somatic dysfunction of sacral region: Secondary | ICD-10-CM | POA: Diagnosis not present

## 2023-12-31 DIAGNOSIS — Z6841 Body Mass Index (BMI) 40.0 and over, adult: Secondary | ICD-10-CM | POA: Diagnosis not present

## 2023-12-31 DIAGNOSIS — N9489 Other specified conditions associated with female genital organs and menstrual cycle: Secondary | ICD-10-CM | POA: Diagnosis not present

## 2024-01-09 DIAGNOSIS — R059 Cough, unspecified: Secondary | ICD-10-CM | POA: Diagnosis not present

## 2024-01-09 DIAGNOSIS — E7800 Pure hypercholesterolemia, unspecified: Secondary | ICD-10-CM | POA: Diagnosis not present

## 2024-01-09 DIAGNOSIS — Z6841 Body Mass Index (BMI) 40.0 and over, adult: Secondary | ICD-10-CM | POA: Diagnosis not present

## 2024-01-09 DIAGNOSIS — E538 Deficiency of other specified B group vitamins: Secondary | ICD-10-CM | POA: Diagnosis not present

## 2024-01-09 DIAGNOSIS — D689 Coagulation defect, unspecified: Secondary | ICD-10-CM | POA: Diagnosis not present

## 2024-01-09 DIAGNOSIS — N915 Oligomenorrhea, unspecified: Secondary | ICD-10-CM | POA: Diagnosis not present

## 2024-01-09 DIAGNOSIS — E611 Iron deficiency: Secondary | ICD-10-CM | POA: Diagnosis not present

## 2024-01-09 DIAGNOSIS — Z79899 Other long term (current) drug therapy: Secondary | ICD-10-CM | POA: Diagnosis not present

## 2024-01-09 DIAGNOSIS — F17210 Nicotine dependence, cigarettes, uncomplicated: Secondary | ICD-10-CM | POA: Diagnosis not present

## 2024-01-09 DIAGNOSIS — N9489 Other specified conditions associated with female genital organs and menstrual cycle: Secondary | ICD-10-CM | POA: Diagnosis not present

## 2024-01-27 ENCOUNTER — Encounter: Payer: Medicaid Other | Admitting: Physician Assistant

## 2024-01-28 NOTE — Progress Notes (Signed)
 Erroneous

## 2024-02-03 ENCOUNTER — Ambulatory Visit: Payer: Medicaid Other | Admitting: Family Medicine

## 2024-02-03 NOTE — Progress Notes (Deleted)
   Established Patient Office Visit  Subjective  Patient ID: [name], female    DOB: July 20, 1991  Age: 33 y.o. MRN: [id number]  No chief complaint on file.   HPI  Here for Pelvic Pain -   {History (Optional):23778}  ROS    Objective:     There were no vitals taken for this visit. {Vitals History (Optional):23777}  Physical Exam   No results found for any visits on 02/03/24.  {Labs (Optional):23779}  The ASCVD Risk score (Arnett DK, et al., 2019) failed to calculate for the following reasons:   The 2019 ASCVD risk score is only valid for ages 49 to 13   Risk score cannot be calculated because patient has a medical history suggesting prior/existing ASCVD    Assessment & Plan:   Problem List Items Addressed This Visit   None   No follow-ups on file.    Nani Gasser, MD

## 2024-02-05 DIAGNOSIS — Z79899 Other long term (current) drug therapy: Secondary | ICD-10-CM | POA: Diagnosis not present

## 2024-02-05 DIAGNOSIS — R519 Headache, unspecified: Secondary | ICD-10-CM | POA: Diagnosis not present

## 2024-02-05 DIAGNOSIS — D689 Coagulation defect, unspecified: Secondary | ICD-10-CM | POA: Diagnosis not present

## 2024-02-05 DIAGNOSIS — F17210 Nicotine dependence, cigarettes, uncomplicated: Secondary | ICD-10-CM | POA: Diagnosis not present

## 2024-02-05 DIAGNOSIS — N915 Oligomenorrhea, unspecified: Secondary | ICD-10-CM | POA: Diagnosis not present

## 2024-02-05 DIAGNOSIS — R079 Chest pain, unspecified: Secondary | ICD-10-CM | POA: Diagnosis not present

## 2024-02-05 DIAGNOSIS — N9489 Other specified conditions associated with female genital organs and menstrual cycle: Secondary | ICD-10-CM | POA: Diagnosis not present

## 2024-02-05 DIAGNOSIS — Z6841 Body Mass Index (BMI) 40.0 and over, adult: Secondary | ICD-10-CM | POA: Diagnosis not present

## 2024-02-05 DIAGNOSIS — E7800 Pure hypercholesterolemia, unspecified: Secondary | ICD-10-CM | POA: Diagnosis not present

## 2024-02-05 DIAGNOSIS — E611 Iron deficiency: Secondary | ICD-10-CM | POA: Diagnosis not present

## 2024-03-04 DIAGNOSIS — R079 Chest pain, unspecified: Secondary | ICD-10-CM | POA: Diagnosis not present

## 2024-03-04 DIAGNOSIS — E7800 Pure hypercholesterolemia, unspecified: Secondary | ICD-10-CM | POA: Diagnosis not present

## 2024-03-04 DIAGNOSIS — F17210 Nicotine dependence, cigarettes, uncomplicated: Secondary | ICD-10-CM | POA: Diagnosis not present

## 2024-03-04 DIAGNOSIS — R519 Headache, unspecified: Secondary | ICD-10-CM | POA: Diagnosis not present

## 2024-03-04 DIAGNOSIS — N9489 Other specified conditions associated with female genital organs and menstrual cycle: Secondary | ICD-10-CM | POA: Diagnosis not present

## 2024-03-04 DIAGNOSIS — E611 Iron deficiency: Secondary | ICD-10-CM | POA: Diagnosis not present

## 2024-03-04 DIAGNOSIS — D539 Nutritional anemia, unspecified: Secondary | ICD-10-CM | POA: Diagnosis not present

## 2024-03-04 DIAGNOSIS — N915 Oligomenorrhea, unspecified: Secondary | ICD-10-CM | POA: Diagnosis not present

## 2024-03-04 DIAGNOSIS — Z79899 Other long term (current) drug therapy: Secondary | ICD-10-CM | POA: Diagnosis not present

## 2024-03-04 DIAGNOSIS — D689 Coagulation defect, unspecified: Secondary | ICD-10-CM | POA: Diagnosis not present

## 2024-03-04 DIAGNOSIS — Z6841 Body Mass Index (BMI) 40.0 and over, adult: Secondary | ICD-10-CM | POA: Diagnosis not present

## 2024-03-04 DIAGNOSIS — Z20822 Contact with and (suspected) exposure to covid-19: Secondary | ICD-10-CM | POA: Diagnosis not present

## 2024-03-04 DIAGNOSIS — R0683 Snoring: Secondary | ICD-10-CM | POA: Diagnosis not present

## 2024-03-17 ENCOUNTER — Ambulatory Visit: Payer: Medicaid Other | Admitting: Cardiology

## 2024-03-22 DIAGNOSIS — R0602 Shortness of breath: Secondary | ICD-10-CM | POA: Diagnosis not present

## 2024-03-22 DIAGNOSIS — Z8659 Personal history of other mental and behavioral disorders: Secondary | ICD-10-CM | POA: Diagnosis not present

## 2024-03-22 DIAGNOSIS — Z8673 Personal history of transient ischemic attack (TIA), and cerebral infarction without residual deficits: Secondary | ICD-10-CM | POA: Diagnosis not present

## 2024-03-22 DIAGNOSIS — F17210 Nicotine dependence, cigarettes, uncomplicated: Secondary | ICD-10-CM | POA: Diagnosis not present

## 2024-03-22 DIAGNOSIS — Z7901 Long term (current) use of anticoagulants: Secondary | ICD-10-CM | POA: Diagnosis not present

## 2024-03-22 DIAGNOSIS — Z86718 Personal history of other venous thrombosis and embolism: Secondary | ICD-10-CM | POA: Diagnosis not present

## 2024-03-22 DIAGNOSIS — Z79899 Other long term (current) drug therapy: Secondary | ICD-10-CM | POA: Diagnosis not present

## 2024-03-22 DIAGNOSIS — R0789 Other chest pain: Secondary | ICD-10-CM | POA: Diagnosis not present

## 2024-03-22 DIAGNOSIS — R079 Chest pain, unspecified: Secondary | ICD-10-CM | POA: Diagnosis not present

## 2024-03-22 DIAGNOSIS — F4310 Post-traumatic stress disorder, unspecified: Secondary | ICD-10-CM | POA: Diagnosis not present

## 2024-03-29 ENCOUNTER — Ambulatory Visit: Admitting: Family Medicine

## 2024-04-01 DIAGNOSIS — F17210 Nicotine dependence, cigarettes, uncomplicated: Secondary | ICD-10-CM | POA: Diagnosis not present

## 2024-04-01 DIAGNOSIS — N915 Oligomenorrhea, unspecified: Secondary | ICD-10-CM | POA: Diagnosis not present

## 2024-04-01 DIAGNOSIS — E7800 Pure hypercholesterolemia, unspecified: Secondary | ICD-10-CM | POA: Diagnosis not present

## 2024-04-01 DIAGNOSIS — N390 Urinary tract infection, site not specified: Secondary | ICD-10-CM | POA: Diagnosis not present

## 2024-04-01 DIAGNOSIS — Z79899 Other long term (current) drug therapy: Secondary | ICD-10-CM | POA: Diagnosis not present

## 2024-04-01 DIAGNOSIS — Z6841 Body Mass Index (BMI) 40.0 and over, adult: Secondary | ICD-10-CM | POA: Diagnosis not present

## 2024-04-01 DIAGNOSIS — R079 Chest pain, unspecified: Secondary | ICD-10-CM | POA: Diagnosis not present

## 2024-04-01 DIAGNOSIS — E538 Deficiency of other specified B group vitamins: Secondary | ICD-10-CM | POA: Diagnosis not present

## 2024-04-01 DIAGNOSIS — E611 Iron deficiency: Secondary | ICD-10-CM | POA: Diagnosis not present

## 2024-04-01 DIAGNOSIS — N9489 Other specified conditions associated with female genital organs and menstrual cycle: Secondary | ICD-10-CM | POA: Diagnosis not present

## 2024-04-01 DIAGNOSIS — R519 Headache, unspecified: Secondary | ICD-10-CM | POA: Diagnosis not present

## 2024-04-01 DIAGNOSIS — D689 Coagulation defect, unspecified: Secondary | ICD-10-CM | POA: Diagnosis not present

## 2024-04-20 ENCOUNTER — Ambulatory Visit: Admitting: Cardiology

## 2024-05-01 DIAGNOSIS — F17210 Nicotine dependence, cigarettes, uncomplicated: Secondary | ICD-10-CM | POA: Diagnosis not present

## 2024-05-01 DIAGNOSIS — R519 Headache, unspecified: Secondary | ICD-10-CM | POA: Diagnosis not present

## 2024-05-01 DIAGNOSIS — E538 Deficiency of other specified B group vitamins: Secondary | ICD-10-CM | POA: Diagnosis not present

## 2024-05-01 DIAGNOSIS — E611 Iron deficiency: Secondary | ICD-10-CM | POA: Diagnosis not present

## 2024-05-01 DIAGNOSIS — R079 Chest pain, unspecified: Secondary | ICD-10-CM | POA: Diagnosis not present

## 2024-05-01 DIAGNOSIS — Z1339 Encounter for screening examination for other mental health and behavioral disorders: Secondary | ICD-10-CM | POA: Diagnosis not present

## 2024-05-01 DIAGNOSIS — N9489 Other specified conditions associated with female genital organs and menstrual cycle: Secondary | ICD-10-CM | POA: Diagnosis not present

## 2024-05-01 DIAGNOSIS — N915 Oligomenorrhea, unspecified: Secondary | ICD-10-CM | POA: Diagnosis not present

## 2024-05-01 DIAGNOSIS — Z79899 Other long term (current) drug therapy: Secondary | ICD-10-CM | POA: Diagnosis not present

## 2024-05-01 DIAGNOSIS — Z6841 Body Mass Index (BMI) 40.0 and over, adult: Secondary | ICD-10-CM | POA: Diagnosis not present

## 2024-05-01 DIAGNOSIS — D689 Coagulation defect, unspecified: Secondary | ICD-10-CM | POA: Diagnosis not present

## 2024-05-01 DIAGNOSIS — E7800 Pure hypercholesterolemia, unspecified: Secondary | ICD-10-CM | POA: Diagnosis not present

## 2024-05-31 DIAGNOSIS — N915 Oligomenorrhea, unspecified: Secondary | ICD-10-CM | POA: Diagnosis not present

## 2024-05-31 DIAGNOSIS — R519 Headache, unspecified: Secondary | ICD-10-CM | POA: Diagnosis not present

## 2024-05-31 DIAGNOSIS — R079 Chest pain, unspecified: Secondary | ICD-10-CM | POA: Diagnosis not present

## 2024-05-31 DIAGNOSIS — Z6841 Body Mass Index (BMI) 40.0 and over, adult: Secondary | ICD-10-CM | POA: Diagnosis not present

## 2024-05-31 DIAGNOSIS — E7800 Pure hypercholesterolemia, unspecified: Secondary | ICD-10-CM | POA: Diagnosis not present

## 2024-05-31 DIAGNOSIS — D689 Coagulation defect, unspecified: Secondary | ICD-10-CM | POA: Diagnosis not present

## 2024-05-31 DIAGNOSIS — E538 Deficiency of other specified B group vitamins: Secondary | ICD-10-CM | POA: Diagnosis not present

## 2024-05-31 DIAGNOSIS — F17210 Nicotine dependence, cigarettes, uncomplicated: Secondary | ICD-10-CM | POA: Diagnosis not present

## 2024-05-31 DIAGNOSIS — N9489 Other specified conditions associated with female genital organs and menstrual cycle: Secondary | ICD-10-CM | POA: Diagnosis not present

## 2024-05-31 DIAGNOSIS — R110 Nausea: Secondary | ICD-10-CM | POA: Diagnosis not present

## 2024-05-31 DIAGNOSIS — Z79899 Other long term (current) drug therapy: Secondary | ICD-10-CM | POA: Diagnosis not present

## 2024-05-31 DIAGNOSIS — E611 Iron deficiency: Secondary | ICD-10-CM | POA: Diagnosis not present

## 2024-06-24 ENCOUNTER — Ambulatory Visit
Admission: RE | Admit: 2024-06-24 | Discharge: 2024-06-24 | Disposition: A | Discharge status: Home / Self Care | Source: Ambulatory Visit | Attending: Family Medicine | Admitting: Family Medicine

## 2024-06-24 VITALS — BP 126/84 | HR 114 | Temp 98.4°F | Resp 19

## 2024-06-24 DIAGNOSIS — B349 Viral infection, unspecified: Secondary | ICD-10-CM

## 2024-06-24 DIAGNOSIS — Z20822 Contact with and (suspected) exposure to covid-19: Secondary | ICD-10-CM

## 2024-06-24 LAB — POC SARS CORONAVIRUS 2 AG -  ED: SARS Coronavirus 2 Ag: NEGATIVE

## 2024-06-24 LAB — POCT INFLUENZA A/B
Influenza A, POC: NEGATIVE
Influenza B, POC: NEGATIVE

## 2024-06-24 NOTE — ED Triage Notes (Signed)
 [facility] presents to [facility] with co headache, dizziness, bodyaches and cp for 3 days. 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 Pt reports no otc.

## 2024-06-24 NOTE — ED Provider Notes (Signed)
 [name] CARE    CSN: [id number] Arrival date & time: 06/24/24  1436      History   Chief Complaint Chief Complaint  Patient presents with   Covid Exposure    HPI [name] is a 33 y.o. female.   HPI 33 year old female presents with cough, dizziness, body aches, and chest pain for 3 days and is concerned for COVID 19.  PMH significant for obesity, depression, and tobacco abuse.  Past Medical History:  Diagnosis Date   Asthma    Clotting disorder (HCC)    per 01/09/22 OV note from Dr. Krasowski, pt has some coagulopathy. However extensive hematological work-up has been unrevealing.   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   TIA (transient ischemic attack) 2018   right-sided weakness   Tobacco abuse    UTI (urinary tract infection)     Patient Active Problem List   Diagnosis Date Noted   Syncope and collapse 06/26/2023   Lumbar strain, subsequent encounter 06/03/2023   Screening-pulmonary TB 05/15/2023   Epigastric abdominal pain 06/28/2022   Current every day vaping 04/12/2022   Encounter for chronic pain management 04/04/2022   Constipation 01/08/2022   Gastroesophageal reflux disease without esophagitis 12/26/2021   History of iron deficiency 12/26/2021   COVID 07/2020   Pelvic pain in female 08/11/2018   Abnormal vaginal bleeding 08/05/2018   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   Abnormal uterine and vaginal bleeding, unspecified 04/17/2017   Adnexal fullness 04/17/2017   Lower abdominal pain 04/17/2017   Dysphagia    Paresthesia    Noncompliance    Clotting disorder (HCC)    TIA (transient ischemic attack)  03/17/2017   Right sided weakness 03/17/2017   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   Right leg weakness 10/14/2016   Chest pain 07/07/2016   Numbness on right side 07/07/2016   Depression    PTSD (post-traumatic stress disorder)    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    Tobacco abuse    Thrombocytosis 12/06/2014   PCOS (polycystic ovarian syndrome) 07/20/2014   Migraines 07/20/2014   Anticoagulation management encounter 02/10/2014   DVT (deep venous thrombosis) (HCC) 11/29/2013   Palpitations 05/04/2013   History of PCOS 03/23/2013   History of DVT of lower extremity, 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    Past Surgical History:  Procedure Laterality Date   btl     CARDIAC SURGERY  2014   thrombectomy to remove thrombi in right atrium   CHOLECYSTECTOMY     TEE WITHOUT CARDIOVERSION N/A 07/09/2016   Procedure: TRANSESOPHAGEAL ECHOCARDIOGRAM (TEE);  Surgeon: Salena Negri, MD;  Location: [facility] ENDOSCOPY;  Service: Cardiovascular;  Laterality: N/A;   TEE WITHOUT CARDIOVERSION N/A 03/19/2017   Procedure: TRANSESOPHAGEAL ECHOCARDIOGRAM (TEE);  Surgeon: Salena Negri, MD;  Location: [facility] ENDOSCOPY;  Service: Cardiovascular;  Laterality: N/A;    OB History     Gravida  2   Para  2   Term      Preterm      AB      Living  2  SAB      IAB      Ectopic      Multiple      Live Births  2            Home Medications    Prior to Admission medications   Medication Sig Start Date End Date Taking? Authorizing Provider  apixaban  (ELIQUIS ) 5 MG TABS tablet Take 1 tablet (5 mg total) by mouth 2 (two) times daily. 03/26/17   Abrol, Nayana, MD  ferrous sulfate 325 (65 FE) MG EC tablet Take 325 mg by mouth daily with breakfast.    [provider]  meclizine  (ANTIVERT ) 25 MG tablet Take 25 mg by mouth 3 (three) times daily as needed. 03/22/24   [provider]  naloxone  (NARCAN ) nasal spray 4 mg/0.1 mL Place 1 spray  into the nose once. 10/17/23   [provider]  ondansetron  (ZOFRAN -ODT) 4 MG disintegrating tablet Take 1 tablet (4 mg total) by mouth every 8 (eight) hours as needed for nausea or vomiting. 07/22/23   Gladis Elsie BROCKS, PA-C  oxyCODONE -acetaminophen  (PERCOCET) 10-325 MG tablet Take 1 tablet by mouth every 6 (six) hours as needed for pain.    [provider]    Family History Family History  Problem Relation Age of Onset   Bipolar disorder Mother    Glaucoma Mother    Arthritis Father    Lupus Sister    Healthy Sister    Healthy Sister    Healthy Brother     Social History Social History   Tobacco Use   Smoking status: Some Days    Types: Cigarettes   Smokeless tobacco: Current   Tobacco comments:    Vape   Vaping Use   Vaping status: Every Day  Substance Use Topics   Alcohol use: No   Drug use: No     Allergies   Ceftriaxone, Gabapentin, Levofloxacin, Morphine, Nsaids, and Vicodin [hydrocodone-acetaminophen ]   Review of Systems Review of Systems   Physical Exam Triage Vital Signs ED Triage Vitals  Encounter Vitals Group     BP      Girls Systolic BP Percentile      Girls Diastolic BP Percentile      Boys Systolic BP Percentile      Boys Diastolic BP Percentile      Pulse      Resp      Temp      Temp src      SpO2      Weight      Height      Head Circumference      Peak Flow      Pain Score      Pain Loc      Pain Education      Exclude from Growth Chart    No data found.  Updated Vital Signs BP 126/84   Pulse (!) 114   Temp 98.4 F (36.9 C)   Resp 19   SpO2 98%    Physical Exam Vitals and nursing note reviewed.  Constitutional:      Appearance: Normal appearance. She is obese.  HENT:     Head: Normocephalic and atraumatic.     Right Ear: Tympanic membrane, ear canal and external ear normal.     Left Ear: Tympanic membrane, ear canal and external ear normal.     Mouth/Throat:     Mouth: Mucous membranes are moist.      Pharynx: Oropharynx is clear.  Eyes:  Extraocular Movements: Extraocular movements intact.     Conjunctiva/sclera: Conjunctivae normal.     Pupils: Pupils are equal, round, and reactive to light.  Cardiovascular:     Rate and Rhythm: Normal rate and regular rhythm.     Pulses: Normal pulses.     Heart sounds: Normal heart sounds.  Pulmonary:     Effort: Pulmonary effort is normal.     Breath sounds: Normal breath sounds. No wheezing, rhonchi or rales.  Musculoskeletal:        General: Normal range of motion.     Cervical back: Normal range of motion and neck supple.  Skin:    General: Skin is warm and dry.  Neurological:     General: No focal deficit present.     Mental Status: She is alert and oriented to person, place, and time.      UC Treatments / Results  Labs (all labs ordered are listed, but only abnormal results are displayed) Labs Reviewed  POCT INFLUENZA A/B - Normal  POC SARS CORONAVIRUS 2 AG -  ED    EKG   Radiology No results found.  Procedures Procedures (including critical care time)  Medications Ordered in UC Medications - No data to display  Initial Impression / Assessment and Plan / UC Course  I have reviewed the triage vital signs and the nursing notes.  Pertinent labs & imaging results that were available during my care of the patient were reviewed by me and considered in my medical decision making (see chart for details).     MDM: 1.  Exposure to COVID-19 virus-COVID-19 negative patient advised. 2.  Viral illness-Influenza A/B- patient advised. Advised may take OTC Tylenol  1000 mg every 6 hours for fever (oral temperature greater than 100.3).  Encouraged increase daily water intake to 64 ounces per day while taking this medication.  Advised if symptoms worsen and/or unresolved please follow-up with your PCP or here for further evaluation.  Patient discharged home, hemodynamically stable.  Work note provided to patient per patient  request for Monday, 06/28/2024. Final Clinical Impressions(s) / UC Diagnoses   Final diagnoses:  Exposure to COVID-19 virus  Viral illness     Discharge Instructions      Advised may take OTC Tylenol  1000 mg every 6 hours for fever (oral temperature greater than 100.3).  Encouraged increase daily water intake to 64 ounces per day while taking this medication.  Advised if symptoms worsen and/or unresolved please follow-up with your PCP or here for further evaluation.     ED Prescriptions   None    PDMP not reviewed this encounter.   Teddy Sharper, FNP 06/24/24 1630

## 2024-06-24 NOTE — Discharge Instructions (Addendum)
 Advised may take OTC Tylenol  1000 mg every 6 hours for fever (oral temperature greater than 100.3).  Encouraged increase daily water intake to 64 ounces per day while taking this medication.  Advised if symptoms worsen and/or unresolved please follow-up with your PCP or here for further evaluation.

## 2024-06-29 DIAGNOSIS — E7800 Pure hypercholesterolemia, unspecified: Secondary | ICD-10-CM | POA: Diagnosis not present

## 2024-06-29 DIAGNOSIS — Z6841 Body Mass Index (BMI) 40.0 and over, adult: Secondary | ICD-10-CM | POA: Diagnosis not present

## 2024-06-29 DIAGNOSIS — R519 Headache, unspecified: Secondary | ICD-10-CM | POA: Diagnosis not present

## 2024-06-29 DIAGNOSIS — N915 Oligomenorrhea, unspecified: Secondary | ICD-10-CM | POA: Diagnosis not present

## 2024-06-29 DIAGNOSIS — E538 Deficiency of other specified B group vitamins: Secondary | ICD-10-CM | POA: Diagnosis not present

## 2024-06-29 DIAGNOSIS — D689 Coagulation defect, unspecified: Secondary | ICD-10-CM | POA: Diagnosis not present

## 2024-06-29 DIAGNOSIS — R110 Nausea: Secondary | ICD-10-CM | POA: Diagnosis not present

## 2024-06-29 DIAGNOSIS — R079 Chest pain, unspecified: Secondary | ICD-10-CM | POA: Diagnosis not present

## 2024-06-29 DIAGNOSIS — E611 Iron deficiency: Secondary | ICD-10-CM | POA: Diagnosis not present

## 2024-06-29 DIAGNOSIS — Z79899 Other long term (current) drug therapy: Secondary | ICD-10-CM | POA: Diagnosis not present

## 2024-06-29 DIAGNOSIS — N9489 Other specified conditions associated with female genital organs and menstrual cycle: Secondary | ICD-10-CM | POA: Diagnosis not present

## 2024-06-29 DIAGNOSIS — F17210 Nicotine dependence, cigarettes, uncomplicated: Secondary | ICD-10-CM | POA: Diagnosis not present

## 2024-07-06 ENCOUNTER — Ambulatory Visit: Attending: Cardiology | Admitting: Cardiology

## 2024-07-07 DIAGNOSIS — Z79899 Other long term (current) drug therapy: Secondary | ICD-10-CM | POA: Diagnosis not present

## 2024-07-22 DIAGNOSIS — Z79899 Other long term (current) drug therapy: Secondary | ICD-10-CM | POA: Diagnosis not present

## 2024-07-22 DIAGNOSIS — F17210 Nicotine dependence, cigarettes, uncomplicated: Secondary | ICD-10-CM | POA: Diagnosis not present

## 2024-07-22 DIAGNOSIS — N9489 Other specified conditions associated with female genital organs and menstrual cycle: Secondary | ICD-10-CM | POA: Diagnosis not present

## 2024-07-30 DIAGNOSIS — Z886 Allergy status to analgesic agent status: Secondary | ICD-10-CM | POA: Diagnosis not present

## 2024-07-30 DIAGNOSIS — R7989 Other specified abnormal findings of blood chemistry: Secondary | ICD-10-CM | POA: Diagnosis not present

## 2024-07-30 DIAGNOSIS — R102 Pelvic and perineal pain: Secondary | ICD-10-CM | POA: Diagnosis not present

## 2024-07-30 DIAGNOSIS — H8110 Benign paroxysmal vertigo, unspecified ear: Secondary | ICD-10-CM | POA: Diagnosis not present

## 2024-07-30 DIAGNOSIS — R42 Dizziness and giddiness: Secondary | ICD-10-CM | POA: Diagnosis not present

## 2024-07-30 DIAGNOSIS — Z885 Allergy status to narcotic agent status: Secondary | ICD-10-CM | POA: Diagnosis not present

## 2024-07-30 DIAGNOSIS — R0789 Other chest pain: Secondary | ICD-10-CM | POA: Diagnosis not present

## 2024-07-30 DIAGNOSIS — Z888 Allergy status to other drugs, medicaments and biological substances status: Secondary | ICD-10-CM | POA: Diagnosis not present

## 2024-07-30 DIAGNOSIS — Z7901 Long term (current) use of anticoagulants: Secondary | ICD-10-CM | POA: Diagnosis not present

## 2024-07-30 DIAGNOSIS — Z881 Allergy status to other antibiotic agents status: Secondary | ICD-10-CM | POA: Diagnosis not present

## 2024-07-30 DIAGNOSIS — R079 Chest pain, unspecified: Secondary | ICD-10-CM | POA: Diagnosis not present

## 2024-07-30 DIAGNOSIS — R109 Unspecified abdominal pain: Secondary | ICD-10-CM | POA: Diagnosis not present

## 2024-07-30 DIAGNOSIS — R9431 Abnormal electrocardiogram [ECG] [EKG]: Secondary | ICD-10-CM | POA: Diagnosis not present

## 2024-07-30 DIAGNOSIS — G8929 Other chronic pain: Secondary | ICD-10-CM | POA: Diagnosis not present

## 2024-07-30 DIAGNOSIS — F17210 Nicotine dependence, cigarettes, uncomplicated: Secondary | ICD-10-CM | POA: Diagnosis not present

## 2024-07-30 DIAGNOSIS — R110 Nausea: Secondary | ICD-10-CM | POA: Diagnosis not present

## 2024-08-21 DIAGNOSIS — Z6841 Body Mass Index (BMI) 40.0 and over, adult: Secondary | ICD-10-CM | POA: Diagnosis not present

## 2024-08-21 DIAGNOSIS — R079 Chest pain, unspecified: Secondary | ICD-10-CM | POA: Diagnosis not present

## 2024-08-21 DIAGNOSIS — Z79899 Other long term (current) drug therapy: Secondary | ICD-10-CM | POA: Diagnosis not present

## 2024-08-21 DIAGNOSIS — E559 Vitamin D deficiency, unspecified: Secondary | ICD-10-CM | POA: Diagnosis not present

## 2024-08-21 DIAGNOSIS — D539 Nutritional anemia, unspecified: Secondary | ICD-10-CM | POA: Diagnosis not present

## 2024-08-21 DIAGNOSIS — Z0000 Encounter for general adult medical examination without abnormal findings: Secondary | ICD-10-CM | POA: Diagnosis not present

## 2024-08-21 DIAGNOSIS — F17210 Nicotine dependence, cigarettes, uncomplicated: Secondary | ICD-10-CM | POA: Diagnosis not present

## 2024-08-21 DIAGNOSIS — E7800 Pure hypercholesterolemia, unspecified: Secondary | ICD-10-CM | POA: Diagnosis not present

## 2024-08-21 DIAGNOSIS — E538 Deficiency of other specified B group vitamins: Secondary | ICD-10-CM | POA: Diagnosis not present

## 2024-08-21 DIAGNOSIS — R110 Nausea: Secondary | ICD-10-CM | POA: Diagnosis not present

## 2024-08-21 DIAGNOSIS — R519 Headache, unspecified: Secondary | ICD-10-CM | POA: Diagnosis not present

## 2024-08-21 DIAGNOSIS — N9489 Other specified conditions associated with female genital organs and menstrual cycle: Secondary | ICD-10-CM | POA: Diagnosis not present

## 2024-08-21 DIAGNOSIS — E611 Iron deficiency: Secondary | ICD-10-CM | POA: Diagnosis not present

## 2024-08-21 DIAGNOSIS — D689 Coagulation defect, unspecified: Secondary | ICD-10-CM | POA: Diagnosis not present

## 2024-08-21 DIAGNOSIS — N915 Oligomenorrhea, unspecified: Secondary | ICD-10-CM | POA: Diagnosis not present

## 2024-09-22 DIAGNOSIS — D689 Coagulation defect, unspecified: Secondary | ICD-10-CM | POA: Diagnosis not present

## 2024-09-22 DIAGNOSIS — M79605 Pain in left leg: Secondary | ICD-10-CM | POA: Diagnosis not present

## 2024-09-22 DIAGNOSIS — N915 Oligomenorrhea, unspecified: Secondary | ICD-10-CM | POA: Diagnosis not present

## 2024-09-22 DIAGNOSIS — E7800 Pure hypercholesterolemia, unspecified: Secondary | ICD-10-CM | POA: Diagnosis not present

## 2024-09-22 DIAGNOSIS — N9489 Other specified conditions associated with female genital organs and menstrual cycle: Secondary | ICD-10-CM | POA: Diagnosis not present

## 2024-09-22 DIAGNOSIS — F17210 Nicotine dependence, cigarettes, uncomplicated: Secondary | ICD-10-CM | POA: Diagnosis not present

## 2024-09-22 DIAGNOSIS — Z6841 Body Mass Index (BMI) 40.0 and over, adult: Secondary | ICD-10-CM | POA: Diagnosis not present

## 2024-09-22 DIAGNOSIS — G43909 Migraine, unspecified, not intractable, without status migrainosus: Secondary | ICD-10-CM | POA: Diagnosis not present

## 2024-09-22 DIAGNOSIS — M79604 Pain in right leg: Secondary | ICD-10-CM | POA: Diagnosis not present

## 2024-09-22 DIAGNOSIS — R519 Headache, unspecified: Secondary | ICD-10-CM | POA: Diagnosis not present

## 2024-09-22 DIAGNOSIS — E538 Deficiency of other specified B group vitamins: Secondary | ICD-10-CM | POA: Diagnosis not present

## 2024-10-20 DIAGNOSIS — G43909 Migraine, unspecified, not intractable, without status migrainosus: Secondary | ICD-10-CM | POA: Diagnosis not present

## 2024-10-20 DIAGNOSIS — E538 Deficiency of other specified B group vitamins: Secondary | ICD-10-CM | POA: Diagnosis not present

## 2024-10-20 DIAGNOSIS — M79605 Pain in left leg: Secondary | ICD-10-CM | POA: Diagnosis not present

## 2024-10-20 DIAGNOSIS — Z6841 Body Mass Index (BMI) 40.0 and over, adult: Secondary | ICD-10-CM | POA: Diagnosis not present

## 2024-10-20 DIAGNOSIS — R0602 Shortness of breath: Secondary | ICD-10-CM | POA: Diagnosis not present

## 2024-10-20 DIAGNOSIS — N9489 Other specified conditions associated with female genital organs and menstrual cycle: Secondary | ICD-10-CM | POA: Diagnosis not present

## 2024-10-20 DIAGNOSIS — N915 Oligomenorrhea, unspecified: Secondary | ICD-10-CM | POA: Diagnosis not present

## 2024-10-20 DIAGNOSIS — D689 Coagulation defect, unspecified: Secondary | ICD-10-CM | POA: Diagnosis not present

## 2024-10-20 DIAGNOSIS — M79604 Pain in right leg: Secondary | ICD-10-CM | POA: Diagnosis not present

## 2024-10-20 DIAGNOSIS — Z79899 Other long term (current) drug therapy: Secondary | ICD-10-CM | POA: Diagnosis not present

## 2024-10-20 DIAGNOSIS — F17210 Nicotine dependence, cigarettes, uncomplicated: Secondary | ICD-10-CM | POA: Diagnosis not present

## 2024-10-20 DIAGNOSIS — E7800 Pure hypercholesterolemia, unspecified: Secondary | ICD-10-CM | POA: Diagnosis not present

## 2024-11-17 DIAGNOSIS — E7800 Pure hypercholesterolemia, unspecified: Secondary | ICD-10-CM | POA: Diagnosis not present

## 2024-11-17 DIAGNOSIS — N9489 Other specified conditions associated with female genital organs and menstrual cycle: Secondary | ICD-10-CM | POA: Diagnosis not present

## 2024-11-17 DIAGNOSIS — Z6841 Body Mass Index (BMI) 40.0 and over, adult: Secondary | ICD-10-CM | POA: Diagnosis not present

## 2024-11-17 DIAGNOSIS — N915 Oligomenorrhea, unspecified: Secondary | ICD-10-CM | POA: Diagnosis not present

## 2024-11-17 DIAGNOSIS — D689 Coagulation defect, unspecified: Secondary | ICD-10-CM | POA: Diagnosis not present

## 2024-11-17 DIAGNOSIS — F17210 Nicotine dependence, cigarettes, uncomplicated: Secondary | ICD-10-CM | POA: Diagnosis not present

## 2024-11-17 DIAGNOSIS — D539 Nutritional anemia, unspecified: Secondary | ICD-10-CM | POA: Diagnosis not present

## 2024-11-17 DIAGNOSIS — E538 Deficiency of other specified B group vitamins: Secondary | ICD-10-CM | POA: Diagnosis not present

## 2024-11-17 DIAGNOSIS — G43909 Migraine, unspecified, not intractable, without status migrainosus: Secondary | ICD-10-CM | POA: Diagnosis not present

## 2024-11-17 DIAGNOSIS — Z79899 Other long term (current) drug therapy: Secondary | ICD-10-CM | POA: Diagnosis not present

## 2024-12-27 ENCOUNTER — Ambulatory Visit

## 2024-12-27 ENCOUNTER — Ambulatory Visit
Admission: RE | Admit: 2024-12-27 | Discharge: 2024-12-27 | Disposition: A | Discharge status: Home / Self Care | Attending: Family Medicine | Admitting: Family Medicine

## 2024-12-27 VITALS — BP 124/84 | HR 77 | Temp 99.2°F | Resp 18 | Ht 63.0 in | Wt 244.0 lb

## 2024-12-27 DIAGNOSIS — M25421 Effusion, right elbow: Secondary | ICD-10-CM

## 2024-12-27 NOTE — Discharge Instructions (Addendum)
 Advised patient to RICE affected area of right elbow for 30 minutes 3 times daily for the next 3 days advised patient of right elbow x-ray with hardcopy provided.  Advised if symptoms worsen and/or unresolved please follow-up with your PCP or [facility] orthopedics for further evaluation.

## 2024-12-27 NOTE — ED Triage Notes (Signed)
 Patient states that she's had swelling in her right elbow/lower arm x 3 weeks.  No apparent injury.  Patient did go to the ED when she first noticed due to red streaking, concerned for blood clots.  Everything was normal.  F/u w/PCP on 01/17/2025.

## 2024-12-27 NOTE — ED Provider Notes (Signed)
 " [name] CARE    CSN: [id number] Arrival date & time: 12/27/24  1450      History   Chief Complaint Chief Complaint  Patient presents with   Arm Swelling    HPI [name] is a 34 y.o. female.   HPI 34 year old female presents with right elbow pain and swelling x 3 weeks patient was evaluated at [facility] ED on 12/06/2024 reports having  ultrasound performed on arm to rule out thrombus.  Please see epic for that encounter note PMH significant for morbid obesity, TIA, and clotting disorder.  Patient reports that she has a follow-up with her PCP on 01/17/2025.  Past Medical History:  Diagnosis Date   Asthma    Clotting disorder    per 01/09/22 OV note from Dr. Krasowski, pt has some coagulopathy. However extensive hematological work-up has been unrevealing.   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   TIA (transient ischemic attack) 2018   right-sided weakness   Tobacco abuse    UTI (urinary tract infection)     Patient Active Problem List   Diagnosis Date Noted   Syncope and collapse 06/26/2023   Lumbar strain, subsequent encounter 06/03/2023   Screening-pulmonary TB 05/15/2023   Epigastric abdominal pain 06/28/2022   Current every day vaping 04/12/2022   Encounter for chronic pain management 04/04/2022   Constipation 01/08/2022   Gastroesophageal reflux disease without esophagitis 12/26/2021   History of iron deficiency 12/26/2021   COVID 07/2020   Pelvic pain in female 08/11/2018   Abnormal vaginal bleeding 08/05/2018   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   Abnormal uterine and vaginal bleeding, unspecified  04/17/2017   Adnexal fullness 04/17/2017   Lower abdominal pain 04/17/2017   Dysphagia    Paresthesia    Noncompliance    Clotting disorder    TIA (transient ischemic attack) 03/17/2017   Right sided weakness 03/17/2017   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   Right leg weakness 10/14/2016   Chest pain 07/07/2016   Numbness on right side 07/07/2016   Depression    PTSD (post-traumatic stress disorder)    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    Tobacco abuse    Thrombocytosis 12/06/2014   PCOS (polycystic ovarian syndrome) 07/20/2014   Migraines 07/20/2014   Anticoagulation management encounter 02/10/2014   DVT (deep venous thrombosis) (HCC) 11/29/2013   Palpitations 05/04/2013   History of PCOS 03/23/2013   History of DVT of lower extremity, 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    Past Surgical History:  Procedure Laterality Date   btl     CARDIAC SURGERY  2014   thrombectomy to remove thrombi in right atrium   CHOLECYSTECTOMY     TEE WITHOUT CARDIOVERSION N/A 07/09/2016   Procedure: TRANSESOPHAGEAL ECHOCARDIOGRAM (TEE);  Surgeon: Salena Negri, MD;  Location: [facility] ENDOSCOPY;  Service: Cardiovascular;  Laterality: N/A;   TEE WITHOUT CARDIOVERSION N/A 03/19/2017   Procedure: TRANSESOPHAGEAL ECHOCARDIOGRAM (TEE);  Surgeon: Salena Negri, MD;  Location: [facility] ENDOSCOPY;  Service: Cardiovascular;  Laterality: N/A;    OB History  Gravida  2   Para  2   Term      Preterm      AB      Living  2      SAB      IAB      Ectopic      Multiple      Live Births  2            Home Medications    Prior to Admission medications  Medication Sig Start Date End Date Taking? Authorizing Provider  apixaban  (ELIQUIS ) 5 MG TABS tablet Take 1 tablet (5 mg total) by mouth 2 (two) times daily. 03/26/17  Yes Abrol, Nayana, MD  ferrous sulfate 325 (65 FE) MG EC tablet Take 325 mg by mouth daily with breakfast.   Yes [provider]  meclizine  (ANTIVERT ) 25 MG tablet Take 25 mg by mouth 3 (three) times daily as needed. 03/22/24  Yes [provider]  ondansetron  (ZOFRAN -ODT) 4 MG disintegrating tablet Take 1 tablet (4 mg total) by mouth every 8 (eight) hours as needed for nausea or vomiting. 07/22/23  Yes Gladis Elsie BROCKS, PA-C  oxyCODONE -acetaminophen  (PERCOCET) 10-325 MG tablet Take 1 tablet by mouth every 6 (six) hours as needed for pain.   Yes [provider]  naloxone  (NARCAN ) nasal spray 4 mg/0.1 mL Place 1 spray into the nose once. 10/17/23   [provider]    Family History Family History  Problem Relation Age of Onset   Bipolar disorder Mother    Glaucoma Mother    Arthritis Father    Lupus Sister    Healthy Sister    Healthy Sister    Healthy Brother     Social History Social History[1]   Allergies   Ceftriaxone, Gabapentin, Levofloxacin, Morphine, Nsaids, and Vicodin [hydrocodone-acetaminophen ]   Review of Systems Review of Systems  Musculoskeletal:        Right elbow pain and swelling for 3 weeks     Physical Exam Triage Vital Signs ED Triage Vitals  Encounter Vitals Group     BP      Girls Systolic BP Percentile      Girls Diastolic BP Percentile      Boys Systolic BP Percentile      Boys Diastolic BP Percentile      Pulse      Resp      Temp      Temp src      SpO2      Weight      Height      Head Circumference      Peak Flow      Pain Score      Pain Loc      Pain Education      Exclude from Growth Chart    No data found.  Updated Vital Signs BP 124/84 (BP Location: Left Arm)   Pulse 77   Temp 99.2 F (37.3 C) (Oral)   Resp 18   Ht 5' 3 (1.6 m)   Wt 244 lb (110.7 kg)   LMP 12/07/2024 (Exact Date)   SpO2 95%   BMI 43.22 kg/m   Visual Acuity Right Eye Distance:   Left Eye Distance:   Bilateral Distance:    Right Eye Near:   Left Eye Near:    Bilateral Near:     Physical Exam Vitals and nursing note reviewed.   Constitutional:      Appearance: Normal appearance. She is normal  weight.  HENT:     Head: Normocephalic and atraumatic.     Mouth/Throat:     Mouth: Mucous membranes are moist.     Pharynx: Oropharynx is clear.  Eyes:     Extraocular Movements: Extraocular movements intact.     Conjunctiva/sclera: Conjunctivae normal.     Pupils: Pupils are equal, round, and reactive to light.  Cardiovascular:     Rate and Rhythm: Normal rate and regular rhythm.     Heart sounds: Normal heart sounds.  Pulmonary:     Effort: Pulmonary effort is normal.     Breath sounds: Normal breath sounds. No wheezing, rhonchi or rales.  Musculoskeletal:        General: Normal range of motion.     Comments: Right elbow: Moderate soft tissue swelling noted-please see image below  Skin:    General: Skin is warm and dry.  Neurological:     General: No focal deficit present.     Mental Status: She is alert and oriented to person, place, and time. Mental status is at baseline.  Psychiatric:        Mood and Affect: Mood normal.        Behavior: Behavior normal.      UC Treatments / Results  Labs (all labs ordered are listed, but only abnormal results are displayed) Labs Reviewed - No data to display  EKG   Radiology DG Elbow Complete Right Result Date: 12/27/2024 CLINICAL DATA:  Swelling EXAM: RIGHT ELBOW - COMPLETE 3+ VIEW COMPARISON:  None Available. FINDINGS: There is no evidence of fracture, dislocation, or joint effusion. There is no evidence of arthropathy or other focal bone abnormality. Soft tissues are unremarkable. IMPRESSION: Negative. Electronically Signed   By: Luke Bun M.D.   On: 12/27/2024 17:01    Procedures Procedures (including critical care time)  Medications Ordered in UC Medications - No data to display  Initial Impression / Assessment and Plan / UC Course  I have reviewed the triage vital signs and the nursing notes.  Pertinent labs & imaging results that were available  during my care of the patient were reviewed by me and considered in my medical decision making (see chart for details).     MDM: 1.  Elbow swelling, right-advised patient of right elbow x-ray results with hardcopy provided.  Encouraged patient to wear right elbow sling.  Advised patient to RICE affected area of right elbow for 30 minutes 3 times daily for the next 3 days advised patient of right elbow x-ray with hardcopy provided.  Advised if symptoms worsen and/or unresolved please follow-up with your PCP or [facility] orthopedics for further evaluation.  Work note provided to patient per request prior to discharge.  Final Clinical Impressions(s) / UC Diagnoses   Final diagnoses:  Elbow swelling, right     Discharge Instructions      Advised patient to RICE affected area of right elbow for 30 minutes 3 times daily for the next 3 days advised patient of right elbow x-ray with hardcopy provided.  Advised if symptoms worsen and/or unresolved please follow-up with your PCP or [facility] orthopedics for further evaluation.     ED Prescriptions   None    I have reviewed the PDMP during this encounter.     [1]  Social History Tobacco Use   Smoking status: Some Days    Types: Cigarettes   Smokeless tobacco: Current   Tobacco comments:    Vape   Vaping Use  Vaping status: Every Day  Substance Use Topics   Alcohol use: No   Drug use: No     Teddy Sharper, FNP 12/27/24 1733  "

## 2024-12-28 ENCOUNTER — Ambulatory Visit: Admitting: Family Medicine

## 2024-12-28 ENCOUNTER — Telehealth: Payer: Self-pay

## 2024-12-28 NOTE — Telephone Encounter (Signed)
 Patient called needing new work note to specify work restrictions by provider due to elbow injury. New note sent through mychart stating providers restrictions for work until 01/03/25.

## 2025-01-05 ENCOUNTER — Telehealth: Admitting: Emergency Medicine

## 2025-01-05 DIAGNOSIS — M7989 Other specified soft tissue disorders: Secondary | ICD-10-CM | POA: Diagnosis not present

## 2025-01-05 NOTE — Patient Instructions (Signed)
" °  [name], thank you for joining [name], NP for today's virtual visit.  While this provider is not your primary care provider (PCP), if your PCP is located in our provider database this encounter information will be shared with them immediately following your visit.   A [facility] MyChart account gives you access to today's visit and all your visits, tests, and labs performed at [facility]  click here if you don't have a [facility] MyChart account or go to [email or web address]  Consent: (Patient) [name] provided verbal consent for this virtual visit at the beginning of the encounter.  Current Medications:  Current Outpatient Medications:    apixaban  (ELIQUIS ) 5 MG TABS tablet, Take 1 tablet (5 mg total) by mouth 2 (two) times daily., Disp: 60 tablet, Rfl: 1   ferrous sulfate 325 (65 FE) MG EC tablet, Take 325 mg by mouth daily with breakfast., Disp: , Rfl:    meclizine  (ANTIVERT ) 25 MG tablet, Take 25 mg by mouth 3 (three) times daily as needed., Disp: , Rfl:    naloxone  (NARCAN ) nasal spray 4 mg/0.1 mL, Place 1 spray into the nose once., Disp: , Rfl:    ondansetron  (ZOFRAN -ODT) 4 MG disintegrating tablet, Take 1 tablet (4 mg total) by mouth every 8 (eight) hours as needed for nausea or vomiting., Disp: 20 tablet, Rfl: 0   oxyCODONE -acetaminophen  (PERCOCET) 10-325 MG tablet, Take 1 tablet by mouth every 6 (six) hours as needed for pain., Disp: , Rfl:    Medications ordered in this encounter:  No orders of the defined types were placed in this encounter.    *If you need refills on other medications prior to your next appointment, please contact your pharmacy*  Follow-Up: Call back or seek an in-person evaluation if the symptoms worsen or if the condition fails to improve as anticipated.  [facility] [phone]   If you have been instructed to have an in-person evaluation today at a local Urgent Care facility, please  use the link below. It will take you to a list of all of our available [facility], including address, phone number and hours of operation. Please do not delay care.  [facility]  If you or a family member do not have a primary care provider, use the link below to schedule a visit and establish care. When you choose a [facility] primary care physician or advanced practice provider, you gain a long-term partner in health. Find a Primary Care Provider  Learn more about [facility]'s in-office and virtual care options: [facility] - Get Care Now  "
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
## 2025-01-06 ENCOUNTER — Ambulatory Visit: Admitting: Family Medicine

## 2025-01-06 ENCOUNTER — Encounter: Payer: Self-pay | Admitting: Family Medicine

## 2025-01-06 VITALS — BP 127/57 | HR 101 | Ht 63.0 in | Wt 242.0 lb

## 2025-01-06 DIAGNOSIS — M79601 Pain in right arm: Secondary | ICD-10-CM | POA: Diagnosis not present

## 2025-01-06 DIAGNOSIS — B001 Herpesviral vesicular dermatitis: Secondary | ICD-10-CM

## 2025-01-06 DIAGNOSIS — M7711 Lateral epicondylitis, right elbow: Secondary | ICD-10-CM

## 2025-01-06 MED ORDER — DICLOFENAC SODIUM 1 % EX GEL: 2.0000 g | Freq: Four times a day (QID) | CUTANEOUS | 1 refills | Status: AC

## 2025-01-06 MED ORDER — VALACYCLOVIR HCL 1 G PO TABS: ORAL_TABLET | ORAL | 1 refills | Status: AC

## 2025-01-06 NOTE — Progress Notes (Signed)
 "  Acute Office Visit  Patient ID: [name], female    DOB: Nov 10, 1991, 34 y.o.   MRN: [id number]  PCP: Alvan Dorothyann BIRCH, MD  Chief Complaint  Patient presents with   Arm Pain    Pt reports that her R arm pain started before Christmas. The pain is 7/10, constant, she has limited ROM, its hard for her to hold things and her arm is still swelling. She has been using ice, heat and pain meds, she stated that there was a referral placed but hasn't heard anything.    Mouth Lesions    R sided corner of mouth     Subjective:     HPI  Discussed the use of AI scribe software for clinical note transcription with the patient, who gave verbal consent to proceed.  History of Present Illness [name] is a 34 year old female who presents with persistent elbow pain and swelling.  Right elbow pain and swelling - Onset approximately four weeks ago, beginning around the week of Christmas - Initial symptom of pain followed by swelling and erythema of the elbow - Pain localized from a specific spot on the lateral elbow radiating down to the forearm - Significant swelling present in the affected area - Severe pain with palpation of a particular spot on the elbow, described as 'seeing stars' - Difficulty gripping objects; able to hold items placed in hand but unable to lift objects such as a water bottle from a table - Unable to make a full fist - No improvement with rest, ice, elevation, and use of a full arm sling for thirty days - Compression with Ace bandage intolerable due to increased pain - Unable to take oral NSAIDs (ibuprofen, Aleve) due to intolerance - Ultrasound of the affected side from the neck down was negative  Functional impairment - Work restrictions implemented due to occupation as a engineer, 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  Constitutional:      Appearance: Normal appearance.  HENT:     Head: Normocephalic.  Pulmonary:     Effort: Pulmonary effort is normal.  Musculoskeletal:     Comments: Very tender over the right lateral epicondyle.  Pain with supination against resistance.  Elbow with NROM and strength. Nontender over the test of the elbow.     Skin:    Comments: Cold sore on right side of mouth   Neurological:     Mental Status: She is alert and oriented to person, place, and time.  Psychiatric:        Mood and Affect: Mood normal.        Behavior: Behavior normal.       No results found for any visits on 01/06/25.     Assessment & Plan:   Problem List Items Addressed This  Visit   None Visit Diagnoses       Right arm pain    -  Primary     Right lateral epicondylitis       Relevant Medications   diclofenac  Sodium (VOLTAREN  ARTHRITIS PAIN) 1 % GEL   Other Relevant Orders   Ambulatory referral to Sports Medicine     Cold sore       Relevant Medications   valACYclovir  (VALTREX ) 1000 MG tablet       Assessment and Plan Assessment & Plan Lateral epicondylitis, right elbow Chronic pain and swelling localized to the lateral epicondyle, consistent with lateral epicondylitis. No complete tendon tear. Likely due to repetitive work activities. - Recommended topical NSAID (diclofenac  gel). - Advised use of an elbow strap. - Provided rehabilitation exercises, starting with range of motion before adding any weight.  - Consider steroid  injection if no improvement. - Referred to sports medicine specialist.  Oral herpes simplex infection New onset oral herpes simplex with lip blisters, likely triggered by fatigue and dry skin. - Prescribed valacyclovir , two tablets every 12 hours for four tablets total. - Advised on hygiene measures to prevent spread. - Instructed to sanitize personal items. - Provided extra valacyclovir  for future outbreaks.    Meds ordered this encounter  Medications   diclofenac  Sodium (VOLTAREN  ARTHRITIS PAIN) 1 % GEL    Sig: Apply 2 g topically 4 (four) times daily.    Dispense:  50 g    Refill:  1   valACYclovir  (VALTREX ) 1000 MG tablet    Sig: 2 tabs by mouth twice a day for one day as needed for outbreak.    Dispense:  24 tablet    Refill:  1    No follow-ups on file.  Dorothyann Byars, MD [facility]   "

## 2025-01-06 NOTE — Patient Instructions (Signed)
 Recommend tennis elbow strap.

## 2025-01-10 ENCOUNTER — Encounter: Payer: Self-pay | Admitting: Family Medicine

## 2025-01-10 DIAGNOSIS — E282 Polycystic ovarian syndrome: Secondary | ICD-10-CM

## 2025-01-11 NOTE — Addendum Note (Signed)
 Addended byBETHA DUWAINE RIGGS on: 01/11/2025 12:00 PM   Modules accepted: Orders

## 2025-01-13 ENCOUNTER — Ambulatory Visit

## 2025-01-13 ENCOUNTER — Institutional Professional Consult (permissible substitution): Admitting: Family Medicine

## 2025-01-17 ENCOUNTER — Ambulatory Visit

## 2025-01-19 ENCOUNTER — Ambulatory Visit: Admitting: Family Medicine

## 2025-01-27 ENCOUNTER — Institutional Professional Consult (permissible substitution): Admitting: Family Medicine
